# Patient Record
Sex: Male | Born: 1960
Health system: Southern US, Community
[De-identification: ages and names within clinical notes are randomized; demographics above are authoritative.]

## PROBLEM LIST (undated history)

## (undated) DIAGNOSIS — M1990 Unspecified osteoarthritis, unspecified site: Secondary | ICD-10-CM

## (undated) DIAGNOSIS — L723 Sebaceous cyst: Secondary | ICD-10-CM

## (undated) DIAGNOSIS — E785 Hyperlipidemia, unspecified: Secondary | ICD-10-CM

## (undated) DIAGNOSIS — R5383 Other fatigue: Secondary | ICD-10-CM

## (undated) DIAGNOSIS — M159 Polyosteoarthritis, unspecified: Secondary | ICD-10-CM

## (undated) DIAGNOSIS — K759 Inflammatory liver disease, unspecified: Secondary | ICD-10-CM

## (undated) DIAGNOSIS — F17200 Nicotine dependence, unspecified, uncomplicated: Secondary | ICD-10-CM

## (undated) DIAGNOSIS — I10 Essential (primary) hypertension: Secondary | ICD-10-CM

## (undated) DIAGNOSIS — R7303 Prediabetes: Secondary | ICD-10-CM

## (undated) DIAGNOSIS — Q790 Congenital diaphragmatic hernia: Secondary | ICD-10-CM

## (undated) DIAGNOSIS — L309 Dermatitis, unspecified: Secondary | ICD-10-CM

## (undated) HISTORY — DX: Congenital diaphragmatic hernia: Q79.0

## (undated) HISTORY — DX: Polyosteoarthritis, unspecified: M15.9

## (undated) HISTORY — DX: Nicotine dependence, unspecified, uncomplicated: F17.200

## (undated) HISTORY — DX: Sebaceous cyst: L72.3

## (undated) HISTORY — DX: Other fatigue: R53.83

## (undated) HISTORY — DX: Unspecified osteoarthritis, unspecified site: M19.90

## (undated) HISTORY — DX: Dermatitis, unspecified: L30.9

## (undated) HISTORY — PX: COLONOSCOPY: SHX174

## (undated) HISTORY — DX: Hyperlipidemia, unspecified: E78.5

## (undated) HISTORY — DX: Essential (primary) hypertension: I10

## (undated) HISTORY — PX: OTHER SURGICAL HISTORY: SHX169

---

## 2010-05-18 ENCOUNTER — Emergency Department (HOSPITAL_COMMUNITY)
Admission: EM | Admit: 2010-05-18 | Discharge: 2010-05-18 | Payer: Self-pay | Source: Home / Self Care | Admitting: Emergency Medicine

## 2010-08-06 LAB — RAPID URINE DRUG SCREEN, HOSP PERFORMED
Amphetamines: NOT DETECTED
Cocaine: POSITIVE — AB
Opiates: POSITIVE — AB

## 2010-08-06 LAB — COMPREHENSIVE METABOLIC PANEL
AST: 30 U/L (ref 0–37)
Albumin: 4.6 g/dL (ref 3.5–5.2)
Alkaline Phosphatase: 65 U/L (ref 39–117)
Chloride: 105 mEq/L (ref 96–112)
Creatinine, Ser: 1.37 mg/dL (ref 0.4–1.5)
Sodium: 137 mEq/L (ref 135–145)
Total Bilirubin: 0.6 mg/dL (ref 0.3–1.2)

## 2010-08-06 LAB — DIFFERENTIAL
Eosinophils Relative: 0 % (ref 0–5)
Lymphocytes Relative: 4 % — ABNORMAL LOW (ref 12–46)
Monocytes Absolute: 1.3 10*3/uL — ABNORMAL HIGH (ref 0.1–1.0)
Monocytes Relative: 6 % (ref 3–12)
Neutrophils Relative %: 90 % — ABNORMAL HIGH (ref 43–77)

## 2010-08-06 LAB — URINALYSIS, ROUTINE W REFLEX MICROSCOPIC
Bilirubin Urine: NEGATIVE
Glucose, UA: NEGATIVE mg/dL
Protein, ur: NEGATIVE mg/dL
Specific Gravity, Urine: 1.019 (ref 1.005–1.030)
Urobilinogen, UA: 0.2 mg/dL (ref 0.0–1.0)
pH: 5.5 (ref 5.0–8.0)

## 2010-08-06 LAB — CBC
HCT: 44.8 % (ref 39.0–52.0)
Hemoglobin: 15.9 g/dL (ref 13.0–17.0)
MCH: 31.6 pg (ref 26.0–34.0)
Platelets: 296 10*3/uL (ref 150–400)
RDW: 13.2 % (ref 11.5–15.5)

## 2011-11-21 ENCOUNTER — Emergency Department: Payer: Self-pay | Admitting: Emergency Medicine

## 2012-10-30 ENCOUNTER — Other Ambulatory Visit: Payer: Self-pay

## 2012-10-30 ENCOUNTER — Ambulatory Visit
Admission: RE | Admit: 2012-10-30 | Discharge: 2012-10-30 | Disposition: A | Discharge status: Home / Self Care | Payer: BC Managed Care – PPO | Source: Ambulatory Visit

## 2012-10-30 DIAGNOSIS — M25511 Pain in right shoulder: Secondary | ICD-10-CM

## 2013-01-18 ENCOUNTER — Other Ambulatory Visit: Payer: Self-pay | Admitting: Orthopedic Surgery

## 2013-01-19 ENCOUNTER — Encounter (HOSPITAL_COMMUNITY): Payer: Self-pay | Admitting: Pharmacy Technician

## 2013-01-22 ENCOUNTER — Other Ambulatory Visit: Payer: Self-pay | Admitting: Orthopedic Surgery

## 2013-01-22 NOTE — Patient Instructions (Signed)
TAOS TAPP  01/22/2013   Your procedure is scheduled on:  01/29/13              Surgery 100pm-230pm  Report to [facility] at    1030  AM.  Call this number if you have problems the morning of surgery: [phone]   Remember:   Do not eat food after midnite.  May have clear liquids until 0630am then npo.   Take these medicines the morning of surgery with A SIP OF WATER:    Do not wear jewelry,   Do not wear lotions, powders, or perfumes.  Men may shave face and neck.  Do not bring valuables to the hospital.  Contacts, dentures or bridgework may not be worn into surgery.  Leave suitcase in the car. After surgery it may be brought to your room.  For patients admitted to the hospital, checkout time is 11:00 AM the day of  discharge.     SEE CHG INSTRUCTION SHEET    Please read over the following fact sheets that you were given:  coughing and deep breathing exercises, leg exercises, Incentive Spirometry Fact Sheet                Failure to comply with these instructions may result in cancellation of your surgery.                Patient Signature ____________________________              Nurse Signature _____________________________

## 2013-01-22 NOTE — H&P (Signed)
Michael [name] is an 52 y.o. male.   Chief Complaint: right shoulder pain HPI: Right shoulder pain in RHD 52y/o male x 12 weeks s/p injury. Michael injury resulted from a direct blow (when [name] was tubbing Michael [name] flipped and struck a rock against his shoulder) that occurred during recreational activities. Michael onset of symptoms was sudden beginning immediately after Michael injury. Michael [name] reports symptoms which include shoulder pain, shoulder stiffness, decreased range of motion, night pain, inability to lay on that side and weakness, while reported symptoms do not include numbness, numbness and tingling in fingers or neck pain. Michael [name] reports symptoms that radiate to Michael right upper arm. Michael [name] describes these symptoms as moderate in severity and unchanged. Symptoms are exacerbated by motion at Michael shoulder and lifting. Symptoms are relieved by nonsteroidal anti-inflammatory drugs (slight relief with OTC NSAIDs), while symptoms are not relieved by restricted activity, non-opioid analgesics (ultram) or opioid analgesics. Current treatment includes nonsteroidal anti-inflammatory drugs. Michael [name] had his MRI which indicates a full thickness retracted tear of Michael supraspinatus and infraspinatus, complete tear of Michael biceps with retraction, mild AC arthrosis. Michael [name] reports no problems with his shoulder prior to this significant injury at Michael time. 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  No past medical history on file.  No past surgical history on file.  No family history on file. Social History:  has no tobacco, alcohol, and drug history on file.  Allergies: No Known Allergies   (Not in a hospital admission)  No results found for this or any previous visit (from Michael past 48 hour(s)). No results found.  Review of Systems  Constitutional: Negative.   HENT: Negative.   Eyes: Negative.   Respiratory: Negative.   Cardiovascular: Negative.   Gastrointestinal: Negative.   Genitourinary: Negative.   Musculoskeletal:  Positive for joint pain.  Skin: Negative.   Neurological: Negative.   Endo/Heme/Allergies: Negative.   Psychiatric/Behavioral: Negative.     There were no vitals taken for this visit. Physical Exam  Constitutional: Michael [name] is oriented to person, place, and time. Michael [name] appears well-developed and well-nourished.  HENT:  Head: Normocephalic and atraumatic.  Eyes: Conjunctivae and EOM are normal. Pupils are equal, round, and reactive to light.  Neck: Normal range of motion. Neck supple.  Cardiovascular: Normal rate and regular rhythm.   Respiratory: Effort normal and breath sounds normal.  GI: Soft. Bowel sounds are normal.  Musculoskeletal:  On exam, Michael [name] is in severe pain with attempted ROM of Michael shoulders. Abduction is to 80 degrees. Michael [name] is weak in external rotation and abduction. Michael [name] is tender in Michael anterior subacromial region, nontender over Michael AC. Biceps prominence is noted.  Neurological: Michael [name] is alert and oriented to person, place, and time. Michael [name] has normal reflexes.  Skin: Skin is warm and dry.  Psychiatric: Michael [name] has a normal mood and affect.    MRI demonstrates full thickness retracted tears to Michael level of Michael glenoid. There is no fatty atrophy. There is mild infraspinatus atrophy, though.  Assessment/Plan 1. Subacute retracted tear of Michael rotator cuff, minimal atrophy, no fat atrophy. 2. Long head of Michael biceps tendon with retraction and deformity of Michael right biceps.  I had an extensive, exhaustive discussion with Michael [name] concerning current pathology, relevant anatomy and treatment options. At his age of 51 and with his symptomatology, it would be wise to proceed with repair of Michael rotator cuff; however, we did discusss Michael possibility with Michael retracted tear being 6 weeks out, it may not be   reparable or only partially reparable. We discussed Michael sequelae associated with that, residual limitations and disability and Michael protracted recovery up to 5 months and how that  implicates him in his job. Michael [name] also needs to quit smoking indicating Michael side effects of infection, delayed healing, nonhealing, re-tear. Michael [name] will contemplate his options and will proceed accordingly. There is nothing to do, I discussed, with Michael biceps tendon at this point. I may debride Michael stump.  I had a long discussion with Michael [name] concerning Michael risks and benefits of a rotator cuff repair, including bleeding, infection, prolonged postoperative recovery, which may require 3 to 5 months until maximum medical improvement. Overight procedure with initiation of early passive range of motion within physical therapy. Avoid any active motion for Michael first six weeks. This is all in an effort to avoid recurrent tear of Michael rotator cuff and adhesive capsulitis. Return to work without use of Michael arm can be obtained following two weeks. However, driving will be a challenge. We also discussed Michael possibility of requiring implants including bone anchors,as well as an Allograft patch graft if a massive rotator cuff tear is encountered. Removal of any bones for spurs as well as bursitis will be performed during Michael procedure and also any associated anesthetic complications as well.  Plan right shoulder mini-open RCR, possible patch graft  Blakelyn Dinges M.  For Dr. Beane  01/22/2013, 8:48 AM    

## 2013-01-26 ENCOUNTER — Encounter (HOSPITAL_COMMUNITY)
Admission: RE | Admit: 2013-01-26 | Discharge: 2013-01-26 | Disposition: A | Discharge status: Home / Self Care | Payer: BC Managed Care – PPO | Source: Ambulatory Visit | Attending: Specialist | Admitting: Specialist

## 2013-01-26 ENCOUNTER — Ambulatory Visit (HOSPITAL_COMMUNITY)
Admission: RE | Admit: 2013-01-26 | Discharge: 2013-01-26 | Disposition: A | Discharge status: Home / Self Care | Payer: BC Managed Care – PPO | Source: Ambulatory Visit | Attending: Specialist | Admitting: Specialist

## 2013-01-26 ENCOUNTER — Encounter (HOSPITAL_COMMUNITY): Payer: Self-pay

## 2013-01-26 DIAGNOSIS — I10 Essential (primary) hypertension: Secondary | ICD-10-CM | POA: Insufficient documentation

## 2013-01-26 DIAGNOSIS — Z01812 Encounter for preprocedural laboratory examination: Secondary | ICD-10-CM | POA: Insufficient documentation

## 2013-01-26 DIAGNOSIS — Z01810 Encounter for preprocedural cardiovascular examination: Secondary | ICD-10-CM | POA: Insufficient documentation

## 2013-01-26 DIAGNOSIS — Z01818 Encounter for other preprocedural examination: Secondary | ICD-10-CM | POA: Insufficient documentation

## 2013-01-26 HISTORY — DX: Inflammatory liver disease, unspecified: K75.9

## 2013-01-26 HISTORY — DX: Essential (primary) hypertension: I10

## 2013-01-26 LAB — CBC
HCT: 42.8 % (ref 39.0–52.0)
MCH: 31.2 pg (ref 26.0–34.0)
MCHC: 34.6 g/dL (ref 30.0–36.0)
RDW: 12.9 % (ref 11.5–15.5)

## 2013-01-26 LAB — COMPREHENSIVE METABOLIC PANEL
Albumin: 4.2 g/dL (ref 3.5–5.2)
Alkaline Phosphatase: 82 U/L (ref 39–117)
BUN: 21 mg/dL (ref 6–23)
Calcium: 10.1 mg/dL (ref 8.4–10.5)
Potassium: 3.7 mEq/L (ref 3.5–5.1)
Total Protein: 7.7 g/dL (ref 6.0–8.3)

## 2013-01-26 NOTE — Progress Notes (Signed)
Initial blood pressure in right arm at time of preop appointment was 190/111.  Recheck of blood pressure( 20 minutes later)  was 185/110.  Blood pressure in left arm 166/110.  Recheck of blood pressure in right arm ( 40 minutes after initial blood pressure ) was 176/103.  Patient states his blood pressure has been elevated ( not under 95 diastolic per patient) and he has reported to PCP at [facility].  Patient states he has been in some pain with right arm.  Patient also reports working 3rd shift last nite and he has not been to sleep yet.  I informed patient that I would be informing his PCP of blood pressure readings.

## 2013-01-26 NOTE — Progress Notes (Signed)
Spoke with nurse at Dr Kateri Plummer office- Deboraha Sprang at [place] and informed her of blood pressure readings along with message left with receptionist and also faxed note in EPIC to office of Dr Kateri Plummer.

## 2013-01-26 NOTE — Progress Notes (Signed)
Initial blood pressure in right arm at time of preop appointment was 190/111.  Recheck of blood pressure( 20 minutes later)  was 185/110.  Blood pressure in left arm 166/110.  Recheck of blood pressure in right arm ( 40 minutes after initial blood pressure ) was 176/103.  Patient states his blood pressure has been elevated ( not under 95 diastolic per patient) and he has reported to PCP at [facility].  Patient states he has been in some pain with right arm.  Patient also reports working 3rd shift last nite and he has not been to sleep yet.  I informed patient that I would be informing his PCP of blood pressure readings.   

## 2013-01-27 NOTE — Progress Notes (Signed)
Nurse from office of Dr Cheri Guppy called and stated did not receive fax sent regarding blood pressure issue faxed on 01/26/13.  Resent fax and explained to nurse regarding elevated blood pressure on preop appointment on 01/26/13.

## 2013-01-29 ENCOUNTER — Encounter (HOSPITAL_COMMUNITY): Payer: Self-pay | Admitting: Anesthesiology

## 2013-01-29 ENCOUNTER — Encounter (HOSPITAL_COMMUNITY): Payer: Self-pay | Admitting: *Deleted

## 2013-01-29 ENCOUNTER — Encounter (HOSPITAL_COMMUNITY)
Admission: RE | Disposition: A | Discharge status: Home / Self Care | Payer: Self-pay | Source: Ambulatory Visit | Attending: Specialist

## 2013-01-29 ENCOUNTER — Ambulatory Visit (HOSPITAL_COMMUNITY)
Admission: RE | Admit: 2013-01-29 | Discharge: 2013-01-30 | Disposition: A | Discharge status: Home / Self Care | Payer: BC Managed Care – PPO | Source: Ambulatory Visit | Attending: Specialist | Admitting: Specialist

## 2013-01-29 ENCOUNTER — Ambulatory Visit (HOSPITAL_COMMUNITY): Payer: BC Managed Care – PPO | Admitting: Anesthesiology

## 2013-01-29 DIAGNOSIS — M75101 Unspecified rotator cuff tear or rupture of right shoulder, not specified as traumatic: Secondary | ICD-10-CM

## 2013-01-29 DIAGNOSIS — Y9316 Activity, rowing, canoeing, kayaking, rafting and tubing: Secondary | ICD-10-CM | POA: Insufficient documentation

## 2013-01-29 DIAGNOSIS — IMO0002 Reserved for concepts with insufficient information to code with codable children: Secondary | ICD-10-CM | POA: Insufficient documentation

## 2013-01-29 DIAGNOSIS — I10 Essential (primary) hypertension: Secondary | ICD-10-CM | POA: Insufficient documentation

## 2013-01-29 DIAGNOSIS — M19019 Primary osteoarthritis, unspecified shoulder: Secondary | ICD-10-CM | POA: Insufficient documentation

## 2013-01-29 DIAGNOSIS — F17200 Nicotine dependence, unspecified, uncomplicated: Secondary | ICD-10-CM | POA: Insufficient documentation

## 2013-01-29 DIAGNOSIS — E669 Obesity, unspecified: Secondary | ICD-10-CM | POA: Insufficient documentation

## 2013-01-29 DIAGNOSIS — S43429A Sprain of unspecified rotator cuff capsule, initial encounter: Secondary | ICD-10-CM | POA: Insufficient documentation

## 2013-01-29 HISTORY — PX: SHOULDER OPEN ROTATOR CUFF REPAIR: SHX2407

## 2013-01-29 LAB — CBC
MCH: 31.4 pg (ref 26.0–34.0)
MCV: 90.3 fL (ref 78.0–100.0)
Platelets: 242 10*3/uL (ref 150–400)
RBC: 4.23 MIL/uL (ref 4.22–5.81)
RDW: 12.8 % (ref 11.5–15.5)
WBC: 15.1 10*3/uL — ABNORMAL HIGH (ref 4.0–10.5)

## 2013-01-29 LAB — CREATININE, SERUM
Creatinine, Ser: 0.85 mg/dL (ref 0.50–1.35)
GFR calc Af Amer: >90 mL/min (ref >90)

## 2013-01-29 SURGERY — REPAIR, ROTATOR CUFF, OPEN
Anesthesia: General | Site: Shoulder | Laterality: Right | Wound class: Clean

## 2013-01-29 MED ORDER — HYDROCHLOROTHIAZIDE 25 MG PO TABS
25.0000 mg | ORAL_TABLET | Freq: Every day | ORAL | Status: DC
  Filled 2013-01-29: qty 1

## 2013-01-29 MED ORDER — CEFAZOLIN SODIUM-DEXTROSE 2-3 GM-% IV SOLR
2.0000 g | Freq: Four times a day (QID) | INTRAVENOUS | Status: AC
  Administered 2013-01-29 – 2013-01-30 (×3): 2 g via INTRAVENOUS
  Filled 2013-01-29 (×3): qty 50

## 2013-01-29 MED ORDER — MIDAZOLAM HCL 5 MG/5ML IJ SOLN
INTRAMUSCULAR | Status: DC | PRN
  Administered 2013-01-29: 2 mg via INTRAVENOUS

## 2013-01-29 MED ORDER — LISINOPRIL 20 MG PO TABS
20.0000 mg | ORAL_TABLET | Freq: Every day | ORAL | Status: DC
  Filled 2013-01-29: qty 1

## 2013-01-29 MED ORDER — NEOSTIGMINE METHYLSULFATE 1 MG/ML IJ SOLN
INTRAMUSCULAR | Status: DC | PRN
  Administered 2013-01-29: 4 mg via INTRAVENOUS

## 2013-01-29 MED ORDER — SUCCINYLCHOLINE CHLORIDE 20 MG/ML IJ SOLN
INTRAMUSCULAR | Status: DC | PRN
  Administered 2013-01-29: 100 mg via INTRAVENOUS

## 2013-01-29 MED ORDER — BUPIVACAINE-EPINEPHRINE 0.5% -1:200000 IJ SOLN
INTRAMUSCULAR | Status: DC | PRN
  Administered 2013-01-29: 20 mL

## 2013-01-29 MED ORDER — ONDANSETRON HCL 4 MG PO TABS: 4.0000 mg | ORAL_TABLET | Freq: Four times a day (QID) | ORAL | Status: DC | PRN

## 2013-01-29 MED ORDER — SODIUM CHLORIDE 0.9 % IR SOLN
Status: DC | PRN
  Administered 2013-01-29: 14:00:00

## 2013-01-29 MED ORDER — ENOXAPARIN SODIUM 40 MG/0.4ML ~~LOC~~ SOLN
40.0000 mg | SUBCUTANEOUS | Status: DC
  Administered 2013-01-30: 40 mg via SUBCUTANEOUS
  Filled 2013-01-29 (×2): qty 0.4

## 2013-01-29 MED ORDER — CEFAZOLIN SODIUM-DEXTROSE 2-3 GM-% IV SOLR
INTRAVENOUS | Status: AC
  Filled 2013-01-29: qty 50

## 2013-01-29 MED ORDER — CEFAZOLIN SODIUM-DEXTROSE 2-3 GM-% IV SOLR
2.0000 g | INTRAVENOUS | Status: AC
  Administered 2013-01-29: 2 g via INTRAVENOUS

## 2013-01-29 MED ORDER — BUPIVACAINE-EPINEPHRINE 0.5% -1:200000 IJ SOLN
INTRAMUSCULAR | Status: AC
  Filled 2013-01-29: qty 1

## 2013-01-29 MED ORDER — OXYCODONE HCL 5 MG PO TABS: ORAL_TABLET | ORAL | Status: DC

## 2013-01-29 MED ORDER — KETOROLAC TROMETHAMINE 30 MG/ML IJ SOLN
30.0000 mg | Freq: Four times a day (QID) | INTRAMUSCULAR | Status: DC
  Administered 2013-01-29 – 2013-01-30 (×3): 30 mg via INTRAVENOUS
  Filled 2013-01-29 (×6): qty 1

## 2013-01-29 MED ORDER — MENTHOL 3 MG MT LOZG: 1.0000 | LOZENGE | OROMUCOSAL | Status: DC | PRN

## 2013-01-29 MED ORDER — HYDROMORPHONE HCL PF 1 MG/ML IJ SOLN: 0.5000 mg | INTRAMUSCULAR | Status: DC | PRN

## 2013-01-29 MED ORDER — DOCUSATE SODIUM 100 MG PO CAPS
100.0000 mg | ORAL_CAPSULE | Freq: Two times a day (BID) | ORAL | Status: DC
  Administered 2013-01-29: 23:00:00 100 mg via ORAL

## 2013-01-29 MED ORDER — LISINOPRIL-HYDROCHLOROTHIAZIDE 20-25 MG PO TABS: 1.0000 | ORAL_TABLET | Freq: Every evening | ORAL | Status: DC

## 2013-01-29 MED ORDER — HYDROMORPHONE HCL PF 1 MG/ML IJ SOLN
0.2500 mg | INTRAMUSCULAR | Status: DC | PRN
  Administered 2013-01-29 (×4): 0.5 mg via INTRAVENOUS

## 2013-01-29 MED ORDER — OXYCODONE HCL 5 MG PO TABS
5.0000 mg | ORAL_TABLET | ORAL | Status: DC | PRN
  Administered 2013-01-29 – 2013-01-30 (×3): 10 mg via ORAL
  Filled 2013-01-29 (×3): qty 2

## 2013-01-29 MED ORDER — ONDANSETRON HCL 4 MG/2ML IJ SOLN: 4.0000 mg | Freq: Four times a day (QID) | INTRAMUSCULAR | Status: DC | PRN

## 2013-01-29 MED ORDER — LACTATED RINGERS IV SOLN: INTRAVENOUS | Status: DC

## 2013-01-29 MED ORDER — METHOCARBAMOL 500 MG PO TABS: 500.0000 mg | ORAL_TABLET | Freq: Three times a day (TID) | ORAL | Status: DC | PRN

## 2013-01-29 MED ORDER — LACTATED RINGERS IV SOLN
INTRAVENOUS | Status: DC
  Administered 2013-01-29 (×3): via INTRAVENOUS

## 2013-01-29 MED ORDER — KETOROLAC TROMETHAMINE 30 MG/ML IJ SOLN
INTRAMUSCULAR | Status: AC
  Filled 2013-01-29: qty 1

## 2013-01-29 MED ORDER — METHOCARBAMOL 100 MG/ML IJ SOLN
500.0000 mg | Freq: Four times a day (QID) | INTRAVENOUS | Status: DC | PRN
  Administered 2013-01-29: 500 mg via INTRAVENOUS
  Filled 2013-01-29: qty 5

## 2013-01-29 MED ORDER — GLYCOPYRROLATE 0.2 MG/ML IJ SOLN
INTRAMUSCULAR | Status: DC | PRN
  Administered 2013-01-29: 0.6 mg via INTRAVENOUS

## 2013-01-29 MED ORDER — SODIUM CHLORIDE 0.45 % IV SOLN
INTRAVENOUS | Status: DC
  Administered 2013-01-29: 19:00:00 50 mL/h via INTRAVENOUS

## 2013-01-29 MED ORDER — HYDROMORPHONE HCL PF 1 MG/ML IJ SOLN
1.0000 mg | INTRAMUSCULAR | Status: DC | PRN
  Administered 2013-01-29 – 2013-01-30 (×2): 1 mg via INTRAVENOUS
  Filled 2013-01-29 (×2): qty 1

## 2013-01-29 MED ORDER — PHENOL 1.4 % MT LIQD: 1.0000 | OROMUCOSAL | Status: DC | PRN

## 2013-01-29 MED ORDER — CHLORHEXIDINE GLUCONATE 4 % EX LIQD
60.0000 mL | Freq: Once | CUTANEOUS | Status: DC
  Filled 2013-01-29: qty 60

## 2013-01-29 MED ORDER — METHOCARBAMOL 500 MG PO TABS
500.0000 mg | ORAL_TABLET | Freq: Four times a day (QID) | ORAL | Status: DC | PRN
  Administered 2013-01-29: 500 mg via ORAL
  Filled 2013-01-29: qty 1

## 2013-01-29 MED ORDER — ACETAMINOPHEN 650 MG RE SUPP: 650.0000 mg | Freq: Four times a day (QID) | RECTAL | Status: DC | PRN

## 2013-01-29 MED ORDER — FENTANYL CITRATE 0.05 MG/ML IJ SOLN
INTRAMUSCULAR | Status: AC
  Filled 2013-01-29: qty 2

## 2013-01-29 MED ORDER — HYDROMORPHONE HCL PF 1 MG/ML IJ SOLN
INTRAMUSCULAR | Status: AC
  Filled 2013-01-29: qty 1

## 2013-01-29 MED ORDER — FENTANYL CITRATE 0.05 MG/ML IJ SOLN
25.0000 ug | INTRAMUSCULAR | Status: DC | PRN
  Administered 2013-01-29 (×2): 50 ug via INTRAVENOUS

## 2013-01-29 MED ORDER — PROPOFOL 10 MG/ML IV BOLUS
INTRAVENOUS | Status: DC | PRN
  Administered 2013-01-29: 200 mg via INTRAVENOUS

## 2013-01-29 MED ORDER — CISATRACURIUM BESYLATE (PF) 10 MG/5ML IV SOLN
INTRAVENOUS | Status: DC | PRN
  Administered 2013-01-29 (×3): 4 mg via INTRAVENOUS

## 2013-01-29 MED ORDER — ACETAMINOPHEN 325 MG PO TABS: 650.0000 mg | ORAL_TABLET | Freq: Four times a day (QID) | ORAL | Status: DC | PRN

## 2013-01-29 MED ORDER — METOCLOPRAMIDE HCL 5 MG/ML IJ SOLN: 5.0000 mg | Freq: Three times a day (TID) | INTRAMUSCULAR | Status: DC | PRN

## 2013-01-29 MED ORDER — ONDANSETRON HCL 4 MG/2ML IJ SOLN
INTRAMUSCULAR | Status: DC | PRN
  Administered 2013-01-29: 4 mg via INTRAVENOUS

## 2013-01-29 MED ORDER — METOCLOPRAMIDE HCL 10 MG PO TABS: 5.0000 mg | ORAL_TABLET | Freq: Three times a day (TID) | ORAL | Status: DC | PRN

## 2013-01-29 MED ORDER — PROMETHAZINE HCL 25 MG/ML IJ SOLN: 6.2500 mg | INTRAMUSCULAR | Status: DC | PRN

## 2013-01-29 MED ORDER — FENTANYL CITRATE 0.05 MG/ML IJ SOLN
INTRAMUSCULAR | Status: DC | PRN
  Administered 2013-01-29: 100 ug via INTRAVENOUS
  Administered 2013-01-29 (×3): 50 ug via INTRAVENOUS

## 2013-01-29 MED ORDER — HYDROMORPHONE HCL PF 1 MG/ML IJ SOLN
INTRAMUSCULAR | Status: AC
  Administered 2013-01-30: 1 mg via INTRAVENOUS
  Filled 2013-01-29: qty 1

## 2013-01-29 MED ORDER — AMLODIPINE BESYLATE 5 MG PO TABS
5.0000 mg | ORAL_TABLET | Freq: Every day | ORAL | Status: DC
  Filled 2013-01-29: qty 1

## 2013-01-29 SURGICAL SUPPLY — 53 items
ANCH SUT SWLK 19.1X4.75 VT (Anchor) ×4 IMPLANT
ANCHOR PEEK 4.75X19.1 SWLK C (Anchor) ×4 IMPLANT
APL SKNCLS STERI-STRIP NONHPOA (GAUZE/BANDAGES/DRESSINGS)
BAG SPEC THK2 15X12 ZIP CLS (MISCELLANEOUS)
BAG ZIPLOCK 12X15 (MISCELLANEOUS) ×1 IMPLANT
BENZOIN TINCTURE PRP APPL 2/3 (GAUZE/BANDAGES/DRESSINGS) ×1 IMPLANT
BLADE OSCILLATING/SAGITTAL (BLADE) ×2
BLADE SW THK.38XMED LNG THN (BLADE) ×1 IMPLANT
BUR OVAL CARBIDE 4.0 (BURR) ×2 IMPLANT
CHLORAPREP W/TINT 26ML (MISCELLANEOUS) IMPLANT
CLEANER TIP ELECTROSURG 2X2 (MISCELLANEOUS) ×2 IMPLANT
CLOTH 2% CHLOROHEXIDINE 3PK (PERSONAL CARE ITEMS) ×2 IMPLANT
CLOTH BEACON ORANGE TIMEOUT ST (SAFETY) ×2 IMPLANT
DECANTER SPIKE VIAL GLASS SM (MISCELLANEOUS) ×2 IMPLANT
DRAPE ORTHO SPLIT 77X108 STRL (DRAPES) ×2
DRAPE POUCH INSTRU U-SHP 10X18 (DRAPES) ×2 IMPLANT
DRAPE SURG ORHT 6 SPLT 77X108 (DRAPES) ×1 IMPLANT
DRSG AQUACEL AG ADV 3.5X 4 (GAUZE/BANDAGES/DRESSINGS) ×1 IMPLANT
DRSG AQUACEL AG ADV 3.5X 6 (GAUZE/BANDAGES/DRESSINGS) IMPLANT
DURAPREP 26ML APPLICATOR (WOUND CARE) ×2 IMPLANT
ELECT NDL TIP 2.8 STRL (NEEDLE) ×1 IMPLANT
ELECT NEEDLE TIP 2.8 STRL (NEEDLE) ×2 IMPLANT
ELECT REM PT RETURN 9FT ADLT (ELECTROSURGICAL) ×2
ELECTRODE REM PT RTRN 9FT ADLT (ELECTROSURGICAL) ×1 IMPLANT
GLOVE BIOGEL PI IND STRL 7.5 (GLOVE) ×1 IMPLANT
GLOVE BIOGEL PI IND STRL 8 (GLOVE) ×1 IMPLANT
GLOVE BIOGEL PI INDICATOR 7.5 (GLOVE) ×1
GLOVE BIOGEL PI INDICATOR 8 (GLOVE) ×1
GLOVE SURG SS PI 7.5 STRL IVOR (GLOVE) ×2 IMPLANT
GLOVE SURG SS PI 8.0 STRL IVOR (GLOVE) ×2 IMPLANT
GOWN STRL REIN XL XLG (GOWN DISPOSABLE) ×4 IMPLANT
KIT BASIN OR (CUSTOM PROCEDURE TRAY) ×2 IMPLANT
KIT POSITION SHOULDER SCHLEI (MISCELLANEOUS) ×1 IMPLANT
MANIFOLD NEPTUNE II (INSTRUMENTS) ×2 IMPLANT
NDL MA TROC 1/2 CIR (NEEDLE) IMPLANT
NDL SCORPION MULTI FIRE (NEEDLE) IMPLANT
NEEDLE MA TROC 1/2 CIR (NEEDLE) ×2 IMPLANT
NEEDLE SCORPION MULTI FIRE (NEEDLE) ×2 IMPLANT
PACK SHOULDER CUSTOM OPM052 (CUSTOM PROCEDURE TRAY) ×2 IMPLANT
SLING ARM IMMOBILIZER LRG (SOFTGOODS) ×2 IMPLANT
SLING ULTRA II S (ORTHOPEDIC SUPPLIES) ×1 IMPLANT
SPONGE LAP 4X18 X RAY DECT (DISPOSABLE) IMPLANT
STRIP CLOSURE SKIN 1/2X4 (GAUZE/BANDAGES/DRESSINGS) ×1 IMPLANT
SUT BONE WAX W31G (SUTURE) ×2 IMPLANT
SUT ETHIBOND 0 (SUTURE) IMPLANT
SUT ETHIBOND 2 OS 4 DA (SUTURE) ×1 IMPLANT
SUT PROLENE 3 0 PS 2 (SUTURE) ×1 IMPLANT
SUT TIGER TAPE 7 IN WHITE (SUTURE) ×1 IMPLANT
SUT VIC AB 1-0 CT2 27 (SUTURE) ×3 IMPLANT
SUT VIC AB 2-0 CT2 27 (SUTURE) ×2 IMPLANT
SUT VICRYL 0 UR6 27IN ABS (SUTURE) IMPLANT
SUT VICRYL 0-0 OS 2 NEEDLE (SUTURE) ×1 IMPLANT
TAPE FIBER 2MM 7IN #2 BLUE (SUTURE) ×1 IMPLANT

## 2013-01-29 NOTE — Interval H&P Note (Signed)
History and Physical Interval Note:  01/29/2013 1:43 PM  Michael [name]  has presented today for surgery, with the diagnosis of RIGHT SHOULDER ROTATOR CUFF TEAR  The various methods of treatment have been discussed with the patient and family. After consideration of risks, benefits and other options for treatment, the patient has consented to  Procedure(s): RIGHT SHOULDER MINI ROTATOR CUFF REPAIR (Right) as a surgical intervention .  The patient's history has been reviewed, patient examined, no change in status, stable for surgery.  I have reviewed the patient's chart and labs.  Questions were answered to the patient's satisfaction.     Tilman Mcclaren C

## 2013-01-29 NOTE — Preoperative (Signed)
Beta Blockers   Reason not to administer Beta Blockers:Not Applicable 

## 2013-01-29 NOTE — H&P (View-Only) (Signed)
Michael [name] is an 52 y.o. male.   Chief Complaint: right shoulder pain HPI: Right shoulder pain in RHD 52y/o male x 12 weeks s/p injury. The injury resulted from a direct blow (when patient was tubbing he flipped and struck a rock against his shoulder) that occurred during recreational activities. The onset of symptoms was sudden beginning immediately after the injury. The patient reports symptoms which include shoulder pain, shoulder stiffness, decreased range of motion, night pain, inability to lay on that side and weakness, while reported symptoms do not include numbness, numbness and tingling in fingers or neck pain. The patient reports symptoms that radiate to the right upper arm. The patient describes these symptoms as moderate in severity and unchanged. Symptoms are exacerbated by motion at the shoulder and lifting. Symptoms are relieved by nonsteroidal anti-inflammatory drugs (slight relief with OTC NSAIDs), while symptoms are not relieved by restricted activity, non-opioid analgesics (ultram) or opioid analgesics. Current treatment includes nonsteroidal anti-inflammatory drugs. He had his MRI which indicates a full thickness retracted tear of the supraspinatus and infraspinatus, complete tear of the biceps with retraction, mild AC arthrosis. He reports no problems with his shoulder prior to this significant injury at the time. 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  No past medical history on file.  No past surgical history on file.  No family history on file. Social History:  has no tobacco, alcohol, and drug history on file.  Allergies: No Known Allergies   (Not in a hospital admission)  No results found for this or any previous visit (from the past 48 hour(s)). No results found.  Review of Systems  Constitutional: Negative.   HENT: Negative.   Eyes: Negative.   Respiratory: Negative.   Cardiovascular: Negative.   Gastrointestinal: Negative.   Genitourinary: Negative.   Musculoskeletal:  Positive for joint pain.  Skin: Negative.   Neurological: Negative.   Endo/Heme/Allergies: Negative.   Psychiatric/Behavioral: Negative.     There were no vitals taken for this visit. Physical Exam  Constitutional: He is oriented to person, place, and time. He appears well-developed and well-nourished.  HENT:  Head: Normocephalic and atraumatic.  Eyes: Conjunctivae and EOM are normal. Pupils are equal, round, and reactive to light.  Neck: Normal range of motion. Neck supple.  Cardiovascular: Normal rate and regular rhythm.   Respiratory: Effort normal and breath sounds normal.  GI: Soft. Bowel sounds are normal.  Musculoskeletal:  On exam, he is in severe pain with attempted ROM of the shoulders. Abduction is to 80 degrees. He is weak in external rotation and abduction. He is tender in the anterior subacromial region, nontender over the [facility]. Biceps prominence is noted.  Neurological: He is alert and oriented to person, place, and time. He has normal reflexes.  Skin: Skin is warm and dry.  Psychiatric: He has a normal mood and affect.    MRI demonstrates full thickness retracted tears to the level of the glenoid. There is no fatty atrophy. There is mild infraspinatus atrophy, though.  Assessment/Plan 1. Subacute retracted tear of the rotator cuff, minimal atrophy, no fat atrophy. 2. Long head of the biceps tendon with retraction and deformity of the right biceps.  I had an extensive, exhaustive discussion with Michael [name] concerning current pathology, relevant anatomy and treatment options. At his age of 52 and with his symptomatology, it would be wise to proceed with repair of the rotator cuff; however, we did discusss the possibility with the retracted tear being 6 weeks out, it may not be  reparable or only partially reparable. We discussed the sequelae associated with that, residual limitations and disability and the protracted recovery up to 5 months and how that  implicates him in his job. He also needs to quit smoking indicating the side effects of infection, delayed healing, nonhealing, re-tear. He will contemplate his options and will proceed accordingly. There is nothing to do, I discussed, with the biceps tendon at this point. I may debride the stump.  I had a long discussion with the patient concerning the risks and benefits of a rotator cuff repair, including bleeding, infection, prolonged postoperative recovery, which may require 3 to 5 months until maximum medical improvement. Overight procedure with initiation of early passive range of motion within physical therapy. Avoid any active motion for the first six weeks. This is all in an effort to avoid recurrent tear of the rotator cuff and adhesive capsulitis. Return to work without use of the arm can be obtained following two weeks. However, driving will be a challenge. We also discussed the possibility of requiring implants including bone anchors,as well as an Allograft patch graft if a massive rotator cuff tear is encountered. Removal of any bones for spurs as well as bursitis will be performed during the procedure and also any associated anesthetic complications as well.  Plan right shoulder mini-open RCR, possible patch graft  Michael [name] M.  For Dr. Shelle Iron  01/22/2013, 8:48 AM

## 2013-01-29 NOTE — Anesthesia Preprocedure Evaluation (Addendum)
Anesthesia Evaluation  Patient identified by MRN, date of birth, ID band Patient awake    Reviewed: Allergy & Precautions, H&P , NPO status , Patient's Chart, lab work & pertinent test results  Airway Mallampati: II TM Distance: >3 FB Neck ROM: Full    Dental no notable dental hx.    Pulmonary Current Smoker,  breath sounds clear to auscultation  Pulmonary exam normal       Cardiovascular Exercise Tolerance: Good hypertension, Pt. on medications Rhythm:Regular Rate:Normal     Neuro/Psych negative neurological ROS  negative psych ROS   GI/Hepatic (+) Hepatitis -, BS/p interferon. No current liver problems. LFT normal.   Endo/Other  negative endocrine ROS  Renal/GU negative Renal ROS  negative genitourinary   Musculoskeletal negative musculoskeletal ROS (+)   Abdominal (+) + obese,   Peds negative pediatric ROS (+)  Hematology negative hematology ROS (+)   Anesthesia Other Findings   Reproductive/Obstetrics negative OB ROS                          Anesthesia Physical Anesthesia Plan  ASA: II  Anesthesia Plan: General   Post-op Pain Management:    Induction: Intravenous  Airway Management Planned: Oral ETT  Additional Equipment:   Intra-op Plan:   Post-operative Plan: Extubation in OR  Informed Consent: I have reviewed the patients History and Physical, chart, labs and discussed the procedure including the risks, benefits and alternatives for the proposed anesthesia with the patient or authorized representative who has indicated his/her understanding and acceptance.   Dental advisory given  Plan Discussed with: CRNA  Anesthesia Plan Comments:         Anesthesia Quick Evaluation

## 2013-01-29 NOTE — Anesthesia Postprocedure Evaluation (Signed)
  Anesthesia Post-op Note  Patient: Michael [name]  Procedure(s) Performed: Procedure(s) (LRB): RIGHT SHOULDER MINI ROTATOR CUFF REPAIR (Right)  Patient Location: PACU  Anesthesia Type: General  Level of Consciousness: awake and alert   Airway and Oxygen Therapy: Patient Spontanous Breathing  Post-op Pain: mild  Post-op Assessment: Post-op Vital signs reviewed, Patient's Cardiovascular Status Stable, Respiratory Function Stable, Patent Airway and No signs of Nausea or vomiting  Last Vitals:  Filed Vitals:   01/29/13 1700  BP: 130/97  Pulse: 67  Temp:   Resp: 11    Post-op Vital Signs: stable   Complications: No apparent anesthesia complications. Pain 5/10 after dilauded and fentanyl.

## 2013-01-29 NOTE — Plan of Care (Signed)
Problem: Diagnosis - Type of Surgery Goal: General Surgical Patient Education (See Patient Education module for education specifics) Right rotator cuff     

## 2013-01-29 NOTE — Transfer of Care (Signed)
Immediate Anesthesia Transfer of Care Note  Patient: Michael [name]  Procedure(s) Performed: Procedure(s): RIGHT SHOULDER MINI ROTATOR CUFF REPAIR (Right)  Patient Location: PACU  Anesthesia Type:General  Level of Consciousness: awake, alert  and oriented  Airway & Oxygen Therapy: Patient Spontanous Breathing and Patient connected to face mask oxygen  Post-op Assessment: Report given to PACU RN and Post -op Vital signs reviewed and stable  Post vital signs: Reviewed and stable  Complications: No apparent anesthesia complications

## 2013-01-29 NOTE — Brief Op Note (Signed)
01/29/2013  3:53 PM  PATIENT:  Michael [name]  52 y.o. male  PRE-OPERATIVE DIAGNOSIS:  RIGHT SHOULDER ROTATOR CUFF TEAR  POST-OPERATIVE DIAGNOSIS:  RIGHT SHOULDER ROTATOR CUFF TEAR  PROCEDURE:  Procedure(s): RIGHT SHOULDER MINI ROTATOR CUFF REPAIR (Right)  SURGEON:  Surgeon(s) and Role:    * Javier Docker, MD - Primary  PHYSICIAN ASSISTANT:   ASSISTANTS: Bissell   ANESTHESIA:   general  EBL:     BLOOD ADMINISTERED:none  DRAINS: none   LOCAL MEDICATIONS USED:  MARCAINE     SPECIMEN:  No Specimen  DISPOSITION OF SPECIMEN:  N/A  COUNTS:  YES  TOURNIQUET:  * No tourniquets in log *  DICTATION: .Other Dictation: Dictation Number [id number]  PLAN OF CARE: Admit for overnight observation  PATIENT DISPOSITION:  PACU - hemodynamically stable.   Delay start of Pharmacological VTE agent (>24hrs) due to surgical blood loss or risk of bleeding: no

## 2013-01-30 NOTE — Progress Notes (Signed)
   Subjective: 1 Day Post-Op Procedure(s) (LRB): RIGHT SHOULDER MINI ROTATOR CUFF REPAIR (Right)  Pt c/o moderate pain to right shoulder today but pain medication is effective Ready for d/c home Patient reports pain as moderate.  Objective:   VITALS:   Filed Vitals:   01/30/13 0641  BP: 167/85  Pulse: 74  Temp: 98.2 F (36.8 C)  Resp: 16    Right shoulder incision dressing in good position Sling in place Nv intact distally No rashes or edema  LABS  Recent Labs  01/29/13 1828  HGB 13.3  HCT 38.2*  WBC 15.1*  PLT 242     Recent Labs  01/29/13 1828  CREATININE 0.85     Assessment/Plan: 1 Day Post-Op Procedure(s) (LRB): RIGHT SHOULDER MINI ROTATOR CUFF REPAIR (Right)  D/c home today Pain control F/u in 2 weeks   Michael [name], MPAS, PA-C  01/30/2013, 7:34 AM

## 2013-01-30 NOTE — Discharge Summary (Signed)
Physician Discharge Summary   Patient ID: Michael [name] MRN: [id number] DOB/AGE: 1960/10/17 52 y.o.  Admit date: 01/29/2013 Discharge date: 01/30/2013  Admission Diagnoses:  Principal Problem:   Right rotator cuff tear   Discharge Diagnoses:  Same   Surgeries: Procedure(s): RIGHT SHOULDER MINI ROTATOR CUFF REPAIR on 01/29/2013   Consultants: none  Discharged Condition: Stable  Hospital Course: Michael [name] is an 53 y.o. male who was admitted 01/29/2013 with a chief complaint of No chief complaint on file. , and found to have a diagnosis of Right rotator cuff tear.  They were brought to the operating room on 01/29/2013 and underwent the above named procedures.    The patient had an uncomplicated hospital course and was stable for discharge.  Recent vital signs:  Filed Vitals:   01/30/13 0641  BP: 167/85  Pulse: 74  Temp: 98.2 F (36.8 C)  Resp: 16    Recent laboratory studies:  Results for orders placed during the hospital encounter of 01/29/13  CBC      Result Value Range   WBC 15.1 (*) 4.0 - 10.5 K/uL   RBC 4.23  4.22 - 5.81 MIL/uL   Hemoglobin 13.3  13.0 - 17.0 g/dL   HCT 40.9 (*) 81.1 - 91.4 %   MCV 90.3  78.0 - 100.0 fL   MCH 31.4  26.0 - 34.0 pg   MCHC 34.8  30.0 - 36.0 g/dL   RDW 78.2  95.6 - 21.3 %   Platelets 242  150 - 400 K/uL  CREATININE, SERUM      Result Value Range   Creatinine, Ser 0.85  0.50 - 1.35 mg/dL   GFR calc non Af Amer >90  >90 mL/min   GFR calc Af Amer >90  >90 mL/min    Discharge Medications:     Medication List         amLODipine 5 MG tablet  Commonly known as:  NORVASC  Take 5 mg by mouth daily.     ibuprofen 200 MG tablet  Commonly known as:  ADVIL,MOTRIN  Take 600 mg by mouth every 6 (six) hours as needed for pain.     lisinopril-hydrochlorothiazide 20-25 MG per tablet  Commonly known as:  PRINZIDE,ZESTORETIC  Take 1 tablet by mouth every evening.     methocarbamol 500 MG tablet  Commonly known as:  ROBAXIN    Take 1 tablet (500 mg total) by mouth 3 (three) times daily between meals as needed.     oxyCODONE 5 MG immediate release tablet  Commonly known as:  Oxy IR/ROXICODONE  Take 5 mg by mouth every 6 (six) hours as needed for pain.     oxyCODONE 5 MG immediate release tablet  Commonly known as:  Oxy IR/ROXICODONE  1-2 po q4-6 prn pain     pravastatin 20 MG tablet  Commonly known as:  PRAVACHOL  Take 20 mg by mouth every evening.        Diagnostic Studies: Dg Chest 2 View  01/26/2013   *RADIOLOGY REPORT*  Clinical Data: Hypertension; preoperative evaluation  CHEST - 2 VIEW  Comparison: May 18, 2010  Findings: There is no edema or consolidation.  The heart size and pulmonary vascularity are normal.  No adenopathy.  Slight anterior wedging of a lower thoracic vertebral body is stable.  IMPRESSION: No edema or consolidation.   Original Report Authenticated By: Bretta Bang, M.D.    Disposition: Final discharge disposition not confirmed      Discharge Orders  Future Orders Complete By Expires   Call MD / Call 911  As directed    Comments:     If you experience chest pain or shortness of breath, CALL 911 and be transported to the hospital emergency room.  If you develope a fever above 101 F, pus (white drainage) or increased drainage or redness at the wound, or calf pain, call your surgeon's office.   Constipation Prevention  As directed    Comments:     Drink plenty of fluids.  Prune juice may be helpful.  You may use a stool softener, such as Colace (over the counter) 100 mg twice a day.  Use MiraLax (over the counter) for constipation as needed.   Diet - low sodium heart healthy  As directed    Increase activity slowly as tolerated  As directed       Follow-up Information   Follow up with BEANE,JEFFREY C, MD In 2 weeks.   Specialty:  Orthopedic Surgery   Contact information:   [address] [place] [address] [phone]         Signed: Thea Gist 01/30/2013, 7:36 AM

## 2013-01-30 NOTE — Plan of Care (Signed)
Problem: Discharge Progression Outcomes Goal: Tubes and drains discontinued if indicated Outcome: Completed/Met Date Met:  01/30/13 iv

## 2013-01-30 NOTE — Progress Notes (Signed)
Discharged from floor via w/c, spouse with pt. No changes in assessment. Turhan Chill   

## 2013-01-30 NOTE — Evaluation (Signed)
Occupational Therapy Evaluation Patient Details Name: Michael [name] MRN: [id number] DOB: June 26, 1960 Today's Date: 01/30/2013 Time: 4098-1191 OT Time Calculation (min): 28 min  OT Assessment / Plan / Recommendation History of present illness pt was admitted for mini rotation cuff repair on R side.  subscapularis, supraspinatus, and infraspinatus were involved.  Anchors placed   Clinical Impression   Pt was admitted for the above procedure.  All education was completed.  Pt will call Dr Ermelinda Das office on Monday to see if he wants pendulums initiated at this time.  Educated on these and handout given but did not have pt start them until we can clarify.      OT Assessment  Progress rehab of shoulder as ordered by MD at follow-up appointment    Follow Up Recommendations   (pt will follow up with Dr Shelle Iron)    Barriers to Discharge      Equipment Recommendations  None recommended by OT    Recommendations for Other Services    Frequency       Precautions / Restrictions Precautions Precautions: Shoulder Type of Shoulder Precautions: sling at all times x bathing/dressing/exercise Precaution Booklet Issued: Yes (comment) Restrictions Weight Bearing Restrictions: Yes RUE Weight Bearing: Non weight bearing   Pertinent Vitals/Pain 5/10 beginning.  9/10 end of session.  Pt was premedicated.      ADL  Upper Body Bathing: Moderate assistance Where Assessed - Upper Body Bathing: Unsupported sitting Upper Body Dressing: Moderate assistance Where Assessed - Upper Body Dressing: Unsupported sitting Toilet Transfer: Modified independent Toilet Transfer Method: Sit to stand Toilet Transfer Equipment: Comfort height toilet Transfers/Ambulation Related to ADLs: pt ambulated with iv pole (attached) at mod I/I level ADL Comments: Reviewed shoulder protocol and pt/friend verbalize understanding of all.  See education section of chart.  Friend donned abduction sling with min cues.  She had same  surgery herself several years ago.  Talked to [place], [place].  Exercises were ordered for PT (and OT usually handles these.)  No specifics were given.  He recommended that I show pt pendulums but do not have him do these.  Did this and asked him to call Dr. Ermelinda Das office on Monday to see if Dr. Shelle Iron wants him to initiate these yet.  Educated on elbow to finger ROM.  Pt did not have a button down shirt.  Showed UB dressing using a gown.  Cut a paper shirt open to send him home in.      OT Diagnosis:    OT Problem List:   OT Treatment Interventions:     OT Goals(Current goals can be found in the care plan section)    Visit Information  Last OT Received On: 01/30/13 Assistance Needed: +1 History of Present Illness: pt was admitted for mini rotation cuff repair on R side.  subscapularis, supraspinatus, and infraspinatus were involved.  Anchors placed       Prior Functioning     Home Living Family/patient expects to be discharged to:: Private residence Living Arrangements: Alone Additional Comments: friend will assist over weekend and can have help at night Prior Function Level of Independence: Independent Communication Communication: No difficulties Dominant Hand: Right         Vision/Perception     Cognition  Cognition Arousal/Alertness: Awake/alert Behavior During Therapy: WFL for tasks assessed/performed Overall Cognitive Status: Within Functional Limits for tasks assessed    Extremity/Trunk Assessment Upper Extremity Assessment Upper Extremity Assessment: RUE deficits/detail RUE Deficits / Details: immobilized due to RCR  Mobility Transfers Transfers: Sit to Stand Sit to Stand: 7: Independent     Exercise     Balance     End of Session OT - End of Session Activity Tolerance: Patient limited by pain Patient left: in bed;with family/visitor present;with call bell/phone within reach (EOB)  GO Functional Assessment Tool Used: clinical  observation/judgment Functional Limitation: Self care Self Care Current Status (Z6109): At least 40 percent but less than 60 percent impaired, limited or restricted Self Care Goal Status (U0454): At least 40 percent but less than 60 percent impaired, limited or restricted Self Care Discharge Status [phone]): At least 40 percent but less than 60 percent impaired, limited or restricted   [facility] 01/30/2013, 9:30 AM Marica Otter, OTR/L [phone] 01/30/2013

## 2013-01-30 NOTE — Op Note (Signed)
Michael [name], Michael [name]             ACCOUNT NO.:  [id number]  MEDICAL RECORD NO.:  [id number]  LOCATION:  1612                         FACILITY:  [facility]  PHYSICIAN:  Michael [name], M.D.    DATE OF BIRTH:  Jul 26, 1960  DATE OF PROCEDURE:  01/29/2013 DATE OF DISCHARGE:                              OPERATIVE REPORT   PREOPERATIVE DIAGNOSIS:  Massive tear of the rotator cuff, retracting right.  POSTOP:  Massive tear of the rotator cuff, retracting right.  PROCEDURE PERFORMED:  Mini open rotator cuff repair of massive rotator cuff with repair of the subscap, supraspinatus, and infraspinatus utilizing the SwiveLock suture anchors.  ANESTHESIA:  General.  ASSISTANT:  Michael Poche, PA.  BRIEF HISTORY:  This is a 52 year old who fell on the shoulder, had an injury 3 or 4 months ago, presented with persistent shoulder pain and a massive retracted tear of the rotator cuff back to the glenoid. Initially, I discussed the possibility that may not be repairable; however, he had minimal atrophy and no fatty replacement of the musculature, therefore was not definitively an un-repairable tendon. 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  Risks and benefits were discussed in terms of need for hemiarthroplasty in the future, DVT, PE, anesthetic complications, etc.  TECHNIQUE:  With the patient in supine in beach-chair position, after induction of adequate general anesthesia, 2-g Kefzol, the right shoulder and upper extremity were prepped and draped in usual sterile fashion. Surgical marker was utilized to delineate the acromion, AC joint, and coracoid.  Prior to that, he had a good range of motion.  A 2.5-cm incision over the anterolateral aspect of the acromion was  made through the skin only.  Subcutaneous tissue was dissected.  Electrocautery was utilized to achieve the hemostasis.  The raphe between the anterolateral heads was identified, divided after infiltration with Marcaine. Subperiosteally, elevated the anterolateral and anteromedial aspect of the acromion, exposing the acromion, preserving the deltoid attachment. A 3-mm Kerrison was utilized to perform an acromioplasty, a small spur on the anterolateral aspect of the acromion.  Fully exposed humeral head was noted.  Copiously irrigated the wound.  We then had spent considerable time, mobilizing the tendons, first the supraspinatus.  The biceps tendon had been torn, so the bicipital groove.  There was tearing of the anterior to lateral aspect of the subscap with a full-thickness retracted tear of the supraspinatus which was retracted medially and anteriorly.  There was a portion of the infraspinatus that was retracted in backwards, meticulously mobilized both the articular and bursal surfaces of the infraspinatus, supraspinatus, and a portion of the subscap to the level of the glenoid.  Though, I felt it was clear that the most of the central and medial portion of the supraspinatus was not and would not advance to the greater tuberosity.  Therefore, I felt in a stepwise fashion repairing the portion of the subscap and the anterior half of the supraspinatus would at least give  him coverage anteriorly with the infraspinatus posteriorly.  Then, fashioned the trough and the greater tuberosity of the lateral articular surface, and after mobilization, placed a SwiveLock utilizing the awl and __________ with a Scorpion through the supraspinatus and advancing it laterally and posteriorly.  With the arm in a neutral position and slightly abducted, we then passed the fiber tape through this leaflet, crossed it, and brought it over the greater tuberosity and secured it to a SwiveLock after utilizing an  awl, inserting the SwiveLock and the appropriate tension and securing this.  This did advance that posteriorly and laterally to significant portion of the anterior aspect of the humeral head and partially of the central portion.  I then used a rescue stitch and threaded it through the torn portion of the subscap to the lateral aspect of the greater tuberosity and tied it with a good surgical knot that provided full coverage __________ biceps was absent.  After meticulous mobilization of the infraspinatus in a similar fashion, we placed a SwiveLock after an awl into the cancellous bed, threading it posteriorly through the infraspinatus, passing 2 fiber tapes, crossing them, and then bringing them anteriorly and laterally and securing over the greater tuberosity with another PushLock after utilizing an awl. Insertion of the leaflets into that PushLock after the appropriate tensioning.  With both of these, there then was a coverage of the posterior portion up to the center portion.  There, however, was a central portion of the rotator cuff complex that was not repairable or covered.  I could not advance then the remaining anterior and posterior portions to each other to a side-to-side stitch because of tension.  I felt this area was too great an area to be amenable __________ to the glenohumeral joint.  I felt we had maximized the available coverage and repair with what was available to [place], copiously irrigated that wound then.  Inspection again revealed a good coverage.  We had excised the bursa.  We repaired the raphe with 1 Vicryl interrupted figure-of-eight sutures over on top through the acromion, subcu with 2-0, skin with subcuticular Prolene.  Sterile dressing applied, placed on abduction pillow, extubated without difficulty, and transported to the recovery room in satisfactory condition.  The patient tolerated the procedure well.  There were no complications, no blood  loss.     Michael [name], M.D.     Michael [name]  D:  01/29/2013  T:  01/29/2013  Job:  [id number]

## 2013-02-01 ENCOUNTER — Encounter (HOSPITAL_COMMUNITY): Payer: Self-pay | Admitting: Specialist

## 2015-06-03 ENCOUNTER — Emergency Department (HOSPITAL_COMMUNITY): Payer: Self-pay

## 2015-06-03 ENCOUNTER — Emergency Department (HOSPITAL_COMMUNITY): Payer: Worker's Compensation

## 2015-06-03 ENCOUNTER — Emergency Department (HOSPITAL_COMMUNITY)
Admission: EM | Admit: 2015-06-03 | Discharge: 2015-06-03 | Disposition: A | Discharge status: Home / Self Care | Payer: Self-pay | Attending: Emergency Medicine | Admitting: Emergency Medicine

## 2015-06-03 ENCOUNTER — Encounter (HOSPITAL_COMMUNITY): Payer: Self-pay | Admitting: Emergency Medicine

## 2015-06-03 DIAGNOSIS — Z8619 Personal history of other infectious and parasitic diseases: Secondary | ICD-10-CM | POA: Insufficient documentation

## 2015-06-03 DIAGNOSIS — S161XXA Strain of muscle, fascia and tendon at neck level, initial encounter: Secondary | ICD-10-CM | POA: Insufficient documentation

## 2015-06-03 DIAGNOSIS — Z79899 Other long term (current) drug therapy: Secondary | ICD-10-CM | POA: Insufficient documentation

## 2015-06-03 DIAGNOSIS — Y9389 Activity, other specified: Secondary | ICD-10-CM | POA: Insufficient documentation

## 2015-06-03 DIAGNOSIS — S3991XA Unspecified injury of abdomen, initial encounter: Secondary | ICD-10-CM | POA: Insufficient documentation

## 2015-06-03 DIAGNOSIS — S40211A Abrasion of right shoulder, initial encounter: Secondary | ICD-10-CM | POA: Insufficient documentation

## 2015-06-03 DIAGNOSIS — Y998 Other external cause status: Secondary | ICD-10-CM | POA: Insufficient documentation

## 2015-06-03 DIAGNOSIS — I10 Essential (primary) hypertension: Secondary | ICD-10-CM | POA: Insufficient documentation

## 2015-06-03 DIAGNOSIS — S299XXA Unspecified injury of thorax, initial encounter: Secondary | ICD-10-CM | POA: Insufficient documentation

## 2015-06-03 DIAGNOSIS — Y92481 Parking lot as the place of occurrence of the external cause: Secondary | ICD-10-CM | POA: Insufficient documentation

## 2015-06-03 DIAGNOSIS — F17210 Nicotine dependence, cigarettes, uncomplicated: Secondary | ICD-10-CM | POA: Insufficient documentation

## 2015-06-03 DIAGNOSIS — S46001A Unspecified injury of muscle(s) and tendon(s) of the rotator cuff of right shoulder, initial encounter: Secondary | ICD-10-CM | POA: Insufficient documentation

## 2015-06-03 LAB — RAPID URINE DRUG SCREEN, HOSP PERFORMED
Amphetamines: NOT DETECTED
BARBITURATES: NOT DETECTED
Benzodiazepines: NOT DETECTED
COCAINE: NOT DETECTED
OPIATES: NOT DETECTED
Tetrahydrocannabinol: NOT DETECTED

## 2015-06-03 LAB — CBC WITH DIFFERENTIAL/PLATELET
BASOS ABS: 0.1 10*3/uL (ref 0.0–0.1)
Basophils Relative: 0 %
Eosinophils Absolute: 0.1 10*3/uL (ref 0.0–0.7)
Eosinophils Relative: 1 %
HEMATOCRIT: 42.7 % (ref 39.0–52.0)
Hemoglobin: 15 g/dL (ref 13.0–17.0)
LYMPHS ABS: 2.1 10*3/uL (ref 0.7–4.0)
LYMPHS PCT: 14 %
MCH: 31.5 pg (ref 26.0–34.0)
MCHC: 35.1 g/dL (ref 30.0–36.0)
MCV: 89.7 fL (ref 78.0–100.0)
Monocytes Absolute: 0.9 10*3/uL (ref 0.1–1.0)
Monocytes Relative: 6 %
NEUTROS ABS: 11.9 10*3/uL — AB (ref 1.7–7.7)
Neutrophils Relative %: 79 %
Platelets: 279 10*3/uL (ref 150–400)
RBC: 4.76 MIL/uL (ref 4.22–5.81)
RDW: 12.9 % (ref 11.5–15.5)
WBC: 15 10*3/uL — AB (ref 4.0–10.5)

## 2015-06-03 LAB — COMPREHENSIVE METABOLIC PANEL
ALBUMIN: 4.5 g/dL (ref 3.5–5.0)
ALT: 35 U/L (ref 17–63)
ANION GAP: 11 (ref 5–15)
AST: 27 U/L (ref 15–41)
Alkaline Phosphatase: 76 U/L (ref 38–126)
BUN: 18 mg/dL (ref 6–20)
CHLORIDE: 102 mmol/L (ref 101–111)
CO2: 22 mmol/L (ref 22–32)
CREATININE: 0.95 mg/dL (ref 0.61–1.24)
Calcium: 9.3 mg/dL (ref 8.9–10.3)
GFR calc non Af Amer: >60 mL/min (ref >60)
GLUCOSE: 93 mg/dL (ref 65–99)
Potassium: 4 mmol/L (ref 3.5–5.1)
SODIUM: 135 mmol/L (ref 135–145)
Total Bilirubin: 1.3 mg/dL — ABNORMAL HIGH (ref 0.3–1.2)
Total Protein: 7.7 g/dL (ref 6.5–8.1)

## 2015-06-03 LAB — URINALYSIS, ROUTINE W REFLEX MICROSCOPIC
BILIRUBIN URINE: NEGATIVE
Glucose, UA: NEGATIVE mg/dL
Hgb urine dipstick: NEGATIVE
KETONES UR: NEGATIVE mg/dL
Leukocytes, UA: NEGATIVE
NITRITE: NEGATIVE
PH: 5.5 (ref 5.0–8.0)
PROTEIN: NEGATIVE mg/dL
Specific Gravity, Urine: 1.018 (ref 1.005–1.030)

## 2015-06-03 LAB — ABO/RH: ABO/RH(D): B POS

## 2015-06-03 LAB — PROTIME-INR
INR: 1.02 (ref 0.00–1.49)
Prothrombin Time: 13.6 seconds (ref 11.6–15.2)

## 2015-06-03 LAB — ETHANOL: Alcohol, Ethyl (B): <5 mg/dL (ref <5)

## 2015-06-03 LAB — TROPONIN I

## 2015-06-03 LAB — TYPE AND SCREEN
ABO/RH(D): B POS
ANTIBODY SCREEN: NEGATIVE

## 2015-06-03 MED ORDER — IBUPROFEN 800 MG PO TABS: 800.0000 mg | ORAL_TABLET | Freq: Three times a day (TID) | ORAL | Status: DC

## 2015-06-03 MED ORDER — KETOROLAC TROMETHAMINE 30 MG/ML IJ SOLN
30.0000 mg | Freq: Once | INTRAMUSCULAR | Status: AC
  Administered 2015-06-03: 30 mg via INTRAVENOUS
  Filled 2015-06-03: qty 1

## 2015-06-03 MED ORDER — IOHEXOL 300 MG/ML  SOLN
100.0000 mL | Freq: Once | INTRAMUSCULAR | Status: AC | PRN
  Administered 2015-06-03: 100 mL via INTRAVENOUS

## 2015-06-03 MED ORDER — CYCLOBENZAPRINE HCL 10 MG PO TABS: 10.0000 mg | ORAL_TABLET | Freq: Two times a day (BID) | ORAL | Status: DC | PRN

## 2015-06-03 MED ORDER — CYCLOBENZAPRINE HCL 10 MG PO TABS
10.0000 mg | ORAL_TABLET | Freq: Once | ORAL | Status: AC
  Administered 2015-06-03: 10 mg via ORAL
  Filled 2015-06-03: qty 1

## 2015-06-03 MED ORDER — MORPHINE SULFATE (PF) 4 MG/ML IV SOLN
4.0000 mg | Freq: Once | INTRAVENOUS | Status: AC
  Administered 2015-06-03: 4 mg via INTRAVENOUS
  Filled 2015-06-03: qty 1

## 2015-06-03 NOTE — ED Notes (Signed)
Patient transported to CT 
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
## 2015-06-03 NOTE — ED Notes (Signed)
MD at bedside. 

## 2015-06-03 NOTE — Discharge Instructions (Signed)
Rotator Cuff Injury Rotator cuff injury is any type of injury to the set of muscles and tendons that make up the stabilizing unit of your shoulder. This unit holds the ball of your upper arm bone (humerus) in the socket of your shoulder blade (scapula).  CAUSES Injuries to your rotator cuff most commonly come from sports or activities that cause your arm to be moved repeatedly over your head. Examples of this include throwing, weight lifting, swimming, or racquet sports. Long lasting (chronic) irritation of your rotator cuff can cause soreness and swelling (inflammation), bursitis, and eventual damage to your tendons, such as a tear (rupture). SIGNS AND SYMPTOMS Acute rotator cuff tear:  Sudden tearing sensation followed by severe pain shooting from your upper shoulder down your arm toward your elbow.  Decreased range of motion of your shoulder because of pain and muscle spasm.  Severe pain.  Inability to raise your arm out to the side because of pain and loss of muscle power (large tears). Chronic rotator cuff tear:  Pain that usually is worse at night and may interfere with sleep.  Gradual weakness and decreased shoulder motion as the pain worsens.  Decreased range of motion. Rotator cuff tendinitis:  Deep ache in your shoulder and the outside upper arm over your shoulder.  Pain that comes on gradually and becomes worse when lifting your arm to the side or turning it inward. DIAGNOSIS Rotator cuff injury is diagnosed through a medical history, physical exam, and imaging exam. The medical history helps determine the type of rotator cuff injury. Your health care provider will look at your injured shoulder, feel the injured area, and ask you to move your shoulder in different positions. X-ray exams typically are done to rule out other causes of shoulder pain, such as fractures. MRI is the exam of choice for the most severe shoulder injuries because the images show muscles and tendons.    TREATMENT  Chronic tear:  Medicine for pain, such as acetaminophen or ibuprofen.  Physical therapy and range-of-motion exercises may be helpful in maintaining shoulder function and strength.  Steroid injections into your shoulder joint.  Surgical repair of the rotator cuff if the injury does not heal with noninvasive treatment. Acute tear:  Anti-inflammatory medicines such as ibuprofen and naproxen to help reduce pain and swelling.  A sling to help support your arm and rest your rotator cuff muscles. Long-term use of a sling is not advised. It may cause significant stiffening of the shoulder joint.  Surgery may be considered within a few weeks, especially in younger, active people, to return the shoulder to full function.  Indications for surgical treatment include the following:  Age younger than 60 years.  Rotator cuff tears that are complete.  Physical therapy, rest, and anti-inflammatory medicines have been used for 6-8 weeks, with no improvement.  Employment or sporting activity that requires constant shoulder use. Tendinitis:  Anti-inflammatory medicines such as ibuprofen and naproxen to help reduce pain and swelling.  A sling to help support your arm and rest your rotator cuff muscles. Long-term use of a sling is not advised. It may cause significant stiffening of the shoulder joint.  Severe tendinitis may require:  Steroid injections into your shoulder joint.  Physical therapy.  Surgery. HOME CARE INSTRUCTIONS   Apply ice to your injury:  Put ice in a plastic bag.  Place a towel between your skin and the bag.  Leave the ice on for 20 minutes, 2-3 times a day.  If you  have a shoulder immobilizer (sling and straps), wear it until told otherwise by your health care provider.  You may want to sleep on several pillows or in a recliner at night to lessen swelling and pain.  Only take over-the-counter or prescription medicines for pain, discomfort, or fever as  directed by your health care provider.  Do simple hand squeezing exercises with a soft rubber ball to decrease hand swelling. SEEK MEDICAL CARE IF:   Your shoulder pain increases, or new pain or numbness develops in your arm, hand, or fingers.  Your hand or fingers are colder than your other hand. SEEK IMMEDIATE MEDICAL CARE IF:   Your arm, hand, or fingers are numb or tingling.  Your arm, hand, or fingers are increasingly swollen and painful, or they turn white or blue. MAKE SURE YOU:  Understand these instructions.  Will watch your condition.  Will get help right away if you are not doing well or get worse.   This information is not intended to replace advice given to you by your health care provider. Make sure you discuss any questions you have with your health care provider.   Document Released: 05/10/2000 Document Revised: 05/18/2013 Document Reviewed: 12/23/2012 Elsevier Interactive Patient Education 2016 [employer] Chest Wall Pain Chest wall pain is pain in or around the bones and muscles of your chest. Sometimes, an injury causes this pain. Sometimes, the cause may not be known. This pain may take several weeks or longer to get better. HOME CARE INSTRUCTIONS  Pay attention to any changes in your symptoms. Take these actions to help with your pain:   Rest as told by your health care provider.   Avoid activities that cause pain. These include any activities that use your chest muscles or your abdominal and side muscles to lift heavy items.   If directed, apply ice to the painful area:  Put ice in a plastic bag.  Place a towel between your skin and the bag.  Leave the ice on for 20 minutes, 2-3 times per day.  Take over-the-counter and prescription medicines only as told by your health care provider.  Do not use tobacco products, including cigarettes, chewing tobacco, and e-cigarettes. If you need help quitting, ask your health care provider.  Keep all  follow-up visits as told by your health care provider. This is important. SEEK MEDICAL CARE IF:  You have a fever.  Your chest pain becomes worse.  You have new symptoms. SEEK IMMEDIATE MEDICAL CARE IF:  You have nausea or vomiting.  You feel sweaty or light-headed.  You have a cough with phlegm (sputum) or you cough up blood.  You develop shortness of breath.   This information is not intended to replace advice given to you by your health care provider. Make sure you discuss any questions you have with your health care provider.   Document Released: 05/13/2005 Document Revised: 02/01/2015 Document Reviewed: 08/08/2014 Elsevier Interactive Patient Education 2016 [employer] Blunt Abdominal Trauma Blunt abdominal trauma is a type of injury that involves damage to the abdominal wall or to abdominal organs, such as the liver or spleen. The damage can involve bruising, tearing, or a rupture. This type of injury does not involve a puncture of the skin. Blunt abdominal trauma can range from mild to severe. In some cases it can lead to a severe abdominal inflammation (peritonitis), severe bleeding, and a dangerous drop in blood pressure. CAUSES This injury is caused by a hard, direct hit to the abdomen.  It can happen after:  A motor vehicle accident.  Being kicked or punched in the abdomen.  Falling from a significant height. RISK FACTORS This injury is more likely to happen in people who:  Play contact sports.  Work in a job in which falls or injuries are more likely, such as in Holiday representative. SYMPTOMS The main symptom of this condition is pain in the abdomen. Other symptoms depend on the type and location of the injury. They can include:  Abdominal pain that spreads to the the back or shoulder.  Bruising.  Swelling.  Pain when pressing on the abdomen.  Blood in the urine.  Weakness.  Confusion.  Loss of consciousness.  Pale, dusky, cool, or sweaty  skin.  Vomiting blood.  Bloody stool or bleeding from the rectum.  Trouble breathing. Symptoms of this injury can develop suddenly or slowly.  DIAGNOSIS This injury is diagnosed based on your symptoms and a physical exam. You may also have tests, including:  Blood tests.  Urine tests.  Imaging tests, such as:  A CT scan and ultrasound of your abdomen.  X-rays of your chest and abdomen.  A test in which a tube is used to flush your abdomen with fluid and check for blood (diagnostic peritoneal lavage). TREATMENT Treatment for this injury depends on its type and severity. Treatment options include:  Observation. If the injury is mild, this may be the only treatment needed.  Support of your blood pressure and breathing.  Getting blood, fluids, or medicine through an IV tube.  Antibiotic medicine.  Insertion of tubes into the stomach or bladder.  A blood transfusion.  A procedure to stop bleeding. This involves putting a long, thin tube (catheter) into one of your blood vessels (angiographic embolization).  Surgery to open up your abdomen and control bleeding or repair damage (laparotomy). This may be done if tests suggest that you have peritonitis or bleeding that cannot be controlled with angiographic embolization. HOME CARE INSTRUCTIONS  Take medicines only as directed by your health care provider.  If you were prescribed an antibiotic medicine, finish all of it even if you start to feel better.  Follow your health care provider's instructions about diet and activity restrictions.  Keep all follow-up visits as directed by your health care provider. This is important. SEEK MEDICAL CARE IF:  You continue to have abdominal pain.  Your symptoms return.  You develop new symptoms.  You have blood in your urine or your bowel movements. SEEK IMMEDIATE MEDICAL CARE IF:  You vomit blood.  You have heavy bleeding from your rectum.  You have very bad abdominal  pain.  You have trouble breathing.  You have chest pain.  You have a fever.  You have dizziness.  You pass out.   This information is not intended to replace advice given to you by your health care provider. Make sure you discuss any questions you have with your health care provider.   Document Released: 06/20/2004 Document Revised: 09/27/2014 Document Reviewed: 05/04/2014 Else[employer] Interactive Patient Education [employer]

## 2015-06-03 NOTE — ED Notes (Signed)
Per pt, states he was spreading salt at work and a truck hit him, slamming him to there ground-having right shoulder pain-and right side pain

## 2015-06-03 NOTE — ED Notes (Signed)
Patient transported to X-ray 

## 2015-06-03 NOTE — ED Notes (Signed)
Family at bedside. 

## 2016-10-18 ENCOUNTER — Ambulatory Visit: Payer: Self-pay | Attending: Internal Medicine | Admitting: Internal Medicine

## 2016-10-18 ENCOUNTER — Encounter: Payer: Self-pay | Admitting: Internal Medicine

## 2016-10-18 VITALS — BP 181/97 | HR 97 | Temp 97.6°F | Resp 20 | Ht 71.0 in | Wt 236.8 lb

## 2016-10-18 DIAGNOSIS — G8929 Other chronic pain: Secondary | ICD-10-CM | POA: Insufficient documentation

## 2016-10-18 DIAGNOSIS — F17210 Nicotine dependence, cigarettes, uncomplicated: Secondary | ICD-10-CM | POA: Insufficient documentation

## 2016-10-18 DIAGNOSIS — F17200 Nicotine dependence, unspecified, uncomplicated: Secondary | ICD-10-CM | POA: Insufficient documentation

## 2016-10-18 DIAGNOSIS — F1911 Other psychoactive substance abuse, in remission: Secondary | ICD-10-CM | POA: Insufficient documentation

## 2016-10-18 DIAGNOSIS — M25562 Pain in left knee: Secondary | ICD-10-CM | POA: Insufficient documentation

## 2016-10-18 DIAGNOSIS — I10 Essential (primary) hypertension: Secondary | ICD-10-CM | POA: Insufficient documentation

## 2016-10-18 DIAGNOSIS — R600 Localized edema: Secondary | ICD-10-CM

## 2016-10-18 DIAGNOSIS — Z23 Encounter for immunization: Secondary | ICD-10-CM

## 2016-10-18 DIAGNOSIS — Z8619 Personal history of other infectious and parasitic diseases: Secondary | ICD-10-CM | POA: Insufficient documentation

## 2016-10-18 MED ORDER — NICOTINE 21 MG/24HR TD PT24: 21.0000 mg | MEDICATED_PATCH | Freq: Every day | TRANSDERMAL | 0 refills | Status: DC

## 2016-10-18 MED ORDER — AMLODIPINE BESYLATE 5 MG PO TABS: 5.0000 mg | ORAL_TABLET | Freq: Every day | ORAL | 99 refills | Status: DC

## 2016-10-18 MED ORDER — NICOTINE 7 MG/24HR TD PT24: 7.0000 mg | MEDICATED_PATCH | Freq: Every day | TRANSDERMAL | 0 refills | Status: DC

## 2016-10-18 MED ORDER — NICOTINE 14 MG/24HR TD PT24: 14.0000 mg | MEDICATED_PATCH | Freq: Every day | TRANSDERMAL | 0 refills | Status: DC

## 2016-10-18 MED ORDER — DICLOFENAC SODIUM 1 % TD GEL: 2.0000 g | Freq: Four times a day (QID) | TRANSDERMAL | 2 refills | Status: DC

## 2016-10-18 MED ORDER — LISINOPRIL-HYDROCHLOROTHIAZIDE 20-25 MG PO TABS: 1.0000 | ORAL_TABLET | Freq: Every evening | ORAL | 99 refills | Status: DC

## 2016-10-18 MED FILL — VOLTAREN 1% GEL: 1 | 12 days supply | Qty: 100 | Fill #0

## 2016-10-18 MED FILL — ?AMLODIPINE BESYLATE 5 MG T: 5 | 30 days supply | Qty: 30 | Fill #0

## 2016-10-18 MED FILL — LISINOPRIL-HCTZ 20-25 MG TA: 20-25 | 30 days supply | Qty: 30 | Fill #0

## 2016-10-18 NOTE — Patient Instructions (Addendum)
Have patient sign a release for [place] to get his records from [facility]. Give appointment with Ms. Leavy Cella.     Use the nicotine patches starting with the 21 mg patch as discussed today. Use the diclofenac gel on your left knee for the pain. Try to limit salt in the foods as discussed today.   DASH Eating Plan DASH stands for "Dietary Approaches to Stop Hypertension." The DASH eating plan is a healthy eating plan that has been shown to reduce high blood pressure (hypertension). It may also reduce your risk for type 2 diabetes, heart disease, and stroke. The DASH eating plan may also help with weight loss. What are tips for following this plan? General guidelines   Avoid eating more than 2,300 mg (milligrams) of salt (sodium) a day. If you have hypertension, you may need to reduce your sodium intake to 1,500 mg a day.  Limit alcohol intake to no more than 1 drink a day for nonpregnant women and 2 drinks a day for men. One drink equals 12 oz of beer, 5 oz of wine, or 1 oz of hard liquor.  Work with your health care provider to maintain a healthy body weight or to lose weight. Ask what an ideal weight is for you.  Get at least 30 minutes of exercise that causes your heart to beat faster (aerobic exercise) most days of the week. Activities may include walking, swimming, or biking.  Work with your health care provider or diet and nutrition specialist (dietitian) to adjust your eating plan to your individual calorie needs. Reading food labels   Check food labels for the amount of sodium per serving. Choose foods with less than 5 percent of the Daily Value of sodium. Generally, foods with less than 300 mg of sodium per serving fit into this eating plan.  To find whole grains, look for the word "whole" as the first word in the ingredient list. Shopping   Buy products labeled as "low-sodium" or "no salt added."  Buy fresh foods. Avoid canned foods and premade or frozen  meals. Cooking   Avoid adding salt when cooking. Use salt-free seasonings or herbs instead of table salt or sea salt. Check with your health care provider or pharmacist before using salt substitutes.  Do not fry foods. Cook foods using healthy methods such as baking, boiling, grilling, and broiling instead.  Cook with heart-healthy oils, such as olive, canola, soybean, or sunflower oil. Meal planning    Eat a balanced diet that includes:  5 or more servings of fruits and vegetables each day. At each meal, try to fill half of your plate with fruits and vegetables.  Up to 6-8 servings of whole grains each day.  Less than 6 oz of lean meat, poultry, or fish each day. A 3-oz serving of meat is about the same size as a deck of cards. One egg equals 1 oz.  2 servings of low-fat dairy each day.  A serving of nuts, seeds, or beans 5 times each week.  Heart-healthy fats. Healthy fats called Omega-3 fatty acids are found in foods such as flaxseeds and coldwater fish, like sardines, salmon, and mackerel.  Limit how much you eat of the following:  Canned or prepackaged foods.  Food that is high in trans fat, such as fried foods.  Food that is high in saturated fat, such as fatty meat.  Sweets, desserts, sugary drinks, and other foods with added sugar.  Full-fat dairy products.  Do not  salt foods before eating.  Try to eat at least 2 vegetarian meals each week.  Eat more home-cooked food and less restaurant, buffet, and fast food.  When eating at a restaurant, ask that your food be prepared with less salt or no salt, if possible. What foods are recommended? The items listed may not be a complete list. Talk with your dietitian about what dietary choices are best for you. Grains  Whole-grain or whole-wheat bread. Whole-grain or whole-wheat pasta. Brown rice. Orpah Cobb. Bulgur. Whole-grain and low-sodium cereals. Pita bread. Low-fat, low-sodium crackers. Whole-wheat flour  tortillas. Vegetables  Fresh or frozen vegetables (raw, steamed, roasted, or grilled). Low-sodium or reduced-sodium tomato and vegetable juice. Low-sodium or reduced-sodium tomato sauce and tomato paste. Low-sodium or reduced-sodium canned vegetables. Fruits  All fresh, dried, or frozen fruit. Canned fruit in natural juice (without added sugar). Meat and other protein foods  Skinless chicken or [place]. Ground chicken or [place]. Pork with fat trimmed off. Fish and seafood. Egg whites. Dried beans, peas, or lentils. Unsalted nuts, nut butters, and seeds. Unsalted canned beans. Lean cuts of beef with fat trimmed off. Low-sodium, lean deli meat. Dairy  Low-fat (1%) or fat-free (skim) milk. Fat-free, low-fat, or reduced-fat cheeses. Nonfat, low-sodium ricotta or cottage cheese. Low-fat or nonfat yogurt. Low-fat, low-sodium cheese. Fats and oils  Soft margarine without trans fats. Vegetable oil. Low-fat, reduced-fat, or light mayonnaise and salad dressings (reduced-sodium). Canola, safflower, olive, soybean, and sunflower oils. Avocado. Seasoning and other foods  Herbs. Spices. Seasoning mixes without salt. Unsalted popcorn and pretzels. Fat-free sweets. What foods are not recommended? The items listed may not be a complete list. Talk with your dietitian about what dietary choices are best for you. Grains  Baked goods made with fat, such as croissants, muffins, or some breads. Dry pasta or rice meal packs. Vegetables  Creamed or fried vegetables. Vegetables in a cheese sauce. Regular canned vegetables (not low-sodium or reduced-sodium). Regular canned tomato sauce and paste (not low-sodium or reduced-sodium). Regular tomato and vegetable juice (not low-sodium or reduced-sodium). Rosita Fire. Olives. Fruits  Canned fruit in a light or heavy syrup. Fried fruit. Fruit in cream or butter sauce. Meat and other protein foods  Fatty cuts of meat. Ribs. Fried meat. Tomasa Blase. Sausage. Bologna and other processed  lunch meats. Salami. Fatback. Hotdogs. Bratwurst. Salted nuts and seeds. Canned beans with added salt. Canned or smoked fish. Whole eggs or egg yolks. Chicken or [place] with skin. Dairy  Whole or 2% milk, cream, and half-and-half. Whole or full-fat cream cheese. Whole-fat or sweetened yogurt. Full-fat cheese. Nondairy creamers. Whipped toppings. Processed cheese and cheese spreads. Fats and oils  Butter. Stick margarine. Lard. Shortening. Ghee. Bacon fat. Tropical oils, such as coconut, palm kernel, or palm oil. Seasoning and other foods  Salted popcorn and pretzels. Onion salt, garlic salt, seasoned salt, table salt, and sea salt. Worcestershire sauce. Tartar sauce. Barbecue sauce. Teriyaki sauce. Soy sauce, including reduced-sodium. Steak sauce. Canned and packaged gravies. Fish sauce. Oyster sauce. Cocktail sauce. Horseradish that you find on the shelf. Ketchup. Mustard. Meat flavorings and tenderizers. Bouillon cubes. Hot sauce and Tabasco sauce. Premade or packaged marinades. Premade or packaged taco seasonings. Relishes. Regular salad dressings. Where to find more information:  National Heart, Lung, and Blood Institute: PopSteam.is  American Heart Association: www.heart.org Summary  The DASH eating plan is a healthy eating plan that has been shown to reduce high blood pressure (hypertension). It may also reduce your risk for type 2 diabetes, heart disease, and  stroke.  With the DASH eating plan, you should limit salt (sodium) intake to 2,300 mg a day. If you have hypertension, you may need to reduce your sodium intake to 1,500 mg a day.  When on the DASH eating plan, aim to eat more fresh fruits and vegetables, whole grains, lean proteins, low-fat dairy, and heart-healthy fats.  Work with your health care provider or diet and nutrition specialist (dietitian) to adjust your eating plan to your individual calorie needs. This information is not intended to replace advice given to you by  your health care provider. Make sure you discuss any questions you have with your health care provider. Document Released: 05/02/2011 Document Revised: 05/06/2016 Document Reviewed: 05/06/2016 Elsevier Interactive Patient Education  2017 [employer]  Tdap Vaccine (Tetanus, Diphtheria and Pertussis): What You Need to Know  1. Why get vaccinated? Tetanus, diphtheria and pertussis are very serious diseases. Tdap vaccine can protect us from these diseases. And, Tdap vaccine given to pregnant women can protect newborn babies against pertussis. TETANUS (Lockjaw) is rare in the [place] today. It causes painful muscle tightening and stiffness, usually all over the body.  It can lead to tightening of muscles in the head and neck so you can't open your mouth, swallow, or sometimes even breathe. Tetanus kills about 1 out of 10 people who are infected even after receiving the best medical care. DIPHTHERIA is also rare in the [place] today. It can cause a thick coating to form in the back of the throat.  It can lead to breathing problems, heart failure, paralysis, and death. PERTUSSIS (Whooping Cough) causes severe coughing spells, which can cause difficulty breathing, vomiting and disturbed sleep.  It can also lead to weight loss, incontinence, and rib fractures. Up to 2 in 100 adolescents and 5 in 100 adults with pertussis are hospitalized or have complications, which could include pneumonia or death. These diseases are caused by bacteria. Diphtheria and pertussis are spread from person to person through secretions from coughing or sneezing. Tetanus enters the body through cuts, scratches, or wounds. Before vaccines, as many as 200,000 cases of diphtheria, 200,000 cases of pertussis, and hundreds of cases of tetanus, were reported in the [place] each year. Since vaccination began, reports of cases for tetanus and diphtheria have dropped by about 99% and for pertussis by about 80%. 2.  Tdap vaccine Tdap vaccine can protect adolescents and adults from tetanus, diphtheria, and pertussis. One dose of Tdap is routinely given at age 55 or 4512. People who did not get Tdap at that age should get it as soon as possible. Tdap is especially important for healthcare professionals and anyone having close contact with a baby younger than 12 months. Pregnant women should get a dose of Tdap during every pregnancy, to protect the newborn from pertussis. Infants are most at risk for severe, life-threatening complications from pertussis. Another vaccine, called Td, protects against tetanus and diphtheria, but not pertussis. A Td booster should be given every 10 years. Tdap may be given as one of these boosters if you have never gotten Tdap before. Tdap may also be given after a severe cut or burn to prevent tetanus infection. Your doctor or the person giving you the vaccine can give you more information. Tdap may safely be given at the same time as other vaccines. 3. Some people should not get this vaccine  A person who has ever had a life-threatening allergic reaction after a previous dose of any diphtheria, tetanus or  pertussis containing vaccine, OR has a severe allergy to any part of this vaccine, should not get Tdap vaccine. Tell the person giving the vaccine about any severe allergies.  Anyone who had coma or long repeated seizures within 7 days after a childhood dose of DTP or DTaP, or a previous dose of Tdap, should not get Tdap, unless a cause other than the vaccine was found. They can still get Td.  Talk to your doctor if you:  have seizures or another nervous system problem,  had severe pain or swelling after any vaccine containing diphtheria, tetanus or pertussis,  ever had a condition called Guillain-Barr Syndrome (GBS),  aren't feeling well on the day the shot is scheduled. 4. Risks With any medicine, including vaccines, there is a chance of side effects. These are usually mild  and go away on their own. Serious reactions are also possible but are rare. Most people who get Tdap vaccine do not have any problems with it. Mild problems following Tdap:  (Did not interfere with activities)  Pain where the shot was given (about 3 in 4 adolescents or 2 in 3 adults)  Redness or swelling where the shot was given (about 1 person in 5)  Mild fever of at least 100.61F (up to about 1 in 25 adolescents or 1 in 100 adults)  Headache (about 3 or 4 people in 10)  Tiredness (about 1 person in 3 or 4)  Nausea, vomiting, diarrhea, stomach ache (up to 1 in 4 adolescents or 1 in 10 adults)  Chills, sore joints (about 1 person in 10)  Body aches (about 1 person in 3 or 4)  Rash, swollen glands (uncommon) Moderate problems following Tdap:  (Interfered with activities, but did not require medical attention)  Pain where the shot was given (up to 1 in 5 or 6)  Redness or swelling where the shot was given (up to about 1 in 16 adolescents or 1 in 12 adults)  Fever over 102F (about 1 in 100 adolescents or 1 in 250 adults)  Headache (about 1 in 7 adolescents or 1 in 10 adults)  Nausea, vomiting, diarrhea, stomach ache (up to 1 or 3 people in 100)  Swelling of the entire arm where the shot was given (up to about 1 in 500). Severe problems following Tdap:  (Unable to perform usual activities; required medical attention)  Swelling, severe pain, bleeding and redness in the arm where the shot was given (rare). Problems that could happen after any vaccine:   People sometimes faint after a medical procedure, including vaccination. Sitting or lying down for about 15 minutes can help prevent fainting, and injuries caused by a fall. Tell your doctor if you feel dizzy, or have vision changes or ringing in the ears.  Some people get severe pain in the shoulder and have difficulty moving the arm where a shot was given. This happens very rarely.  Any medication can cause a severe allergic  reaction. Such reactions from a vaccine are very rare, estimated at fewer than 1 in a million doses, and would happen within a few minutes to a few hours after the vaccination. As with any medicine, there is a very remote chance of a vaccine causing a serious injury or death. The safety of vaccines is always being monitored. For more information, visit: http://floyd.org/ 5. What if there is a serious problem? What should I look for?  Look for anything that concerns you, such as signs of a severe allergic reaction, very  high fever, or unusual behavior. Signs of a severe allergic reaction can include hives, swelling of the face and throat, difficulty breathing, a fast heartbeat, dizziness, and weakness. These would usually start a few minutes to a few hours after the vaccination. What should I do?   If you think it is a severe allergic reaction or other emergency that can't wait, call 9-1-1 or get the person to the nearest hospital. Otherwise, call your doctor.  Afterward, the reaction should be reported to the Vaccine Adverse Event Reporting System (VAERS). Your doctor might file this report, or you can do it yourself through the VAERS web site at www.vaers.LAgents.no, or by calling 1-4707882918.  VAERS does not give medical advice. 6. The National Vaccine Injury Compensation Program The [employer] Vaccine Injury Compensation Program (VICP) is a federal program that was created to compensate people who may have been injured by certain vaccines. Persons who believe they may have been injured by a vaccine can learn about the program and about filing a claim by calling 1-4028693734 or visiting the VICP website at SpiritualWord.at. There is a time limit to file a claim for compensation. 7. How can I learn more?  Ask your doctor. He or she can give you the vaccine package insert or suggest other sources of information.  Call your local or state health department.  Contact the  Centers for Disease Control and Prevention (CDC):  Call (757)418-5910 (1-800-CDC-INFO) or  Visit CDC's website at PicCapture.uy CDC Tdap Vaccine VIS (07/20/13) This information is not intended to replace advice given to you by your health care provider. Make sure you discuss any questions you have with your health care provider. Document Released: 11/12/2011 Document Revised: 02/01/2016 Document Reviewed: 02/01/2016 Elsevier Interactive Patient Education  2017 [employer].

## 2016-10-18 NOTE — Progress Notes (Signed)
Patient ID: Michael [name], male    DOB: 1960/07/03  MRN: [id number]  CC: Establish Care and Hypertension   Subjective: Michael [name] is a 56 y.o. male who presents for new pt visit. 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   1.  HTN - out of BP meds x 3 - Norvasc and Lisinopril/HCTZ -does not limit salt as much as he should -no CP/SOB + LE edema RT>LT since being out of meds.  RT lower leg chronically larger than LT  2. Tobacco: 1 pk/day x 40.  Quit 1 time for 1 yr.  Restarted due to stress. Chantix used before, caused rash but did help. -would like to quit   3. RT knee swelling: arthroscopy in early 2000s due to torn meniscus.  -swelling for past few yrs "but not this bad." -endorses pain - throbbing and constant. Worse with walking.  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  He ices knee after work -"can not run like I use to.  My quality of life is bad."  4.  Past Hx Hep C - cured in 2005.  -past hx of ETOH abuse.  Quit many yrs ago -on Methadone 55 mg 4 x a wk.  Can not afford to go every day to Methadone Clinic.  Sometimes, "I have a friend who helps me out." -reports using Roxicodone and heroin a few mths ago so that he can get into Methadone clinic.  Felt he needed the Methadone mainly for pain.   5. HM: had colonoscopy 2-3 yrs ago through [facility].  Does not recall findings.  Told needs f/u in 5 yrs   Patient Active Problem List   Diagnosis Date Noted  . Right rotator cuff tear 01/29/2013     Current Outpatient Prescriptions on File Prior to Visit  Medication Sig Dispense Refill  . amLODipine (NORVASC) 5 MG tablet Take 5 mg by mouth  daily.    [name] ibuprofen (ADVIL,MOTRIN) 200 MG tablet Take 600 mg by mouth every 6 (six) hours as needed for pain.    [name] lisinopril-hydrochlorothiazide (PRINZIDE,ZESTORETIC) 20-25 MG per tablet Take 1 tablet by mouth every evening.    . cyclobenzaprine (FLEXERIL) 10 MG tablet Take 1 tablet (10 mg total) by mouth 2 (two) times daily as needed for muscle spasms. (Patient not taking: Reported on 10/18/2016) 20 tablet 0  . ibuprofen (ADVIL,MOTRIN) 800 MG tablet Take 1 tablet (800 mg total) by mouth 3 (three) times daily. (Patient not taking: Reported on 10/18/2016) 21 tablet 0  . methocarbamol (ROBAXIN) 500 MG tablet Take 1 tablet (500 mg total) by mouth 3 (three) times daily between meals as needed. (Patient not taking: Reported on 06/03/2015) 40 tablet 1  . oxyCODONE (OXY IR/ROXICODONE) 5 MG immediate release tablet 1-2 po q4-6 prn pain (Patient not taking: Reported on 06/03/2015) 60 tablet 0   No current facility-administered medications on file prior to visit.     No Known Allergies  Social History   Social History  . Marital status: Single    Spouse name: N/A  . Number of children: N/A  . Years of education: N/A  Occupational History  . Not on file.   Social History Main Topics  . Smoking status: Current Every Day Smoker    Packs/day: 1.00    Years: 37.00    Types: Cigarettes  . Smokeless tobacco: Current User  . Alcohol use 4.8 oz/week    8 Shots of liquor per week     Comment: occ  . Drug use: No     Comment: hx of marijuana and cocaine, LSD  . Sexual activity: Yes    Partners: Female    Birth control/ protection: None   Other Topics Concern  . Not on file   Social History Narrative  . No narrative on file    No family history on file.  Past Surgical History:  Procedure Laterality Date  . left knee arthroscopy     . left rotator cuff surgery     . SHOULDER OPEN ROTATOR CUFF REPAIR Right 01/29/2013   Procedure: RIGHT SHOULDER MINI ROTATOR CUFF REPAIR;  Surgeon: Javier Docker, MD;  Location: [facility];  Service: Orthopedics;  Laterality: Right;  With ANCHORS    ROS: Review of Systems  Constitutional: Negative for appetite change and fatigue.  Respiratory: Negative for shortness of breath.   Cardiovascular: Positive for leg swelling. Negative for chest pain and palpitations.  Gastrointestinal: Negative for abdominal pain.  Genitourinary: Negative for difficulty urinating.  Neurological: Negative for dizziness.  Psychiatric/Behavioral: Negative for dysphoric mood. The patient is not nervous/anxious.     PHYSICAL EXAM: BP (!) 181/97 (BP Location: Right Arm, Patient Position: Sitting, Cuff Size: Large)   Pulse 97   Temp 97.6 F (36.4 C) (Oral)   Resp 20   Ht 5\' 11"  (1.803 m)   Wt 236 lb 12.8 oz (107.4 kg)   SpO2 96%   BMI 33.03 kg/m   Physical Exam  General appearance - alert, well appearing, middle age caucasian male and in no distress Mental status - alert, oriented to person, place, and time, normal mood, behavior, speech, dress, motor activity, and thought processes Eyes - pupils equal and reactive, extraocular eye movements intact Nose - normal and patent, no erythema, discharge or polyps Mouth - mucous membranes moist, pharynx normal without lesions Neck - supple, no significant adenopathy Lymphatics - no palpable lymphadenopathy, no hepatosplenomegaly Chest - clear to auscultation, no wheezes, rales or rhonchi, symmetric air entry Heart - normal rate, regular rhythm, normal S1, S2, no murmurs, rubs, clicks or gallops Abdomen - soft, nontender, nondistended, no masses or organomegaly Musculoskeletal - LT knee: + jt enlargement, + fluid, mild discomfort on passive ROM Extremities - 1+ BL LE and ankle edema Skin - no needle marks noted on arms  Depression screen [facility] 2/9 10/18/2016  Decreased Interest 0  Down, Depressed, Hopeless 0  PHQ - 2 Score 0  Altered sleeping 0  Tired, decreased energy 0  Change in appetite 0  Feeling bad or failure  about yourself  0  Trouble concentrating 0  Moving slowly or fidgety/restless 0  Suicidal thoughts 0  PHQ-9 Score 0   GAD 7 : Generalized Anxiety Score 10/18/2016  Nervous, Anxious, on Edge 0  Control/stop worrying 1  Worry too much - different things 0  Trouble relaxing 0  Restless 0  Easily annoyed or irritable 0  Afraid - awful might happen 0  Total GAD 7 Score 1  Anxiety Difficulty Somewhat difficult     ASSESSMENT AND PLAN: 1. Essential hypertension -not at goal due to being out of meds.  RF given DASH discussed and encouraged - CBC - Comprehensive metabolic panel - Lipid panel - amLODipine (NORVASC) 5 MG tablet; Take 1 tablet (5 mg total) by mouth daily.  Dispense: 90 tablet; Refill: PRN - lisinopril-hydrochlorothiazide (PRINZIDE,ZESTORETIC) 20-25 MG tablet; Take 1 tablet by mouth every evening.  Dispense: 90 tablet; Refill: PRN  2. Tobacco dependence Patient advised to quit smoking. Discussed health risks associated with smoking including lung and other types of cancers, chronic lung diseases and CV risks.. Pt ready to give trail of quitting.  Discussed methods to help quit including quitting cold [place], use of NRT, Chantix and Bupropion.  Will try NRT step down approach.  % mins spent counseling  - nicotine (NICODERM CQ - DOSED IN MG/24 HOURS) 21 mg/24hr patch; Place 1 patch (21 mg total) onto the skin daily.  Dispense: 28 patch; Refill: 0 - nicotine (NICODERM CQ - DOSED IN MG/24 HOURS) 14 mg/24hr patch; Place 1 patch (14 mg total) onto the skin daily.  Dispense: 28 patch; Refill: 0 - nicotine (NICODERM CQ - DOSED IN MG/24 HR) 7 mg/24hr patch; Place 1 patch (7 mg total) onto the skin daily.  Dispense: 28 patch; Refill: 0  3. Hx of hepatitis C -check to see if still in sustain remission - HIV antibody - Hepatitis C RNA quantitative  4. Chronic pain of left knee - diclofenac sodium (VOLTAREN) 1 % GEL; Apply 2 g topically 4 (four) times daily.  Dispense: 100 g;  Refill: 2 - Ambulatory referral to Orthopedic Surgery  5. Bilateral leg edema -likely due to being out of the HCTZ.  No overt symptoms/signs of heart failure  6. Substance abuse in remission -pt decline seeing CLSW today -encouraged to join support groups like NA to help maintain remission  7. Need for Tdap vaccination - Tdap vaccine greater than or equal to 7yo IM   Patient was given the opportunity to ask questions.  Patient verbalized understanding of the plan and was able to repeat key elements of the plan.   No orders of the defined types were placed in this encounter.    Requested Prescriptions    No prescriptions requested or ordered in this encounter    No Follow-up on file.  Jonah Blueeborah Johnson, MD, FACP

## 2016-10-22 ENCOUNTER — Ambulatory Visit: Payer: Self-pay | Attending: Internal Medicine

## 2016-10-22 DIAGNOSIS — Z8619 Personal history of other infectious and parasitic diseases: Secondary | ICD-10-CM | POA: Insufficient documentation

## 2016-10-22 NOTE — Progress Notes (Signed)
Patient here for lab visit only 

## 2016-10-23 ENCOUNTER — Other Ambulatory Visit: Payer: Self-pay | Admitting: Internal Medicine

## 2016-10-23 ENCOUNTER — Encounter: Payer: Self-pay | Admitting: Internal Medicine

## 2016-10-23 DIAGNOSIS — E785 Hyperlipidemia, unspecified: Secondary | ICD-10-CM

## 2016-10-23 LAB — CBC
HEMATOCRIT: 43.5 % (ref 37.5–51.0)
HEMOGLOBIN: 14.1 g/dL (ref 13.0–17.7)
MCH: 30.3 pg (ref 26.6–33.0)
MCHC: 32.4 g/dL (ref 31.5–35.7)
MCV: 94 fL (ref 79–97)
Platelets: 286 10*3/uL (ref 150–379)
RBC: 4.65 x10E6/uL (ref 4.14–5.80)
RDW: 13.8 % (ref 12.3–15.4)
WBC: 5.8 10*3/uL (ref 3.4–10.8)

## 2016-10-23 LAB — COMPREHENSIVE METABOLIC PANEL
A/G RATIO: 1.7 (ref 1.2–2.2)
ALBUMIN: 4.2 g/dL (ref 3.5–5.5)
ALT: 17 IU/L (ref 0–44)
AST: 18 IU/L (ref 0–40)
Alkaline Phosphatase: 80 IU/L (ref 39–117)
BUN/Creatinine Ratio: 21 — ABNORMAL HIGH (ref 9–20)
BUN: 17 mg/dL (ref 6–24)
Bilirubin Total: 0.2 mg/dL (ref 0.0–1.2)
CALCIUM: 9.6 mg/dL (ref 8.7–10.2)
CO2: 25 mmol/L (ref 18–29)
CREATININE: 0.82 mg/dL (ref 0.76–1.27)
Chloride: 99 mmol/L (ref 96–106)
GFR, EST AFRICAN AMERICAN: 116 mL/min/{1.73_m2} (ref >59)
GFR, EST NON AFRICAN AMERICAN: 100 mL/min/{1.73_m2} (ref >59)
Globulin, Total: 2.5 g/dL (ref 1.5–4.5)
Glucose: 87 mg/dL (ref 65–99)
POTASSIUM: 4.8 mmol/L (ref 3.5–5.2)
SODIUM: 138 mmol/L (ref 134–144)
TOTAL PROTEIN: 6.7 g/dL (ref 6.0–8.5)

## 2016-10-23 LAB — HIV ANTIBODY (ROUTINE TESTING W REFLEX): HIV SCREEN 4TH GENERATION: NONREACTIVE

## 2016-10-23 LAB — LIPID PANEL
CHOL/HDL RATIO: 3.8 ratio (ref 0.0–5.0)
Cholesterol, Total: 219 mg/dL — ABNORMAL HIGH (ref 100–199)
HDL: 57 mg/dL (ref >39)
LDL CALC: 136 mg/dL — AB (ref 0–99)
TRIGLYCERIDES: 129 mg/dL (ref 0–149)
VLDL Cholesterol Cal: 26 mg/dL (ref 5–40)

## 2016-10-23 MED ORDER — ATORVASTATIN CALCIUM 10 MG PO TABS: 10.0000 mg | ORAL_TABLET | Freq: Every day | ORAL | 99 refills | Status: DC

## 2016-10-23 MED FILL — ATORVASTATIN 10 MG TABLET: 10 | 30 days supply | Qty: 30 | Fill #0

## 2016-10-23 NOTE — Progress Notes (Signed)
I received pt's old med records [facility], Dr. Farris HasAaron Morrow.  Pt had Pneumovax 05/12/2013. Hx of hyperlipidemia. LT rotator cuff surgery 2006 and LT knee arthroscopy ACL tear 2004.  Hx of incarceration in jail [place] for 8 yrs.

## 2016-10-24 LAB — HCV RNA QUANT: Hepatitis C Quantitation: NOT DETECTED IU/mL

## 2016-10-28 ENCOUNTER — Telehealth: Payer: Self-pay

## 2016-10-28 NOTE — Telephone Encounter (Signed)
Contacted pt to go over lab results pt is aware of results and doesn't have any questions or concerns 

## 2016-11-06 ENCOUNTER — Ambulatory Visit: Payer: Self-pay | Attending: Internal Medicine

## 2016-11-12 MED FILL — AMLODIPINE BESYLATE 5 MG TA: 5 | 30 days supply | Qty: 30 | Fill #1

## 2016-11-12 MED FILL — LISINOPRIL-HCTZ 20-25 MG TA: 20-25 | 30 days supply | Qty: 30 | Fill #1

## 2016-11-22 ENCOUNTER — Encounter (INDEPENDENT_AMBULATORY_CARE_PROVIDER_SITE_OTHER): Payer: Self-pay | Admitting: Orthopaedic Surgery

## 2016-11-22 ENCOUNTER — Ambulatory Visit (INDEPENDENT_AMBULATORY_CARE_PROVIDER_SITE_OTHER): Payer: Self-pay | Admitting: Orthopaedic Surgery

## 2016-11-22 ENCOUNTER — Ambulatory Visit (INDEPENDENT_AMBULATORY_CARE_PROVIDER_SITE_OTHER): Payer: Self-pay

## 2016-11-22 DIAGNOSIS — G8929 Other chronic pain: Secondary | ICD-10-CM

## 2016-11-22 DIAGNOSIS — M25562 Pain in left knee: Secondary | ICD-10-CM

## 2016-11-22 MED ORDER — BUPIVACAINE HCL 0.5 % IJ SOLN
2.0000 mL | INTRAMUSCULAR | Status: AC | PRN
  Administered 2016-11-22: 2 mL via INTRA_ARTICULAR

## 2016-11-22 MED ORDER — METHYLPREDNISOLONE ACETATE 40 MG/ML IJ SUSP
40.0000 mg | INTRAMUSCULAR | Status: AC | PRN
  Administered 2016-11-22: 40 mg via INTRA_ARTICULAR

## 2016-11-22 MED ORDER — LIDOCAINE HCL 1 % IJ SOLN
2.0000 mL | INTRAMUSCULAR | Status: AC | PRN
  Administered 2016-11-22: 2 mL

## 2016-11-22 NOTE — Progress Notes (Addendum)
Office Visit Note   Patient: Michael [name]           Date of Birth: [birth year]           MRN: [id number] Visit Date: 11/22/2016              Requested by: Marcine Matar, MD [address] [place], [place] [address] PCP: Marcine Matar, MD   Assessment & Plan: Visit Diagnoses:  1. Chronic pain of left knee     Plan: Patient has end-stage left knee degenerative joint disease. He is leaning towards having a knee replacement. He was requesting an injection today. I gave him my card and information to read about knee replacement. We talked about the expected postoperative outcomes and recovery. He also is aware of the risks benefits alternatives to surgery he understands. He'll be in touch with [place].  Follow-Up Instructions: Return if symptoms worsen or fail to improve.   Orders:  Orders Placed This Encounter  Procedures  . XR KNEE 3 VIEW LEFT   No orders of the defined types were placed in this encounter.     Procedures: Large Joint Inj Date/Time: 11/22/2016 9:46 AM Performed by: Tarry Kos Authorized by: Tarry Kos   Consent Given by:  Patient Timeout: prior to procedure the correct patient, procedure, and site was verified   Indications:  Pain Location:  Knee Site:  L knee Prep: patient was prepped and draped in usual sterile fashion   Needle Size:  22 G Ultrasound Guidance: No   Fluoroscopic Guidance: No   Arthrogram: No   Medications:  2 mL lidocaine 1 %; 2 mL bupivacaine 0.5 %; 40 mg methylPREDNISolone acetate 40 MG/ML Patient tolerance:  Patient tolerated the procedure well with no immediate complications     Clinical Data: No additional findings.   Subjective: Chief Complaint  Patient presents with  . Left Knee - Pain, Edema    Michael [name] is a 56 year old gentleman comes in constant severe left knee pain that is significantly affecting his ADLs for many years. He is status post left knee scope about 20 years ago. He says that the knee  swells periodically and he's had cortisone injections in the past with good relief. He is leaning towards knee replacement but needs some time to think about it. The pain does not radiate.    Review of Systems  Constitutional: Negative.   All other systems reviewed and are negative.    Objective: Vital Signs: There were no vitals taken for this visit.  Physical Exam  Constitutional: He is oriented to person, place, and time. He appears well-developed and well-nourished.  HENT:  Head: Normocephalic and atraumatic.  Eyes: Pupils are equal, round, and reactive to light.  Neck: Neck supple.  Pulmonary/Chest: Effort normal.  Abdominal: Soft.  Musculoskeletal: Normal range of motion.  Neurological: He is alert and oriented to person, place, and time.  Skin: Skin is warm.  Psychiatric: He has a normal mood and affect. His behavior is normal. Judgment and thought content normal.  Nursing note and vitals reviewed.   Ortho Exam Left knee exam shows good range of motion without joint effusion. Collaterals and cruciate's are stable. Specialty Comments:  No specialty comments available.  Imaging: Xr Knee 3 View Left  Result Date: 11/22/2016 Advanced degenerative joint disease left knee    PMFS History: Patient Active Problem List   Diagnosis Date Noted  . Hyperlipidemia 10/23/2016  . Essential hypertension 10/18/2016  . Tobacco dependence  10/18/2016  . Hx of hepatitis C 10/18/2016  . Chronic pain of left knee 10/18/2016  . Substance abuse in remission 10/18/2016  . Right rotator cuff tear 01/29/2013   Past Medical History:  Diagnosis Date  . Hepatitis    Hep C   . Hypertension     Family History  Problem Relation Age of Onset  . CAD Mother   . Deep vein thrombosis Brother     Past Surgical History:  Procedure Laterality Date  . left knee arthroscopy     . left rotator cuff surgery     . SHOULDER OPEN ROTATOR CUFF REPAIR Right 01/29/2013   Procedure: RIGHT SHOULDER  MINI ROTATOR CUFF REPAIR;  Surgeon: Javier DockerJeffrey C Beane, MD;  Location: [facility];  Service: Orthopedics;  Laterality: Right;  With ANCHORS   Social History   Occupational History  . machinist    Social History Main Topics  . Smoking status: Current Every Day Smoker    Packs/day: 1.00    Years: 37.00    Types: Cigarettes  . Smokeless tobacco: Current User  . Alcohol use 4.8 oz/week    8 Shots of liquor per week     Comment: occ  . Drug use: No     Comment: hx of marijuana and cocaine, LSD  . Sexual activity: Yes    Partners: Female    Birth control/ protection: None

## 2016-12-03 MED FILL — ATORVASTATIN 10 MG TABLET: 10 | 30 days supply | Qty: 30 | Fill #1

## 2016-12-13 MED FILL — AMLODIPINE BESYLATE 5 MG TA: 5 | 30 days supply | Qty: 30 | Fill #2

## 2016-12-13 MED FILL — LISINOPRIL-HCTZ 20-25 MG TA: 20-25 | 30 days supply | Qty: 30 | Fill #2

## 2017-01-02 ENCOUNTER — Ambulatory Visit (INDEPENDENT_AMBULATORY_CARE_PROVIDER_SITE_OTHER): Payer: Self-pay | Admitting: Orthopaedic Surgery

## 2017-01-02 ENCOUNTER — Encounter (INDEPENDENT_AMBULATORY_CARE_PROVIDER_SITE_OTHER): Payer: Self-pay | Admitting: Orthopaedic Surgery

## 2017-01-02 DIAGNOSIS — M1712 Unilateral primary osteoarthritis, left knee: Secondary | ICD-10-CM

## 2017-01-02 MED ORDER — TRAMADOL HCL 50 MG PO TABS: 50.0000 mg | ORAL_TABLET | Freq: Four times a day (QID) | ORAL | 0 refills | Status: DC | PRN

## 2017-01-02 NOTE — Progress Notes (Signed)
Office Visit Note   Patient: Michael MinerScott D [name]           Date of Birth: [birth year]           MRN: [id number] Visit Date: 01/02/2017              Requested by: Marcine MatarJohnson, Deborah B, MD [address] , [place] [id number] PCP: Marcine MatarJohnson, Deborah B, MD   Assessment & Plan: Visit Diagnoses:  1. Unilateral primary osteoarthritis, left knee     Plan: Patient has end-stage degenerative joint disease of his left knee. He like to pursue total knee replacement. We discussed the risks benefits alternatives to surgery and he wishes to proceed. He denies any history of DVT or neck or allergy. He is currently smoking a cigarettes a day. He does have a history of opioid addiction from his rotator cuff surgery. We will try to manage his pain through nonnarcotic means. He states that toward are really helped. Tramadol was prescribed today.  Follow-Up Instructions: Return for 2 week postop visit.   Orders:  No orders of the defined types were placed in this encounter.  Meds ordered this encounter  Medications  . traMADol (ULTRAM) 50 MG tablet    Sig: Take 1 tablet (50 mg total) by mouth every 6 (six) hours as needed.    Dispense:  60 tablet    Refill:  0      Procedures: No procedures performed   Clinical Data: No additional findings.   Subjective: Chief Complaint  Patient presents with  . Left Knee - Pain, Follow-up    Michael PicketScott comes in today for follow-up of his left knee degenerative joint disease. Injection gave him partial relief. He is interested in having a total knee replacement at this point. He is having lots of difficulty with his work and with ADLs. Denies any numbness and tingling.    Review of Systems  Constitutional: Negative.   All other systems reviewed and are negative.    Objective: Vital Signs: There were no vitals taken for this visit.  Physical Exam  Constitutional: He is oriented to person, place, and time. He appears well-developed and  well-nourished.  Pulmonary/Chest: Effort normal.  Abdominal: Soft.  Neurological: He is alert and oriented to person, place, and time.  Skin: Skin is warm.  Psychiatric: He has a normal mood and affect. His behavior is normal. Judgment and thought content normal.  Nursing note and vitals reviewed.   Ortho Exam Left knee exam shows no joint effusion. Range of motion is 5 to 100. Collaterals and cruciates are stable. Specialty Comments:  No specialty comments available.  Imaging: No results found.   PMFS History: Patient Active Problem List   Diagnosis Date Noted  . Unilateral primary osteoarthritis, left knee 01/02/2017  . Hyperlipidemia 10/23/2016  . Essential hypertension 10/18/2016  . Tobacco dependence 10/18/2016  . Hx of hepatitis C 10/18/2016  . Chronic pain of left knee 10/18/2016  . Substance abuse in remission 10/18/2016  . Right rotator cuff tear 01/29/2013   Past Medical History:  Diagnosis Date  . Hepatitis    Hep C   . Hypertension     Family History  Problem Relation Age of Onset  . CAD Mother   . Deep vein thrombosis Brother     Past Surgical History:  Procedure Laterality Date  . left knee arthroscopy     . left rotator cuff surgery     . SHOULDER OPEN ROTATOR CUFF REPAIR Right 01/29/2013  Procedure: RIGHT SHOULDER MINI ROTATOR CUFF REPAIR;  Surgeon: Javier Docker, MD;  Location: [facility];  Service: Orthopedics;  Laterality: Right;  With ANCHORS   Social History   Occupational History  . machinist    Social History Main Topics  . Smoking status: Current Every Day Smoker    Packs/day: 1.00    Years: 37.00    Types: Cigarettes  . Smokeless tobacco: Current User  . Alcohol use 4.8 oz/week    8 Shots of liquor per week     Comment: occ  . Drug use: No     Comment: hx of marijuana and cocaine, LSD  . Sexual activity: Yes    Partners: Female    Birth control/ protection: None

## 2017-01-07 MED FILL — ?ATORVASTATIN 10 MG TABLET: 10 | 30 days supply | Qty: 30 | Fill #2

## 2017-01-07 MED FILL — traMADol HCL 50 MG TABS: 50 | 15 days supply | Qty: 60 | Fill #0

## 2017-01-10 MED FILL — LISINOPRIL-HCTZ 20-25 MG TA: 20-25 | 30 days supply | Qty: 30 | Fill #3

## 2017-01-10 MED FILL — AMLODIPINE BESYLATE 5 MG TA: 5 | 30 days supply | Qty: 30 | Fill #3

## 2017-01-19 ENCOUNTER — Encounter: Payer: Self-pay | Admitting: Internal Medicine

## 2017-01-19 DIAGNOSIS — Z8619 Personal history of other infectious and parasitic diseases: Secondary | ICD-10-CM | POA: Insufficient documentation

## 2017-01-20 ENCOUNTER — Ambulatory Visit: Payer: Self-pay | Attending: Internal Medicine | Admitting: Internal Medicine

## 2017-01-20 ENCOUNTER — Encounter: Payer: Self-pay | Admitting: Internal Medicine

## 2017-01-20 VITALS — BP 129/79 | HR 86 | Temp 98.4°F | Resp 18 | Ht 71.0 in | Wt 236.0 lb

## 2017-01-20 DIAGNOSIS — Z8619 Personal history of other infectious and parasitic diseases: Secondary | ICD-10-CM | POA: Insufficient documentation

## 2017-01-20 DIAGNOSIS — Z888 Allergy status to other drugs, medicaments and biological substances status: Secondary | ICD-10-CM | POA: Insufficient documentation

## 2017-01-20 DIAGNOSIS — Z23 Encounter for immunization: Secondary | ICD-10-CM

## 2017-01-20 DIAGNOSIS — K029 Dental caries, unspecified: Secondary | ICD-10-CM | POA: Insufficient documentation

## 2017-01-20 DIAGNOSIS — M1712 Unilateral primary osteoarthritis, left knee: Secondary | ICD-10-CM | POA: Insufficient documentation

## 2017-01-20 DIAGNOSIS — E785 Hyperlipidemia, unspecified: Secondary | ICD-10-CM | POA: Insufficient documentation

## 2017-01-20 DIAGNOSIS — E669 Obesity, unspecified: Secondary | ICD-10-CM | POA: Insufficient documentation

## 2017-01-20 DIAGNOSIS — F1911 Other psychoactive substance abuse, in remission: Secondary | ICD-10-CM | POA: Insufficient documentation

## 2017-01-20 DIAGNOSIS — K219 Gastro-esophageal reflux disease without esophagitis: Secondary | ICD-10-CM | POA: Insufficient documentation

## 2017-01-20 DIAGNOSIS — Z79899 Other long term (current) drug therapy: Secondary | ICD-10-CM | POA: Insufficient documentation

## 2017-01-20 DIAGNOSIS — Z8249 Family history of ischemic heart disease and other diseases of the circulatory system: Secondary | ICD-10-CM | POA: Insufficient documentation

## 2017-01-20 DIAGNOSIS — F17200 Nicotine dependence, unspecified, uncomplicated: Secondary | ICD-10-CM

## 2017-01-20 DIAGNOSIS — Z79891 Long term (current) use of opiate analgesic: Secondary | ICD-10-CM | POA: Insufficient documentation

## 2017-01-20 DIAGNOSIS — F17210 Nicotine dependence, cigarettes, uncomplicated: Secondary | ICD-10-CM | POA: Insufficient documentation

## 2017-01-20 DIAGNOSIS — Z6832 Body mass index (BMI) 32.0-32.9, adult: Secondary | ICD-10-CM | POA: Insufficient documentation

## 2017-01-20 DIAGNOSIS — I10 Essential (primary) hypertension: Secondary | ICD-10-CM | POA: Insufficient documentation

## 2017-01-20 NOTE — Progress Notes (Signed)
Patient ID: MAK SNEERINGER, male    DOB: 12/06/1960  MRN: [id number]  CC: Hypertension   Subjective: Michael [name] is a 56 y.o. male who presents for chronic ds management.  Last seen 09/2016. His concerns today include:  Patient is a 56 year old Caucasian male with history of hypertension, Hep C treated with interferon/rib combo in [place] 2005, tobacco abuse, substance use disorder (including cocaine) on Methadone for past 2 mths (states he is on it more so for pain in shoulder and knee), HL (from 1.  OA LT knee -saw Dr. Roda Shutters of [facility] ortho -given steroid inj with very little response. Plan for TKR 02/12/2017. -wears brace intermittently -out of work a few days last wk after he accidentally twisted leg coming down off a block. Knee was swollen and painful. Iced it.  Went back to work this wkend. He is a Chartered certified accountant. Needs note allowing him to go back as of the 8/25.  2. HTN -compliant with meds and trying to limit salt.  "I've cut down a lot on it." -no SOB. CP only with indigestion. Goes away with burping -Very active on his job. No chest pains with activity and with lifting -mother died of MI at age 63.   3. Obesity -not able to exercise as much as he should due to knee  4. Tob dep: got patches. Wears them intermittently. Down to 8-10 cigarettes.  5. HL: did get Lipitor.  Tolreating Okay. Patient Active Problem List   Diagnosis Date Noted  . Hepatitis C virus infection cured after antiviral drug therapy 01/19/2017  . Unilateral primary osteoarthritis, left knee 01/02/2017  . Hyperlipidemia 10/23/2016  . Essential hypertension 10/18/2016  . Tobacco dependence 10/18/2016  . Substance abuse in remission 10/18/2016  . Right rotator cuff tear 01/29/2013     Current Outpatient Prescriptions on File Prior to Visit  Medication Sig Dispense Refill  . amLODipine (NORVASC) 5 MG tablet Take 1 tablet (5 mg total) by mouth daily. 90 tablet PRN  . atorvastatin (LIPITOR) 10 MG tablet  Take 1 tablet (10 mg total) by mouth daily. 90 tablet PRN  . diclofenac sodium (VOLTAREN) 1 % GEL Apply 2 g topically 4 (four) times daily. 100 g 2  . lisinopril-hydrochlorothiazide (PRINZIDE,ZESTORETIC) 20-25 MG tablet Take 1 tablet by mouth every evening. 90 tablet PRN  . methadone (DOLOPHINE) 10 MG tablet Take 55 mg by mouth daily.    . nicotine (NICODERM CQ - DOSED IN MG/24 HOURS) 14 mg/24hr patch Place 1 patch (14 mg total) onto the skin daily. 28 patch 0  . nicotine (NICODERM CQ - DOSED IN MG/24 HOURS) 21 mg/24hr patch Place 1 patch (21 mg total) onto the skin daily. 28 patch 0  . nicotine (NICODERM CQ - DOSED IN MG/24 HR) 7 mg/24hr patch Place 1 patch (7 mg total) onto the skin daily. 28 patch 0  . traMADol (ULTRAM) 50 MG tablet Take 1 tablet (50 mg total) by mouth every 6 (six) hours as needed. 60 tablet 0   No current facility-administered medications on file prior to visit.     Allergies  Allergen Reactions  . Chantix [Varenicline] Itching    Social History   Social History  . Marital status: Single    Spouse name: N/A  . Number of children: 0  . Years of education: 12   Occupational History  . machinist    Social History Main Topics  . Smoking status: Current Every Day Smoker    Packs/day: 0.50  Years: 37.00    Types: Cigarettes  . Smokeless tobacco: Current User  . Alcohol use 4.8 oz/week    8 Shots of liquor per week     Comment: occ  . Drug use: No     Comment: hx of marijuana and cocaine, LSD  . Sexual activity: Yes    Partners: Female    Birth control/ protection: None   Other Topics Concern  . Not on file   Social History Narrative  . No narrative on file    Family History  Problem Relation Age of Onset  . CAD Mother   . Deep vein thrombosis Brother     Past Surgical History:  Procedure Laterality Date  . left knee arthroscopy     . left rotator cuff surgery     . SHOULDER OPEN ROTATOR CUFF REPAIR Right 01/29/2013   Procedure: RIGHT  SHOULDER MINI ROTATOR CUFF REPAIR;  Surgeon: Javier Docker, MD;  Location: [facility];  Service: Orthopedics;  Laterality: Right;  With ANCHORS    ROS: Review of Systems Neg except as above  PHYSICAL EXAM: BP 129/79 (BP Location: Left Arm, Patient Position: Sitting, Cuff Size: Large)   Pulse 86   Temp 98.4 F (36.9 C) (Oral)   Resp 18   Ht 5\' 11"  (1.803 m)   Wt 236 lb (107 kg)   SpO2 98%   BMI 32.92 kg/m   Wt Readings from Last 3 Encounters:  01/20/17 236 lb (107 kg)  10/18/16 236 lb 12.8 oz (107.4 kg)  01/29/13 227 lb (103 kg)    Physical Exam General appearance - alert, well appearing, middle age caucasian male and in no distress Mental status - alert, oriented to person, place, and time, normal mood, behavior, speech, dress, motor activity, and thought processes Eyes - pupils equal and reactive, extraocular eye movements intact Nose - normal and patent, no erythema, discharge or polyps Mouth - mucous membranes moist, pharynx normal without lesions. 2 LT upper molar broken off in gum Neck - supple, no significant adenopathy. No carotids bruits Lymphatics - no cervical or axillary LN Chest - clear to auscultation, no wheezes, rales or rhonchi, symmetric air entry Heart - normal rate, regular rhythm, normal S1, S2, no murmurs, rubs, clicks or gallops Musculoskeletal - LT knee: + jt enlargement, + fluid, mild discomfort on passive ROM Extremities - trace BL LE edema   Results for orders placed or performed in visit on 10/22/16  HCV RNA quant  Result Value Ref Range   Hepatitis C Quantitation HCV Not Detected IU/mL   Test Information Comment    Lab Results  Component Value Date   CHOL 219 (H) 10/22/2016   HDL 57 10/22/2016   LDLCALC 136 (H) 10/22/2016   TRIG 129 10/22/2016   CHOLHDL 3.8 10/22/2016     Chemistry      Component Value Date/Time   NA 138 10/22/2016 0908   K 4.8 10/22/2016 0908   CL 99 10/22/2016 0908   CO2 25 10/22/2016 0908   BUN 17 10/22/2016 0908    CREATININE 0.82 10/22/2016 0908      Component Value Date/Time   CALCIUM 9.6 10/22/2016 0908   ALKPHOS 80 10/22/2016 0908   AST 18 10/22/2016 0908   ALT 17 10/22/2016 0908   BILITOT 0.2 10/22/2016 0908     Lab Results  Component Value Date   WBC 5.8 10/22/2016   HGB 14.1 10/22/2016   HCT 43.5 10/22/2016   MCV 94 10/22/2016  PLT 286 10/22/2016   Depression screen [facility] 2/9 01/20/2017 10/18/2016  Decreased Interest 0 0  Down, Depressed, Hopeless 0 0  PHQ - 2 Score 0 0  Altered sleeping 1 0  Tired, decreased energy 1 0  Change in appetite 1 0  Feeling bad or failure about yourself  0 0  Trouble concentrating 0 0  Moving slowly or fidgety/restless 0 0  Suicidal thoughts 0 0  PHQ-9 Score 3 0     ASSESSMENT AND PLAN: 1. Essential hypertension At goal. Continue Norvasc and lisinopril/HCTZ Stressed the importance of DASH diet  2. Tobacco dependence -Commended him on his efforts to cut back. Encourage him to use the nicotine patches more  3. Primary osteoarthritis of left knee -Followed by awful. Plan for total knee replacement next month  4. Tooth decay Ambulatory referral to Dentistry  5. GERD without esophagitis -GERD precautions discussed. Can use Zantac over-the-counter when necessary  6. Need for influenza vaccination - Flu Vaccine QUAD 6+ mos PF IM (Fluarix Quad PF)  7. Obesity (BMI 30.0-34.9) -Counseling given on healthy eating and regular exercise as tolerated  8. Hyperlipidemia, unspecified hyperlipidemia type - Hepatic function panel  Patient was given the opportunity to ask questions.  Patient verbalized understanding of the plan and was able to repeat key elements of the plan.   Orders Placed This Encounter  Procedures  . Flu Vaccine QUAD 6+ mos PF IM (Fluarix Quad PF)  . Hepatic function panel  . Ambulatory referral to Dentistry     Requested Prescriptions    No prescriptions requested or ordered in this encounter    Return in about 4  months (around 05/22/2017).  Jonah Blue, MD, FACP

## 2017-01-20 NOTE — Patient Instructions (Signed)
Follow a Healthy Eating Plan - You can do it! Limit sugary drinks.  Avoid sodas, sweet tea, sport or energy drinks, or fruit drinks.  Drink water, lo-fat milk, or diet drinks. Limit snack foods.   Cut back on candy, cake, cookies, chips, ice cream.  These are a special treat, only in small amounts. Eat plenty of vegetables.  Especially dark green, red, and orange vegetables. Aim for at least 3 servings a day. More is better! Include fruit in your daily diet.  Whole fruit is much healthier than fruit juice! Limit "white" bread, "white" pasta, "white" rice.   Choose "100% whole grain" products, brown or wild rice. Avoid fatty meats. Try "Meatless Monday" and choose eggs or beans one day a week.  When eating meat, choose lean meats like chicken, [place], and fish.  Grill, broil, or bake meats instead of frying, and eat poultry without the skin. Eat less salt.  Avoid frozen pizzas, frozen dinners and salty foods.  Use seasonings other than salt in cooking.  This can help blood pressure and keep you from swelling Beer, wine and liquor have calories.  If you can safely drink alcohol, limit to 1 drink per day for women, 2 drinks for men   Food Choices for Gastroesophageal Reflux Disease, Adult When you have gastroesophageal reflux disease (GERD), the foods you eat and your eating habits are very important. Choosing the right foods can help ease your discomfort. What guidelines do I need to follow?  Choose fruits, vegetables, whole grains, and low-fat dairy products.  Choose low-fat meat, fish, and poultry.  Limit fats such as oils, salad dressings, butter, nuts, and avocado.  Keep a food diary. This helps you identify foods that cause symptoms.  Avoid foods that cause symptoms. These may be different for everyone.  Eat small meals often instead of 3 large meals a day.  Eat your meals slowly, in a place where you are relaxed.  Limit fried foods.  Cook foods using methods other than  frying.  Avoid drinking alcohol.  Avoid drinking large amounts of liquids with your meals.  Avoid bending over or lying down until 2-3 hours after eating. What foods are not recommended? These are some foods and drinks that may make your symptoms worse: Vegetables Tomatoes. Tomato juice. Tomato and spaghetti sauce. Chili peppers. Onion and garlic. Horseradish. Fruits Oranges, grapefruit, and lemon (fruit and juice). Meats High-fat meats, fish, and poultry. This includes hot dogs, ribs, ham, sausage, salami, and bacon. Dairy Whole milk and chocolate milk. Sour cream. Cream. Butter. Ice cream. Cream cheese. Drinks Coffee and tea. Bubbly (carbonated) drinks or energy drinks. Condiments Hot sauce. Barbecue sauce. Sweets/Desserts Chocolate and cocoa. Donuts. Peppermint and spearmint. Fats and Oils High-fat foods. This includes [place] fries and potato chips. Other Vinegar. Strong spices. This includes black pepper, white pepper, red pepper, cayenne, curry powder, cloves, ginger, and chili powder. The items listed above may not be a complete list of foods and drinks to avoid. Contact your dietitian for more information. This information is not intended to replace advice given to you by your health care provider. Make sure you discuss any questions you have with your health care provider. Document Released: 11/12/2011 Document Revised: 10/19/2015 Document Reviewed: 03/17/2013 Elsevier Interactive Patient Education  2017 [employer].

## 2017-01-21 LAB — HEPATIC FUNCTION PANEL
ALT: 23 IU/L (ref 0–44)
AST: 27 IU/L (ref 0–40)
Albumin: 4.8 g/dL (ref 3.5–5.5)
Alkaline Phosphatase: 83 IU/L (ref 39–117)
Bilirubin Total: 0.4 mg/dL (ref 0.0–1.2)
Bilirubin, Direct: 0.13 mg/dL (ref 0.00–0.40)
Total Protein: 7.3 g/dL (ref 6.0–8.5)

## 2017-01-28 ENCOUNTER — Telehealth: Payer: Self-pay

## 2017-01-28 NOTE — Telephone Encounter (Signed)
Contacted pt to go over lab results pt is aware of results and doesn't have any questions or concerns 

## 2017-02-05 ENCOUNTER — Encounter (HOSPITAL_COMMUNITY)
Admission: RE | Admit: 2017-02-05 | Discharge: 2017-02-05 | Disposition: A | Discharge status: Home / Self Care | Payer: Self-pay | Source: Ambulatory Visit | Attending: Orthopaedic Surgery | Admitting: Orthopaedic Surgery

## 2017-02-05 ENCOUNTER — Encounter (HOSPITAL_COMMUNITY): Payer: Self-pay

## 2017-02-05 ENCOUNTER — Other Ambulatory Visit (INDEPENDENT_AMBULATORY_CARE_PROVIDER_SITE_OTHER): Payer: Self-pay | Admitting: Orthopaedic Surgery

## 2017-02-05 ENCOUNTER — Other Ambulatory Visit (HOSPITAL_COMMUNITY): Payer: Self-pay | Admitting: *Deleted

## 2017-02-05 DIAGNOSIS — Z01812 Encounter for preprocedural laboratory examination: Secondary | ICD-10-CM | POA: Insufficient documentation

## 2017-02-05 DIAGNOSIS — E785 Hyperlipidemia, unspecified: Secondary | ICD-10-CM | POA: Insufficient documentation

## 2017-02-05 DIAGNOSIS — E669 Obesity, unspecified: Secondary | ICD-10-CM | POA: Insufficient documentation

## 2017-02-05 DIAGNOSIS — Z79891 Long term (current) use of opiate analgesic: Secondary | ICD-10-CM | POA: Insufficient documentation

## 2017-02-05 DIAGNOSIS — I10 Essential (primary) hypertension: Secondary | ICD-10-CM | POA: Insufficient documentation

## 2017-02-05 DIAGNOSIS — Z79899 Other long term (current) drug therapy: Secondary | ICD-10-CM | POA: Insufficient documentation

## 2017-02-05 DIAGNOSIS — F17210 Nicotine dependence, cigarettes, uncomplicated: Secondary | ICD-10-CM | POA: Insufficient documentation

## 2017-02-05 DIAGNOSIS — Z6832 Body mass index (BMI) 32.0-32.9, adult: Secondary | ICD-10-CM | POA: Insufficient documentation

## 2017-02-05 DIAGNOSIS — Z01810 Encounter for preprocedural cardiovascular examination: Secondary | ICD-10-CM | POA: Insufficient documentation

## 2017-02-05 DIAGNOSIS — M1712 Unilateral primary osteoarthritis, left knee: Secondary | ICD-10-CM

## 2017-02-05 HISTORY — DX: Unspecified osteoarthritis, unspecified site: M19.90

## 2017-02-05 LAB — COMPREHENSIVE METABOLIC PANEL
ALK PHOS: 72 U/L (ref 38–126)
ALT: 21 U/L (ref 17–63)
AST: 24 U/L (ref 15–41)
Albumin: 3.8 g/dL (ref 3.5–5.0)
Anion gap: 7 (ref 5–15)
BUN: 18 mg/dL (ref 6–20)
CHLORIDE: 105 mmol/L (ref 101–111)
CO2: 23 mmol/L (ref 22–32)
CREATININE: 0.89 mg/dL (ref 0.61–1.24)
Calcium: 9.3 mg/dL (ref 8.9–10.3)
Glucose, Bld: 86 mg/dL (ref 65–99)
Potassium: 4.3 mmol/L (ref 3.5–5.1)
Sodium: 135 mmol/L (ref 135–145)
Total Bilirubin: 0.4 mg/dL (ref 0.3–1.2)
Total Protein: 6.8 g/dL (ref 6.5–8.1)

## 2017-02-05 LAB — CBC
HCT: 40.1 % (ref 39.0–52.0)
HEMOGLOBIN: 13.5 g/dL (ref 13.0–17.0)
MCH: 29.9 pg (ref 26.0–34.0)
MCHC: 33.7 g/dL (ref 30.0–36.0)
MCV: 88.9 fL (ref 78.0–100.0)
Platelets: 243 10*3/uL (ref 150–400)
RBC: 4.51 MIL/uL (ref 4.22–5.81)
RDW: 13.7 % (ref 11.5–15.5)
WBC: 6.3 10*3/uL (ref 4.0–10.5)

## 2017-02-05 LAB — SURGICAL PCR SCREEN
MRSA, PCR: NEGATIVE
STAPHYLOCOCCUS AUREUS: NEGATIVE

## 2017-02-05 NOTE — Pre-Procedure Instructions (Addendum)
[name] D Berendt  02/05/2017    Your procedure is scheduled on Wednesday, February 12, 2017    [facility] Admitting Office at 10:45 AM.   Call this number if you have problems the morning of surgery: [phone]   Questions prior to day of surgery, please call [phone] between 8 & 4 PM.   Remember:  Do not eat food or drink liquids after midnight Tuesday, 02/11/17.  Take these medicines the morning of surgery with A SIP OF WATER: Amlodipine (Norvasc), Methadone (Dolophine), Tramadol - if needed, may use eye drops if needed  STOP all herbel meds, nsaids (aleve,naproxen,advil,ibuprofen)7 prior to surgery Today(02/05/17) including all vitamins/supplements,aspirin.BC,Goodys   Do not wear jewelry.  Do not wear lotions, powders, cologne or deodorant.  Men may shave face and neck.  Do not bring valuables to the hospital.  [facility] is not responsible for any belongings or valuables.  Contacts, dentures or bridgework may not be worn into surgery.  Leave your suitcase in the car.  After surgery it may be brought to your room.  For patients admitted to the hospital, discharge time will be determined by your treatment team.  Patients discharged the day of surgery will not be allowed to drive home.   [place] - Preparing for Surgery  Before surgery, you can play an important role.  Because skin is not sterile, your skin needs to be as free of germs as possible.  You can reduce the number of germs on you skin by washing with CHG (chlorahexidine gluconate) soap before surgery.  CHG is an antiseptic cleaner which kills germs and bonds with the skin to continue killing germs even after washing.  Please DO NOT use if you have an allergy to CHG or antibacterial soaps.  If your skin becomes reddened/irritated stop using the CHG and inform your nurse when you arrive at Short Stay.  Do not shave (including legs and underarms) for at least 48 hours prior to  the first CHG shower.  You may shave your face.  Please follow these instructions carefully:   1.  Shower with CHG Soap the night before surgery and the                    morning of Surgery.  2.  If you choose to wash your hair, wash your hair first as usual with your       normal shampoo.  3.  After you shampoo, rinse your hair and body thoroughly to remove the shampoo.  4.  Use CHG as you would any other liquid soap.  You can apply chg directly       to the skin and wash gently with scrungie or a clean washcloth.  5.  Apply the CHG Soap to your body ONLY FROM THE NECK DOWN.        Do not use on open wounds or open sores.  Avoid contact with your eyes, ears, mouth and genitals (private parts).  Wash genitals (private parts) with your normal soap.  6.  Wash thoroughly, paying special attention to the area where your surgery        will be performed.  7.  Thoroughly rinse your body with warm water from the neck down.  8.  DO NOT shower/wash with your normal soap after using and rinsing off       the CHG Soap.  9.  Pat yourself dry with a clean towel.  10.  Wear clean pajamas.            11.  Place clean sheets on your bed the night of your first shower and do not        sleep with pets.  Day of Surgery  Do not apply any lotions/deodorants the morning of surgery.  Please wear clean clothes to the hospital.   Please read over the fact sheets that you were given.

## 2017-02-05 NOTE — Pre-Procedure Instructions (Signed)
Michael MinerScott D Cuello  02/05/2017    Your procedure is scheduled on Wednesday, February 12, 2017 at 12:45 PM.   [facility] En[facility] "A" Admitting Office at 10:45 AM.   Call this number if you have problems the morning of surgery: [phone]   Questions prior to day of surgery, please call [phone] between 8 & 4 PM.   Remember:  Do not eat food or drink liquids after midnight Tuesday, 02/11/17.  Take these medicines the morning of surgery with A SIP OF WATER: Amlodipine (Norvasc), Methadone (Dolophine), Tramadol - if needed, may use eye drops if needed   Do not wear jewelry.  Do not wear lotions, powders, cologne or deodorant.  Men may shave face and neck.  Do not bring valuables to the hospital.  [facility] is not responsible for any belongings or valuables.  Contacts, dentures or bridgework may not be worn into surgery.  Leave your suitcase in the car.  After surgery it may be brought to your room.  For patients admitted to the hospital, discharge time will be determined by your treatment team.  Patients discharged the day of surgery will not be allowed to drive home.   [place] - Preparing for Surgery  Before surgery, you can play an important role.  Because skin is not sterile, your skin needs to be as free of germs as possible.  You can reduce the number of germs on you skin by washing with CHG (chlorahexidine gluconate) soap before surgery.  CHG is an antiseptic cleaner which kills germs and bonds with the skin to continue killing germs even after washing.  Please DO NOT use if you have an allergy to CHG or antibacterial soaps.  If your skin becomes reddened/irritated stop using the CHG and inform your nurse when you arrive at Short Stay.  Do not shave (including legs and underarms) for at least 48 hours prior to the first CHG shower.  You may shave your face.  Please follow these instructions carefully:   1.  Shower with CHG Soap the night  before surgery and the                    morning of Surgery.  2.  If you choose to wash your hair, wash your hair first as usual with your       normal shampoo.  3.  After you shampoo, rinse your hair and body thoroughly to remove the shampoo.  4.  Use CHG as you would any other liquid soap.  You can apply chg directly       to the skin and wash gently with scrungie or a clean washcloth.  5.  Apply the CHG Soap to your body ONLY FROM THE NECK DOWN.        Do not use on open wounds or open sores.  Avoid contact with your eyes, ears, mouth and genitals (private parts).  Wash genitals (private parts) with your normal soap.  6.  Wash thoroughly, paying special attention to the area where your surgery        will be performed.  7.  Thoroughly rinse your body with warm water from the neck down.  8.  DO NOT shower/wash with your normal soap after using and rinsing off       the CHG Soap.  9.  Pat yourself dry with a clean towel.            10.  Wear clean pajamas.            11.  Place clean sheets on your bed the night of your first shower and do not        sleep with pets.  Day of Surgery  Do not apply any lotions/deodorants the morning of surgery.  Please wear clean clothes to the hospital.   Please read over the fact sheets that you were given.

## 2017-02-07 MED FILL — AMLODIPINE BESYLATE 5 MG TA: 5 | 30 days supply | Qty: 30 | Fill #4

## 2017-02-07 MED FILL — LISINOPRIL-HCTZ 20-25 MG TA: 20-25 | 30 days supply | Qty: 30 | Fill #4

## 2017-02-07 MED FILL — ?ATORVASTATIN 10 MG TABLET: 10 | 30 days supply | Qty: 30 | Fill #3

## 2017-02-10 ENCOUNTER — Other Ambulatory Visit (INDEPENDENT_AMBULATORY_CARE_PROVIDER_SITE_OTHER): Payer: Self-pay | Admitting: Orthopaedic Surgery

## 2017-02-11 MED ORDER — TRANEXAMIC ACID 1000 MG/10ML IV SOLN
1000.0000 mg | INTRAVENOUS | Status: AC
  Administered 2017-02-12: 1000 mg via INTRAVENOUS
  Filled 2017-02-11: qty 10

## 2017-02-12 ENCOUNTER — Inpatient Hospital Stay (HOSPITAL_COMMUNITY)
Admission: RE | Admit: 2017-02-12 | Discharge: 2017-02-14 | DRG: 470 | Disposition: A | Discharge status: Home Health | Payer: Self-pay | Source: Ambulatory Visit | Attending: Orthopaedic Surgery | Admitting: Orthopaedic Surgery

## 2017-02-12 ENCOUNTER — Encounter (HOSPITAL_COMMUNITY): Payer: Self-pay | Admitting: *Deleted

## 2017-02-12 ENCOUNTER — Encounter (HOSPITAL_COMMUNITY)
Admission: RE | Disposition: A | Discharge status: Home Health | Payer: Self-pay | Source: Ambulatory Visit | Attending: Orthopaedic Surgery

## 2017-02-12 ENCOUNTER — Inpatient Hospital Stay (HOSPITAL_COMMUNITY): Payer: Self-pay | Admitting: Anesthesiology

## 2017-02-12 ENCOUNTER — Inpatient Hospital Stay (HOSPITAL_COMMUNITY): Payer: Self-pay

## 2017-02-12 DIAGNOSIS — Z888 Allergy status to other drugs, medicaments and biological substances status: Secondary | ICD-10-CM

## 2017-02-12 DIAGNOSIS — M1712 Unilateral primary osteoarthritis, left knee: Secondary | ICD-10-CM

## 2017-02-12 DIAGNOSIS — I10 Essential (primary) hypertension: Secondary | ICD-10-CM | POA: Diagnosis present

## 2017-02-12 DIAGNOSIS — Z79891 Long term (current) use of opiate analgesic: Secondary | ICD-10-CM

## 2017-02-12 DIAGNOSIS — F17210 Nicotine dependence, cigarettes, uncomplicated: Secondary | ICD-10-CM | POA: Diagnosis present

## 2017-02-12 DIAGNOSIS — D62 Acute posthemorrhagic anemia: Secondary | ICD-10-CM | POA: Diagnosis not present

## 2017-02-12 DIAGNOSIS — Z96659 Presence of unspecified artificial knee joint: Secondary | ICD-10-CM

## 2017-02-12 DIAGNOSIS — Z8249 Family history of ischemic heart disease and other diseases of the circulatory system: Secondary | ICD-10-CM

## 2017-02-12 DIAGNOSIS — Z96652 Presence of left artificial knee joint: Secondary | ICD-10-CM

## 2017-02-12 HISTORY — PX: TOTAL KNEE ARTHROPLASTY: SHX125

## 2017-02-12 LAB — CBC WITH DIFFERENTIAL/PLATELET
Basophils Absolute: 0 10*3/uL (ref 0.0–0.1)
Basophils Relative: 1 %
EOS ABS: 0.2 10*3/uL (ref 0.0–0.7)
EOS PCT: 3 %
HCT: 41.5 % (ref 39.0–52.0)
Hemoglobin: 13.7 g/dL (ref 13.0–17.0)
LYMPHS ABS: 1.7 10*3/uL (ref 0.7–4.0)
LYMPHS PCT: 26 %
MCH: 29.8 pg (ref 26.0–34.0)
MCHC: 33 g/dL (ref 30.0–36.0)
MCV: 90.2 fL (ref 78.0–100.0)
MONOS PCT: 6 %
Monocytes Absolute: 0.4 10*3/uL (ref 0.1–1.0)
Neutro Abs: 4.4 10*3/uL (ref 1.7–7.7)
Neutrophils Relative %: 64 %
Platelets: 265 10*3/uL (ref 150–400)
RBC: 4.6 MIL/uL (ref 4.22–5.81)
RDW: 14 % (ref 11.5–15.5)
WBC: 6.8 10*3/uL (ref 4.0–10.5)

## 2017-02-12 LAB — TYPE AND SCREEN
ABO/RH(D): B POS
Antibody Screen: NEGATIVE

## 2017-02-12 LAB — PROTIME-INR
INR: 0.86
PROTHROMBIN TIME: 11.7 s (ref 11.4–15.2)

## 2017-02-12 LAB — APTT: aPTT: 30 seconds (ref 24–36)

## 2017-02-12 LAB — C-REACTIVE PROTEIN

## 2017-02-12 LAB — SEDIMENTATION RATE: Sed Rate: 18 mm/hr — ABNORMAL HIGH (ref 0–16)

## 2017-02-12 LAB — ABO/RH: ABO/RH(D): B POS

## 2017-02-12 SURGERY — ARTHROPLASTY, KNEE, TOTAL
Anesthesia: Spinal | Site: Knee | Laterality: Left

## 2017-02-12 MED ORDER — PROPOFOL 500 MG/50ML IV EMUL
INTRAVENOUS | Status: DC | PRN
  Administered 2017-02-12: 50 ug/kg/min via INTRAVENOUS

## 2017-02-12 MED ORDER — ONDANSETRON HCL 4 MG/2ML IJ SOLN: 4.0000 mg | Freq: Four times a day (QID) | INTRAMUSCULAR | Status: DC | PRN

## 2017-02-12 MED ORDER — ATORVASTATIN CALCIUM 10 MG PO TABS
10.0000 mg | ORAL_TABLET | Freq: Every day | ORAL | Status: DC
Start: 2017-02-13 — End: 2017-02-14
  Administered 2017-02-13: 10 mg via ORAL
  Filled 2017-02-12: qty 1

## 2017-02-12 MED ORDER — LISINOPRIL 20 MG PO TABS
20.0000 mg | ORAL_TABLET | Freq: Every day | ORAL | Status: DC
  Administered 2017-02-13 – 2017-02-14 (×2): 20 mg via ORAL
  Filled 2017-02-12 (×2): qty 1

## 2017-02-12 MED ORDER — MENTHOL 3 MG MT LOZG
1.0000 | LOZENGE | OROMUCOSAL | Status: DC | PRN
Start: 2017-02-12 — End: 2017-02-14

## 2017-02-12 MED ORDER — KETOROLAC TROMETHAMINE 15 MG/ML IJ SOLN
30.0000 mg | Freq: Four times a day (QID) | INTRAMUSCULAR | Status: AC
  Administered 2017-02-12 – 2017-02-14 (×8): 30 mg via INTRAVENOUS
  Filled 2017-02-12 (×9): qty 2

## 2017-02-12 MED ORDER — ACETAMINOPHEN 500 MG PO TABS
1000.0000 mg | ORAL_TABLET | Freq: Four times a day (QID) | ORAL | Status: AC
  Administered 2017-02-12 – 2017-02-13 (×4): 1000 mg via ORAL
  Filled 2017-02-12 (×4): qty 2

## 2017-02-12 MED ORDER — EPHEDRINE SULFATE-NACL 50-0.9 MG/10ML-% IV SOSY
PREFILLED_SYRINGE | INTRAVENOUS | Status: DC | PRN
Start: 2017-02-12 — End: 2017-02-12
  Administered 2017-02-12 (×2): 10 mg via INTRAVENOUS

## 2017-02-12 MED ORDER — MIDAZOLAM HCL 2 MG/2ML IJ SOLN
INTRAMUSCULAR | Status: AC
  Administered 2017-02-12: 2 mg
  Filled 2017-02-12: qty 2

## 2017-02-12 MED ORDER — OXYCODONE HCL 5 MG PO TABS: 5.0000 mg | ORAL_TABLET | Freq: Three times a day (TID) | ORAL | 0 refills | Status: DC | PRN

## 2017-02-12 MED ORDER — BUPIVACAINE LIPOSOME 1.3 % IJ SUSP
INTRAMUSCULAR | Status: DC | PRN
  Administered 2017-02-12: 20 mL

## 2017-02-12 MED ORDER — HYDROMORPHONE HCL 1 MG/ML IJ SOLN
0.2500 mg | INTRAMUSCULAR | Status: DC | PRN
  Administered 2017-02-12 (×4): 0.5 mg via INTRAVENOUS

## 2017-02-12 MED ORDER — SODIUM CHLORIDE 0.9 % IR SOLN
Status: DC | PRN
Start: 2017-02-12 — End: 2017-02-12
  Administered 2017-02-12: 3000 mL

## 2017-02-12 MED ORDER — PROMETHAZINE HCL 25 MG/ML IJ SOLN: 6.2500 mg | INTRAMUSCULAR | Status: DC | PRN

## 2017-02-12 MED ORDER — TRAMADOL HCL 50 MG PO TABS: 50.0000 mg | ORAL_TABLET | Freq: Three times a day (TID) | ORAL | 3 refills | Status: DC | PRN

## 2017-02-12 MED ORDER — ONDANSETRON HCL 4 MG/2ML IJ SOLN
INTRAMUSCULAR | Status: DC | PRN
  Administered 2017-02-12: 4 mg via INTRAVENOUS

## 2017-02-12 MED ORDER — ASPIRIN EC 325 MG PO TBEC: 325.0000 mg | DELAYED_RELEASE_TABLET | Freq: Two times a day (BID) | ORAL | 0 refills | Status: DC

## 2017-02-12 MED ORDER — HYDROCHLOROTHIAZIDE 25 MG PO TABS
25.0000 mg | ORAL_TABLET | Freq: Every day | ORAL | Status: DC
  Administered 2017-02-13 – 2017-02-14 (×2): 25 mg via ORAL
  Filled 2017-02-12 (×2): qty 1

## 2017-02-12 MED ORDER — CEFAZOLIN SODIUM-DEXTROSE 2-4 GM/100ML-% IV SOLN
INTRAVENOUS | Status: AC
Start: 2017-02-12 — End: 2017-02-12
  Filled 2017-02-12: qty 100

## 2017-02-12 MED ORDER — BUPIVACAINE LIPOSOME 1.3 % IJ SUSP
20.0000 mL | INTRAMUSCULAR | Status: DC
  Filled 2017-02-12: qty 20

## 2017-02-12 MED ORDER — BUPIVACAINE IN DEXTROSE 0.75-8.25 % IT SOLN
INTRATHECAL | Status: DC | PRN
  Administered 2017-02-12: 2 mL via INTRATHECAL

## 2017-02-12 MED ORDER — OXYCODONE HCL 5 MG PO TABS: 5.0000 mg | ORAL_TABLET | Freq: Once | ORAL | Status: DC | PRN

## 2017-02-12 MED ORDER — HYDROMORPHONE HCL 1 MG/ML IJ SOLN
INTRAMUSCULAR | Status: AC
  Administered 2017-02-12: 0.5 mg via INTRAVENOUS
  Filled 2017-02-12: qty 1

## 2017-02-12 MED ORDER — LACTATED RINGERS IV SOLN
INTRAVENOUS | Status: DC
  Administered 2017-02-12 (×3): via INTRAVENOUS

## 2017-02-12 MED ORDER — PROMETHAZINE HCL 25 MG PO TABS: 25.0000 mg | ORAL_TABLET | Freq: Four times a day (QID) | ORAL | 1 refills | Status: DC | PRN

## 2017-02-12 MED ORDER — CEFAZOLIN SODIUM-DEXTROSE 2-4 GM/100ML-% IV SOLN
2.0000 g | INTRAVENOUS | Status: AC
  Administered 2017-02-12: 2 g via INTRAVENOUS

## 2017-02-12 MED ORDER — KETOROLAC TROMETHAMINE 30 MG/ML IJ SOLN
INTRAMUSCULAR | Status: AC
  Filled 2017-02-12: qty 1

## 2017-02-12 MED ORDER — MORPHINE SULFATE (PF) 4 MG/ML IV SOLN
1.0000 mg | INTRAVENOUS | Status: DC | PRN
  Administered 2017-02-12 – 2017-02-14 (×9): 1 mg via INTRAVENOUS
  Filled 2017-02-12 (×10): qty 1

## 2017-02-12 MED ORDER — TRANEXAMIC ACID 1000 MG/10ML IV SOLN
1000.0000 mg | Freq: Once | INTRAVENOUS | Status: AC
  Administered 2017-02-12: 1000 mg via INTRAVENOUS
  Filled 2017-02-12: qty 10

## 2017-02-12 MED ORDER — ONDANSETRON HCL 4 MG PO TABS: 4.0000 mg | ORAL_TABLET | Freq: Four times a day (QID) | ORAL | Status: DC | PRN

## 2017-02-12 MED ORDER — OXYCODONE HCL ER 15 MG PO T12A
15.0000 mg | EXTENDED_RELEASE_TABLET | Freq: Two times a day (BID) | ORAL | Status: DC
  Administered 2017-02-12 – 2017-02-14 (×4): 15 mg via ORAL
  Filled 2017-02-12 (×4): qty 1

## 2017-02-12 MED ORDER — TRANEXAMIC ACID 1000 MG/10ML IV SOLN
2000.0000 mg | INTRAVENOUS | Status: DC
  Filled 2017-02-12: qty 20

## 2017-02-12 MED ORDER — METOCLOPRAMIDE HCL 5 MG PO TABS: 5.0000 mg | ORAL_TABLET | Freq: Three times a day (TID) | ORAL | Status: DC | PRN

## 2017-02-12 MED ORDER — MIDAZOLAM HCL 2 MG/2ML IJ SOLN
INTRAMUSCULAR | Status: AC
  Filled 2017-02-12: qty 2

## 2017-02-12 MED ORDER — PHENOL 1.4 % MT LIQD: 1.0000 | OROMUCOSAL | Status: DC | PRN

## 2017-02-12 MED ORDER — METOCLOPRAMIDE HCL 5 MG/ML IJ SOLN: 5.0000 mg | Freq: Three times a day (TID) | INTRAMUSCULAR | Status: DC | PRN

## 2017-02-12 MED ORDER — MIDAZOLAM HCL 2 MG/2ML IJ SOLN
INTRAMUSCULAR | Status: DC | PRN
  Administered 2017-02-12: 2 mg via INTRAVENOUS

## 2017-02-12 MED ORDER — ROPIVACAINE HCL 5 MG/ML IJ SOLN
INTRAMUSCULAR | Status: DC | PRN
  Administered 2017-02-12: 20 mL via PERINEURAL

## 2017-02-12 MED ORDER — ACETAMINOPHEN 650 MG RE SUPP: 650.0000 mg | Freq: Four times a day (QID) | RECTAL | Status: DC | PRN

## 2017-02-12 MED ORDER — LISINOPRIL-HYDROCHLOROTHIAZIDE 20-25 MG PO TABS: 1.0000 | ORAL_TABLET | Freq: Every evening | ORAL | Status: DC

## 2017-02-12 MED ORDER — OXYCODONE HCL 5 MG/5ML PO SOLN: 5.0000 mg | Freq: Once | ORAL | Status: DC | PRN

## 2017-02-12 MED ORDER — DIPHENHYDRAMINE HCL 12.5 MG/5ML PO ELIX: 25.0000 mg | ORAL_SOLUTION | ORAL | Status: DC | PRN

## 2017-02-12 MED ORDER — CEFAZOLIN SODIUM-DEXTROSE 2-4 GM/100ML-% IV SOLN
2.0000 g | Freq: Three times a day (TID) | INTRAVENOUS | Status: AC
  Administered 2017-02-12 – 2017-02-13 (×3): 2 g via INTRAVENOUS
  Filled 2017-02-12 (×3): qty 100

## 2017-02-12 MED ORDER — METHOCARBAMOL 500 MG PO TABS
500.0000 mg | ORAL_TABLET | Freq: Four times a day (QID) | ORAL | Status: DC | PRN
  Administered 2017-02-12 – 2017-02-14 (×7): 500 mg via ORAL
  Filled 2017-02-12 (×7): qty 1

## 2017-02-12 MED ORDER — AMLODIPINE BESYLATE 5 MG PO TABS
5.0000 mg | ORAL_TABLET | Freq: Every day | ORAL | Status: DC
Start: 2017-02-13 — End: 2017-02-14
  Administered 2017-02-13 – 2017-02-14 (×2): 5 mg via ORAL
  Filled 2017-02-12 (×2): qty 1

## 2017-02-12 MED ORDER — ONDANSETRON HCL 4 MG PO TABS: 4.0000 mg | ORAL_TABLET | Freq: Three times a day (TID) | ORAL | 0 refills | Status: DC | PRN

## 2017-02-12 MED ORDER — TRANEXAMIC ACID 1000 MG/10ML IV SOLN
2000.0000 mg | INTRAVENOUS | Status: AC
  Administered 2017-02-12: 2000 mg via TOPICAL
  Filled 2017-02-12: qty 20

## 2017-02-12 MED ORDER — CHLORHEXIDINE GLUCONATE 4 % EX LIQD: 60.0000 mL | Freq: Once | CUTANEOUS | Status: DC

## 2017-02-12 MED ORDER — SORBITOL 70 % SOLN: 30.0000 mL | Freq: Every day | Status: DC | PRN

## 2017-02-12 MED ORDER — ACETAMINOPHEN 325 MG PO TABS: 650.0000 mg | ORAL_TABLET | Freq: Four times a day (QID) | ORAL | Status: DC | PRN

## 2017-02-12 MED ORDER — METHADONE HCL 10 MG PO TABS
70.0000 mg | ORAL_TABLET | Freq: Every day | ORAL | Status: DC
  Administered 2017-02-13 – 2017-02-14 (×2): 70 mg via ORAL
  Filled 2017-02-12 (×2): qty 7

## 2017-02-12 MED ORDER — ASPIRIN EC 325 MG PO TBEC
325.0000 mg | DELAYED_RELEASE_TABLET | Freq: Two times a day (BID) | ORAL | Status: DC
  Administered 2017-02-12 – 2017-02-14 (×4): 325 mg via ORAL
  Filled 2017-02-12 (×4): qty 1

## 2017-02-12 MED ORDER — PHENYLEPHRINE 40 MCG/ML (10ML) SYRINGE FOR IV PUSH (FOR BLOOD PRESSURE SUPPORT)
PREFILLED_SYRINGE | INTRAVENOUS | Status: DC | PRN
  Administered 2017-02-12: 40 ug via INTRAVENOUS
  Administered 2017-02-12: 80 ug via INTRAVENOUS

## 2017-02-12 MED ORDER — 0.9 % SODIUM CHLORIDE (POUR BTL) OPTIME
TOPICAL | Status: DC | PRN
  Administered 2017-02-12: 1000 mL

## 2017-02-12 MED ORDER — SODIUM CHLORIDE 0.9 % IV SOLN
INTRAVENOUS | Status: DC
  Administered 2017-02-12: 17:00:00 via INTRAVENOUS

## 2017-02-12 MED ORDER — SENNOSIDES-DOCUSATE SODIUM 8.6-50 MG PO TABS: 1.0000 | ORAL_TABLET | Freq: Every evening | ORAL | 1 refills | Status: DC | PRN

## 2017-02-12 MED ORDER — OXYCODONE HCL 5 MG PO TABS
5.0000 mg | ORAL_TABLET | ORAL | Status: DC | PRN
  Administered 2017-02-12: 10 mg via ORAL
  Administered 2017-02-13 – 2017-02-14 (×9): 15 mg via ORAL
  Filled 2017-02-12 (×9): qty 3
  Filled 2017-02-12: qty 2

## 2017-02-12 MED ORDER — LIDOCAINE HCL (CARDIAC) 20 MG/ML IV SOLN
INTRAVENOUS | Status: DC | PRN
  Administered 2017-02-12: 60 mg via INTRAVENOUS

## 2017-02-12 MED ORDER — METHOCARBAMOL 750 MG PO TABS: 750.0000 mg | ORAL_TABLET | Freq: Two times a day (BID) | ORAL | 0 refills | Status: DC | PRN

## 2017-02-12 MED ORDER — NICOTINE 21 MG/24HR TD PT24
21.0000 mg | MEDICATED_PATCH | Freq: Every day | TRANSDERMAL | Status: DC
  Administered 2017-02-13 – 2017-02-14 (×2): 21 mg via TRANSDERMAL
  Filled 2017-02-12 (×2): qty 1

## 2017-02-12 MED ORDER — ALUM & MAG HYDROXIDE-SIMETH 200-200-20 MG/5ML PO SUSP: 30.0000 mL | ORAL | Status: DC | PRN

## 2017-02-12 MED ORDER — MAGNESIUM CITRATE PO SOLN: 1.0000 | Freq: Once | ORAL | Status: DC | PRN

## 2017-02-12 MED ORDER — METHOCARBAMOL 1000 MG/10ML IJ SOLN
500.0000 mg | Freq: Four times a day (QID) | INTRAVENOUS | Status: DC | PRN
  Filled 2017-02-12: qty 5

## 2017-02-12 MED ORDER — FENTANYL CITRATE (PF) 100 MCG/2ML IJ SOLN
INTRAMUSCULAR | Status: AC
  Administered 2017-02-12: 100 ug
  Filled 2017-02-12: qty 2

## 2017-02-12 MED ORDER — DEXAMETHASONE SODIUM PHOSPHATE 10 MG/ML IJ SOLN
INTRAMUSCULAR | Status: DC | PRN
  Administered 2017-02-12: 5 mg via INTRAVENOUS

## 2017-02-12 MED ORDER — POLYETHYLENE GLYCOL 3350 17 G PO PACK
17.0000 g | PACK | Freq: Every day | ORAL | Status: DC | PRN
Start: 2017-02-12 — End: 2017-02-14

## 2017-02-12 MED ORDER — FENTANYL CITRATE (PF) 250 MCG/5ML IJ SOLN
INTRAMUSCULAR | Status: DC | PRN
  Administered 2017-02-12: 50 ug via INTRAVENOUS

## 2017-02-12 MED ORDER — DEXAMETHASONE SODIUM PHOSPHATE 10 MG/ML IJ SOLN
10.0000 mg | Freq: Once | INTRAMUSCULAR | Status: AC
  Administered 2017-02-13: 10 mg via INTRAVENOUS
  Filled 2017-02-12: qty 1

## 2017-02-12 MED ORDER — FENTANYL CITRATE (PF) 250 MCG/5ML IJ SOLN
INTRAMUSCULAR | Status: AC
  Filled 2017-02-12: qty 5

## 2017-02-12 SURGICAL SUPPLY — 67 items
ALCOHOL ISOPROPYL (RUBBING) (MISCELLANEOUS) ×3 IMPLANT
APL SKNCLS STERI-STRIP NONHPOA (GAUZE/BANDAGES/DRESSINGS)
BAG DECANTER FOR FLEXI CONT (MISCELLANEOUS) ×3 IMPLANT
BANDAGE ACE 6X5 VEL STRL LF (GAUZE/BANDAGES/DRESSINGS) ×6 IMPLANT
BANDAGE ESMARK 6X9 LF (GAUZE/BANDAGES/DRESSINGS) ×1 IMPLANT
BENZOIN TINCTURE PRP APPL 2/3 (GAUZE/BANDAGES/DRESSINGS) ×1 IMPLANT
BLADE SAW SGTL 13.0X1.19X90.0M (BLADE) ×3 IMPLANT
BNDG CMPR 9X6 STRL LF SNTH (GAUZE/BANDAGES/DRESSINGS) ×1
BNDG ESMARK 6X9 LF (GAUZE/BANDAGES/DRESSINGS) ×3
BONE CEMENT PALACOSE (Cement) ×3 IMPLANT
BOWL SMART MIX CTS (DISPOSABLE) ×3 IMPLANT
CAPT KNEE TOTAL 4 ×3 IMPLANT
CEMENT BONE PALACOSE (Cement) IMPLANT
CLOSURE STERI-STRIP 1/2X4 (GAUZE/BANDAGES/DRESSINGS) ×2
CLSR STERI-STRIP ANTIMIC 1/2X4 (GAUZE/BANDAGES/DRESSINGS) ×4 IMPLANT
COVER SURGICAL LIGHT HANDLE (MISCELLANEOUS) ×3 IMPLANT
CUFF TOURNIQUET SINGLE 34IN LL (TOURNIQUET CUFF) ×3 IMPLANT
CUFF TOURNIQUET SINGLE 44IN (TOURNIQUET CUFF) IMPLANT
DRAPE EXTREMITY T 121X128X90 (DRAPE) ×3 IMPLANT
DRAPE HALF SHEET 40X57 (DRAPES) ×3 IMPLANT
DRAPE INCISE IOBAN 66X45 STRL (DRAPES) IMPLANT
DRAPE ORTHO SPLIT 77X108 STRL (DRAPES) ×6
DRAPE SURG 17X11 SM STRL (DRAPES) ×6 IMPLANT
DRAPE SURG ORHT 6 SPLT 77X108 (DRAPES) ×2 IMPLANT
DRSG AQUACEL AG ADV 3.5X14 (GAUZE/BANDAGES/DRESSINGS) ×3 IMPLANT
DURAPREP 26ML APPLICATOR (WOUND CARE) ×8 IMPLANT
ELECT CAUTERY BLADE 6.4 (BLADE) ×3 IMPLANT
ELECT REM PT RETURN 9FT ADLT (ELECTROSURGICAL) ×3
ELECTRODE REM PT RTRN 9FT ADLT (ELECTROSURGICAL) ×1 IMPLANT
GLOVE SKINSENSE NS SZ7.5 (GLOVE) ×2
GLOVE SKINSENSE STRL SZ7.5 (GLOVE) ×1 IMPLANT
GLOVE SURG SYN 7.5  E (GLOVE) ×10
GLOVE SURG SYN 7.5 E (GLOVE) ×5 IMPLANT
GLOVE SURG SYN 7.5 PF PI (GLOVE) ×4 IMPLANT
GOWN STRL REIN XL XLG (GOWN DISPOSABLE) ×3 IMPLANT
GOWN STRL REUS W/ TWL LRG LVL3 (GOWN DISPOSABLE) ×1 IMPLANT
GOWN STRL REUS W/TWL LRG LVL3 (GOWN DISPOSABLE) ×6
HANDPIECE INTERPULSE COAX TIP (DISPOSABLE) ×3
HOOD PEEL AWAY FLYTE STAYCOOL (MISCELLANEOUS) ×6 IMPLANT
KIT BASIN OR (CUSTOM PROCEDURE TRAY) ×3 IMPLANT
KIT ROOM TURNOVER OR (KITS) ×3 IMPLANT
KNEE CAPITATED TOTAL 4 IMPLANT
MANIFOLD NEPTUNE II (INSTRUMENTS) ×3 IMPLANT
MARKER SKIN DUAL TIP RULER LAB (MISCELLANEOUS) ×3 IMPLANT
NDL SPNL 18GX3.5 QUINCKE PK (NEEDLE) ×1 IMPLANT
NEEDLE SPNL 18GX3.5 QUINCKE PK (NEEDLE) ×3 IMPLANT
NS IRRIG 1000ML POUR BTL (IV SOLUTION) ×3 IMPLANT
PACK TOTAL JOINT (CUSTOM PROCEDURE TRAY) ×3 IMPLANT
PAD ARMBOARD 7.5X6 YLW CONV (MISCELLANEOUS) ×8 IMPLANT
SAW OSC TIP CART 19.5X105X1.3 (SAW) ×3 IMPLANT
SET HNDPC FAN SPRY TIP SCT (DISPOSABLE) ×1 IMPLANT
STAPLER VISISTAT 35W (STAPLE) IMPLANT
SUCTION FRAZIER HANDLE 10FR (MISCELLANEOUS) ×2
SUCTION TUBE FRAZIER 10FR DISP (MISCELLANEOUS) ×1 IMPLANT
SUT ETHILON 2 0 FS 18 (SUTURE) IMPLANT
SUT MNCRL AB 4-0 PS2 18 (SUTURE) ×2 IMPLANT
SUT VIC AB 0 CT1 27 (SUTURE) ×6
SUT VIC AB 0 CT1 27XBRD ANBCTR (SUTURE) ×2 IMPLANT
SUT VIC AB 1 CTX 27 (SUTURE) ×9 IMPLANT
SUT VIC AB 2-0 CT1 27 (SUTURE) ×9
SUT VIC AB 2-0 CT1 TAPERPNT 27 (SUTURE) ×3 IMPLANT
SYR 50ML LL SCALE MARK (SYRINGE) ×3 IMPLANT
TOWEL OR 17X24 6PK STRL BLUE (TOWEL DISPOSABLE) ×3 IMPLANT
TOWEL OR 17X26 10 PK STRL BLUE (TOWEL DISPOSABLE) ×3 IMPLANT
TRAY CATH 16FR W/PLASTIC CATH (SET/KITS/TRAYS/PACK) IMPLANT
UNDERPAD 30X30 (UNDERPADS AND DIAPERS) ×3 IMPLANT
WRAP KNEE MAXI GEL POST OP (GAUZE/BANDAGES/DRESSINGS) ×3 IMPLANT

## 2017-02-12 NOTE — H&P (Signed)
PREOPERATIVE H&P  Chief Complaint: left knee degenerative joint disease  HPI: Michael [name] is a 56 y.o. male who presents for surgical treatment of left knee degenerative joint disease.  He denies any changes in medical history.  Past Medical History:  Diagnosis Date  . Arthritis   . Hepatitis    Hep C rx  . Hypertension    Past Surgical History:  Procedure Laterality Date  . left knee arthroscopy     . left rotator cuff surgery     . SHOULDER OPEN ROTATOR CUFF REPAIR Right 01/29/2013   Procedure: RIGHT SHOULDER MINI ROTATOR CUFF REPAIR;  Surgeon: Javier Docker, MD;  Location: [facility];  Service: Orthopedics;  Laterality: Right;  With ANCHORS   Social History   Social History  . Marital status: Single    Spouse name: N/A  . Number of children: 0  . Years of education: 12   Occupational History  . machinist    Social History Main Topics  . Smoking status: Current Every Day Smoker    Packs/day: 0.50    Years: 37.00    Types: Cigarettes  . Smokeless tobacco: Never Used  . Alcohol use Yes     Comment: occ  . Drug use: No     Comment: hx of marijuana and cocaine, LSD  . Sexual activity: Yes    Partners: Female    Birth control/ protection: None   Other Topics Concern  . Not on file   Social History Narrative  . No narrative on file   Family History  Problem Relation Age of Onset  . CAD Mother   . Deep vein thrombosis Brother    Allergies  Allergen Reactions  . Chantix [Varenicline] Itching and Rash   Prior to Admission medications   Medication Sig Start Date End Date Taking? Authorizing Provider  amLODipine (NORVASC) 5 MG tablet Take 1 tablet (5 mg total) by mouth daily. 10/18/16  Yes Marcine Matar, MD  atorvastatin (LIPITOR) 10 MG tablet Take 1 tablet (10 mg total) by mouth daily. 10/23/16  Yes Marcine Matar, MD  lisinopril-hydrochlorothiazide (PRINZIDE,ZESTORETIC) 20-25 MG tablet Take 1 tablet by mouth every evening. Patient taking  differently: Take 1 tablet by mouth daily.  10/18/16  Yes Marcine Matar, MD  methadone (DOLOPHINE) 10 MG tablet Take 70 mg by mouth daily.    Yes [provider]  Naphazoline HCl (CLEAR EYES OP) Place 1 drop into both eyes every 4 (four) hours as needed (dry eyes).   Yes [provider]  nicotine (NICODERM CQ - DOSED IN MG/24 HOURS) 21 mg/24hr patch Place 1 patch (21 mg total) onto the skin daily. 10/18/16  Yes Marcine Matar, MD  traMADol (ULTRAM) 50 MG tablet Take 1 tablet (50 mg total) by mouth every 6 (six) hours as needed. Patient taking differently: Take 50 mg by mouth every 6 (six) hours as needed for moderate pain.  01/02/17  Yes Tarry Kos, MD  diclofenac sodium (VOLTAREN) 1 % GEL Apply 2 g topically 4 (four) times daily. Patient not taking: Reported on 02/03/2017 10/18/16   Marcine Matar, MD  nicotine (NICODERM CQ - DOSED IN MG/24 HOURS) 14 mg/24hr patch Place 1 patch (14 mg total) onto the skin daily. Patient not taking: Reported on 02/03/2017 10/18/16   Marcine Matar, MD  nicotine (NICODERM CQ - DOSED IN MG/24 HR) 7 mg/24hr patch Place 1 patch (7 mg total) onto the skin daily. Patient not  taking: Reported on 02/03/2017 10/18/16   Marcine Matar, MD     Positive ROS: All other systems have been reviewed and were otherwise negative with the exception of those mentioned in the HPI and as above.  Physical Exam: General: Alert, no acute distress Cardiovascular: No pedal edema Respiratory: No cyanosis, no use of accessory musculature GI: abdomen soft Skin: No lesions in the area of chief complaint Neurologic: Sensation intact distally Psychiatric: Patient is competent for consent with normal mood and affect Lymphatic: no lymphedema  MUSCULOSKELETAL: exam stable  Assessment: left knee degenerative joint disease  Plan: Plan for Procedure(s): LEFT TOTAL KNEE ARTHROPLASTY  The risks benefits and alternatives were discussed with the patient  including but not limited to the risks of nonoperative treatment, versus surgical intervention including infection, bleeding, nerve injury,  blood clots, cardiopulmonary complications, morbidity, mortality, among others, and they were willing to proceed.   Glee Arvin, MD   02/12/2017 9:31 AM

## 2017-02-12 NOTE — Anesthesia Preprocedure Evaluation (Signed)
Anesthesia Evaluation  Patient identified by MRN, date of birth, ID band Patient awake    Reviewed: Allergy & Precautions, H&P , NPO status , Patient's Chart, lab work & pertinent test results  Airway Mallampati: II  TM Distance: >3 FB Neck ROM: Full    Dental no notable dental hx.    Pulmonary Current Smoker,    Pulmonary exam normal breath sounds clear to auscultation       Cardiovascular Exercise Tolerance: Good hypertension, Pt. on medications Normal cardiovascular exam Rhythm:Regular Rate:Normal     Neuro/Psych negative neurological ROS  negative psych ROS   GI/Hepatic (+) Hepatitis -, BS/p interferon. No current liver problems. LFT normal.   Endo/Other  negative endocrine ROS  Renal/GU negative Renal ROS  negative genitourinary   Musculoskeletal negative musculoskeletal ROS (+) Arthritis , Osteoarthritis,    Abdominal (+) + obese,   Peds negative pediatric ROS (+)  Hematology negative hematology ROS (+)   Anesthesia Other Findings   Reproductive/Obstetrics negative OB ROS                             Anesthesia Physical  Anesthesia Plan  ASA: II  Anesthesia Plan: Spinal   Post-op Pain Management: GA combined w/ Regional for post-op pain   Induction: Intravenous  PONV Risk Score and Plan: 0 and Ondansetron  Airway Management Planned: Simple Face Mask  Additional Equipment:   Intra-op Plan:   Post-operative Plan:   Informed Consent: I have reviewed the patients History and Physical, chart, labs and discussed the procedure including the risks, benefits and alternatives for the proposed anesthesia with the patient or authorized representative who has indicated his/her understanding and acceptance.   Dental advisory given  Plan Discussed with: CRNA  Anesthesia Plan Comments:         Anesthesia Quick Evaluation

## 2017-02-12 NOTE — Transfer of Care (Signed)
Immediate Anesthesia Transfer of Care Note  Patient: Michael [name]  Procedure(s) Performed: Procedure(s): LEFT TOTAL KNEE ARTHROPLASTY (Left)  Patient Location: PACU  Anesthesia Type:Spinal  Level of Consciousness: awake, alert  and oriented  Airway & Oxygen Therapy: Patient Spontanous Breathing  Post-op Assessment: Report given to RN and Post -op Vital signs reviewed and stable  Post vital signs: Reviewed and stable  Last Vitals:  Vitals:   02/12/17 0948  BP: 130/85  Pulse: 75  Resp: 18  Temp: 36.7 C  SpO2: 95%    Last Pain:  Vitals:   02/12/17 0948  TempSrc: Oral         Complications: No apparent anesthesia complications

## 2017-02-12 NOTE — Anesthesia Procedure Notes (Signed)
Spinal  Patient location during procedure: OR Start time: 02/12/2017 12:08 PM End time: 02/12/2017 12:13 PM Staffing Anesthesiologist: Anitra Lauth RAY Performed: anesthesiologist  Preanesthetic Checklist Completed: patient identified, site marked, surgical consent, pre-op evaluation, timeout performed, IV checked, risks and benefits discussed and monitors and equipment checked Spinal Block Patient position: sitting Prep: ChloraPrep Patient monitoring: heart rate, cardiac monitor, continuous pulse ox and blood pressure Approach: midline Location: L3-4 Injection technique: single-shot Needle Needle type: Quincke  Needle gauge: 22 G Needle length: 9 cm

## 2017-02-12 NOTE — Op Note (Signed)
Total Knee Arthroplasty Procedure Note  Preoperative diagnosis: Left knee osteoarthritis  Postoperative diagnosis:same  Operative procedure: Left total knee arthroplasty. CPT [phone]  Surgeon: N. Glee Arvin, MD  Assistants: April Green, RNFA  Anesthesia: Spinal, regional  Tourniquet time: less than 90 mins  Implants used: Zimmer Persona Uncemented Femur: 9 Tibia: F Patella: 35 mm, 9 thick, cemented Polyethylene: 10 mm  Indication: Michael [name] is a 55 y.o. year old male with a history of knee pain. Having failed conservative management, the patient elected to proceed with a total knee arthroplasty.  We have reviewed the risk and benefits of the surgery and they elected to proceed after voicing understanding.  Procedure:  After informed consent was obtained and understanding of the risk were voiced including but not limited to bleeding, infection, damage to surrounding structures including nerves and vessels, blood clots, leg length inequality and the failure to achieve desired results, the operative extremity was marked with verbal confirmation of the patient in the holding area.   The patient was then brought to the operating room and transported to the operating room table in the supine position.  A tourniquet was applied to the operative extremity around the upper thigh. The operative limb was then prepped and draped in the usual sterile fashion and preoperative antibiotics were administered.  A time out was performed prior to the start of surgery confirming the correct extremity, preoperative antibiotic administration, as well as team members, implants and instruments available for the case. Correct surgical site was also confirmed with preoperative radiographs. The limb was then elevated for exsanguination and the tourniquet was inflated. A midline incision was made and a standard medial parapatellar approach was performed.  The patella was prepared and sized to a 35 mm.  A  cover was placed on the patella for protection from retractors.  We then turned our attention to the femur. Posterior cruciate ligament was sacrificed. Start site was drilled in the femur and the intramedullary distal femoral cutting guide was placed, set at 5 degrees valgus, taking 11 mm of distal resection. The distal cut was made. Osteophytes were then removed. Next, the proximal tibial cutting guide was placed with appropriate slope, varus/valgus alignment and depth of resection. The proximal tibial cut was made. Gap blocks were then used to assess the extension gap and alignment, and appropriate soft tissue releases were performed. Attention was turned back to the femur, which was sized using the sizing guide to a size 9. Appropriate rotation of the femoral component was determined using epicondylar axis, Whiteside's line, and assessing the flexion gap under ligament tension. The appropriate size 4-in-1 cutting block was placed and cuts were made. Posterior femoral osteophytes and uncapped bone were then removed with the curved osteotome. The tibia was sized for a size F component. The femoral box-cutting guide was placed and prepared for a PS femoral component. Trial components were placed, and stability was checked in full extension, mid-flexion, and deep flexion. Proper tibial rotation was determined and marked.  The patella tracked well without a lateral release. Trial components were then removed and tibial preparation performed. A posterior capsular injection comprising of 20 cc of 1.3% exparel and 40 cc of normal saline was performed for postoperative pain control. The bony surfaces were irrigated with a pulse lavage and then dried. Bone cement was vacuum mixed on the back table, and the patella component sized above was cemented into place. After cement had finished curing, excess cement was removed. The femoral and tibial  components were malleted into place.  The stability of the construct was  re-evaluated throughout a range of motion and found to be acceptable. The trial liner was removed, the knee was copiously irrigated, and the knee was re-evaluated for any excess bone debris. The real polyethylene liner, 10 mm thick, was inserted and checked to ensure the locking mechanism had engaged appropriately. The tourniquet was deflated and hemostasis was achieved. The wound was irrigated with normal saline. A drain was not placed. Capsular closure was performed with a #1 vicryl, subcutaneous fat closed with a 2.0 vicryl suture, then subcutaneous tissue closed with interrupted 2.0 vicryl suture. The skin was then closed with a 3.0 monocryl. A sterile dressing was applied.   The patient was awakened in the operating room and taken to recovery in stable condition. All sponge, needle, and instrument counts were correct at the end of the case.  Position: supine  Complications: none.  Time Out: performed   Drains/Packing: none  Estimated blood loss: none  Returned to Recovery Room: in good condition.   Antibiotics: yes   Mechanical VTE (DVT) Prophylaxis: sequential compression devices, TED thigh-high  Chemical VTE (DVT) Prophylaxis: aspirin  Fluid Replacement  Crystalloid: see anesthesia record Blood: none  FFP: none   Specimens Removed: 1 to pathology   Sponge and Instrument Count Correct? yes   PACU: portable radiograph - knee AP and Lateral   Admission: inpatient status  Plan/RTC: Return in 2 weeks for wound check.   Weight Bearing/Load Lower Extremity: full   N. Glee Arvin, MD [facility] [phone] 2:17 PM

## 2017-02-12 NOTE — Discharge Instructions (Signed)

## 2017-02-12 NOTE — Anesthesia Procedure Notes (Signed)
Anesthesia Regional Block: Adductor canal block   Pre-Anesthetic Checklist: ,, timeout performed, Correct Patient, Correct Site, Correct Laterality, Correct Procedure, Correct Position, site marked, Risks and benefits discussed,  Surgical consent,  Pre-op evaluation,  At surgeon's request and post-op pain management  Laterality: Left  Prep: chloraprep       Needles:  Injection technique: Single-shot  Needle Type: Stimiplex     Needle Length: 9cm  Needle Gauge: 21     Additional Needles:   Procedures:,,,, ultrasound used (permanent image in chart),,,,  Narrative:  Start time: 02/12/2017 10:52 AM End time: 02/12/2017 10:57 AM Injection made incrementally with aspirations every 5 mL.  Performed by: Personally  Anesthesiologist: Anitra Lauth RAY

## 2017-02-12 NOTE — Progress Notes (Signed)
Orthopedic Tech Progress Note Patient Details:  Michael [name] May 17, 1961 [id number]  Patient ID: [name], male   DOB: [birth year], 56 y.o.   MRN: [id number]   [name] 02/12/2017, 3:00 PM ohf not applied because pt's weight exceeds durability of frame; RN notified

## 2017-02-12 NOTE — Anesthesia Procedure Notes (Signed)
Procedure Name: MAC Date/Time: 02/12/2017 12:57 PM Performed by: Teressa Lower Pre-anesthesia Checklist: Patient identified, Emergency Drugs available, Suction available, Patient being monitored and Timeout performed Patient Re-evaluated:Patient Re-evaluated prior to induction Oxygen Delivery Method: Simple face mask

## 2017-02-13 ENCOUNTER — Encounter (HOSPITAL_COMMUNITY): Payer: Self-pay | Admitting: General Practice

## 2017-02-13 LAB — CBC
HCT: 39 % (ref 39.0–52.0)
HEMOGLOBIN: 13 g/dL (ref 13.0–17.0)
MCH: 30 pg (ref 26.0–34.0)
MCHC: 33.3 g/dL (ref 30.0–36.0)
MCV: 90.1 fL (ref 78.0–100.0)
Platelets: 278 10*3/uL (ref 150–400)
RBC: 4.33 MIL/uL (ref 4.22–5.81)
RDW: 13.9 % (ref 11.5–15.5)
WBC: 12.1 10*3/uL — ABNORMAL HIGH (ref 4.0–10.5)

## 2017-02-13 LAB — BASIC METABOLIC PANEL
Anion gap: 8 (ref 5–15)
BUN: 16 mg/dL (ref 6–20)
CALCIUM: 8.9 mg/dL (ref 8.9–10.3)
CHLORIDE: 100 mmol/L — AB (ref 101–111)
CO2: 27 mmol/L (ref 22–32)
CREATININE: 0.79 mg/dL (ref 0.61–1.24)
GFR calc non Af Amer: >60 mL/min (ref >60)
Glucose, Bld: 118 mg/dL — ABNORMAL HIGH (ref 65–99)
Potassium: 4.3 mmol/L (ref 3.5–5.1)
SODIUM: 135 mmol/L (ref 135–145)

## 2017-02-13 NOTE — Progress Notes (Signed)
Physical Therapy Treatment Patient Details Name: Michael [name] MRN: [id number] DOB: 08-Sep-1960 Today's Date: 02/13/2017    History of Present Illness 56 yo admitted for Left TKA. PMHx: arthritis, hep C, HTN    PT Comments    Pt progressing with HEP and gait although continues to struggle with pain but very willing to participate fully. Pt premedicated and receiving drugs end of session. Encouraged ambulation with nursing and continued HEP. Will follow.     Follow Up Recommendations  Home health PT     Equipment Recommendations  Rolling walker with 5" wheels;3in1 (PT)    Recommendations for Other Services OT consult     Precautions / Restrictions Precautions Precautions: Knee Restrictions LLE Weight Bearing: Weight bearing as tolerated    Mobility  Bed Mobility Overal bed mobility: Modified Independent                Transfers Overall transfer level: Modified independent   Transfers: Sit to/from Stand Sit to Stand: Min guard         General transfer comment: cues for hand placement and safety  Ambulation/Gait Ambulation/Gait assistance: Min guard Ambulation Distance (Feet): 170 Feet Assistive device: Rolling walker (2 wheeled) Gait Pattern/deviations: Step-to pattern   Gait velocity interpretation: Below normal speed for age/gender General Gait Details: cues for position in RW, sequence, and heel strike. pt unable to complete step through   Stairs Stairs: Yes   Stair Management: Step to pattern;Backwards;With walker Number of Stairs: 1 General stair comments: cues for sequence with pt able to demonstrate  Wheelchair Mobility    Modified Rankin (Stroke Patients Only)       Balance Overall balance assessment: No apparent balance deficits (not formally assessed)                                          Cognition Arousal/Alertness: Awake/alert Behavior During Therapy: WFL for tasks assessed/performed Overall Cognitive  Status: Within Functional Limits for tasks assessed                                        Exercises Total Joint Exercises  Heel Slides: AAROM;Left;10 reps;Seated Hip ABduction/ADduction: AAROM;Left;Supine;10 reps Straight Leg Raises: AROM;Left;Supine;10 reps Long Arc Quad: AROM;Left;Seated;10 reps Goniometric ROM: 15-80    General Comments        Pertinent Vitals/Pain Pain Assessment: 0-10 Pain Score: 7  Pain Location: left knee Pain Descriptors / Indicators: Aching;Sore Pain Intervention(s): Limited activity within patient's tolerance;Repositioned;Monitored during session;Patient requesting pain meds-RN notified;Premedicated before session    Home Living Family/patient expects to be discharged to:: Private residence Living Arrangements: Non-relatives/Friends Available Help at Discharge: Friend(s);Available PRN/intermittently Type of Home: House Home Access: Stairs to enter   Home Layout: One level Home Equipment: None      Prior Function Level of Independence: Independent      Comments: pt works as a Clinical cytogeneticist (current goals can now be found in the care plan section) Acute Rehab PT Goals Patient Stated Goal: return to work and sports PT Goal Formulation: With patient Time For Goal Achievement: 02/20/17 Potential to Achieve Goals: Good Progress towards PT goals: Progressing toward goals    Frequency    7X/week      PT Plan Current plan remains appropriate    Co-evaluation  AM-PAC PT "6 Clicks" Daily Activity  Outcome Measure  Difficulty turning over in bed (including adjusting bedclothes, sheets and blankets)?: A Little Difficulty moving from lying on back to sitting on the side of the bed? : A Little Difficulty sitting down on and standing up from a chair with arms (e.g., wheelchair, bedside commode, etc,.)?: None Help needed moving to and from a bed to chair (including a wheelchair)?: None Help needed  walking in hospital room?: A Little Help needed climbing 3-5 steps with a railing? : A Little 6 Click Score: 20    End of Session Equipment Utilized During Treatment: Gait belt Activity Tolerance: Patient tolerated treatment well Patient left: in bed;in CPM;with call bell/phone within reach Nurse Communication: Mobility status;Precautions PT Visit Diagnosis: Difficulty in walking, not elsewhere classified (R26.2);Muscle weakness (generalized) (M62.81);Pain Pain - Right/Left: Left Pain - part of body: Knee     Time: 1610-9604 PT Time Calculation (min) (ACUTE ONLY): 24 min  Charges:  $Gait Training: 8-22 mins $Therapeutic Exercise: 8-22 mins                    G Codes:       Delaney Meigs, PT [phone]    Jandy Brackens B Emile Ringgenberg 02/13/2017, 10:57 AM

## 2017-02-13 NOTE — Care Management Note (Signed)
Case Management Note  Patient Details  Name: Michael [name] MRN: [id number] Date of Birth: Oct 22, 1960  Subjective/Objective:   56 yr old gentleman s/p left total knee arthroplasty.                  Action/Plan: Case manager spoke with patient concerning discharge plan and DME. Patient is uninsured, has been setup with [facility], referral was called to Shaune Leeks, [facility] Liaison.     Expected Discharge Date:    02/14/17              Expected Discharge Plan:  Home w Home Health Services  In-House Referral:  NA  Discharge planning Services  CM Consult  Post Acute Care Choice:  Durable Medical Equipment, Home Health Choice offered to:  Patient  DME Arranged:  3-N-1, Walker rolling DME Agency:  [facility]  HH Arranged:  PT [facility] Agency:  [facility]  Status of Service:  Completed, signed off  If discussed at Long Length of Stay Meetings, dates discussed:    Additional Comments:  Durenda Guthrie, RN 02/13/2017, 12:27 PM

## 2017-02-13 NOTE — Therapy (Signed)
Occupational Therapy Evaluation Patient Details Name: Michael [name] MRN: [id number] DOB: 02-10-1961 Today's Date: 02/13/2017    History of Present Illness 56 yo admitted for Left TKA. PMHx: arthritis, hep C, HTN   Clinical Impression   Pt reports working full time and being independent in all ADLs and IADLs PTA. Currently pt requires min guard for functional mobility/tranfers at RW level, supervision/set up for all other ADLs. Pt reports friends are able to assist intermittently as needed up d/c home. OT will follow acutely to address established goals.     Follow Up Recommendations  No OT follow up;Supervision - Intermittent    Equipment Recommendations  3 in 1 bedside commode    Recommendations for Other Services       Precautions / Restrictions Precautions Precautions: Knee Precaution Comments: Reviewed weight bearing status Restrictions Weight Bearing Restrictions: Yes LLE Weight Bearing: Weight bearing as tolerated      Mobility Bed Mobility Overal bed mobility: Modified Independent             General bed mobility comments: Pt able to use RLE to help get his LLE in/out of bed  Transfers Overall transfer level: Needs assistance Equipment used: Rolling walker (2 wheeled) Transfers: Sit to/from Stand Sit to Stand: Min guard         General transfer comment: cues for hand placement and safety    Balance Overall balance assessment: No apparent balance deficits (not formally assessed)                                         ADL either performed or assessed with clinical judgement   ADL Overall ADL's : Needs assistance/impaired     Grooming: Supervision/safety;Set up;Standing;Wash/dry hands   Upper Body Bathing: Supervision/ safety;Set up;Sitting   Lower Body Bathing: Supervison/ safety;Set up;Sit to/from stand   Upper Body Dressing : Supervision/safety;Set up;Sitting   Lower Body Dressing: Supervision/safety;Set up;Sit to/from  stand   Toilet Transfer: Min guard;Ambulation;RW;Grab bars (3 in 1)   Toileting- Clothing Manipulation and Hygiene: Supervision/safety;Sit to/from stand   Tub/ Shower Transfer: Min guard;Ambulation;Shower seat;3 in 1;Rolling walker (3 in 1 or shower seat? )   Functional mobility during ADLs: Min guard;Rolling walker General ADL Comments: Pt educated on compensatory techniques to complete ADLs.      Vision         Perception     Praxis      Pertinent Vitals/Pain Pain Assessment: 0-10 Pain Score: 7  Pain Location: left knee Pain Descriptors / Indicators: Aching;Constant;Sore Pain Intervention(s): Premedicated before session;Limited activity within patient's tolerance;Monitored during session     Hand Dominance     Extremity/Trunk Assessment Upper Extremity Assessment Upper Extremity Assessment: Overall WFL for tasks assessed   Lower Extremity Assessment Lower Extremity Assessment: Defer to PT evaluation LLE Deficits / Details: decreased ROM and strength   Cervical / Trunk Assessment Cervical / Trunk Assessment: Normal   Communication Communication Communication: No difficulties   Cognition Arousal/Alertness: Awake/alert Behavior During Therapy: WFL for tasks assessed/performed Overall Cognitive Status: Within Functional Limits for tasks assessed                                     General Comments  Pt limited by pain during eval but agreeable to treatment.     Exercises  Shoulder Instructions      Home Living Family/patient expects to be discharged to:: Private residence Living Arrangements: Non-relatives/Friends Available Help at Discharge: Friend(s);Available PRN/intermittently Type of Home: House Home Access: Stairs to enter Entrance Stairs-Number of Steps: 1   Home Layout: One level     Bathroom Shower/Tub: Tub/shower unit;Door   [employer] Toilet: Standard Bathroom Accessibility: Yes How Accessible: Accessible via walker Home  Equipment: None          Prior Functioning/Environment Level of Independence: Independent        Comments: Pt works full time Designer, television/film set at [employer], quantity Problem List: Decreased range of motion;Decreased activity tolerance;Decreased knowledge of use of DME or AE;Pain      OT Treatment/Interventions: Self-care/ADL training;Therapeutic activities;Cognitive remediation/compensation    OT Goals(Current goals can be found in the care plan section) Acute Rehab OT Goals Patient Stated Goal: To decrease pain OT Goal Formulation: With patient Time For Goal Achievement: 02/27/17 Potential to Achieve Goals: Good ADL Goals Pt Will Perform Lower Body Bathing: with modified independence;sit to/from stand Pt Will Perform Lower Body Dressing: with modified independence;sit to/from stand Pt Will Perform Tub/Shower Transfer: with modified independence;ambulating;Tub transfer;3 in 1 (tub shower with sliding door)  OT Frequency: Min 2X/week   Barriers to D/C:            Co-evaluation              AM-PAC PT "6 Clicks" Daily Activity     Outcome Measure Help from another person eating meals?: None Help from another person taking care of personal grooming?: None Help from another person toileting, which includes using toliet, bedpan, or urinal?: None Help from another person bathing (including washing, rinsing, drying)?: A Little Help from another person to put on and taking off regular upper body clothing?: None Help from another person to put on and taking off regular lower body clothing?: None 6 Click Score: 23   End of Session Equipment Utilized During Treatment: Gait belt;Rolling walker CPM Left Knee CPM Left Knee: On Left Knee Flexion (Degrees): 80 Left Knee Extension (Degrees): 0 Additional Comments: pt with heel elevated in chair Nurse Communication: Mobility status  Activity Tolerance: Patient tolerated treatment well Patient left: in  bed;with call bell/phone within reach;in CPM  OT Visit Diagnosis: Unsteadiness on feet (R26.81);Pain Pain - Right/Left: Left Pain - part of body: Knee                Time: 1130-1151 OT Time Calculation (min): 21 min Charges:  OT General Charges $OT Visit: 1 Visit OT Evaluation $OT Eval Moderate Complexity: 1 Mod G-Codes:     Cammy Copa, OTS [phone]   Cammy Copa 02/13/2017, 12:15 PM

## 2017-02-13 NOTE — Anesthesia Postprocedure Evaluation (Signed)
Anesthesia Post Note  Patient: Michael [name]  Procedure(s) Performed: Procedure(s) (LRB): LEFT TOTAL KNEE ARTHROPLASTY (Left)     Patient location during evaluation: PACU Anesthesia Type: Spinal Level of consciousness: oriented and awake and alert Pain management: pain level controlled Vital Signs Assessment: post-procedure vital signs reviewed and stable Respiratory status: spontaneous breathing and respiratory function stable Cardiovascular status: blood pressure returned to baseline and stable Postop Assessment: no headache, no backache and no apparent nausea or vomiting Anesthetic complications: no    Last Vitals:  Vitals:   02/13/17 0648 02/13/17 0922  BP: 124/65 136/74  Pulse: (!) 58 65  Resp:  16  Temp: 36.6 C 36.6 C  SpO2: 96% 96%    Last Pain:  Vitals:   02/13/17 0922  TempSrc: Oral  PainSc:                  Lowella Curb

## 2017-02-13 NOTE — Evaluation (Signed)
Physical Therapy Evaluation Patient Details Name: Michael [name] MRN: [id number] DOB: Jun 17, 1960 Today's Date: 02/13/2017   History of Present Illness  56 yo admitted for Left TKA. PMHx: arthritis, hep C, HTN  Clinical Impression  Pt pleasant in CPM on arrival and eager to mobilize. Pt educated for transfers, gait, function, HEP, and progression. Pt with decreased strength, ROM, transfers and gait who will benefit from acute therapy to maximize mobility, function, independence and gait to decrease burden of care and return pt to PLOF.    Follow Up Recommendations Home health PT    Equipment Recommendations  Rolling walker with 5" wheels;3in1 (PT)    Recommendations for Other Services OT consult     Precautions / Restrictions Precautions Precautions: Knee Restrictions LLE Weight Bearing: Weight bearing as tolerated      Mobility  Bed Mobility Overal bed mobility: Modified Independent                Transfers Overall transfer level: Needs assistance   Transfers: Sit to/from Stand Sit to Stand: Min guard         General transfer comment: cues for hand placement and safety  Ambulation/Gait Ambulation/Gait assistance: Min guard Ambulation Distance (Feet): 150 Feet Assistive device: Rolling walker (2 wheeled) Gait Pattern/deviations: Step-to pattern;Trunk flexed   Gait velocity interpretation: Below normal speed for age/gender General Gait Details: cues for posture, position in RW and sequence. Pt unable to advance to step through pattern due to pain  Stairs            Wheelchair Mobility    Modified Rankin (Stroke Patients Only)       Balance Overall balance assessment: No apparent balance deficits (not formally assessed)                                           Pertinent Vitals/Pain Pain Assessment: 0-10 Pain Score: 7  Pain Location: left knee Pain Descriptors / Indicators: Aching;Sore Pain Intervention(s): Limited  activity within patient's tolerance;Repositioned;Monitored during session;Premedicated before session;Ice applied    Home Living Family/patient expects to be discharged to:: Private residence Living Arrangements: Non-relatives/Friends Available Help at Discharge: Friend(s);Available PRN/intermittently Type of Home: House Home Access: Stairs to enter   [employer] of Steps: 1 Home Layout: One level Home Equipment: None      Prior Function Level of Independence: Independent         Comments: pt works as a Actor Extremity Assessment Upper Extremity Assessment: Overall WFL for tasks assessed    Lower Extremity Assessment Lower Extremity Assessment: LLE deficits/detail LLE Deficits / Details: decreased ROM and strength    Cervical / Trunk Assessment Cervical / Trunk Assessment: Normal  Communication   Communication: No difficulties  Cognition Arousal/Alertness: Awake/alert Behavior During Therapy: WFL for tasks assessed/performed Overall Cognitive Status: Within Functional Limits for tasks assessed                                        General Comments      Exercises Total Joint Exercises Ankle Circles/Pumps: AROM;Left;Supine;5 reps Heel Slides: AAROM;Left;Supine;10 reps Hip ABduction/ADduction: AAROM;Left;Supine;10 reps Straight Leg Raises: AROM;Left;Supine;5 reps   Assessment/Plan    PT Assessment Patient needs continued  PT services  PT Problem List Decreased strength;Decreased mobility;Decreased safety awareness;Decreased range of motion;Decreased activity tolerance;Decreased knowledge of use of DME;Pain       PT Treatment Interventions DME instruction;Therapeutic activities;Gait training;Therapeutic exercise;Patient/family education;Functional mobility training    PT Goals (Current goals can be found in the Care Plan section)  Acute Rehab PT Goals Patient  Stated Goal: return to work and sports PT Goal Formulation: With patient Time For Goal Achievement: 02/20/17 Potential to Achieve Goals: Good    Frequency 7X/week   Barriers to discharge Decreased caregiver support      Co-evaluation               AM-PAC PT "6 Clicks" Daily Activity  Outcome Measure Difficulty turning over in bed (including adjusting bedclothes, sheets and blankets)?: A Little Difficulty moving from lying on back to sitting on the side of the bed? : A Little Difficulty sitting down on and standing up from a chair with arms (e.g., wheelchair, bedside commode, etc,.)?: A Little Help needed moving to and from a bed to chair (including a wheelchair)?: A Little Help needed walking in hospital room?: A Little Help needed climbing 3-5 steps with a railing? : A Little 6 Click Score: 18    End of Session Equipment Utilized During Treatment: Gait belt Activity Tolerance: Patient tolerated treatment well Patient left: in chair;with call bell/phone within reach Nurse Communication: Mobility status PT Visit Diagnosis: Difficulty in walking, not elsewhere classified (R26.2);Muscle weakness (generalized) (M62.81);Pain Pain - Right/Left: Left Pain - part of body: Knee    Time: 1610-9604 PT Time Calculation (min) (ACUTE ONLY): 25 min   Charges:   PT Evaluation $PT Eval Moderate Complexity: 1 Mod PT Treatments $Gait Training: 8-22 mins   PT G Codes:        Delaney Meigs, PT [phone]   Levie Owensby B Sareen Randon 02/13/2017, 8:39 AM

## 2017-02-14 MED FILL — METHOCARBAMOL 750 MG TABLET: 750 | 30 days supply | Qty: 60 | Fill #0

## 2017-02-14 NOTE — Progress Notes (Signed)
Occupational Therapy Treatment Patient Details Name: Michael [name] MRN: [id number] DOB: [birth year] Today's Date: 02/14/2017    History of present illness 56 yo admitted for Left TKA. PMHx: arthritis, hep C, HTN   OT comments  Pt progressing well towards goals. Completed functional mobility and tub transfer to 3:1 with MinGuard-supervision this session using RW. Reviewed compensatory techniques for safely completing ADLs after return home. Education provided and questions answered throughout. Feel Pt will safely progress home with intermittent assist PRN. Will continue to follow acutely.    Follow Up Recommendations  No OT follow up;Supervision - Intermittent    Equipment Recommendations  3 in 1 bedside commode          Precautions / Restrictions Precautions Precautions: Knee Precaution Comments: Reviewed weight bearing status Restrictions Weight Bearing Restrictions: Yes LLE Weight Bearing: Weight bearing as tolerated       Mobility Bed Mobility               General bed mobility comments: Pt in recliner on arrival.    Transfers Overall transfer level: Needs assistance Equipment used: Rolling walker (2 wheeled) Transfers: Sit to/from Stand Sit to Stand: Min guard         General transfer comment: close guard for safety; demonstrates good hand placement     Balance Overall balance assessment: No apparent balance deficits (not formally assessed)                                         ADL either performed or assessed with clinical judgement   ADL Overall ADL's : Needs assistance/impaired                       Lower Body Dressing Details (indicate cue type and reason): reviewed compensatory techniques for task completion          Tub/ Shower Transfer: Min guard;Ambulation;3 in 1;Rolling walker;Cueing for sequencing Tub/Shower Transfer Details (indicate cue type and reason): educated on proper sequencing for transfer to 3:1  with Pt return demonstrating with good understanding Functional mobility during ADLs: Rolling walker;Supervision/safety                         Cognition Arousal/Alertness: Awake/alert Behavior During Therapy: WFL for tasks assessed/performed Overall Cognitive Status: Within Functional Limits for tasks assessed                                                           Pertinent Vitals/ Pain       Pain Assessment: Faces Pain Score: 8  Faces Pain Scale: Hurts even more Pain Location: left knee Pain Descriptors / Indicators: Aching;Constant;Sore Pain Intervention(s): Monitored during session;Repositioned;Ice applied                                                          Frequency  Min 2X/week        Progress Toward Goals  OT Goals(current goals can now be found in the care plan section)  Progress towards OT goals: Progressing toward goals  Acute Rehab OT Goals Patient Stated Goal: To decrease pain OT Goal Formulation: With patient Time For Goal Achievement: 02/27/17 Potential to Achieve Goals: Good  Plan Discharge plan remains appropriate                     AM-PAC PT "6 Clicks" Daily Activity     Outcome Measure   Help from another person eating meals?: None Help from another person taking care of personal grooming?: None Help from another person toileting, which includes using toliet, bedpan, or urinal?: None Help from another person bathing (including washing, rinsing, drying)?: A Little Help from another person to put on and taking off regular upper body clothing?: None Help from another person to put on and taking off regular lower body clothing?: None 6 Click Score: 23    End of Session Equipment Utilized During Treatment: Gait belt;Rolling walker  OT Visit Diagnosis: Unsteadiness on feet (R26.81);Pain Pain - Right/Left: Left Pain - part of body: Knee   Activity Tolerance Patient  tolerated treatment well   Patient Left in chair;with call bell/phone within reach   Nurse Communication Mobility status        Time: 1030-1046 OT Time Calculation (min): 16 min  Charges: OT General Charges $OT Visit: 1 Visit OT Treatments $Self Care/Home Management : 8-22 mins  Marcy Siren, OT Pager [phone] 02/14/2017    [name] 02/14/2017, 12:49 PM

## 2017-02-14 NOTE — Progress Notes (Signed)
Physical Therapy Treatment Patient Details Name: Michael [name] MRN: [id number] DOB: 1961-04-17 Today's Date: 02/14/2017    History of Present Illness 56 yo admitted for Left TKA. PMHx: arthritis, hep C, HTN    PT Comments    Pt performed increased gait and reviewed supine exercises from HEP.  Pt in increased pain but motivated to do his best during session.  Plan for d/c home this am as patient is ready from a mobility standpoint informed RN of patient's request for pain medicine and d/c paperwork.     Follow Up Recommendations  Home health PT     Equipment Recommendations  Rolling walker with 5" wheels;3in1 (PT)    Recommendations for Other Services       Precautions / Restrictions Precautions Precautions: Knee Precaution Comments: Reviewed weight bearing status Restrictions Weight Bearing Restrictions: Yes LLE Weight Bearing: Weight bearing as tolerated    Mobility  Bed Mobility               General bed mobility comments: Pt in recliner on arrival.    Transfers Overall transfer level: Needs assistance Equipment used: Rolling walker (2 wheeled) Transfers: Sit to/from Stand Sit to Stand: Min guard         General transfer comment: Cues for hand placement to and from seated surface.  Cues to keep RW close when backing to seated surface to return to seated position.  Pt observed moving RW to the side and stepping to chair without AD.  Pt edcuated on safety.    Ambulation/Gait Ambulation/Gait assistance: Min guard Ambulation Distance (Feet): 250 Feet Assistive device: Rolling walker (2 wheeled) Gait Pattern/deviations: Step-to pattern;Step-through pattern;Shuffle;Trunk flexed;Decreased stride length;Decreased stance time - left;Decreased step length - right   Gait velocity interpretation: Below normal speed for age/gender General Gait Details: Pt able to advance to step through pattern during session.  Pt required cues for increasing R stride length and  shifting weight to L foot.  Pt required cues throughout for gait symmetry with 30% compliance.  Pattern remains uneven but patient is consistently stepping through.     Stairs Stairs:  (Pt reports he feels confident is stair training from previous session.  )          Wheelchair Mobility    Modified Rankin (Stroke Patients Only)       Balance Overall balance assessment: No apparent balance deficits (not formally assessed)                                          Cognition Arousal/Alertness: Awake/alert Behavior During Therapy: WFL for tasks assessed/performed Overall Cognitive Status: Within Functional Limits for tasks assessed                                        Exercises Total Joint Exercises Ankle Circles/Pumps: AROM;Left;Supine;5 reps Quad Sets: AROM;Left;10 reps;Supine Towel Squeeze: AROM;Both;10 reps;Supine Short Arc Quad: AROM;Left;10 reps;Supine Heel Slides: AROM;Left;10 reps;Supine Hip ABduction/ADduction: AROM;Left;10 reps;Supine Straight Leg Raises: AROM;Left;10 reps;Supine Goniometric ROM: 91 degrees flexion in L knee.      General Comments        Pertinent Vitals/Pain Pain Assessment: 0-10 Pain Score: 8  Pain Location: left knee Pain Descriptors / Indicators: Aching;Constant;Sore Pain Intervention(s): Monitored during session;Repositioned;Ice applied    Home Living  Prior Function            PT Goals (current goals can now be found in the care plan section) Acute Rehab PT Goals Patient Stated Goal: To decrease pain Potential to Achieve Goals: Good Progress towards PT goals: Progressing toward goals    Frequency           PT Plan Current plan remains appropriate    Co-evaluation              AM-PAC PT "6 Clicks" Daily Activity  Outcome Measure  Difficulty turning over in bed (including adjusting bedclothes, sheets and blankets)?: A Little Difficulty moving  from lying on back to sitting on the side of the bed? : A Little Difficulty sitting down on and standing up from a chair with arms (e.g., wheelchair, bedside commode, etc,.)?: A Little Help needed moving to and from a bed to chair (including a wheelchair)?: A Little Help needed walking in hospital room?: A Little Help needed climbing 3-5 steps with a railing? : A Little 6 Click Score: 18    End of Session Equipment Utilized During Treatment: Gait belt Activity Tolerance: Patient tolerated treatment well Patient left: in chair;with call bell/phone within reach Nurse Communication: Mobility status;Precautions (informed RN patient wanting pain medicine.  ) PT Visit Diagnosis: Difficulty in walking, not elsewhere classified (R26.2);Muscle weakness (generalized) (M62.81);Pain Pain - Right/Left: Left Pain - part of body: Knee     Time: 6962-9528 PT Time Calculation (min) (ACUTE ONLY): 15 min  Charges:  $Gait Training: 8-22 mins                    G Codes:       Joycelyn Rua, PTA pager [phone]    Florestine Avers 02/14/2017, 10:33 AM

## 2017-02-14 NOTE — Progress Notes (Signed)
Pt ready for discharge. Education/instructions reviewed with pt, and all questions/concerns addressed. IV removed, prescriptions given, and belongings gathered. Pt will be transported out via wheelchair to friend's vehicle. Will continue to monitor 

## 2017-02-14 NOTE — Progress Notes (Signed)
   Subjective:  Patient reports pain as moderate.  Better than yesterday.  Objective:   VITALS:   Vitals:   02/13/17 0922 02/13/17 1051 02/13/17 1345 02/13/17 1933  BP: 136/74 136/74 (!) 164/89 134/66  Pulse: 65  90 76  Resp: Temp: 97.9 F (36.6 C)  97.9 F (36.6 C) 98.4 F (36.9 C)  TempSrc: Oral  Oral Oral  SpO2: 96%  94% 96%  Weight:      Height:        Neurologically intact Neurovascular intact Sensation intact distally Intact pulses distally Dorsiflexion/Plantar flexion intact Incision: dressing C/D/I and no drainage No cellulitis present Compartment soft   Lab Results  Component Value Date   WBC 12.1 (H) 02/13/2017   HGB 13.0 02/13/2017   HCT 39.0 02/13/2017   MCV 90.1 02/13/2017   PLT 278 02/13/2017     Assessment/Plan:  2 Days Post-Op   - Expected postop acute blood loss anemia - will monitor for symptoms - Up with PT/OT - DVT ppx - SCDs, ambulation, aspirin - WBAT operative extremity - Pain control - Discharge planning - home today after PT session this morning  Michael [name] 02/14/2017, 7:16 AM [phone]

## 2017-02-14 NOTE — Discharge Summary (Signed)
Physician Discharge Summary      Patient ID: Michael [name] MRN: [id number] DOB/AGE: 55-27-1962 55 y.o.  Admit date: 02/12/2017 Discharge date: 02/14/2017  Admission Diagnoses:  <principal problem not specified>  Discharge Diagnoses:  Active Problems:   Total knee replacement status   Past Medical History:  Diagnosis Date  . Arthritis   . Hepatitis    Hep C rx  . Hypertension     Surgeries: Procedure(s): LEFT TOTAL KNEE ARTHROPLASTY on 02/12/2017   Consultants (if any):   Discharged Condition: Improved  Hospital Course: Michael [name] is an 56 y.o. male who was admitted 02/12/2017 with a diagnosis of <principal problem not specified> and went to the operating room on 02/12/2017 and underwent the above named procedures.    He was given perioperative antibiotics:  Anti-infectives    Start     Dose/Rate Route Frequency Ordered Stop   02/12/17 2000  ceFAZolin (ANCEF) IVPB 2g/100 mL premix     2 g 200 mL/hr over 30 Minutes Intravenous Every 8 hours 02/12/17 1716 02/13/17 1352   02/12/17 0945  ceFAZolin (ANCEF) 2-4 GM/100ML-% IVPB    Comments:  Ray Church   : cabinet override      02/12/17 0945 02/12/17 1217   02/12/17 0944  ceFAZolin (ANCEF) IVPB 2g/100 mL premix     2 g 200 mL/hr over 30 Minutes Intravenous On call to O.R. 02/12/17 0865 02/12/17 1217    .  He was given sequential compression devices, early ambulation, and aspirin for DVT prophylaxis.  He benefited maximally from the hospital stay and there were no complications.    Recent vital signs:  Vitals:   02/13/17 1345 02/13/17 1933  BP: (!) 164/89 134/66  Pulse: 90 76  Resp: 18 19  Temp: 97.9 F (36.6 C) 98.4 F (36.9 C)  SpO2: 94% 96%    Recent laboratory studies:  Lab Results  Component Value Date   HGB 13.0 02/13/2017   HGB 13.7 02/12/2017   HGB 13.5 02/05/2017   Lab Results  Component Value Date   WBC 12.1 (H) 02/13/2017   PLT 278 02/13/2017   Lab Results  Component Value  Date   INR 0.86 02/12/2017   Lab Results  Component Value Date   NA 135 02/13/2017   K 4.3 02/13/2017   CL 100 (L) 02/13/2017   CO2 27 02/13/2017   BUN 16 02/13/2017   CREATININE 0.79 02/13/2017   GLUCOSE 118 (H) 02/13/2017    Discharge Medications:   Allergies as of 02/14/2017      Reactions   Chantix [varenicline] Itching, Rash      Medication List    TAKE these medications   amLODipine 5 MG tablet Commonly known as:  NORVASC Take 1 tablet (5 mg total) by mouth daily.   aspirin EC 325 MG tablet Take 1 tablet (325 mg total) by mouth 2 (two) times daily.   atorvastatin 10 MG tablet Commonly known as:  LIPITOR Take 1 tablet (10 mg total) by mouth daily.   CLEAR EYES OP Place 1 drop into both eyes every 4 (four) hours as needed (dry eyes).   diclofenac sodium 1 % Gel Commonly known as:  VOLTAREN Apply 2 g topically 4 (four) times daily.   lisinopril-hydrochlorothiazide 20-25 MG tablet Commonly known as:  PRINZIDE,ZESTORETIC Take 1 tablet by mouth every evening. What changed:  when to take this   methadone 10 MG tablet Commonly known as:  DOLOPHINE Take 70 mg by mouth daily.  methocarbamol 750 MG tablet Commonly known as:  ROBAXIN Take 1 tablet (750 mg total) by mouth 2 (two) times daily as needed for muscle spasms.   nicotine 21 mg/24hr patch Commonly known as:  NICODERM CQ - dosed in mg/24 hours Place 1 patch (21 mg total) onto the skin daily.   nicotine 14 mg/24hr patch Commonly known as:  NICODERM CQ - dosed in mg/24 hours Place 1 patch (14 mg total) onto the skin daily.   nicotine 7 mg/24hr patch Commonly known as:  NICODERM CQ - dosed in mg/24 hr Place 1 patch (7 mg total) onto the skin daily.   ondansetron 4 MG tablet Commonly known as:  ZOFRAN Take 1-2 tablets (4-8 mg total) by mouth every 8 (eight) hours as needed for nausea or vomiting.   oxyCODONE 5 MG immediate release tablet Commonly known as:  Oxy IR/ROXICODONE Take 1-3 tablets (5-15  mg total) by mouth 3 (three) times daily as needed.   promethazine 25 MG tablet Commonly known as:  PHENERGAN Take 1 tablet (25 mg total) by mouth every 6 (six) hours as needed for nausea.   senna-docusate 8.6-50 MG tablet Commonly known as:  SENOKOT S Take 1 tablet by mouth at bedtime as needed.   traMADol 50 MG tablet Commonly known as:  ULTRAM Take 1-2 tablets (50-100 mg total) by mouth 3 (three) times daily as needed. What changed:  how much to take  when to take this            Durable Medical Equipment        Start     Ordered   02/12/17 1717  DME Walker rolling  Once    Question:  Patient needs a walker to treat with the following condition  Answer:  Total knee replacement status   02/12/17 1716   02/12/17 1717  DME 3 n 1  Once     02/12/17 1716   02/12/17 1717  DME Bedside commode  Once    Question:  Patient needs a bedside commode to treat with the following condition  Answer:  Total knee replacement status   02/12/17 1716       Discharge Care Instructions        Start     Ordered   02/14/17 0000  Call MD / Call 911    Comments:  If you experience chest pain or shortness of breath, CALL 911 and be transported to the hospital emergency room.  If you develope a fever above 101.5 F, pus (white drainage) or increased drainage or redness at the wound, or calf pain, call your surgeon's office.   02/14/17 0717   02/14/17 0000  Constipation Prevention    Comments:  Drink plenty of fluids.  Prune juice may be helpful.  You may use a stool softener, such as Colace (over the counter) 100 mg twice a day.  Use MiraLax (over the counter) for constipation as needed.   02/14/17 0717   02/14/17 0000  Increase activity slowly as tolerated     02/14/17 0717   02/14/17 0000  Driving restrictions    Comments:  No driving while taking narcotic pain meds.   02/14/17 0717   02/12/17 0000  aspirin EC 325 MG tablet  2 times daily     02/12/17 1426   02/12/17 0000   methocarbamol (ROBAXIN) 750 MG tablet  2 times daily PRN     02/12/17 1426   02/12/17 0000  ondansetron (ZOFRAN) 4 MG tablet  Every 8 hours PRN     02/12/17 1426   02/12/17 0000  oxyCODONE (OXY IR/ROXICODONE) 5 MG immediate release tablet  3 times daily PRN     02/12/17 1426   02/12/17 0000  promethazine (PHENERGAN) 25 MG tablet  Every 6 hours PRN     02/12/17 1426   02/12/17 0000  senna-docusate (SENOKOT S) 8.6-50 MG tablet  At bedtime PRN     02/12/17 1426   02/12/17 0000  traMADol (ULTRAM) 50 MG tablet  3 times daily PRN     02/12/17 1426      Diagnostic Studies: Dg Knee Left Port  Result Date: 02/12/2017 CLINICAL DATA:  Postop day 0 left total knee arthroplasty. EXAM: PORTABLE LEFT KNEE - 1-2 VIEW COMPARISON:  11/22/2016. FINDINGS: Left total knee arthroplasty with anatomic alignment. No acute complicating features. Expected gas within the joint space. IMPRESSION: Left total knee arthroplasty with anatomic alignment and no acute complicating features. Electronically Signed   By: Hulan Saas M.D.   On: 02/12/2017 15:08    Disposition: 01-Home or Self Care  Discharge Instructions    Call MD / Call 911    Complete by:  As directed    If you experience chest pain or shortness of breath, CALL 911 and be transported to the hospital emergency room.  If you develope a fever above 101.5 F, pus (white drainage) or increased drainage or redness at the wound, or calf pain, call your surgeon's office.   Constipation Prevention    Complete by:  As directed    Drink plenty of fluids.  Prune juice may be helpful.  You may use a stool softener, such as Colace (over the counter) 100 mg twice a day.  Use MiraLax (over the counter) for constipation as needed.   Driving restrictions    Complete by:  As directed    No driving while taking narcotic pain meds.   Increase activity slowly as tolerated    Complete by:  As directed       Follow-up Information    Tarry Kos, MD In 2 weeks.     Specialty:  Orthopedic Surgery Why:  For suture removal, For wound re-check Contact information: [address] [place] [address] [phone]            Signed: Glee Arvin 02/14/2017, 7:17 AM

## 2017-02-17 ENCOUNTER — Encounter (HOSPITAL_COMMUNITY): Payer: Self-pay | Admitting: Orthopaedic Surgery

## 2017-02-27 ENCOUNTER — Ambulatory Visit (INDEPENDENT_AMBULATORY_CARE_PROVIDER_SITE_OTHER): Payer: Self-pay | Admitting: Orthopaedic Surgery

## 2017-02-27 DIAGNOSIS — M1712 Unilateral primary osteoarthritis, left knee: Secondary | ICD-10-CM

## 2017-02-27 MED ORDER — OXYCODONE HCL 5 MG PO TABS: 5.0000 mg | ORAL_TABLET | Freq: Every day | ORAL | 0 refills | Status: DC | PRN

## 2017-02-27 NOTE — Progress Notes (Signed)
[name] is 2 weeks status post left total knee replacement. He is doing well overall. He is taking 1 oxycodone a day as needed with physical therapy. He has some pain at times overall he is very happy. His range of motion is excellent and progressing well. His incision is healed without any signs of action. I think that he is doing very well from my standpoint. Continue aspirin for DVT prophylaxis. Continue with outpatient physical therapy at this point. Prescription for oxycodone. Follow-up in 4 weeks with 3 view x-rays of the left knee.

## 2017-03-10 ENCOUNTER — Ambulatory Visit: Payer: Self-pay | Attending: Orthopaedic Surgery

## 2017-03-10 DIAGNOSIS — M25662 Stiffness of left knee, not elsewhere classified: Secondary | ICD-10-CM | POA: Insufficient documentation

## 2017-03-10 DIAGNOSIS — R262 Difficulty in walking, not elsewhere classified: Secondary | ICD-10-CM | POA: Insufficient documentation

## 2017-03-10 DIAGNOSIS — R600 Localized edema: Secondary | ICD-10-CM | POA: Insufficient documentation

## 2017-03-10 DIAGNOSIS — M6281 Muscle weakness (generalized): Secondary | ICD-10-CM | POA: Insufficient documentation

## 2017-03-10 DIAGNOSIS — M25562 Pain in left knee: Secondary | ICD-10-CM | POA: Insufficient documentation

## 2017-03-10 NOTE — Patient Instructions (Signed)
Instructed to remove tape if skin irritated and keep on next 3 days if no problems an d can get tape wet

## 2017-03-10 NOTE — Therapy (Signed)
[facility] Outpatient Rehabilitation [facility] [address] [place], [place], [address] Phone: [phone]   Fax:  [phone]  Physical Therapy Evaluation  Patient Details  Name: Michael [name] MRN: [id number] Date of Birth: 24-Apr-1961 Referring Provider: Gershon Mussel ,MD  Encounter Date: 03/10/2017      PT End of Session - 03/10/17 1014    Visit Number 1   Number of Visits 24   Date for PT Re-Evaluation 05/30/17   Authorization Type CAFA   PT Start Time 1015   PT Stop Time 1110   PT Time Calculation (min) 55 min   Activity Tolerance Patient tolerated treatment well;Patient limited by pain   Behavior During Therapy [facility] for tasks assessed/performed      Past Medical History:  Diagnosis Date  . Arthritis   . Hepatitis    Hep C rx  . Hypertension     Past Surgical History:  Procedure Laterality Date  . left knee arthroscopy     . left rotator cuff surgery     . SHOULDER OPEN ROTATOR CUFF REPAIR Right 01/29/2013   Procedure: RIGHT SHOULDER MINI ROTATOR CUFF REPAIR;  Surgeon: Javier Docker, MD;  Location: [facility];  Service: Orthopedics;  Laterality: Right;  With ANCHORS  . TOTAL KNEE ARTHROPLASTY Left 02/12/2017  . TOTAL KNEE ARTHROPLASTY Left 02/12/2017   Procedure: LEFT TOTAL KNEE ARTHROPLASTY;  Surgeon: Tarry Kos, MD;  Location: [facility] OR;  Service: Orthopedics;  Laterality: Left;    There were no vitals filed for this visit.       Subjective Assessment - 03/10/17 1024    Subjective Pt reports chronic swelling due to ligament damage and joint bone on bone. He reports football injury in past. Was running for fitness a few years ago. reports stiffness.     Pertinent History  Doing HEP, Doing yard work   Limitations Walking  Not working.      Currently in Pain? Yes            [facility] PT Assessment - 03/10/17 0001      Assessment   Medical Diagnosis LT TKA    Referring Provider Gershon Mussel ,MD   Onset Date/Surgical Date 02/12/17   Prior  Therapy HHPt 6 visits     Precautions   Precautions None     Restrictions   Weight Bearing Restrictions No     Balance Screen   Has the patient fallen in the past 6 months No   Has the patient had a decrease in activity level because of a fear of falling?  No   Is the patient reluctant to leave their home because of a fear of falling?  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     Cognition   Overall Cognitive Status Within Functional Limits for tasks assessed     Observation/Other Assessments   Focus on Therapeutic Outcomes (FOTO)  42% limited     Observation/Other Assessments-Edema    Edema Circumferential     Circumferential Edema   Circumferential - Right 42 cm   Circumferential - Left  47 cm     ROM / Strength  AROM / PROM / Strength AROM;PROM;Strength     AROM   AROM Assessment Site Knee   Right/Left Knee Right;Left   Right Knee Extension 0   Right Knee Flexion 123   Left Knee Extension -20   Left Knee Flexion 105     PROM   PROM Assessment Site Knee   Right/Left Knee Left   Left Knee Extension -15   Left Knee Flexion 108     Strength   Overall Strength Comments Lt hip abduction 4/5  , extension 4+/5   Strength Assessment Site Knee   Right/Left Knee Right;Left   Right Knee Flexion 5/5   Right Knee Extension 5/5   Left Knee Flexion 5/5   Left Knee Extension 4/5     Flexibility   Soft Tissue Assessment /Muscle Length yes   Hamstrings WNL   ITB tight and pain LT      Palpation   Patella mobility some tightness with patella mobility     Ambulation/Gait   Assistive device Straight cane   Gait Pattern Step-through pattern;Within  Functional Limits   Ambulation Surface Level;Indoor            Objective measurements completed on examination: See above findings.          [facility] Adult PT Treatment/Exercise - 03/10/17 0001      Knee/Hip Exercises: Aerobic   Nustep L4 LE only 6 min     Manual Therapy   Manual Therapy Taping   Kinesiotex Edema     Kinesiotix   Edema 2 fans to Lt quad                PT Education - 03/10/17 1051    Education Details POC , tape management   Person(s) Educated Patient   Methods Explanation   Comprehension Verbalized understanding          PT Short Term Goals - 03/10/17 1058      PT SHORT TERM GOAL #1   Title He will be independent wint inital HEP   Time 4   Period Weeks   Status New     PT SHORT TERM GOAL #2   Title He will improve active flexion to 125 degrees    Time 4   Period Weeks   Status New     PT SHORT TERM GOAL #3   Title He will improve active LT knee extension to -10 degrees    Time 4   Period Weeks   Status New     PT SHORT TERM GOAL #4   Title He will walk full time without cane   Time 4   Period Weeks   Status New     PT SHORT TERM GOAL #5   Title Swelling decr LT knee to 44 cm comp to RT   Time 4   Period Weeks   Status New           PT Long Term Goals - 03/10/17 1059      PT LONG TERM GOAL #1   Title He will be independent with all hEP issued   Time 12   Period Weeks   Status New     PT LONG TERM GOAL #2   Title He will be able to walk up and doiwn stairs with one rail step ove step   Time 12   Period Weeks   Status New     PT LONG TERM GOAL #3   Title He will report being  ready to return to work   Time 12   Period Weeks   Status New     PT LONG TERM GOAL #4   Title He will be able to get on and off floor independently   Time 12   Period Weeks   Status New     PT LONG TERM GOAL #5   Title He will have 5/5 quad strenght to facilitate independence with wlaking /stairs and standing. tolrance for  workl   Time 12   Period Weeks   Status New                Plan - 03/10/17 1052    Clinical Impression Statement Mr Reamy report chronic knee issues needing TKA on LT . He reports good functional progress but with continued swellng pain and stiffness.  He also has some quad weakness due to pain and hip weakness    Clinical Presentation Stable   Clinical Decision Making Low   Rehab Potential Good   PT Frequency 2x / week   PT Duration 12 weeks   PT Treatment/Interventions Electrical Stimulation;Vasopneumatic Device;Patient/family education;Passive range of motion;Balance training;Therapeutic exercise;Taping;Therapeutic activities;Gait training;Stair training;Functional mobility training   PT Next Visit Plan Review HEP and manual /exercise for selling and strength.  Vaso   Consulted and Agree with Plan of Care Patient      Patient will benefit from skilled therapeutic intervention in order to improve the following deficits and impairments:  Pain, Difficulty walking, Decreased range of motion, Increased edema, Decreased strength, Decreased activity tolerance  Visit Diagnosis: Acute pain of left knee - Plan: PT plan of care cert/re-cert  Stiffness of left knee, not elsewhere classified - Plan: PT plan of care cert/re-cert  Localized edema - Plan: PT plan of care cert/re-cert  Muscle weakness (generalized) - Plan: PT plan of care cert/re-cert  Difficulty in walking, not elsewhere classified - Plan: PT plan of care cert/re-cert     Problem List Patient Active Problem List   Diagnosis Date Noted  . Total knee replacement status 02/12/2017  . Hepatitis C virus infection cured after antiviral drug therapy 01/19/2017  . Unilateral primary osteoarthritis, left knee 01/02/2017  . Hyperlipidemia 10/23/2016  . Essential hypertension 10/18/2016  . Tobacco dependence 10/18/2016  . 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 Right rotator cuff tear 01/29/2013     Caprice Red  PT 03/10/2017, 11:07 AM  [facility] [address] [place], [place], [address] Phone: [phone]   Fax:  [phone]  Name: Michael [name] MRN: [id number] Date of Birth: [birth year]

## 2017-03-14 MED FILL — AMLODIPINE BESYLATE 5 MG TA: 5 | 30 days supply | Qty: 30 | Fill #5

## 2017-03-17 ENCOUNTER — Ambulatory Visit: Payer: Self-pay

## 2017-03-18 MED FILL — ?ATORVASTATIN 10 MG TABLET: 10 | 30 days supply | Qty: 30 | Fill #4

## 2017-03-18 MED FILL — LISINOPRIL-HCTZ 20-25 MG TA: 20-25 | 30 days supply | Qty: 30 | Fill #5

## 2017-03-21 ENCOUNTER — Ambulatory Visit: Payer: Self-pay | Admitting: Physical Therapy

## 2017-03-24 ENCOUNTER — Ambulatory Visit: Payer: Self-pay

## 2017-03-24 DIAGNOSIS — M25662 Stiffness of left knee, not elsewhere classified: Secondary | ICD-10-CM

## 2017-03-24 DIAGNOSIS — M25562 Pain in left knee: Secondary | ICD-10-CM

## 2017-03-24 DIAGNOSIS — R262 Difficulty in walking, not elsewhere classified: Secondary | ICD-10-CM

## 2017-03-24 DIAGNOSIS — M6281 Muscle weakness (generalized): Secondary | ICD-10-CM

## 2017-03-24 DIAGNOSIS — R600 Localized edema: Secondary | ICD-10-CM

## 2017-03-24 NOTE — Therapy (Signed)
[facility] Outpatient Rehabilitation [facility] [address] [place], [place], [address] Phone: [phone]   Fax:  [phone]  Physical Therapy Treatment  Patient Details  Name: Michael [name] MRN: [id number] Date of Birth: November 10, 1960 Referring Provider: Gershon Mussel ,MD  Encounter Date: 03/24/2017      PT End of Session - 03/24/17 0948    Visit Number 2   Number of Visits 24   Date for PT Re-Evaluation 05/30/17   Authorization Type CAFA   PT Start Time 0945  15 min late   PT Stop Time 1035   PT Time Calculation (min) 50 min   Activity Tolerance Patient tolerated treatment well;Patient limited by pain   Behavior During Therapy [facility] for tasks assessed/performed      Past Medical History:  Diagnosis Date  . Arthritis   . Hepatitis    Hep C rx  . Hypertension     Past Surgical History:  Procedure Laterality Date  . left knee arthroscopy     . left rotator cuff surgery     . SHOULDER OPEN ROTATOR CUFF REPAIR Right 01/29/2013   Procedure: RIGHT SHOULDER MINI ROTATOR CUFF REPAIR;  Surgeon: Javier Docker, MD;  Location: [facility];  Service: Orthopedics;  Laterality: Right;  With ANCHORS  . TOTAL KNEE ARTHROPLASTY Left 02/12/2017  . TOTAL KNEE ARTHROPLASTY Left 02/12/2017   Procedure: LEFT TOTAL KNEE ARTHROPLASTY;  Surgeon: Tarry Kos, MD;  Location: [facility];  Service: Orthopedics;  Laterality: Left;    There were no vitals filed for this visit.      Subjective Assessment - 03/24/17 1015    Currently in Pain? Yes   Pain Score 6    Pain Location Knee   Pain Orientation Left   Pain Descriptors / Indicators Aching   Pain Type Surgical pain   Pain Onset More than a month ago   Pain Frequency Constant   Aggravating Factors  Bending , weight bearing.      Pain Relieving Factors elevation ,  meds    Multiple Pain Sites No            [facility] PT Assessment - 03/24/17 0001      Circumferential Edema   Circumferential - Left  48.5 cm     AROM   Left Knee Flexion 97  103 post stretching                     [facility] Adult PT Treatment/Exercise - 03/24/17 0001      Ambulation/Gait   Assistive device Straight cane     Knee/Hip Exercises: Standing   Heel Raises Both;15 reps   Forward Step Up Left;15 reps;Hand Hold: 2;Step Height: 8"     Knee/Hip Exercises: Seated   Long Arc Quad Left   Long Arc Quad Weight 5 lbs.   Long [employer] Limitations 25 reps 5 sec hold     Knee/Hip Exercises: Supine   Quad Sets Left;20 reps   Quad Sets Limitations 5 sec cues for maintaining contraction     Modalities   Modalities Vasopneumatic     Vasopneumatic   Number Minutes Vasopneumatic  15 minutes   Vasopnuematic Location  Knee   Vasopneumatic Pressure Medium   Vasopneumatic Temperature  35                  PT Short Term Goals - 03/10/17 1058      PT SHORT TERM GOAL #1   Title He will be  independent wint inital HEP   Time 4   Period Weeks   Status New     PT SHORT TERM GOAL #2   Title He will improve active flexion to 125 degrees    Time 4   Period Weeks   Status New     PT SHORT TERM GOAL #3   Title He will improve active LT knee extension to -10 degrees    Time 4   Period Weeks   Status New     PT SHORT TERM GOAL #4   Title He will walk full time without cane   Time 4   Period Weeks   Status New     PT SHORT TERM GOAL #5   Title Swelling decr LT knee to 44 cm comp to RT   Time 4   Period Weeks   Status New           PT Long Term Goals - 03/10/17 1059      PT LONG TERM GOAL #1   Title He will be independent with all hEP issued   Time 12   Period Weeks   Status New     PT LONG TERM GOAL #2   Title He will be able to walk up and doiwn stairs with one rail step ove step   Time 12   Period Weeks   Status New     PT LONG TERM GOAL #3   Title He will report being ready to return to work   Time 12   Period Weeks   Status New     PT LONG TERM GOAL #4   Title He will be able to  get on and off floor independently   Time 12   Period Weeks   Status New     PT LONG TERM GOAL #5   Title He will have 5/5 quad strenght to facilitate independence with wlaking /stairs and standing. tolrance for workl   Time 12   Period Weeks   Status New               Plan - 03/24/17 0949    Clinical Impression Statement Late today so limited session. Continue next session with ROM and strength. Appears increased activity incr edema and pain with iincreased stiffness,    PT Treatment/Interventions Electrical Stimulation;Vasopneumatic Device;Patient/family education;Passive range of motion;Balance training;Therapeutic exercise;Taping;Therapeutic activities;Gait training;Stair training;Functional mobility training   PT Next Visit Plan Review HEP and manual /exercise for swelling and strength.  Vaso   Consulted and Agree with Plan of Care Patient      Patient will benefit from skilled therapeutic intervention in order to improve the following deficits and impairments:  Pain, Difficulty walking, Decreased range of motion, Increased edema, Decreased strength, Decreased activity tolerance  Visit Diagnosis: Acute pain of left knee  Stiffness of left knee, not elsewhere classified  Localized edema  Muscle weakness (generalized)  Difficulty in walking, not elsewhere classified     Problem List Patient Active Problem List   Diagnosis Date Noted  . Total knee replacement status 02/12/2017  . Hepatitis C virus infection cured after antiviral drug therapy 01/19/2017  . Unilateral primary osteoarthritis, left knee 01/02/2017  . Hyperlipidemia 10/23/2016  . Essential hypertension 10/18/2016  . Tobacco dependence 10/18/2016  . 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 Right rotator cuff tear 01/29/2013    Caprice RedChasse, Bernabe Dorce M  PT 03/24/2017, 10:23 AM  [facility] [address]  [place], [place], [address] Phone:  [phone]   Fax:  [phone]  Name: Michael [name] MRN: [id number] Date of Birth: 21-Oct-1960

## 2017-03-28 ENCOUNTER — Ambulatory Visit: Payer: Self-pay | Attending: Orthopaedic Surgery | Admitting: Physical Therapy

## 2017-03-28 DIAGNOSIS — M25562 Pain in left knee: Secondary | ICD-10-CM | POA: Insufficient documentation

## 2017-03-28 DIAGNOSIS — M6281 Muscle weakness (generalized): Secondary | ICD-10-CM | POA: Insufficient documentation

## 2017-03-28 DIAGNOSIS — R262 Difficulty in walking, not elsewhere classified: Secondary | ICD-10-CM | POA: Insufficient documentation

## 2017-03-28 DIAGNOSIS — R600 Localized edema: Secondary | ICD-10-CM | POA: Insufficient documentation

## 2017-03-28 DIAGNOSIS — M25662 Stiffness of left knee, not elsewhere classified: Secondary | ICD-10-CM | POA: Insufficient documentation

## 2017-03-28 NOTE — Therapy (Signed)
[facility] Outpatient Rehabilitation [facility] [address] [place], [place], [address] Phone: [phone]   Fax:  [phone]  Physical Therapy Treatment  Patient Details  Name: Michael [name] MRN: [id number] Date of Birth: [birth year] Referring Provider: Gershon Mussel ,MD  Encounter Date: 03/28/2017      PT End of Session - 03/28/17 0933    Visit Number 3   Number of Visits 24   Date for PT Re-Evaluation 05/30/17   Authorization Type CAFA   PT Start Time 0931   PT Stop Time 1030   PT Time Calculation (min) 59 min      Past Medical History:  Diagnosis Date  . Arthritis   . Hepatitis    Hep C rx  . Hypertension     Past Surgical History:  Procedure Laterality Date  . left knee arthroscopy     . left rotator cuff surgery     . SHOULDER OPEN ROTATOR CUFF REPAIR Right 01/29/2013   Procedure: RIGHT SHOULDER MINI ROTATOR CUFF REPAIR;  Surgeon: Javier Docker, MD;  Location: [facility];  Service: Orthopedics;  Laterality: Right;  With ANCHORS  . TOTAL KNEE ARTHROPLASTY Left 02/12/2017  . TOTAL KNEE ARTHROPLASTY Left 02/12/2017   Procedure: LEFT TOTAL KNEE ARTHROPLASTY;  Surgeon: Tarry Kos, MD;  Location: [facility];  Service: Orthopedics;  Laterality: Left;    There were no vitals filed for this visit.      Subjective Assessment - 03/28/17 0933    Subjective Achey and throbbing today. Trying to elevate it more. I over did it last week.    Currently in Pain? Yes   Pain Score 3    Pain Location Knee   Pain Orientation Left   Pain Descriptors / Indicators Aching;Throbbing                         [facility] Adult PT Treatment/Exercise - 03/28/17 0001      Knee/Hip Exercises: Stretches   Active Hamstring Stretch 60 seconds   Active Hamstring Stretch Limitations foot in chair    Other Knee/Hip Stretches slant board, step, runners variations      Knee/Hip Exercises: Aerobic   Nustep L5 LE only 6 min     Knee/Hip Exercises: Standing   Knee  Flexion 15 reps   Knee Flexion Limitations painful    Lateral Step Up 15 reps;Hand Hold: 1;Step Height: 6"   Forward Step Up Left;15 reps;Hand Hold: 2;Step Height: 8"   Functional Squat 10 reps   Wall Squat 20 reps     Knee/Hip Exercises: Supine   Quad Sets Left;20 reps   Heel Slides 10 reps   Heel Slides Limitations increase HEP    Other Supine Knee/Hip Exercises hamstring curls red band painful      Vasopneumatic   Number Minutes Vasopneumatic  15 minutes   Vasopnuematic Location  Knee   Vasopneumatic Pressure Medium   Vasopneumatic Temperature  35     Kinesiotix   Edema 2 fans to Lt quad                  PT Short Term Goals - 03/10/17 1058      PT SHORT TERM GOAL #1   Title He will be independent wint inital HEP   Time 4   Period Weeks   Status New     PT SHORT TERM GOAL #2   Title He will improve active flexion to 125 degrees  Time 4   Period Weeks   Status New     PT SHORT TERM GOAL #3   Title He will improve active LT knee extension to -10 degrees    Time 4   Period Weeks   Status New     PT SHORT TERM GOAL #4   Title He will walk full time without cane   Time 4   Period Weeks   Status New     PT SHORT TERM GOAL #5   Title Swelling decr LT knee to 44 cm comp to RT   Time 4   Period Weeks   Status New           PT Long Term Goals - 03/10/17 1059      PT LONG TERM GOAL #1   Title He will be independent with all hEP issued   Time 12   Period Weeks   Status New     PT LONG TERM GOAL #2   Title He will be able to walk up and doiwn stairs with one rail step ove step   Time 12   Period Weeks   Status New     PT LONG TERM GOAL #3   Title He will report being ready to return to work   Time 12   Period Weeks   Status New     PT LONG TERM GOAL #4   Title He will be able to get on and off floor independently   Time 12   Period Weeks   Status New     PT LONG TERM GOAL #5   Title He will have 5/5 quad strenght to facilitate  independence with wlaking /stairs and standing. tolrance for workl   Time 12   Period Weeks   Status New               Plan - 03/28/17 1012    Clinical Impression Statement Pain in lateral hamstrings limiting flexion tolerance. Heel slides most comfortable. Repeated edema tape and vaso for edema management.    PT Next Visit Plan Review HEP and manual /exercise for swelling and strength.  Vaso, manual to hamstring    Consulted and Agree with Plan of Care Patient      Patient will benefit from skilled therapeutic intervention in order to improve the following deficits and impairments:  Pain, Difficulty walking, Decreased range of motion, Increased edema, Decreased strength, Decreased activity tolerance  Visit Diagnosis: Acute pain of left knee  Stiffness of left knee, not elsewhere classified  Localized edema  Difficulty in walking, not elsewhere classified     Problem List Patient Active Problem List   Diagnosis Date Noted  . Total knee replacement status 02/12/2017  . Hepatitis C virus infection cured after antiviral drug therapy 01/19/2017  . Unilateral primary osteoarthritis, left knee 01/02/2017  . Hyperlipidemia 10/23/2016  . Essential hypertension 10/18/2016  . Tobacco dependence 10/18/2016  . 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 Right rotator cuff tear 01/29/2013    Sherrie Mustacheonoho, Asiya Cutbirth McGee, PTA 03/28/2017, 10:14 AM  [facility] [address] [place], [place], [address] Phone: [phone]   Fax:  [phone]  Name: Michael [name] MRN: [id number] Date of Birth: [birth year]

## 2017-03-31 ENCOUNTER — Ambulatory Visit (INDEPENDENT_AMBULATORY_CARE_PROVIDER_SITE_OTHER): Payer: Self-pay | Admitting: Orthopaedic Surgery

## 2017-03-31 ENCOUNTER — Ambulatory Visit: Payer: Self-pay | Attending: Orthopaedic Surgery

## 2017-03-31 ENCOUNTER — Encounter (INDEPENDENT_AMBULATORY_CARE_PROVIDER_SITE_OTHER): Payer: Self-pay | Admitting: Orthopaedic Surgery

## 2017-03-31 ENCOUNTER — Ambulatory Visit (INDEPENDENT_AMBULATORY_CARE_PROVIDER_SITE_OTHER): Payer: Self-pay

## 2017-03-31 DIAGNOSIS — R600 Localized edema: Secondary | ICD-10-CM | POA: Insufficient documentation

## 2017-03-31 DIAGNOSIS — R262 Difficulty in walking, not elsewhere classified: Secondary | ICD-10-CM | POA: Insufficient documentation

## 2017-03-31 DIAGNOSIS — M1712 Unilateral primary osteoarthritis, left knee: Secondary | ICD-10-CM

## 2017-03-31 DIAGNOSIS — M6281 Muscle weakness (generalized): Secondary | ICD-10-CM | POA: Insufficient documentation

## 2017-03-31 DIAGNOSIS — M25562 Pain in left knee: Secondary | ICD-10-CM | POA: Insufficient documentation

## 2017-03-31 DIAGNOSIS — M25662 Stiffness of left knee, not elsewhere classified: Secondary | ICD-10-CM | POA: Insufficient documentation

## 2017-03-31 NOTE — Patient Instructions (Signed)
Prone leg hang off bed 5 min 2x/day

## 2017-03-31 NOTE — Progress Notes (Signed)
Patient is 7 weeks status post left uncemented total knee replacement.  He is doing well overall and doing physical therapy.  He has been increasing his activity and he gets some resultant swelling from this overall he is happy with his progress and improvement.  His surgical scar is fully healed.  Range of motion is 5 degrees to 110 degrees.  He does have some mild swelling today.  His x-rays show stable alignment of the total knee replacement.  From my standpoint he is doing well and improving appropriately.  Continue with physical therapy for range of motion and strengthening.  Follow-up in 2 months for recheck.  Needs 2 view x-rays of the left knee on return.

## 2017-03-31 NOTE — Therapy (Signed)
[facility] [address] [place], [place], [address] Phone: [phone]   Fax:  [phone]  Physical Therapy Treatment  Patient Details  Name: Michael [name] MRN: [id number] Date of Birth: 1960/07/24 Referring Provider: Gershon Mussel ,MD   Encounter Date: 03/31/2017  PT End of Session - 03/31/17 1036    Visit Number  4    Number of Visits  24    Date for PT Re-Evaluation  05/30/17    Authorization Type  CAFA    PT Start Time  0950    PT Stop Time  1045    PT Time Calculation (min)  55 min    Activity Tolerance  Patient tolerated treatment well;Patient limited by pain    Behavior During Therapy  [facility] for tasks assessed/performed       Past Medical History:  Diagnosis Date  . Arthritis   . Hepatitis    Hep C rx  . Hypertension     Past Surgical History:  Procedure Laterality Date  . left knee arthroscopy     . left rotator cuff surgery     . TOTAL KNEE ARTHROPLASTY Left 02/12/2017    There were no vitals filed for this visit.  Subjective Assessment - 03/31/17 0956    Subjective  Surgeon reports everything looks good.    Popping earlier with some swelling post.   MD said its OK    Currently in Pain?  No/denies                      [facility] Adult PT Treatment/Exercise - 03/31/17 0001      Knee/Hip Exercises: Aerobic   Recumbent Bike  Li 7 min ROM eased after 2 min      Knee/Hip Exercises: Standing   Forward Step Up  Left;20 reps;Hand Hold: 1;Step Height: 8"    Wall Squat  20 reps;10 seconds      Knee/Hip Exercises: Seated   Long Arc Quad  Left    Long Arc Quad Weight  7 lbs.    Long [employer] Limitations  25 reps 5 sec hold      Knee/Hip Exercises: Prone   Hamstring Curl  20 reps 5 pounds   5 pounds   Prone Knee Hang  4 minutes      Vasopneumatic   Number Minutes Vasopneumatic   15 minutes    Vasopnuematic Location   Knee    Vasopneumatic Pressure  Medium    Vasopneumatic Temperature   35       Manual Therapy   Manual Therapy  Passive ROM;Joint mobilization;Soft tissue mobilization    Joint Mobilization  AP gr4 glides     Passive ROM  flexion stretching x 6 60 sec each             PT Education - 03/31/17 1017    Education provided  Yes    Education Details  prone hange for extension    Person(s) Educated  Patient    Methods  Explanation;Tactile cues;Verbal cues;Handout    Comprehension  Returned demonstration;Verbalized understanding       PT Short Term Goals - 03/10/17 1058      PT SHORT TERM GOAL #1   Title  He will be independent wint inital HEP    Time  4    Period  Weeks    Status  New      PT SHORT TERM GOAL #2   Title  He will improve  active flexion to 125 degrees     Time  4    Period  Weeks    Status  New      PT SHORT TERM GOAL #3   Title  He will improve active LT knee extension to -10 degrees     Time  4    Period  Weeks    Status  New      PT SHORT TERM GOAL #4   Title  He will walk full time without cane    Time  4    Period  Weeks    Status  New      PT SHORT TERM GOAL #5   Title  Swelling decr LT knee to 44 cm comp to RT    Time  4    Period  Weeks    Status  New        PT Long Term Goals - 03/10/17 1059      PT LONG TERM GOAL #1   Title  He will be independent with all hEP issued    Time  12    Period  Weeks    Status  New      PT LONG TERM GOAL #2   Title  He will be able to walk up and doiwn stairs with one rail step ove step    Time  12    Period  Weeks    Status  New      PT LONG TERM GOAL #3   Title  He will report being ready to return to work    Time  12    Period  Weeks    Status  New      PT LONG TERM GOAL #4   Title  He will be able to get on and off floor independently    Time  12    Period  Weeks    Status  New      PT LONG TERM GOAL #5   Title  He will have 5/5 quad strenght to facilitate independence with wlaking /stairs and standing. tolrance for workl    Time  12    Period  Weeks     Status  New            Plan - 03/31/17 1037    Clinical Impression Statement  Doing well but with continueds pain with ROM . Walking without device. Pain less.   Progressing WNL .      PT Treatment/Interventions  Civil engineer, contractinglectrical Stimulation;Vasopneumatic Device;Patient/family education;Passive range of motion;Balance training;Therapeutic exercise;Taping;Therapeutic activities;Gait training;Stair training;Functional mobility training    PT Next Visit Plan  manual /exercise for swelling/ROM and strength.  Vaso, manual to hamstring    GOAL assessment    PT Home Exercise Plan  prone knee hang    Consulted and Agree with Plan of Care  Patient       Patient will benefit from skilled therapeutic intervention in order to improve the following deficits and impairments:  Pain, Difficulty walking, Decreased range of motion, Increased edema, Decreased strength, Decreased activity tolerance  Visit Diagnosis: Acute pain of left knee  Stiffness of left knee, not elsewhere classified  Localized edema  Difficulty in walking, not elsewhere classified  Muscle weakness (generalized)     Problem List Patient Active Problem List   Diagnosis Date Noted  . Total knee replacement status 02/12/2017  . Hepatitis C virus infection cured after antiviral drug therapy 01/19/2017  . Unilateral primary osteoarthritis,  left knee 01/02/2017  . Hyperlipidemia 10/23/2016  . Essential hypertension 10/18/2016  . Tobacco dependence 10/18/2016  . 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 Right rotator cuff tear 01/29/2013    Caprice Red  PT 03/31/2017, 10:40 AM  [facility] [address] [place], [place], [address] Phone: [phone]   Fax:  [phone]  Name: Michael [name] MRN: [id number] Date of Birth: [birth year]

## 2017-04-04 ENCOUNTER — Ambulatory Visit: Payer: Self-pay | Admitting: Physical Therapy

## 2017-04-04 ENCOUNTER — Encounter: Payer: Self-pay | Admitting: Physical Therapy

## 2017-04-04 DIAGNOSIS — R600 Localized edema: Secondary | ICD-10-CM

## 2017-04-04 DIAGNOSIS — M25662 Stiffness of left knee, not elsewhere classified: Secondary | ICD-10-CM

## 2017-04-04 DIAGNOSIS — M25562 Pain in left knee: Secondary | ICD-10-CM

## 2017-04-04 DIAGNOSIS — M6281 Muscle weakness (generalized): Secondary | ICD-10-CM

## 2017-04-04 DIAGNOSIS — R262 Difficulty in walking, not elsewhere classified: Secondary | ICD-10-CM

## 2017-04-04 NOTE — Therapy (Signed)
[facility] [address] [place], [place], [address] Phone: [phone]   Fax:  [phone]  Physical Therapy Treatment  Patient Details  Name: Michael [name] MRN: [id number] Date of Birth: 02/24/1961 Referring Provider: Gershon Mussel ,MD   Encounter Date: 04/04/2017  PT End of Session - 04/04/17 0936    Visit Number  5    Number of Visits  24    Date for PT Re-Evaluation  05/30/17    Authorization Type  CAFA    PT Start Time  0930    PT Stop Time  1012    PT Time Calculation (min)  42 min       Past Medical History:  Diagnosis Date  . Arthritis   . Hepatitis    Hep C rx  . Hypertension     Past Surgical History:  Procedure Laterality Date  . left knee arthroscopy     . left rotator cuff surgery     . TOTAL KNEE ARTHROPLASTY Left 02/12/2017    There were no vitals filed for this visit.  Subjective Assessment - 04/04/17 0935    Subjective  Swelling is  still there, still gets stiff.     Currently in Pain?  Yes    Pain Score  2     Pain Location  Knee    Pain Orientation  Left    Pain Descriptors / Indicators  -- stiffness    Aggravating Factors   sleeping    Pain Relieving Factors  elevation, meds          [facility] PT Assessment - 04/04/17 0001      AROM   Left Knee Extension  -- -10 after session    Left Knee Flexion  108                  [facility] Adult PT Treatment/Exercise - 04/04/17 0001      Knee/Hip Exercises: Stretches   Other Knee/Hip Stretches  slant board       Knee/Hip Exercises: Aerobic   Recumbent Bike  L2 z 7 minutes       Knee/Hip Exercises: Standing   Forward Step Up Limitations  15 step ups on BOSU    Other Standing Knee Exercises  Terminal knee extension into ball on wall      Knee/Hip Exercises: Supine   Quad Sets  Left;20 reps    Straight Leg Raises  15 reps initial quad set     Patellar Mobs  all planes     Other Supine Knee/Hip Exercises  terminal knee extension  with heel prop       Knee/Hip Exercises: Prone   Prone Knee Hang  4 minutes      Manual Therapy   Manual therapy comments  [place] to lateral  distal hamstring                PT Short Term Goals - 03/10/17 1058      PT SHORT TERM GOAL #1   Title  He will be independent wint inital HEP    Time  4    Period  Weeks    Status  New      PT SHORT TERM GOAL #2   Title  He will improve active flexion to 125 degrees     Time  4    Period  Weeks    Status  New      PT SHORT TERM GOAL #3  Title  He will improve active LT knee extension to -10 degrees     Time  4    Period  Weeks    Status  New      PT SHORT TERM GOAL #4   Title  He will walk full time without cane    Time  4    Period  Weeks    Status  New      PT SHORT TERM GOAL #5   Title  Swelling decr LT knee to 44 cm comp to RT    Time  4    Period  Weeks    Status  New        PT Long Term Goals - 03/10/17 1059      PT LONG TERM GOAL #1   Title  He will be independent with all hEP issued    Time  12    Period  Weeks    Status  New      PT LONG TERM GOAL #2   Title  He will be able to walk up and doiwn stairs with one rail step ove step    Time  12    Period  Weeks    Status  New      PT LONG TERM GOAL #3   Title  He will report being ready to return to work    Time  12    Period  Weeks    Status  New      PT LONG TERM GOAL #4   Title  He will be able to get on and off floor independently    Time  12    Period  Weeks    Status  New      PT LONG TERM GOAL #5   Title  He will have 5/5 quad strenght to facilitate independence with wlaking /stairs and standing. tolrance for workl    Time  12    Period  Weeks    Status  New            Plan - 04/04/17 1018    Clinical Impression Statement  Pt arrives with flexion ROM at 108 however extension lacking around 20 degrees. By the end of session knee extension imporoved to -10. Patella mobs, kne extension exercises and stretching followed  by IASTM to hamsting were helpful.     PT Next Visit Plan  manual /exercise for swelling/ROM and strength.  Vaso, manual to hamstring    GOAL assessment    PT Home Exercise Plan  prone knee hang    Consulted and Agree with Plan of Care  Patient       Patient will benefit from skilled therapeutic intervention in order to improve the following deficits and impairments:  Pain, Difficulty walking, Decreased range of motion, Increased edema, Decreased strength, Decreased activity tolerance  Visit Diagnosis: Acute pain of left knee  Stiffness of left knee, not elsewhere classified  Localized edema  Difficulty in walking, not elsewhere classified  Muscle weakness (generalized)     Problem List Patient Active Problem List   Diagnosis Date Noted  . Total knee replacement status 02/12/2017  . Hepatitis C virus infection cured after antiviral drug therapy 01/19/2017  . Unilateral primary osteoarthritis, left knee 01/02/2017  . Hyperlipidemia 10/23/2016  . Essential hypertension 10/18/2016  . Tobacco dependence 10/18/2016  . 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 Right rotator cuff tear 01/29/2013    Talton Delpriore, Orpah CobbJessica McGee ,  PTA 04/04/2017, 10:31 AM  [facility] [address] [place], [place], [address] Phone: [phone]   Fax:  [phone]  Name: [name] D Kerper MRN: [id number] Date of Birth: August 01, 1960

## 2017-04-07 ENCOUNTER — Ambulatory Visit: Payer: Self-pay

## 2017-04-07 DIAGNOSIS — R600 Localized edema: Secondary | ICD-10-CM

## 2017-04-07 DIAGNOSIS — M25662 Stiffness of left knee, not elsewhere classified: Secondary | ICD-10-CM

## 2017-04-07 DIAGNOSIS — M25562 Pain in left knee: Secondary | ICD-10-CM

## 2017-04-07 DIAGNOSIS — M6281 Muscle weakness (generalized): Secondary | ICD-10-CM

## 2017-04-07 DIAGNOSIS — R262 Difficulty in walking, not elsewhere classified: Secondary | ICD-10-CM

## 2017-04-07 NOTE — Therapy (Addendum)
[place], [place], [address] Phone: [phone]   Fax:  [phone]  Physical Therapy Treatment/Discharge  Patient Details  Name: Michael [name] MRN: [id number] Date of Birth: March 17, 1961 Referring Provider: Frankey [name] ,MD   Encounter Date: 04/07/2017  PT End of Session - 04/07/17 0940    Visit Number  6    Number of Visits  24    Date for PT Re-Evaluation  05/30/17    Authorization Type  CAFA    PT Start Time  0930    PT Stop Time  1013    PT Time Calculation (min)  43 min    Activity Tolerance  Patient tolerated treatment well;Patient limited by pain    Behavior During Therapy  [facility] for tasks assessed/performed       Past Medical History:  Diagnosis Date  . Arthritis   . Hepatitis    Hep C rx  . Hypertension     Past Surgical History:  Procedure Laterality Date  . left knee arthroscopy     . left rotator cuff surgery     . TOTAL KNEE ARTHROPLASTY Left 02/12/2017    There were no vitals filed for this visit.  Subjective Assessment - 04/07/17 0940    Subjective  stiff but no pain , feels prety good    Currently in Pain?  No/denies         [facility] PT Assessment - 04/07/17 0001      AROM   Left Knee Extension  -- post knee 3/4 inch from table    Left Knee Flexion  113                  [facility] Adult PT Treatment/Exercise - 04/07/17 0001      Knee/Hip Exercises: Aerobic   Recumbent Bike  L2 z 7 minutes       Knee/Hip Exercises: Standing   Knee Flexion  15 reps;Left    Hip Abduction  Left;20 reps    Abduction Limitations  cue for quad set    Forward Step Up Limitations  20 step ups on BOSU, hand hold 1    Other Standing Knee Exercises  Terminal knee extension into ball on wall x25 reps 5 sec hold  and x 20 reps 5 sec stretching       Manual Therapy   Joint Mobilization  patella mobs gr4 all directions    Passive ROM  extension with gentle overpressure  able to et post  knee within  3/4 inch from mat.  then flexion stretching leg off table                PT Short Term Goals - 04/07/17 1025      PT SHORT TERM GOAL #1   Title  He will be independent wint inital HEP    Baseline  He is doing stretching on a regular basis and can show how he does this at home    Status  Achieved      PT SHORT TERM GOAL #2   Title  He will improve active flexion to 125 degrees     Baseline  113 improved    Status  On-going      PT SHORT TERM GOAL #3   Title  He will improve active LT knee extension to -10 degrees     Baseline  -20    Status  On-going      PT SHORT TERM GOAL #4  Title  He will walk full time without cane    Status  Achieved      PT SHORT TERM GOAL #5   Title  Swelling decr LT knee to 44 cm comp to RT    Status  Unable to assess        PT Long Term Goals - 03/10/17 1059      PT LONG TERM GOAL #1   Title  He will be independent with all hEP issued    Time  12    Period  Weeks    Status  New      PT LONG TERM GOAL #2   Title  He will be able to walk up and doiwn [name] with one rail step ove step    Time  12    Period  Weeks    Status  New      PT LONG TERM GOAL #3   Title  He will report being ready to return to work    Time  12    Period  Weeks    Status  New      PT LONG TERM GOAL #4   Title  He will be able to get on and off floor independently    Time  12    Period  Weeks    Status  New      PT LONG TERM GOAL #5   Title  He will have 5/5 quad strenght to facilitate independence with wlaking /[name] and standing. tolrance for workl    Time  12    Period  Weeks    Status  New            Plan - 04/07/17 0940    Clinical Impression Statement  Declined modalities. He is frustrated wiuth progress but admits he is better.  He is progressing with incr ROM  less pain.     PT Treatment/Interventions  Health visitor;Patient/family education;Passive range of motion;Balance  training;Therapeutic exercise;Taping;Therapeutic activities;Gait training;Stair training;Functional mobility training    PT Next Visit Plan  manual /exercise for swelling/ROM and strength.  Vaso, manual to hamstring    measure swelling    PT Home Exercise Plan  prone knee hang    Consulted and Agree with Plan of Care  Patient       Patient will benefit from skilled therapeutic intervention in order to improve the following deficits and impairments:  Pain, Difficulty walking, Decreased range of motion, Increased edema, Decreased strength, Decreased activity tolerance  Visit Diagnosis: Acute pain of left knee  Stiffness of left knee, not elsewhere classified  Localized edema  Difficulty in walking, not elsewhere classified  Muscle weakness (generalized)     Problem List Patient Active Problem List   Diagnosis Date Noted  . Total knee replacement status 02/12/2017  . Hepatitis C virus infection cured after antiviral drug therapy 01/19/2017  . Unilateral primary osteoarthritis, left knee 01/02/2017  . Hyperlipidemia 10/23/2016  . Essential hypertension 10/18/2016  . Tobacco dependence 10/18/2016  . Substance abuse in remission ([place]) 10/18/2016  . Right rotator cuff tear 01/29/2013    Michael [name]  PT 04/07/2017, 10:29 AM  [facility] [address] [place], [place], [address] Phone: [phone]   Fax:  [phone]  Name: Michael [name] MRN: [id number] Date of Birth: 1961-01-29 PHYSICAL THERAPY DISCHARGE SUMMARY  Visits from Start of Care: 6  Current functional level related to goals / functional outcomes: Unknown as he  did not return after this visit. He was called and message left for him to call if he wanted to continue and he did not.   Remaining deficits: Unknown .   Education / Equipment: HEP Plan: Patient agrees to discharge.  Patient goals were not met. Patient is being discharged due to not  returning since the last visit.  ?????    Michael [name] PT 05/15/17

## 2017-04-11 ENCOUNTER — Encounter: Payer: Self-pay | Admitting: Physical Therapy

## 2017-04-14 MED FILL — LISINOPRIL-HCTZ 20-25 MG TA: 20-25 | 30 days supply | Qty: 30 | Fill #6

## 2017-04-14 MED FILL — AMLODIPINE BESYLATE 5 MG TA: 5 | 30 days supply | Qty: 30 | Fill #6

## 2017-04-14 MED FILL — ?ATORVASTATIN 10 MG TABLET: 10 | 30 days supply | Qty: 30 | Fill #5

## 2017-04-25 ENCOUNTER — Telehealth: Payer: Self-pay | Admitting: Physical Therapy

## 2017-04-25 ENCOUNTER — Ambulatory Visit: Payer: Self-pay | Admitting: Physical Therapy

## 2017-04-25 NOTE — Telephone Encounter (Signed)
Left message regarding no show to appointment today. He has no future appointments scheduled so asked him to call if he wants more appointments.

## 2017-05-14 MED FILL — AMLODIPINE BESYLATE 5 MG TA: 5 | 30 days supply | Qty: 30 | Fill #7

## 2017-05-14 MED FILL — ?ATORVASTATIN 10 MG TABLET: 10 | 30 days supply | Qty: 30 | Fill #6

## 2017-05-14 MED FILL — LISINOPRIL-HCTZ 20-25 MG TA: 20-25 | 30 days supply | Qty: 30 | Fill #7

## 2017-05-16 ENCOUNTER — Telehealth (INDEPENDENT_AMBULATORY_CARE_PROVIDER_SITE_OTHER): Payer: Self-pay | Admitting: Orthopaedic Surgery

## 2017-05-16 NOTE — Telephone Encounter (Signed)
Patient needs a release note to go back to work. He would like it faxed if possible.  Fax # [phone] Attn: Rosalita [place]an[place] PAI

## 2017-05-16 NOTE — Telephone Encounter (Signed)
Note generated and faxed. Patient advised done.

## 2017-05-16 NOTE — Telephone Encounter (Signed)
error 

## 2017-05-16 NOTE — Telephone Encounter (Signed)
yes

## 2017-05-16 NOTE — Telephone Encounter (Signed)
Is he okay to go back to work?

## 2017-05-22 ENCOUNTER — Encounter: Payer: Self-pay | Admitting: Internal Medicine

## 2017-05-22 ENCOUNTER — Ambulatory Visit: Payer: Self-pay | Attending: Internal Medicine | Admitting: Licensed Clinical Social Worker

## 2017-05-22 ENCOUNTER — Ambulatory Visit: Payer: Self-pay | Attending: Internal Medicine | Admitting: Internal Medicine

## 2017-05-22 VITALS — BP 146/91 | HR 97 | Temp 98.0°F | Resp 16 | Wt 256.6 lb

## 2017-05-22 DIAGNOSIS — Z79899 Other long term (current) drug therapy: Secondary | ICD-10-CM | POA: Insufficient documentation

## 2017-05-22 DIAGNOSIS — F17210 Nicotine dependence, cigarettes, uncomplicated: Secondary | ICD-10-CM | POA: Insufficient documentation

## 2017-05-22 DIAGNOSIS — F329 Major depressive disorder, single episode, unspecified: Secondary | ICD-10-CM

## 2017-05-22 DIAGNOSIS — F1193 Opioid use, unspecified with withdrawal: Secondary | ICD-10-CM

## 2017-05-22 DIAGNOSIS — Z1331 Encounter for screening for depression: Secondary | ICD-10-CM

## 2017-05-22 DIAGNOSIS — F32A Depression, unspecified: Secondary | ICD-10-CM

## 2017-05-22 DIAGNOSIS — E785 Hyperlipidemia, unspecified: Secondary | ICD-10-CM

## 2017-05-22 DIAGNOSIS — I10 Essential (primary) hypertension: Secondary | ICD-10-CM

## 2017-05-22 DIAGNOSIS — F17200 Nicotine dependence, unspecified, uncomplicated: Secondary | ICD-10-CM

## 2017-05-22 DIAGNOSIS — F1123 Opioid dependence with withdrawal: Secondary | ICD-10-CM

## 2017-05-22 DIAGNOSIS — Z96652 Presence of left artificial knee joint: Secondary | ICD-10-CM | POA: Insufficient documentation

## 2017-05-22 DIAGNOSIS — Z8619 Personal history of other infectious and parasitic diseases: Secondary | ICD-10-CM | POA: Insufficient documentation

## 2017-05-22 DIAGNOSIS — Z8249 Family history of ischemic heart disease and other diseases of the circulatory system: Secondary | ICD-10-CM | POA: Insufficient documentation

## 2017-05-22 DIAGNOSIS — Z888 Allergy status to other drugs, medicaments and biological substances status: Secondary | ICD-10-CM | POA: Insufficient documentation

## 2017-05-22 DIAGNOSIS — F419 Anxiety disorder, unspecified: Secondary | ICD-10-CM

## 2017-05-22 DIAGNOSIS — Z7982 Long term (current) use of aspirin: Secondary | ICD-10-CM | POA: Insufficient documentation

## 2017-05-22 MED ORDER — AMLODIPINE BESYLATE 10 MG PO TABS: 10.0000 mg | ORAL_TABLET | Freq: Every day | ORAL | 3 refills | Status: DC

## 2017-05-22 MED ORDER — LISINOPRIL-HYDROCHLOROTHIAZIDE 20-25 MG PO TABS: 1.0000 | ORAL_TABLET | Freq: Every day | ORAL | 3 refills | Status: DC

## 2017-05-22 NOTE — Progress Notes (Signed)
Patient ID: Michael [name], male    DOB: [birth year]  MRN: [id number]  CC: Hypertension   Subjective: Michael [name] is a 56 y.o. male who presents for chronic ds. His concerns today include:  Patient is a 56 year old Caucasian male with history of hypertension, Hep C treated with interferon/rib combo in [place] 2005, tobacco abuse, substance use disorder (including cocaine) on Methadone for past 6 mths (states he is on it more so for pain in shoulder and knee), HL  1.  Substance Abuse: c/o having bad withdrawals from [facility] -Was on Methadone for about 6 mhts until recently.  had a friend who was purchasing it for him 3 times a wk while he was out of work. Friend died. He has been out of the med x 2 wks. He was on 80 mg 3 x a wk.  -having problems sleeping, hot and cold flashes, diarrhea, decrease energy. Was able to get some 10 mg tabs from a friend 4 days ago.  Wanting to know if there is anything I can do or recommend to help him get through this withdrawal -called the [facility] but he states they would not help unless he has money to pay for the medication.  He wants to get off of the medicine completely.  2. HTN: -compliant with Norvasc and lisinopril/HCTZ. Compliant with salt restriction. No chest pains or shortness of breath.  3. Tob abuse: He has cut back significantly.  Trying to quit.  4.  Since last visit he has had left TKR and is doing well.  Plans to see Dr. Roda Shutters to be release to work starting next wk  Patient Active Problem List   Diagnosis Date Noted  . Total knee replacement status 02/12/2017  . Hepatitis C virus infection cured after antiviral drug therapy 01/19/2017  . Unilateral primary osteoarthritis, left knee 01/02/2017  . Hyperlipidemia 10/23/2016  . Essential hypertension 10/18/2016  . Tobacco dependence 10/18/2016  . 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 Right rotator cuff tear 01/29/2013     Current Outpatient  Medications on File Prior to Visit  Medication Sig Dispense Refill  . aspirin EC 325 MG tablet Take 1 tablet (325 mg total) by mouth 2 (two) times daily. 84 tablet 0  . atorvastatin (LIPITOR) 10 MG tablet Take 1 tablet (10 mg total) by mouth daily. 90 tablet PRN  . diclofenac sodium (VOLTAREN) 1 % GEL Apply 2 g topically 4 (four) times daily. (Patient not taking: Reported on 03/31/2017) 100 g 2  . Naphazoline HCl (CLEAR EYES OP) Place 1 drop into both eyes every 4 (four) hours as needed (dry eyes).    . nicotine (NICODERM CQ - DOSED IN MG/24 HOURS) 14 mg/24hr patch Place 1 patch (14 mg total) onto the skin daily. 28 patch 0  . nicotine (NICODERM CQ - DOSED IN MG/24 HOURS) 21 mg/24hr patch Place 1 patch (21 mg total) onto the skin daily. 28 patch 0  . nicotine (NICODERM CQ - DOSED IN MG/24 HR) 7 mg/24hr patch Place 1 patch (7 mg total) onto the skin daily. 28 patch 0  . ondansetron (ZOFRAN) 4 MG tablet Take 1-2 tablets (4-8 mg total) by mouth every 8 (eight) hours as needed for nausea or vomiting. (Patient not taking: Reported on 03/31/2017) 40 tablet 0  . traMADol (ULTRAM) 50 MG tablet Take 1-2 tablets (50-100 mg total) by mouth 3 (three) times daily as needed. (Patient not taking: Reported on 03/31/2017) 60 tablet 3  No current facility-administered medications on file prior to visit.     Allergies  Allergen Reactions  . Chantix [Varenicline] Itching and Rash    Social History   Socioeconomic History  . Marital status: Single    Spouse name: Not on file  . Number of children: 0  . Years of education: 212  . Highest education level: Not on file  Social Needs  . Financial resource strain: Not on file  . Food insecurity - worry: Not on file  . Food insecurity - inability: Not on file  . Transportation needs - medical: Not on file  . Transportation needs - non-medical: Not on file  Occupational History  . Occupation: Chartered certified accountantmachinist  Tobacco Use  . Smoking status: Current Every Day Smoker     Packs/day: 0.50    Years: 37.00    Pack years: 18.50    Types: Cigarettes  . Smokeless tobacco: Never Used  Substance and Sexual Activity  . Alcohol use: Yes    Comment: occ  . Drug use: No    Comment: hx of marijuana and cocaine, LSD  . Sexual activity: Yes    Partners: Female    Birth control/protection: None  Other Topics Concern  . Not on file  Social History Narrative  . Not on file    Family History  Problem Relation Age of Onset  . CAD Mother   . Deep vein thrombosis Brother     Past Surgical History:  Procedure Laterality Date  . left knee arthroscopy     . left rotator cuff surgery     . SHOULDER OPEN ROTATOR CUFF REPAIR Right 01/29/2013   Procedure: RIGHT SHOULDER MINI ROTATOR CUFF REPAIR;  Surgeon: Javier DockerJeffrey C Beane, MD;  Location: [facility];  Service: Orthopedics;  Laterality: Right;  With ANCHORS  . TOTAL KNEE ARTHROPLASTY Left 02/12/2017  . TOTAL KNEE ARTHROPLASTY Left 02/12/2017   Procedure: LEFT TOTAL KNEE ARTHROPLASTY;  Surgeon: Tarry KosXu, Naiping M, MD;  Location: [facility];  Service: Orthopedics;  Laterality: Left;    ROS: Review of Systems Negative except as stated above PHYSICAL EXAM: BP (!) 146/91   Pulse 97   Temp 98 F (36.7 C) (Oral)   Resp 16   Wt 256 lb 9.6 oz (116.4 kg)   SpO2 95%   BMI 35.79 kg/m   Physical Exam General appearance - alert, well appearing, and in no distress Mental status -seems a bit hyper alert.  Does have some rhinorrhea Chest - clear to auscultation, no wheezes, rales or rhonchi, symmetric air entry Heart - normal rate, regular rhythm, normal S1, S2, no murmurs, rubs, clicks or gallops Extremities - peripheral pulses normal, no pedal edema, no clubbing or cyanosis  [facility] 05/22/2017 01/20/2017 10/18/2016  Decreased Interest 1 0 0  Down, Depressed, Hopeless 1 0 0  PHQ - 2 Score 2 0 0  Altered sleeping 2 1 0  Tired, decreased energy 2 1 0  Change in appetite 1 1 0  Feeling bad or failure about yourself  1 0 0   Trouble concentrating 1 0 0  Moving slowly or fidgety/restless 1 0 0  Suicidal thoughts 0 0 0  PHQ-9 Score 10 3 0     ASSESSMENT AND PLAN: 1. Methadone withdrawal (HCC) -We will have our LCSW meet with him today to discuss options.  If all fails I recommend that he be seen in the emergency room to request to be admitted for detox.  2. Essential hypertension Not  at goal. Increase Norvasc to 10 mg daily. - lisinopril-hydrochlorothiazide (PRINZIDE,ZESTORETIC) 20-25 MG tablet; Take 1 tablet by mouth daily.  Dispense: 90 tablet; Refill: 3 - amLODipine (NORVASC) 10 MG tablet; Take 1 tablet (10 mg total) by mouth daily.  Dispense: 90 tablet; Refill: 3  3. Tobacco dependence Commended him on his efforts to try to quit smoking.  Encouraged him to continue to work on cutting back.  4. Hyperlipidemia, unspecified hyperlipidemia type Continue atorvastatin.  5. Positive depression screening -seen by LCSW today. Declines med for depression  Patient was given the opportunity to ask questions.  Patient verbalized understanding of the plan and was able to repeat key elements of the plan.   No orders of the defined types were placed in this encounter.    Requested Prescriptions   Signed Prescriptions Disp Refills  . lisinopril-hydrochlorothiazide (PRINZIDE,ZESTORETIC) 20-25 MG tablet 90 tablet 3    Sig: Take 1 tablet by mouth daily.  [name]. amLODipine (NORVASC) 10 MG tablet 90 tablet 3    Sig: Take 1 tablet (10 mg total) by mouth daily.    Return in about 3 months (around 08/20/2017).  Jonah Blueeborah Maridee Slape, MD, FACP

## 2017-05-22 NOTE — Progress Notes (Signed)
Pt states he is trying to come off methadone and it is not going good Pt states he can't sleep and he is having bad withdrawals from it Pt states he is wanting to discuss other resources there is to try

## 2017-05-22 NOTE — BH Specialist Note (Signed)
Integrated Behavioral Health Initial Visit  MRN: [id number] Name: [name] D Ow  Number of Integrated Behavioral Health Clinician visits:: 1/6 Session Start time: 9:30 AM  Session End time: 10:00 AM Total time: 30 minutes  Type of Service: Integrated Behavioral Health- Individual/Family Interpretor:No. Interpretor Name and Language: N/A   Warm Hand Off Completed.       SUBJECTIVE: [name] D Kensinger is a 56 y.o. male accompanied by self Patient was referred by Dr. Laural BenesJohnson for depression and substance use. Patient reports the following symptoms/concerns: feelings of sadness and worry, decreased sleep, low energy, and current withdrawals from methadone Duration of problem: 2 weeks; Severity of problem: moderate  OBJECTIVE: Mood: Anxious and Affect: Appropriate Risk of harm to self or others: No plan to harm self or others  LIFE CONTEXT: Family and Social: Pt receives support from friends to assist with shelter and finances School/Work: Pt is expected to return to his FT job next week. He receives food stamps [phone]($[phone]) Self-Care: Pt has hx of substance use (cocaine) He was on methadone for approx eight months to assist with chronic pain; however, has been unable to afford medication since being on medical leave.  Life Changes: Pt had surgery on his knee in 01/2017 resulting in him being placed on medical leave. Pt is currently having methadone withdrawals due to inability to afford medication.  GOALS ADDRESSED: Patient will: 1. Reduce symptoms of: anxiety and depression 2. Increase knowledge and/or ability of: coping skills  3. Demonstrate ability to: Increase adequate support systems for patient/family  INTERVENTIONS: Interventions utilized: Solution-Focused Strategies, Supportive Counseling, Psychoeducation and/or Health Education and Link to [facility]  Standardized Assessments completed: GAD-7 and PHQ 2&9  ASSESSMENT: Patient currently experiencing depression and  anxiety. Pt had surgery on his knee in 01/2017 resulting in him being placed on medical leave. Pt is currently having methadone withdrawals due to inability to afford medication. He reports feelings of sadness and worry, decreased sleep, and low energy. He receives emotional and financial support from friends.   Patient may benefit from psychoeducation, psychotherapy, and medication management. LCSWA educated pt on correlation between one's physical and mental health. Pt had good insight on how substance use can negatively impact his health and was successful in identifying healthy strategies to cope with stressors. He plans to contact [facility] at [facility] to inquire about financial assistance. LCSWA provided additional community resources regarding methadone treatment and crisis intervention to assist with detox. Pt is not interested in medication management.   PLAN: 1. Follow up with behavioral health clinician on : Pt was encouraged to contact LCSWA if symptoms worsen or fail to improve to [facility]. 2. Behavioral recommendations: LCSWA recommends that pt apply healthy coping skills discussed and utilize provided resources. Pt is encouraged to schedule follow up appointment with LCSWA 3. Referral(s): Substance Abuse Program 4. "From scale of 1-10, how likely are you to follow plan?": 10/10  Bridgett LarssonJasmine D Lewis, LCSW 05/23/17 9:25 AM

## 2017-05-22 NOTE — Patient Instructions (Signed)
Your blood pressure is not well controlled.  Increase Amlodipine to 10 mg daily.   If you are unable to work out a payment plan with the Methadone clinic, you may have to present to the emergency room and request to be admitted to a detox program.

## 2017-06-02 ENCOUNTER — Ambulatory Visit (INDEPENDENT_AMBULATORY_CARE_PROVIDER_SITE_OTHER): Payer: Self-pay | Admitting: Orthopaedic Surgery

## 2017-06-09 MED FILL — AMLODIPINE BESYLATE 5 MG TA: 5 | 30 days supply | Qty: 30 | Fill #8

## 2017-06-09 MED FILL — ?ATORVASTATIN 10 MG TABLET: 10 | 30 days supply | Qty: 30 | Fill #7

## 2017-06-09 MED FILL — LISINOPRIL-HCTZ 20-25 MG TA: 20-25 | 30 days supply | Qty: 30 | Fill #8

## 2017-07-02 ENCOUNTER — Encounter (HOSPITAL_COMMUNITY): Payer: Self-pay

## 2017-07-02 ENCOUNTER — Emergency Department (HOSPITAL_COMMUNITY)
Admission: EM | Admit: 2017-07-02 | Discharge: 2017-07-02 | Disposition: A | Discharge status: Home / Self Care | Payer: Self-pay | Attending: Emergency Medicine | Admitting: Emergency Medicine

## 2017-07-02 DIAGNOSIS — F17210 Nicotine dependence, cigarettes, uncomplicated: Secondary | ICD-10-CM | POA: Insufficient documentation

## 2017-07-02 DIAGNOSIS — Z7982 Long term (current) use of aspirin: Secondary | ICD-10-CM | POA: Insufficient documentation

## 2017-07-02 DIAGNOSIS — Z79899 Other long term (current) drug therapy: Secondary | ICD-10-CM | POA: Insufficient documentation

## 2017-07-02 DIAGNOSIS — M545 Low back pain: Secondary | ICD-10-CM | POA: Insufficient documentation

## 2017-07-02 DIAGNOSIS — M7918 Myalgia, other site: Secondary | ICD-10-CM

## 2017-07-02 DIAGNOSIS — I10 Essential (primary) hypertension: Secondary | ICD-10-CM | POA: Insufficient documentation

## 2017-07-02 DIAGNOSIS — Z96652 Presence of left artificial knee joint: Secondary | ICD-10-CM | POA: Insufficient documentation

## 2017-07-02 DIAGNOSIS — M79605 Pain in left leg: Secondary | ICD-10-CM | POA: Insufficient documentation

## 2017-07-02 MED ORDER — KETOROLAC TROMETHAMINE 30 MG/ML IJ SOLN
30.0000 mg | Freq: Once | INTRAMUSCULAR | Status: AC
  Administered 2017-07-02: 30 mg via INTRAMUSCULAR
  Filled 2017-07-02: qty 1

## 2017-07-02 MED ORDER — MELOXICAM 7.5 MG PO TABS: 7.5000 mg | ORAL_TABLET | Freq: Every day | ORAL | 0 refills | Status: DC

## 2017-07-02 MED ORDER — PREDNISONE 10 MG (21) PO TBPK: ORAL_TABLET | Freq: Every day | ORAL | 0 refills | Status: DC

## 2017-07-02 NOTE — ED Provider Notes (Signed)
[facility] Note   CSN: [id number] Arrival date & time: 07/02/17  [id number]     History   Chief Complaint No chief complaint on file.   HPI [name] D Bartus is a 57 y.o. male, status post left knee replacement, hypertension, who presents today for evaluation of pain in his left lateral leg to his knee.  He reports that his pain starts in his left buttocks and radiates down the outside of his leg to his knee.  He reports that this has been present since he returned to work after a left-sided knee replacement a few weeks ago and has been gradually worsening.  He has taken ibuprofen with minimal relief.  He reports that he has taken meloxicam in the past and that has provided moderate relief of his symptoms.  He denies any recent trauma.  No midline lower back pain.  He denies any numbness or tingling on his upper inner thighs or genital area.  No recent fevers or chills.  Denies personal history of IV drug use or cancer.  He denies any changes to bowel or bladder function, no urinary or fecal incontinence.  He has not been back to Dr. Roda ShuttersXu who did his knee replacement since this pain started.    HPI  Past Medical History:  Diagnosis Date  . Arthritis   . Hepatitis    Hep C rx  . Hypertension     Patient Active Problem List   Diagnosis Date Noted  . Positive depression screening 05/22/2017  . Total knee replacement status 02/12/2017  . Hepatitis C virus infection cured after antiviral drug therapy 01/19/2017  . Unilateral primary osteoarthritis, left knee 01/02/2017  . Hyperlipidemia 10/23/2016  . Hypertension 10/18/2016  . Tobacco dependence 10/18/2016  . 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 Right rotator cuff tear 01/29/2013    Past Surgical History:  Procedure Laterality Date  . left knee arthroscopy     . left rotator cuff surgery     . SHOULDER OPEN ROTATOR CUFF REPAIR Right 01/29/2013   Procedure: RIGHT SHOULDER MINI  ROTATOR CUFF REPAIR;  Surgeon: Javier DockerJeffrey C Beane, MD;  Location: [facility];  Service: Orthopedics;  Laterality: Right;  With ANCHORS  . TOTAL KNEE ARTHROPLASTY Left 02/12/2017  . TOTAL KNEE ARTHROPLASTY Left 02/12/2017   Procedure: LEFT TOTAL KNEE ARTHROPLASTY;  Surgeon: Tarry KosXu, Naiping M, MD;  Location: [facility] OR;  Service: Orthopedics;  Laterality: Left;       Home Medications    Prior to Admission medications   Medication Sig Start Date End Date Taking? Authorizing Provider  amLODipine (NORVASC) 10 MG tablet Take 1 tablet (10 mg total) by mouth daily. 05/22/17   Marcine MatarJohnson, Deborah B, MD  aspirin EC 325 MG tablet Take 1 tablet (325 mg total) by mouth 2 (two) times daily. 02/12/17   Tarry KosXu, Naiping M, MD  atorvastatin (LIPITOR) 10 MG tablet Take 1 tablet (10 mg total) by mouth daily. 10/23/16   Marcine MatarJohnson, Deborah B, MD  diclofenac sodium (VOLTAREN) 1 % GEL Apply 2 g topically 4 (four) times daily. Patient not taking: Reported on 03/31/2017 10/18/16   Marcine MatarJohnson, Deborah B, MD  lisinopril-hydrochlorothiazide (PRINZIDE,ZESTORETIC) 20-25 MG tablet Take 1 tablet by mouth daily. 05/22/17   Marcine MatarJohnson, Deborah B, MD  meloxicam (MOBIC) 7.5 MG tablet Take 1-2 tablets (7.5-15 mg total) by mouth daily. 07/02/17   Cristina GongHammond, Kimberley Dastrup W, PA-C  Naphazoline HCl (CLEAR EYES OP) Place 1 drop into both eyes every 4 (four)  hours as needed (dry eyes).    [provider]  nicotine (NICODERM CQ - DOSED IN MG/24 HOURS) 14 mg/24hr patch Place 1 patch (14 mg total) onto the skin daily. 10/18/16   Marcine Matar, MD  nicotine (NICODERM CQ - DOSED IN MG/24 HOURS) 21 mg/24hr patch Place 1 patch (21 mg total) onto the skin daily. 10/18/16   Marcine Matar, MD  nicotine (NICODERM CQ - DOSED IN MG/24 HR) 7 mg/24hr patch Place 1 patch (7 mg total) onto the skin daily. 10/18/16   Marcine Matar, MD  ondansetron (ZOFRAN) 4 MG tablet Take 1-2 tablets (4-8 mg total) by mouth every 8 (eight) hours as needed for nausea or vomiting. Patient not  taking: Reported on 03/31/2017 02/12/17   Tarry Kos, MD  predniSONE (STERAPRED UNI-PAK 21 TAB) 10 MG (21) TBPK tablet Take by mouth daily. Take 6 tabs by mouth daily  for 1 day, then 5 tabs for 1 day, then 4 tabs for 1 day, then 3 tabs for 1 day, 2 tabs for 1 day, then 1 tab by mouth daily for 1 day 07/02/17   Cristina Gong, PA-C  traMADol (ULTRAM) 50 MG tablet Take 1-2 tablets (50-100 mg total) by mouth 3 (three) times daily as needed. Patient not taking: Reported on 03/31/2017 02/12/17   Tarry Kos, MD    Family History Family History  Problem Relation Age of Onset  . CAD Mother   . Deep vein thrombosis Brother     Social History Social History   Tobacco Use  . Smoking status: Current Every Day Smoker    Packs/day: 0.50    Years: 37.00    Pack years: 18.50    Types: Cigarettes  . Smokeless tobacco: Never Used  Substance Use Topics  . Alcohol use: Yes    Comment: occ  . Drug use: No    Comment: hx of marijuana and cocaine, LSD     Allergies   Chantix [varenicline]   Review of Systems Review of Systems  Constitutional: Negative for chills and fever.  Genitourinary: Negative for discharge, genital sores, penile pain, scrotal swelling and testicular pain.  Musculoskeletal: Positive for arthralgias and joint swelling (Not abnormal for him, in left knee). Negative for back pain.  Neurological: Negative for weakness and headaches.     Physical Exam Updated Vital Signs BP (!) 154/110   Pulse 83   Temp 98.1 F (36.7 C) (Oral)   Resp 18   SpO2 99%   Physical Exam  Constitutional: He appears well-developed and well-nourished.  HENT:  Head: Normocephalic and atraumatic.  Cardiovascular: Intact distal pulses.  2+ DP/PT pulses bilaterally.  Pulmonary/Chest: No respiratory distress.  Musculoskeletal:  There is tender to palpation throughout left buttocks, worse over the sciatic notch.  Palpation here both re-creates and exacerbates his pain, she is pain down to  his left knee.  He does not have any midline lower back tenderness to palpation, crepitus, or deformities.  Left lateral lumbar paraspinal muscles are tight, minimally tender.   Left hip has good active range of motion with minimal pain.  Left knee is minimally swollen when compared to the right, there is no abnormal erythema, warmth, or drainage from the left knee.  He has good active range of motion without obvious crepitus.    5/5 strength bilateral lower extremities.  Skin: He is not diaphoretic.  Nursing note and vitals reviewed.    ED Treatments / Results  Labs (all labs ordered  are listed, but only abnormal results are displayed) Labs Reviewed - No data to display  EKG  EKG Interpretation None       Radiology No results found.  Procedures Procedures (including critical care time)  Medications Ordered in ED Medications  ketorolac (TORADOL) 30 MG/ML injection 30 mg (not administered)     Initial Impression / Assessment and Plan / ED Course  I have reviewed the triage vital signs and the nursing notes.  Pertinent labs & imaging results that were available during my care of the patient were reviewed by me and considered in my medical decision making (see chart for details).    Normal neurological exam, no evidence of urinary incontinence or retention, pain is consistently reproducible. There is no evidence of AAA or concern for dissection at this time, CT abdomen pelvis from 2017 reviewed with normal sized aorta.   Patient can walk but states is painful.  No loss of bowel or bladder control.  No concern for cauda equina.  No fever, night sweats, weight loss, h/o cancer, IVDU.  Pain treated here in the department with toradol IM. RICE protocol and pain medicine indicated and discussed with patient.  Narcotics are not indicated for this pain.  He will be given prescriptions for meloxicam, along with prednisone taper to help with pain and inflammation.  I suspect that his  pain is exacerbated secondary to change in biomechanics after knee replacement, and recently returned to work.  I have also discussed reasons to return immediately to the ER.  Patient expresses understanding and agrees with plan.   Final Clinical Impressions(s) / ED Diagnoses   Final diagnoses:  Hypertension, unspecified type  Left buttock pain  Left leg pain    ED Discharge Orders        Ordered    meloxicam (MOBIC) 7.5 MG tablet  Daily     07/02/17 0956    predniSONE (STERAPRED UNI-PAK 21 TAB) 10 MG (21) TBPK tablet  Daily     07/02/17 0956       Cristina Gong, PA-C 07/02/17 1005    Gwyneth Sprout, MD 07/02/17 1559

## 2017-07-02 NOTE — ED Notes (Signed)
Unable to sign e-signature page-tablet error.  Pt verbalized understanding of dc and f/u instructions.  There are no further needs

## 2017-07-02 NOTE — ED Triage Notes (Signed)
Patient here with left leg/back pain radiating to buttocks since recently going back to work following knee replacement 4 months ago.

## 2017-07-02 NOTE — Discharge Instructions (Signed)
While in the ED your blood pressure was high.  Please follow up with your primary care doctor or the wellness clinic for repeat evaluation as you may need medication.  High blood pressure can cause long term, potentially serious, damage if left untreated.   Please take Tylenol (acetaminophen) to relieve your pain.  You may take tylenol, up to 1,000 mg (two extra strength pills).  Do not take more than 3,000 mg tylenol in a 24 hour period.  Please check all medication labels as many medications such as pain and cold medications may contain tylenol. Please do not drink alcohol while taking this medication.   I have given you a prescription for Mobic (meloxicam) today.  Mobic is a NSAID medication and you should not take it with other NSAIDs.  Examples of other NSAIDS include motrin, ibuprofen, aleve, naproxen, and Voltaren.  Please monitor your bowel movements for dark, tarry, sticky stools. If you have any bowel movements like this you need to stop taking mobic and call your doctor as this may represent a stomach ulcer from taking NSAIDS.   I have given you a prescription for steroids today.  Some common side effects include feelings of extra energy, feeling warm, increased appetite, and stomach upset.  If you are diabetic your sugars may run higher than usual.    Please read all medication information that you get with your prescriptions to learn about the risks and benefits of your prescriptions, and possible side effects.

## 2017-07-14 MED FILL — ?AMLODIPINE BESYLATE 5 MG T: 5 MG | 30 days supply | Qty: 30 | Fill #9

## 2017-07-14 MED FILL — LISINOPRIL-HCTZ 20-25 MG TA: 20-25 | 30 days supply | Qty: 30 | Fill #9

## 2017-08-12 MED FILL — LISINOPRIL-HCTZ 20-25 MG TA: 20-25 | 30 days supply | Qty: 30 | Fill #10

## 2017-08-12 MED FILL — AMLODIPINE BESYLATE 5 MG TA: 5 | 30 days supply | Qty: 30 | Fill #10

## 2017-08-12 MED FILL — ATORVASTATIN 10 MG TABLET: 10 | 30 days supply | Qty: 30 | Fill #8

## 2017-08-14 ENCOUNTER — Ambulatory Visit (INDEPENDENT_AMBULATORY_CARE_PROVIDER_SITE_OTHER): Payer: Self-pay

## 2017-08-14 ENCOUNTER — Ambulatory Visit (INDEPENDENT_AMBULATORY_CARE_PROVIDER_SITE_OTHER): Payer: Self-pay | Admitting: Orthopaedic Surgery

## 2017-08-14 ENCOUNTER — Other Ambulatory Visit (INDEPENDENT_AMBULATORY_CARE_PROVIDER_SITE_OTHER): Payer: Self-pay

## 2017-08-14 ENCOUNTER — Encounter (INDEPENDENT_AMBULATORY_CARE_PROVIDER_SITE_OTHER): Payer: Self-pay | Admitting: Orthopaedic Surgery

## 2017-08-14 DIAGNOSIS — M25552 Pain in left hip: Secondary | ICD-10-CM

## 2017-08-14 DIAGNOSIS — G8929 Other chronic pain: Secondary | ICD-10-CM

## 2017-08-14 DIAGNOSIS — M25562 Pain in left knee: Secondary | ICD-10-CM

## 2017-08-14 MED ORDER — METHYLPREDNISOLONE 4 MG PO TBPK: ORAL_TABLET | ORAL | 0 refills | Status: DC

## 2017-08-14 NOTE — Progress Notes (Signed)
Office Visit Note   Patient: Michael [name]           Date of Birth: [birth year]           MRN: [id number] Visit Date: 08/14/2017              RequGriselda Minerested [name]: Michael [name], Michael B, MD [address] [place], [place] [address] PCP: Michael [name], Michael B, MD   Assessment & Plan: Visit Diagnoses:  1. Chronic pain of left knee   2. Pain in left hip     Plan: Impression is left hip osteoarthritis with questionable component of lumbar radiculopathy.  At this point, we will refer Michael [name] to Dr. Alvester [name] for left hip intra-articular cortisone injection.  In the meantime, we will give him a prescription for methylprednisolone Dosepak.  I have told him the swelling in his left knee is likely from his increased activity from work.  He will call with concerns or questions in the meantime.  Follow-up with Michael [name] in 6 months time for repeat evaluation of the left knee if not sooner for his left hip.  Follow-Up Instructions: Return in about 6 months (around 02/14/2018).   Orders:  Orders Placed This Encounter  Procedures  . XR KNEE 3 VIEW LEFT  . XR Pelvis 1-2 Views  . XR Lumbar Spine 2-3 Views   Meds ordered this encounter  Medications  . methylPREDNISolone (MEDROL DOSEPAK) 4 MG TBPK tablet    Sig: Take as directed    Dispense:  21 tablet    Refill:  0      Procedures: No procedures performed   Clinical Data: No additional findings.   Subjective: Chief Complaint  Patient presents with  . Left Knee - Pain    HPI Michael [name] comes in with left lower extremity pain.  He is status post left total knee replacement 02/12/2017.  He is doing excellent until he returned to work at the end of January.  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  The pain he has is in his groin as well as left buttocks.  He describes this as a sharp shooting pain aggravated with increased activity especially at the end of the day.  He was seen in the emergency department where he was given a steroid  Dosepak.  This significantly helped, but his pain returned once he finished the course of treatment.  He does note tingling and burning to both feet.  No previous hip or back pathology.  Review of Systems as detailed in HPI.  All others reviewed and are negative.   Objective: Vital Signs: There were no vitals taken for this visit.  Physical Exam well-developed well-nourished gentleman in no acute distress.  Alert and oriented x3.  Ortho Exam examination of his left lower extremity reveals markedly positive of logroll with minimal internal rotation.  Positive straight leg raise.  Left knee has range of motion from 0-100 degrees.  This is without pain.  He is stable valgus varus stress.  Specialty Comments:  No specialty comments available.  Imaging: Xr Knee 3 View Left  Result Date: 08/14/2017 X-rays of the left knee reveal a well-seated prosthesis without evidence of subsidence or osteolysis  Xr Lumbar Spine 2-3 Views  Result Date: 08/14/2017 X-rays of the lumbar spine reveal some anterior spurring throughout the lower thoracic and upper lumbar spine.  Xr Pelvis 1-2 Views  Result Date: 08/14/2017 X-rays of the left hip reveal marked degenerative changes  PMFS History: Patient Active Problem List   Diagnosis Date Noted  . Pain in left hip 08/14/2017  . Positive depression screening 05/22/2017  . Total knee replacement status 02/12/2017  . Hepatitis C virus infection cured after antiviral drug therapy 01/19/2017  . Unilateral primary osteoarthritis, left knee 01/02/2017  . Hyperlipidemia 10/23/2016  . Hypertension 10/18/2016  . Tobacco dependence 10/18/2016  . Chronic pain of left knee 10/18/2016  . 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 Right rotator cuff tear 01/29/2013   Past Medical History:  Diagnosis Date  . Arthritis   . Hepatitis    Hep C rx  . Hypertension     Family History  Problem Relation Age of Onset  . CAD Mother   . Deep vein thrombosis  Brother     Past Surgical History:  Procedure Laterality Date  . left knee arthroscopy     . left rotator cuff surgery     . SHOULDER OPEN ROTATOR CUFF REPAIR Right 01/29/2013   Procedure: RIGHT SHOULDER MINI ROTATOR CUFF REPAIR;  Surgeon: Javier Docker, MD;  Location: [facility];  Service: Orthopedics;  Laterality: Right;  With ANCHORS  . TOTAL KNEE ARTHROPLASTY Left 02/12/2017  . TOTAL KNEE ARTHROPLASTY Left 02/12/2017   Procedure: LEFT TOTAL KNEE ARTHROPLASTY;  Surgeon: Tarry Kos, MD;  Location: [facility];  Service: Orthopedics;  Laterality: Left;   Social History   Occupational History  . Occupation: Chartered certified accountant  Tobacco Use  . Smoking status: Current Every Day Smoker    Packs/day: 0.50    Years: 37.00    Pack years: 18.50    Types: Cigarettes  . Smokeless tobacco: Never Used  Substance and Sexual Activity  . Alcohol use: Yes    Comment: occ  . Drug use: No    Comment: hx of marijuana and cocaine, LSD  . Sexual activity: Yes    Partners: Female    Birth control/protection: None

## 2017-08-26 ENCOUNTER — Other Ambulatory Visit: Payer: Self-pay

## 2017-08-26 ENCOUNTER — Ambulatory Visit: Payer: Self-pay | Attending: Internal Medicine | Admitting: Internal Medicine

## 2017-08-26 ENCOUNTER — Encounter: Payer: Self-pay | Admitting: Internal Medicine

## 2017-08-26 VITALS — BP 149/94 | HR 78 | Temp 97.7°F | Resp 16 | Wt 249.0 lb

## 2017-08-26 DIAGNOSIS — E669 Obesity, unspecified: Secondary | ICD-10-CM | POA: Insufficient documentation

## 2017-08-26 DIAGNOSIS — I10 Essential (primary) hypertension: Secondary | ICD-10-CM | POA: Insufficient documentation

## 2017-08-26 DIAGNOSIS — E785 Hyperlipidemia, unspecified: Secondary | ICD-10-CM | POA: Insufficient documentation

## 2017-08-26 DIAGNOSIS — Z6834 Body mass index (BMI) 34.0-34.9, adult: Secondary | ICD-10-CM | POA: Insufficient documentation

## 2017-08-26 DIAGNOSIS — F1911 Other psychoactive substance abuse, in remission: Secondary | ICD-10-CM | POA: Insufficient documentation

## 2017-08-26 DIAGNOSIS — M47816 Spondylosis without myelopathy or radiculopathy, lumbar region: Secondary | ICD-10-CM | POA: Insufficient documentation

## 2017-08-26 DIAGNOSIS — F17210 Nicotine dependence, cigarettes, uncomplicated: Secondary | ICD-10-CM | POA: Insufficient documentation

## 2017-08-26 DIAGNOSIS — B1920 Unspecified viral hepatitis C without hepatic coma: Secondary | ICD-10-CM | POA: Insufficient documentation

## 2017-08-26 DIAGNOSIS — Z7982 Long term (current) use of aspirin: Secondary | ICD-10-CM | POA: Insufficient documentation

## 2017-08-26 DIAGNOSIS — M1612 Unilateral primary osteoarthritis, left hip: Secondary | ICD-10-CM | POA: Insufficient documentation

## 2017-08-26 MED ORDER — MELOXICAM 15 MG PO TABS: 15.0000 mg | ORAL_TABLET | Freq: Every day | ORAL | 3 refills | Status: DC

## 2017-08-26 MED ORDER — AMLODIPINE BESYLATE 10 MG PO TABS: 10.0000 mg | ORAL_TABLET | Freq: Every day | ORAL | 3 refills | Status: DC

## 2017-08-26 MED FILL — MELOXICAM 15 MG TABLET: 15 | 30 days supply | Qty: 30 | Fill #0

## 2017-08-26 NOTE — Patient Instructions (Addendum)
Your blood pressure is not controlled.  Increase amlodipine to 10 mg daily.  Continue to limit salt in the foods.  Osteoarthritis of left hip: I have refilled prescription for meloxicam with a dose of 15 mg daily.  Follow a Healthy Eating Plan - You can do it.  Change to wheat bread.   Limit sugary drinks.  Avoid sodas, sweet tea, sport or energy drinks, or fruit drinks.  Drink water, lo-fat milk, or diet drinks. Limit snack foods.   Cut back on candy, cake, cookies, chips, ice cream.  These are a special treat, only in small amounts. Eat plenty of vegetables.  Especially dark green, red, and orange vegetables. Aim for at least 3 servings a day. More is better! Include fruit in your daily diet.  Whole fruit is much healthier than fruit juice! Limit "white" bread, "white" pasta, "white" rice.   Choose "100% whole grain" products, brown or wild rice. Avoid fatty meats. Try "Meatless Monday" and choose eggs or beans one day a week.  When eating meat, choose lean meats like chicken, Malaw[place]urkey, and fish.  Grill, broil, or bake meats instead of frying, and eat poultry without the skin. Eat less salt.  Avoid frozen pizzas, frozen dinners and salty foods.  Use seasonings other than salt in cooking.  This can help blood pressure and keep you from swelling Beer, wine and liquor have calories.  If you can safely drink alcohol, limit to 1 drink per day for women, 2 drinks for men

## 2017-08-26 NOTE — Progress Notes (Signed)
C/o left knee pain and inflammation  Dx'd with sciatica in ED. Took steroid and pain medication for, but flaring up since working.

## 2017-08-26 NOTE — Progress Notes (Signed)
Patient ID: DAMYAN CORNE, male    DOB: 06-12-1960  MRN: [id number]  CC: Knee Pain and Groin Pain   Subjective: Michael [name] is a 57 y.o. male who presents for chronic ds management. His concerns today include:  Patient is a 57 year old Caucasian male with history of hypertension, Hep C treated with interferon/rib combo in [place] 2005, tobacco abuse, substance use disorder(including cocaine)on Methadone for past 6 mths (states he is on it more so for pain in shoulder and knee), HL  Patient seen in the emergency room 06/2017 for pain in lateral left leg.  Pain also in buttocks and groin.  Pain started after he returned to work after a left TKR.  He was taking ibuprofen without relief.  Patient treated with meloxicam and prednisone taper.  He reports that he did well with Prednisone but returned when med was done.  He subsequently saw orthopedic PA on 3/21.  X-rays revealed OA of the left hip and lumbar spine.  He will see Dr. Alvester Morin next  for injection.  -after he has been on his feet for a while, LT knee hurts.  Wears knee sleeve at work  He did get back to [facility] and worked out a Higher education careers adviser.  Goes 3 days a wk.  Was on 80 mg, now on 60 mg.  He is working to try to wean himself off by dec by 10 mg a mth until he is off  HTN: Norvasc inc to 10 mg on last visit and rxn dent to [facility] pharmacy.  However, pt states he never got rxn so he continued with the 5 mg tab.  Compliant with lisinopril HCTZ.  He tries to limit salt in the foods.  No chest pains or shortness of breath. -He has lost 7 pounds since last visit.  Tries to snack on fruits during work shift.  He feels his weakness is bread and Coca[employer]Cola  Patient Active Problem List   Diagnosis Date Noted  . Pain in left hip 08/14/2017  . Positive depression screening 05/22/2017  . Total knee replacement status 02/12/2017  . Hepatitis C virus infection cured after antiviral drug therapy 01/19/2017  . Unilateral primary osteoarthritis,  left knee 01/02/2017  . Hyperlipidemia 10/23/2016  . Hypertension 10/18/2016  . Tobacco dependence 10/18/2016  . Chronic pain of left knee 10/18/2016  . 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 Right rotator cuff tear 01/29/2013     Current Outpatient Medications on File Prior to Visit  Medication Sig Dispense Refill  . aspirin EC 325 MG tablet Take 1 tablet (325 mg total) by mouth 2 (two) times daily. 84 tablet 0  . atorvastatin (LIPITOR) 10 MG tablet Take 1 tablet (10 mg total) by mouth daily. 90 tablet PRN  . lisinopril-hydrochlorothiazide (PRINZIDE,ZESTORETIC) 20-25 MG tablet Take 1 tablet by mouth daily. 90 tablet 3  . Naphazoline HCl (CLEAR EYES OP) Place 1 drop into both eyes every 4 (four) hours as needed (dry eyes).    [name] diclofenac sodium (VOLTAREN) 1 % GEL Apply 2 g topically 4 (four) times daily. (Patient not taking: Reported on 03/31/2017) 100 g 2   No current facility-administered medications on file prior to visit.     Allergies  Allergen Reactions  . Chantix [Varenicline] Itching and Rash    Social History   Socioeconomic History  . Marital status: Single    Spouse name: Not on file  . Number of children: 0  . Years of education: 74  .  Highest education level: Not on file  Occupational History  . Occupation: Chartered certified accountant  Social Needs  . Financial resource strain: Not on file  . Food insecurity:    Worry: Not on file    Inability: Not on file  . Transportation needs:    Medical: Not on file    Non-medical: Not on file  Tobacco Use  . Smoking status: Current Every Day Smoker    Packs/day: 0.50    Years: 37.00    Pack years: 18.50    Types: Cigarettes  . Smokeless tobacco: Never Used  Substance and Sexual Activity  . Alcohol use: Yes    Comment: occ  . Drug use: No    Comment: hx of marijuana and cocaine, LSD  . Sexual activity: Yes    Partners: Female    Birth control/protection: None  Lifestyle  . Physical activity:    Days per week: Not  on file    Minutes per session: Not on file  . Stress: Not on file  Relationships  . Social connections:    Talks on phone: Not on file    Gets together: Not on file    Attends religious service: Not on file    Active member of club or organization: Not on file    Attends meetings of clubs or organizations: Not on file    Relationship status: Not on file  . Intimate partner violence:    Fear of current or ex partner: Not on file    Emotionally abused: Not on file    Physically abused: Not on file    Forced sexual activity: Not on file  Other Topics Concern  . Not on file  Social History Narrative  . Not on file    Family History  Problem Relation Age of Onset  . CAD Mother   . Deep vein thrombosis Brother     Past Surgical History:  Procedure Laterality Date  . left knee arthroscopy     . left rotator cuff surgery     . SHOULDER OPEN ROTATOR CUFF REPAIR Right 01/29/2013   Procedure: RIGHT SHOULDER MINI ROTATOR CUFF REPAIR;  Surgeon: Javier Docker, MD;  Location: [facility];  Service: Orthopedics;  Laterality: Right;  With ANCHORS  . TOTAL KNEE ARTHROPLASTY Left 02/12/2017  . TOTAL KNEE ARTHROPLASTY Left 02/12/2017   Procedure: LEFT TOTAL KNEE ARTHROPLASTY;  Surgeon: Tarry Kos, MD;  Location: [facility];  Service: Orthopedics;  Laterality: Left;    ROS: Review of Systems Negative except as above  PHYSICAL EXAM: BP (!) 149/94 (BP Location: Left Arm, Patient Position: Sitting, Cuff Size: Large)   Pulse 78   Temp 97.7 F (36.5 C) (Oral)   Resp 16   Wt 249 lb (112.9 kg)   SpO2 97%   BMI 34.73 kg/m   Wt Readings from Last 3 Encounters:  08/26/17 249 lb (112.9 kg)  05/22/17 256 lb 9.6 oz (116.4 kg)  02/12/17 239 lb (108.4 kg)    Physical Exam  General appearance - alert, well appearing, and in no distress Mental status - alert, oriented to person, place, and time Neck - supple, no significant adenopathy Chest - clear to auscultation, no wheezes, rales or rhonchi,  symmetric air entry Heart - normal rate, regular rhythm, normal S1, S2, no murmurs, rubs, clicks or gallops Extremities - peripheral pulses normal, no pedal edema, no clubbing or cyanosis   ASSESSMENT AND PLAN: 1. Essential hypertension Not at goal.  Increase Norvasc to 10 mg  as intended.  Continue DASH diet - amLODipine (NORVASC) 10 MG tablet; Take 1 tablet (10 mg total) by mouth daily.  Dispense: 90 tablet; Refill: 3  2. Obesity (BMI 30.0-34.9) Commended him on weight loss so far. Discussed healthy eating habits.  Recommend that he change to wheat bread.  Cut back on sodas  3. Primary osteoarthritis of left hip 4. Osteoarthritis of lumbar spine, unspecified spinal osteoarthritis complication status - meloxicam (MOBIC) 15 MG tablet; Take 1 tablet (15 mg total) by mouth daily.  Dispense: 30 tablet; Refill: 3    Patient was given the opportunity to ask questions.  Patient verbalized understanding of the plan and was able to repeat key elements of the plan.   No orders of the defined types were placed in this encounter.    Requested Prescriptions   Signed Prescriptions Disp Refills  . meloxicam (MOBIC) 15 MG tablet 30 tablet 3    Sig: Take 1 tablet (15 mg total) by mouth daily.  [name]. amLODipine (NORVASC) 10 MG tablet 90 tablet 3    Sig: Take 1 tablet (10 mg total) by mouth daily.    Return in about 3 months (around 11/25/2017).  Jonah Blueeborah Samar Dass, MD, FACP

## 2017-09-01 ENCOUNTER — Ambulatory Visit (INDEPENDENT_AMBULATORY_CARE_PROVIDER_SITE_OTHER): Payer: Self-pay | Admitting: Physical Medicine and Rehabilitation

## 2017-09-01 ENCOUNTER — Telehealth (INDEPENDENT_AMBULATORY_CARE_PROVIDER_SITE_OTHER): Payer: Self-pay | Admitting: Physical Medicine and Rehabilitation

## 2017-09-01 NOTE — Telephone Encounter (Signed)
Patient came into the clinic for an appointment, he thought or misunderstood the time of his appointment.  Please reschedule the appointment.  CB#[phone].  Thank you.

## 2017-09-02 ENCOUNTER — Encounter (INDEPENDENT_AMBULATORY_CARE_PROVIDER_SITE_OTHER): Payer: Self-pay | Admitting: Physical Medicine and Rehabilitation

## 2017-09-02 ENCOUNTER — Ambulatory Visit (INDEPENDENT_AMBULATORY_CARE_PROVIDER_SITE_OTHER): Payer: Self-pay

## 2017-09-02 ENCOUNTER — Ambulatory Visit (INDEPENDENT_AMBULATORY_CARE_PROVIDER_SITE_OTHER): Payer: BLUE CROSS/BLUE SHIELD | Admitting: Physical Medicine and Rehabilitation

## 2017-09-02 DIAGNOSIS — M25552 Pain in left hip: Secondary | ICD-10-CM

## 2017-09-02 MED ORDER — BUPIVACAINE HCL 0.5 % IJ SOLN
3.0000 mL | INTRAMUSCULAR | Status: AC | PRN
  Administered 2017-09-02: 3 mL via INTRA_ARTICULAR

## 2017-09-02 MED ORDER — TRIAMCINOLONE ACETONIDE 40 MG/ML IJ SUSP
80.0000 mg | INTRAMUSCULAR | Status: AC | PRN
  Administered 2017-09-02: 80 mg via INTRA_ARTICULAR

## 2017-09-02 NOTE — Patient Instructions (Signed)

## 2017-09-02 NOTE — Telephone Encounter (Signed)
Called patient and rescheduled.

## 2017-09-02 NOTE — Progress Notes (Signed)
Michael [name] - 57 y.o. male MRN [id number]  Date of birth: [birth year]  Office Visit Note: Visit Date: 09/02/2017 PCP: Marcine MatarJohnson, Deborah B, MD Referred by: Marcine MatarJohnson, Deborah B, MD  Subjective: Chief Complaint  Patient presents with  . Left Hip - Pain   HPI: Michael [name] is a 57 year old gentleman with chronic worsening severe left hip and groin pain that goes into the buttock region.  Michael [name] reports this started around January 2019.  Michael [name] reports worsening with standing and movement.  Michael [name] really has not found anything to make the pain better.  Michael [name] is followed by Dr. Roda ShuttersXu who mainly was seeing him for his knee but now is having worsening hip problems.  X-ray imaging of the pelvis and hip show fairly significant degenerative change.   ROS Otherwise per HPI.  Assessment & Plan: Visit Diagnoses:  1. Pain in left hip     Plan: No additional findings.   Meds & Orders: No orders of the defined types were placed in this encounter.   Orders Placed This Encounter  Procedures  . Large Joint Inj: L hip joint  . XR C-ARM NO REPORT    Follow-up: Return if symptoms worsen or fail to improve, for Dr. Roda ShuttersXu.   Procedures: Large Joint Inj: L hip joint on 09/02/2017 3:38 PM Indications: pain and diagnostic evaluation Details: 22 G needle, anterior approach  Arthrogram: Yes  Medications: 3 mL bupivacaine 0.5 %; 80 mg triamcinolone acetonide 40 MG/ML Outcome: tolerated well, no immediate complications  Arthrogram demonstrated excellent flow of contrast throughout the joint surface without extravasation or obvious defect.  The patient had relief of symptoms during the anesthetic phase of the injection.  Procedure, treatment alternatives, risks and benefits explained, specific risks discussed. Consent was given by the patient. Immediately prior to procedure a time out was called to verify the correct patient, procedure, equipment, support staff and site/side marked as required. Patient was prepped and  draped in the usual sterile fashion.      No notes on file   Clinical History: No specialty comments available.   Michael [name] reports that Michael [name] has been smoking cigarettes.  Michael [name] has a 18.50 pack-year smoking history. Michael [name] has never used smokeless tobacco. No results for input(s): HGBA1C, LABURIC in the last 8760 hours.  Objective:  VS:  HT:    WT:   BMI:     BP:   HR: bpm  TEMP: ( )  RESP:  Physical Exam  Musculoskeletal:  Patient ambulates with an antalgic gait to the left and has concordant hip pain with rotation internally.    Ortho Exam Imaging: Xr C-arm No Report  Result Date: 09/02/2017 Please see Notes or Procedures tab for imaging impression.   Past Medical/Family/Surgical/Social History: Medications & Allergies reviewed per EMR, new medications updated. Patient Active Problem List   Diagnosis Date Noted  . Pain in left hip 08/14/2017  . Positive depression screening 05/22/2017  . Total knee replacement status 02/12/2017  . Hepatitis C virus infection cured after antiviral drug therapy 01/19/2017  . Unilateral primary osteoarthritis, left knee 01/02/2017  . Hyperlipidemia 10/23/2016  . Hypertension 10/18/2016  . Tobacco dependence 10/18/2016  . Chronic pain of left knee 10/18/2016  . 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 Right rotator cuff tear 01/29/2013   Past Medical History:  Diagnosis Date  . Arthritis   . Hepatitis    Hep C rx  . Hypertension    Family History  Problem Relation Age of Onset  .  CAD Mother   . Deep vein thrombosis Brother    Past Surgical History:  Procedure Laterality Date  . left knee arthroscopy     . left rotator cuff surgery     . SHOULDER OPEN ROTATOR CUFF REPAIR Right 01/29/2013   Procedure: RIGHT SHOULDER MINI ROTATOR CUFF REPAIR;  Surgeon: Javier Docker, MD;  Location: [facility];  Service: Orthopedics;  Laterality: Right;  With ANCHORS  . TOTAL KNEE ARTHROPLASTY Left 02/12/2017  . TOTAL KNEE ARTHROPLASTY Left 02/12/2017    Procedure: LEFT TOTAL KNEE ARTHROPLASTY;  Surgeon: Tarry Kos, MD;  Location: [facility];  Service: Orthopedics;  Laterality: Left;   Social History   Occupational History  . Occupation: Chartered certified accountant  Tobacco Use  . Smoking status: Current Every Day Smoker    Packs/day: 0.50    Years: 37.00    Pack years: 18.50    Types: Cigarettes  . Smokeless tobacco: Never Used  Substance and Sexual Activity  . Alcohol use: Yes    Comment: occ  . Drug use: No    Comment: hx of marijuana and cocaine, LSD  . Sexual activity: Yes    Partners: Female    Birth control/protection: None

## 2017-09-02 NOTE — Progress Notes (Signed)
 .  Numeric Pain Rating Scale and Functional Assessment Average Pain 8   In the last MONTH (on 0-10 scale) has pain interfered with the following?  1. General activity like being  able to carry out your everyday physical activities such as walking, climbing stairs, carrying groceries, or moving a chair?  Rating(7)   +Driver, -BT, -Dye Allergies.  

## 2017-09-10 MED FILL — LISINOPRIL-HCTZ 20-25 MG TA: 20-25 | 30 days supply | Qty: 30 | Fill #11

## 2017-09-10 MED FILL — AMLODIPINE BESYLATE 10 MG T: 10 | 30 days supply | Qty: 30 | Fill #0

## 2017-09-10 MED FILL — ATORVASTATIN 10 MG TABLET: 10 | 30 days supply | Qty: 30 | Fill #9

## 2017-09-19 MED FILL — MELOXICAM 15 MG TABLET: 15 | 30 days supply | Qty: 30 | Fill #1

## 2017-10-08 MED FILL — AMLODIPINE BESYLATE 10 MG T: 10 | 30 days supply | Qty: 30 | Fill #1

## 2017-10-08 MED FILL — LISINOPRIL-HCTZ 20-25 MG TA: 20-25 | 30 days supply | Qty: 30 | Fill #12

## 2017-10-08 MED FILL — ATORVASTATIN 10 MG TABLET: 10 | 30 days supply | Qty: 30 | Fill #10

## 2017-11-03 ENCOUNTER — Other Ambulatory Visit: Payer: Self-pay | Admitting: Internal Medicine

## 2017-11-03 MED FILL — AMLODIPINE BESYLATE 10 MG T: 10 | 30 days supply | Qty: 30 | Fill #2

## 2017-11-03 MED FILL — LISINOPRIL-HCTZ 20-25 MG TA: 20-25 | 30 days supply | Qty: 30 | Fill #0

## 2017-11-03 MED FILL — ATORVASTATIN 10 MG TABLET: 10 | 30 days supply | Qty: 30 | Fill #0

## 2017-11-11 MED FILL — MELOXICAM 15 MG TABLET: 15 | 30 days supply | Qty: 30 | Fill #2

## 2017-11-18 ENCOUNTER — Ambulatory Visit (INDEPENDENT_AMBULATORY_CARE_PROVIDER_SITE_OTHER): Payer: Self-pay

## 2017-11-18 ENCOUNTER — Encounter (INDEPENDENT_AMBULATORY_CARE_PROVIDER_SITE_OTHER): Payer: Self-pay | Admitting: Orthopaedic Surgery

## 2017-11-18 ENCOUNTER — Ambulatory Visit (INDEPENDENT_AMBULATORY_CARE_PROVIDER_SITE_OTHER): Payer: BLUE CROSS/BLUE SHIELD | Admitting: Orthopaedic Surgery

## 2017-11-18 DIAGNOSIS — Z96652 Presence of left artificial knee joint: Secondary | ICD-10-CM

## 2017-11-18 MED ORDER — PREDNISONE 10 MG (21) PO TBPK: ORAL_TABLET | ORAL | 0 refills | Status: DC

## 2017-11-18 NOTE — Progress Notes (Signed)
Office Visit Note   Patient: Michael [name]           Date of Birth: Oct 18, 1960           MRN: [id number] Visit Date: 11/18/2017              Requested by: Marcine Matar, MD [address] [place], [place] [address] PCP: Marcine Matar, MD   Assessment & Plan: Visit Diagnoses:  1. History of left knee replacement     Plan: Impression is questionable lucency to the tibial component of the prosthesis.  Because the patient is only 9 months postop, we will obtain a CT scan rather than a bone scan.  He will follow-up with [place] once that has been completed.  Call with concerns or questions in the meantime.  Follow-Up Instructions: Return in about 2 weeks (around 12/02/2017).   Orders:  Orders Placed This Encounter  Procedures  . XR KNEE 3 VIEW LEFT   Meds ordered this encounter  Medications  . predniSONE (STERAPRED UNI-PAK 21 TAB) 10 MG (21) TBPK tablet    Sig: TAKE AS DIRECTED    Dispense:  21 tablet    Refill:  0      Procedures: No procedures performed   Clinical Data: No additional findings.   Subjective: Chief Complaint  Patient presents with  . Left Knee - Pain  . Left Hip - Pain    HPI patient is a pleasant 57 year old gentleman who presents our clinic today 9 months status post left total knee replacement, 02/12/2017.  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  He began having pain to the groin and left buttocks as well increased swelling to the left lower extremity.  No calf pain.  No chest pain or shortness of breath.  He was sent to Dr. Alvester Morin for an intra-articular cortisone injection.  This significantly helped his groin/buttocks pain, but he still has the swelling to the left lower extremity.  No previous Doppler ultrasound.  Review of Systems as detailed in HPI.  All others reviewed and are negative.   Objective: Vital Signs: There were no vitals taken for  this visit.  Physical Exam well-developed and well-nourished gentleman no acute distress.  Alert and oriented x3.  Ortho Exam examination of his left lower extremity reveals moderate swelling throughout.  Calf is soft and nontender.  He has a well-healing surgical incision without evidence of infection or cellulitis.  He is stable to valgus and varus stress.  Specialty Comments:  No specialty comments available.  Imaging: Xr Knee 3 View Left  Result Date: 11/18/2017 Questionable lucency to the tibial component.    PMFS History: Patient Active Problem List   Diagnosis Date Noted  . Pain in left hip 08/14/2017  . Positive depression screening 05/22/2017  . History of left knee replacement 02/12/2017  . Hepatitis C virus infection cured after antiviral drug therapy 01/19/2017  . Unilateral primary osteoarthritis, left knee 01/02/2017  . Hyperlipidemia 10/23/2016  . Hypertension 10/18/2016  . Tobacco dependence 10/18/2016  . Chronic pain of left knee 10/18/2016  . 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 Right rotator cuff tear 01/29/2013   Past Medical History:  Diagnosis Date  . Arthritis   . Hepatitis    Hep C rx  . Hypertension     Family History  Problem Relation Age of Onset  .  CAD Mother   . Deep vein thrombosis Brother     Past Surgical History:  Procedure Laterality Date  . left knee arthroscopy     . left rotator cuff surgery     . SHOULDER OPEN ROTATOR CUFF REPAIR Right 01/29/2013   Procedure: RIGHT SHOULDER MINI ROTATOR CUFF REPAIR;  Surgeon: Javier DockerJeffrey C Beane, MD;  Location: [facility];  Service: Orthopedics;  Laterality: Right;  With ANCHORS  . TOTAL KNEE ARTHROPLASTY Left 02/12/2017  . TOTAL KNEE ARTHROPLASTY Left 02/12/2017   Procedure: LEFT TOTAL KNEE ARTHROPLASTY;  Surgeon: Tarry KosXu, Naiping M, MD;  Location: [facility];  Service: Orthopedics;  Laterality: Left;   Social History   Occupational History  . Occupation: Chartered certified accountantmachinist  Tobacco Use  . Smoking status:  Current Every Day Smoker    Packs/day: 0.50    Years: 37.00    Pack years: 18.50    Types: Cigarettes  . Smokeless tobacco: Never Used  Substance and Sexual Activity  . Alcohol use: Yes    Comment: occ  . Drug use: No    Comment: hx of marijuana and cocaine, LSD  . Sexual activity: Yes    Partners: Female    Birth control/protection: None

## 2017-11-28 ENCOUNTER — Ambulatory Visit: Payer: BLUE CROSS/BLUE SHIELD | Attending: Internal Medicine | Admitting: Internal Medicine

## 2017-11-28 ENCOUNTER — Encounter: Payer: Self-pay | Admitting: Internal Medicine

## 2017-11-28 ENCOUNTER — Ambulatory Visit (HOSPITAL_COMMUNITY)
Admission: RE | Admit: 2017-11-28 | Discharge: 2017-11-28 | Disposition: A | Discharge status: Home / Self Care | Payer: BLUE CROSS/BLUE SHIELD | Source: Ambulatory Visit | Attending: Internal Medicine | Admitting: Internal Medicine

## 2017-11-28 VITALS — BP 144/78 | HR 67 | Temp 97.6°F | Resp 18 | Ht 71.0 in | Wt 250.0 lb

## 2017-11-28 DIAGNOSIS — I10 Essential (primary) hypertension: Secondary | ICD-10-CM | POA: Diagnosis not present

## 2017-11-28 DIAGNOSIS — F17200 Nicotine dependence, unspecified, uncomplicated: Secondary | ICD-10-CM | POA: Diagnosis not present

## 2017-11-28 DIAGNOSIS — Z79899 Other long term (current) drug therapy: Secondary | ICD-10-CM | POA: Insufficient documentation

## 2017-11-28 DIAGNOSIS — E785 Hyperlipidemia, unspecified: Secondary | ICD-10-CM | POA: Insufficient documentation

## 2017-11-28 DIAGNOSIS — R600 Localized edema: Secondary | ICD-10-CM | POA: Diagnosis not present

## 2017-11-28 DIAGNOSIS — Z7982 Long term (current) use of aspirin: Secondary | ICD-10-CM | POA: Diagnosis not present

## 2017-11-28 DIAGNOSIS — Z96652 Presence of left artificial knee joint: Secondary | ICD-10-CM | POA: Insufficient documentation

## 2017-11-28 DIAGNOSIS — M25562 Pain in left knee: Secondary | ICD-10-CM | POA: Insufficient documentation

## 2017-11-28 DIAGNOSIS — F1911 Other psychoactive substance abuse, in remission: Secondary | ICD-10-CM | POA: Diagnosis not present

## 2017-11-28 DIAGNOSIS — F17210 Nicotine dependence, cigarettes, uncomplicated: Secondary | ICD-10-CM | POA: Diagnosis not present

## 2017-11-28 DIAGNOSIS — E669 Obesity, unspecified: Secondary | ICD-10-CM | POA: Insufficient documentation

## 2017-11-28 DIAGNOSIS — G8929 Other chronic pain: Secondary | ICD-10-CM

## 2017-11-28 DIAGNOSIS — Z6834 Body mass index (BMI) 34.0-34.9, adult: Secondary | ICD-10-CM | POA: Insufficient documentation

## 2017-11-28 NOTE — Patient Instructions (Addendum)
Please give appointment with clinical pharmacist in 2-3 weeks for repeat blood pressure check   Obesity, Adult Obesity is having too much body fat. If you have a BMI of 30 or more, you are obese. BMI is a number that explains how much body fat you have. Obesity is often caused by taking in (consuming) more calories than your body uses. Obesity can cause serious health problems. Changing your lifestyle can help to treat obesity. Follow these instructions at home: Eating and drinking   Follow advice from your doctor about what to eat and drink. Your doctor may tell you to: ? Cut down on (limit) fast foods, sweets, and processed snack foods. ? Choose low-fat options. For example, choose low-fat milk instead of whole milk. ? Eat 5 or more servings of fruits or vegetables every day. ? Eat at home more often. This gives you more control over what you eat. ? Choose healthy foods when you eat out. ? Learn what a healthy portion size is. A portion size is the amount of a certain food that is healthy for you to eat at one time. This is different for each person. ? Keep low-fat snacks available. ? Avoid sugary drinks. These include soda, fruit juice, iced tea that is sweetened with sugar, and flavored milk. ? Eat a healthy breakfast.  Drink enough water to keep your pee (urine) clear or pale yellow.  Do not go without eating for long periods of time (do not fast).  Do not go on popular or trendy diets (fad diets). Physical Activity  Exercise often, as told by your doctor. Ask your doctor: ? What types of exercise are safe for you. ? How often you should exercise.  Warm up and stretch before being active.  Do slow stretching after being active (cool down).  Rest between times of being active. Lifestyle  Limit how much time you spend in front of your TV, computer, or video game system (be less sedentary).  Find ways to reward yourself that do not involve food.  Limit alcohol intake to  no more than 1 drink a day for nonpregnant women and 2 drinks a day for men. One drink equals 12 oz of beer, 5 oz of wine, or 1 oz of hard liquor. General instructions  Keep a weight loss journal. This can help you keep track of: ? The food that you eat. ? The exercise that you do.  Take over-the-counter and prescription medicines only as told by your doctor.  Take vitamins and supplements only as told by your doctor.  Think about joining a support group. Your doctor may be able to help with this.  Keep all follow-up visits as told by your doctor. This is important. Contact a doctor if:  You cannot meet your weight loss goal after you have changed your diet and lifestyle for 6 weeks. This information is not intended to replace advice given to you by your health care provider. Make sure you discuss any questions you have with your health care provider. Document Released: 08/05/2011 Document Revised: 10/19/2015 Document Reviewed: 03/01/2015 Elsevier Interactive Patient Education  2018 [employer]

## 2017-11-28 NOTE — Progress Notes (Signed)
Preliminary results by tech - Left Lower Ext. Venous Duplex Completed. Negative for deep vein thrombosis. Naquisha Whitehair, BS, RDMS, RVT  

## 2017-11-28 NOTE — Progress Notes (Signed)
Patient ID: Michael [name], male    DOB: Jun 14, 1960  MRN: [id number]  CC: Follow-up (knee pain)   Subjective: Michael [name] is a 57 y.o. male who presents for chronic ds management. His concerns today include:  Patient is a 57 year old Caucasian male with history of HTN, OA LT hip and lumbar spine Hep C treated with interferon/rib combo in [place] 2005, tobacco abuse, substance use disorder(including cocaine)on Methadone (states he is on it more so for pain in shoulder and knee), HL  OA hip/lumbar:  Saw Dr. Alvester Morin and had inj to LT hip which helped -LT knee continues to swell especially when he is on it a lot at work.  Now on light duty for the past 1 wk where he is not standing constantly.   -x-rays revealed lucency around tibia.  CT has been ordered to check for any loosening of hardware in the knee.Michael [name]   HTN:  Compliant with meds.  Checks BP occasional at [facility].  Limits salt No chest pains or shortness of breath.  Tob dep:  At 1/2 to 2/3 pk a day.  Has the patches but not using consistently; smokes when he gets stress.  Situation with his knee has been stressing him out a lot.  Obesity:  Wanting to lose wgh.  He has cut back on the amount he eats.  Carries sandwich and fruits to snack on at work. -drinks water and Coca-Cola about 2 day.     Patient Active Problem List   Diagnosis Date Noted  . Pain in left hip 08/14/2017  . Positive depression screening 05/22/2017  . History of left knee replacement 02/12/2017  . Hepatitis C virus infection cured after antiviral drug therapy 01/19/2017  . Unilateral primary osteoarthritis, left knee 01/02/2017  . Hyperlipidemia 10/23/2016  . Hypertension 10/18/2016  . Tobacco dependence 10/18/2016  . Chronic pain of left knee 10/18/2016  . 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 Right rotator cuff tear 01/29/2013     Current Outpatient Medications on File Prior to Visit  Medication Sig Dispense Refill  . amLODipine (NORVASC) 10  MG tablet Take 1 tablet (10 mg total) by mouth daily. 90 tablet 3  . aspirin EC 325 MG tablet Take 1 tablet (325 mg total) by mouth 2 (two) times daily. 84 tablet 0  . atorvastatin (LIPITOR) 10 MG tablet TAKE 1 TABLET BY MOUTH DAILY. 90 tablet PRN  . lisinopril-hydrochlorothiazide (PRINZIDE,ZESTORETIC) 20-25 MG tablet Take 1 tablet by mouth daily. 90 tablet 3  . meloxicam (MOBIC) 15 MG tablet Take 1 tablet (15 mg total) by mouth daily. 30 tablet 3  . Naphazoline HCl (CLEAR EYES OP) Place 1 drop into both eyes every 4 (four) hours as needed (dry eyes).    Michael [name] diclofenac sodium (VOLTAREN) 1 % GEL Apply 2 g topically 4 (four) times daily. (Patient not taking: Reported on 11/28/2017) 100 g 2   No current facility-administered medications on file prior to visit.     Allergies  Allergen Reactions  . Chantix [Varenicline] Itching and Rash    Social History   Socioeconomic History  . Marital status: Single    Spouse name: Not on file  . Number of children: 0  . Years of education: 95  . Highest education level: Not on file  Occupational History  . Occupation: Chartered certified accountant  Social Needs  . Financial resource strain: Not on file  . Food insecurity:    Worry: Not on file    Inability: Not on  file  . Transportation needs:    Medical: Not on file    Non-medical: Not on file  Tobacco Use  . Smoking status: Current Every Day Smoker    Packs/day: 0.50    Years: 37.00    Pack years: 18.50    Types: Cigarettes  . Smokeless tobacco: Never Used  Substance and Sexual Activity  . Alcohol use: Yes    Comment: occ  . Drug use: No    Comment: hx of marijuana and cocaine, LSD  . Sexual activity: Yes    Partners: Female    Birth control/protection: None  Lifestyle  . Physical activity:    Days per week: Not on file    Minutes per session: Not on file  . Stress: Not on file  Relationships  . Social connections:    Talks on phone: Not on file    Gets together: Not on file    Attends religious  service: Not on file    Active member of club or organization: Not on file    Attends meetings of clubs or organizations: Not on file    Relationship status: Not on file  . Intimate partner violence:    Fear of current or ex partner: Not on file    Emotionally abused: Not on file    Physically abused: Not on file    Forced sexual activity: Not on file  Other Topics Concern  . Not on file  Social History Narrative  . Not on file    Family History  Problem Relation Age of Onset  . CAD Mother   . Deep vein thrombosis Brother     Past Surgical History:  Procedure Laterality Date  . left knee arthroscopy     . left rotator cuff surgery     . SHOULDER OPEN ROTATOR CUFF REPAIR Right 01/29/2013   Procedure: RIGHT SHOULDER MINI ROTATOR CUFF REPAIR;  Surgeon: Javier DockerJeffrey C Beane, MD;  Location: [facility];  Service: Orthopedics;  Laterality: Right;  With ANCHORS  . TOTAL KNEE ARTHROPLASTY Left 02/12/2017  . TOTAL KNEE ARTHROPLASTY Left 02/12/2017   Procedure: LEFT TOTAL KNEE ARTHROPLASTY;  Surgeon: Tarry KosXu, Naiping M, MD;  Location: [facility];  Service: Orthopedics;  Laterality: Left;    ROS: Review of Systems Negative except as stated above. PHYSICAL EXAM: BP (!) 144/78 (BP Location: Left Arm, Patient Position: Sitting, Cuff Size: Normal)   Pulse 67   Temp 97.6 F (36.4 C) (Oral)   Resp 18   Ht 5\' 11"  (1.803 m)   Wt 250 lb (113.4 kg)   SpO2 96%   BMI 34.87 kg/m   Repeat BP 152/70 Physical Exam General appearance - alert, well appearing, and in no distress Mental status - normal mood, behavior, speech, dress, motor activity, and thought processes Neck - supple, no significant adenopathy Chest - clear to auscultation, no wheezes, rales or rhonchi, symmetric air entry Heart - normal rate, regular rhythm, normal S1, S2, no murmurs, rubs, clicks or gallops Extremities - moderate edema of the left knee.  1+ edema of left lower extremity.  ASSESSMENT AND PLAN: 1. Essential hypertension Blood  pressure not at goal of 130/80 or lower We discussed starting another blood pressure medication but patient wants to hold off for now and come back for recheck.  We will set him up with our clinical pharmacist in about 3 to 4 weeks.  If still elevated, plan at that time would be to add a low-dose of lisinopril like 5 mg to  the current lisinopril/HCTZ  2. Obesity (BMI 30.0-34.9) Dietary counseling given.  Advised to cut out sugary drinks.  He is not able to do much in terms of physical activity due to his left knee issue.  3. Tobacco dependence Advised to quit.  Discussed other ways to deal with stress.  He is not ready to quit completely.  4. Chronic pain of left knee Status post total knee replacement.  Being followed by Ortho.  CT of the knee pending.  5. Leg edema, left - VAS [place] LOWER EXTREMITY VENOUS (DVT); Future  Patient was given the opportunity to ask questions.  Patient verbalized understanding of the plan and was able to repeat key elements of the plan.   No orders of the defined types were placed in this encounter.    Requested Prescriptions    No prescriptions requested or ordered in this encounter    No follow-ups on file.  Jonah Blue, MD, FACP

## 2017-12-01 ENCOUNTER — Encounter (INDEPENDENT_AMBULATORY_CARE_PROVIDER_SITE_OTHER): Payer: Self-pay

## 2017-12-01 ENCOUNTER — Telehealth (INDEPENDENT_AMBULATORY_CARE_PROVIDER_SITE_OTHER): Payer: Self-pay | Admitting: Orthopaedic Surgery

## 2017-12-01 NOTE — Telephone Encounter (Signed)
Extend it for another 6 weeks

## 2017-12-01 NOTE — Telephone Encounter (Signed)
Please advise 

## 2017-12-01 NOTE — Telephone Encounter (Signed)
Note made ready for pick up tomorrow AM

## 2017-12-01 NOTE — Telephone Encounter (Signed)
Patient called stating that his light duty note will expire tomorrow and needs a new note.  CB#[phone].   Thank you.

## 2017-12-02 ENCOUNTER — Ambulatory Visit: Payer: BLUE CROSS/BLUE SHIELD | Attending: Internal Medicine

## 2017-12-02 NOTE — Telephone Encounter (Signed)
Called patient to let him know note is ready for pick up

## 2017-12-10 MED FILL — AMLODIPINE BESYLATE 10 MG T: 10 | 30 days supply | Qty: 30 | Fill #3

## 2017-12-10 MED FILL — LISINOPRIL-HCTZ 20-25 MG TA: 20-25 | 30 days supply | Qty: 30 | Fill #1

## 2017-12-10 MED FILL — ATORVASTATIN 10 MG TABLET: 10 | 30 days supply | Qty: 30 | Fill #1

## 2017-12-11 ENCOUNTER — Ambulatory Visit
Admission: RE | Admit: 2017-12-11 | Discharge: 2017-12-11 | Disposition: A | Discharge status: Home / Self Care | Payer: BLUE CROSS/BLUE SHIELD | Source: Ambulatory Visit | Attending: Physician Assistant | Admitting: Physician Assistant

## 2017-12-11 DIAGNOSIS — M25462 Effusion, left knee: Secondary | ICD-10-CM | POA: Diagnosis not present

## 2017-12-11 DIAGNOSIS — Z96652 Presence of left artificial knee joint: Secondary | ICD-10-CM

## 2017-12-11 NOTE — Progress Notes (Signed)
Fyi.  Should we call or have him come in to discuss?

## 2017-12-12 NOTE — Progress Notes (Signed)
F/u appt ?

## 2017-12-16 ENCOUNTER — Ambulatory Visit (INDEPENDENT_AMBULATORY_CARE_PROVIDER_SITE_OTHER): Payer: BLUE CROSS/BLUE SHIELD | Admitting: Orthopaedic Surgery

## 2017-12-16 ENCOUNTER — Encounter (INDEPENDENT_AMBULATORY_CARE_PROVIDER_SITE_OTHER): Payer: Self-pay | Admitting: Orthopaedic Surgery

## 2017-12-16 DIAGNOSIS — Z96652 Presence of left artificial knee joint: Secondary | ICD-10-CM

## 2017-12-16 MED ORDER — MELOXICAM 7.5 MG PO TABS: 7.5000 mg | ORAL_TABLET | Freq: Every day | ORAL | 2 refills | Status: DC | PRN

## 2017-12-16 NOTE — Progress Notes (Signed)
Patient is a pleasant 57 year old gentleman who presents to our clinic today to discuss CT results of the left knee.  History of left total knee replacement, date of surgery 02/12/2017.  Doing well until he returned to work at the beginning of the year.  Since then, he has had significant pain and swelling.  He is on his feet all day working long hours.  CT of the left knee showed intact tibial and femoral components without evidence of loosening or fracture.  At this point, we believe the patient is having swelling from being on his feet at work on a daily basis.  They do not have an option for light duty or desk work.  He will continue to ice and elevate his knees at night.  I will also place him on a scheduled anti-inflammatory.  I have counseled him on the use of NSAIDs.  He will follow-up with us in 2 months when he is 1 year out from surgery.

## 2017-12-18 ENCOUNTER — Encounter: Payer: Self-pay | Admitting: Pharmacist

## 2017-12-18 NOTE — Progress Notes (Deleted)
    S:    PCP: Dr. Laural BenesJohnson  Patient arrives ***.    Presents to the clinic for hypertension evaluation, counseling, and management. Patient was referred by Dr. Laural BenesJohnson on 11/28/17.  Patient was last seen by Primary Care Provider on that day.  HPI:  Office visit 11/28/17 with PCP:  BP was 144/78. BP goal was discussed and patient decided to hold off on adding another medication to his regimen. Dr. Laural BenesJohnson recommends adding lisinopril low-dose to combination lisinopril/HCTZ that patient is currently taking.   Patient {Actions; denies-reports:120008} adherence with medications.  Current BP Medications include:  Amlodipine 10 mg daily, lisinopril-hctz 20-25 mg daily  Antihypertensives tried in the past include: ***  Dietary habits include: *** Exercise habits include:*** Family / Social history: ***  ASCVD risk factors include:***  Home BP readings: ***  O:  Last 3 Office BP readings: BP Readings from Last 3 Encounters:  11/28/17 (!) 144/78  08/26/17 (!) 149/94  07/02/17 (!) 141/85    BMET    Component Value Date/Time   NA 135 02/13/2017 0252   NA 138 10/22/2016 0908   K 4.3 02/13/2017 0252   CL 100 (L) 02/13/2017 0252   CO2 27 02/13/2017 0252   GLUCOSE 118 (H) 02/13/2017 0252   BUN 16 02/13/2017 0252   BUN 17 10/22/2016 0908   CREATININE 0.79 02/13/2017 0252   CALCIUM 8.9 02/13/2017 0252   GFRNONAA >60 02/13/2017 0252   GFRAA >60 02/13/2017 0252    Renal function: CrCl cannot be calculated (Patient's most recent lab result is older than the maximum 21 days allowed.).  A/P: Hypertension longstanding/newly diagnosed currently *** on current medications. BP Goal <130/80 mmHg. Patient {Is/is not:9024} adherent with current medications.  -{Meds adjust:18428} ***.  -F/u labs ordered - *** -Counseled on lifestyle modifications for blood pressure control including reduced dietary sodium, increased exercise, adequate sleep  Results reviewed and written information provided.    Total time in face-to-face counseling *** minutes.   F/U Clinic Visit in ***.  Patient seen with ***

## 2018-01-07 MED FILL — LISINOPRIL-HCTZ 20-25 MG TA: 20-25 | 30 days supply | Qty: 30 | Fill #2

## 2018-01-07 MED FILL — AMLODIPINE BESYLATE 10 MG T: 10 | 30 days supply | Qty: 30 | Fill #4

## 2018-01-07 MED FILL — ATORVASTATIN 10 MG TABLET: 10 | 30 days supply | Qty: 30 | Fill #2

## 2018-02-02 MED FILL — AMLODIPINE BESYLATE 10 MG T: 10 | 30 days supply | Qty: 30 | Fill #5

## 2018-02-02 MED FILL — ATORVASTATIN 10 MG TABLET: 10 | 30 days supply | Qty: 30 | Fill #3

## 2018-02-02 MED FILL — MELOXICAM 15 MG TABLET: 15 | 30 days supply | Qty: 30 | Fill #3

## 2018-02-02 MED FILL — LISINOPRIL-HCTZ 20-25 MG TA: 20-25 | 30 days supply | Qty: 30 | Fill #3

## 2018-02-17 ENCOUNTER — Encounter (INDEPENDENT_AMBULATORY_CARE_PROVIDER_SITE_OTHER): Payer: Self-pay | Admitting: Orthopaedic Surgery

## 2018-02-17 ENCOUNTER — Ambulatory Visit (INDEPENDENT_AMBULATORY_CARE_PROVIDER_SITE_OTHER): Payer: BLUE CROSS/BLUE SHIELD | Admitting: Orthopaedic Surgery

## 2018-02-17 DIAGNOSIS — Z96652 Presence of left artificial knee joint: Secondary | ICD-10-CM | POA: Diagnosis not present

## 2018-02-17 NOTE — Progress Notes (Signed)
Office Visit Note   Patient: Michael [name]           Date of Birth: 16-Jul-1960           MRN: [id number] Visit Date: 02/17/2018              Requested by: Marcine Matar, MD [address] [place], [place] [address] PCP: Marcine Matar, MD   Assessment & Plan: Visit Diagnoses:  1. History of left knee replacement     Plan: Overall I think is that he has excess fluid and edema throughout his body.  He may want to discuss with his primary care doctor to see if increasing his dose of his diuretic may help him.  From my standpoint his work-up has been negative for any issues with the knee replacement.  I would like to recheck him in a year with 2 view x-rays of the left knee.  He knows to return sooner if there is any issues.  Follow-Up Instructions: Return in about 1 year (around 02/18/2019).   Orders:  No orders of the defined types were placed in this encounter.  No orders of the defined types were placed in this encounter.     Procedures: No procedures performed   Clinical Data: No additional findings.   Subjective: Chief Complaint  Patient presents with  . Left Knee - Follow-up    Michael [name] is 1 year status post left total knee replacement.  He states that he is overall doing well and he complains of swelling.  But he also does have problems with edema.  Overall he states that the left knee replacement has helped him.   Review of Systems   Objective: Vital Signs: There were no vitals taken for this visit.  Physical Exam  Ortho Exam Left knee exam shows a fully healed surgical scar.  He is excellent range of motion.  He does have pitting edema bilaterally. Specialty Comments:  No specialty comments available.  Imaging: No results found.   PMFS History: Patient Active Problem List   Diagnosis Date Noted  . Pain in left hip 08/14/2017  . Positive depression screening 05/22/2017  . History of left knee replacement 02/12/2017  . Hepatitis C  virus infection cured after antiviral drug therapy 01/19/2017  . Unilateral primary osteoarthritis, left knee 01/02/2017  . Hyperlipidemia 10/23/2016  . Hypertension 10/18/2016  . Tobacco dependence 10/18/2016  . Chronic pain of left knee 10/18/2016  . 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 Right rotator cuff tear 01/29/2013   Past Medical History:  Diagnosis Date  . Arthritis   . Hepatitis    Hep C rx  . Hypertension     Family History  Problem Relation Age of Onset  . CAD Mother   . Deep vein thrombosis Brother     Past Surgical History:  Procedure Laterality Date  . left knee arthroscopy     . left rotator cuff surgery     . SHOULDER OPEN ROTATOR CUFF REPAIR Right 01/29/2013   Procedure: RIGHT SHOULDER MINI ROTATOR CUFF REPAIR;  Surgeon: Javier Docker, MD;  Location: [facility];  Service: Orthopedics;  Laterality: Right;  With ANCHORS  . TOTAL KNEE ARTHROPLASTY Left 02/12/2017  . TOTAL KNEE ARTHROPLASTY Left 02/12/2017   Procedure: LEFT TOTAL KNEE ARTHROPLASTY;  Surgeon: Tarry Kos, MD;  Location: [facility];  Service: Orthopedics;  Laterality: Left;   Social History   Occupational History  . Occupation: Chartered certified accountant  Tobacco Use  .  Smoking status: Current Every Day Smoker    Packs/day: 0.50    Years: 37.00    Pack years: 18.50    Types: Cigarettes  . Smokeless tobacco: Never Used  Substance and Sexual Activity  . Alcohol use: Yes    Comment: occ  . Drug use: No    Comment: hx of marijuana and cocaine, LSD  . Sexual activity: Yes    Partners: Female    Birth control/protection: None

## 2018-03-02 ENCOUNTER — Ambulatory Visit: Payer: BLUE CROSS/BLUE SHIELD | Attending: Internal Medicine | Admitting: Internal Medicine

## 2018-03-02 ENCOUNTER — Encounter: Payer: Self-pay | Admitting: Internal Medicine

## 2018-03-02 VITALS — BP 140/78 | HR 79 | Temp 98.0°F | Resp 16 | Ht 71.0 in | Wt 252.8 lb

## 2018-03-02 DIAGNOSIS — I10 Essential (primary) hypertension: Secondary | ICD-10-CM | POA: Diagnosis not present

## 2018-03-02 DIAGNOSIS — M1612 Unilateral primary osteoarthritis, left hip: Secondary | ICD-10-CM | POA: Diagnosis not present

## 2018-03-02 DIAGNOSIS — F1911 Other psychoactive substance abuse, in remission: Secondary | ICD-10-CM | POA: Insufficient documentation

## 2018-03-02 DIAGNOSIS — Z23 Encounter for immunization: Secondary | ICD-10-CM

## 2018-03-02 DIAGNOSIS — Z96652 Presence of left artificial knee joint: Secondary | ICD-10-CM | POA: Diagnosis not present

## 2018-03-02 DIAGNOSIS — E669 Obesity, unspecified: Secondary | ICD-10-CM | POA: Diagnosis not present

## 2018-03-02 DIAGNOSIS — I8393 Asymptomatic varicose veins of bilateral lower extremities: Secondary | ICD-10-CM | POA: Diagnosis not present

## 2018-03-02 DIAGNOSIS — G8929 Other chronic pain: Secondary | ICD-10-CM | POA: Diagnosis not present

## 2018-03-02 DIAGNOSIS — M7989 Other specified soft tissue disorders: Secondary | ICD-10-CM | POA: Insufficient documentation

## 2018-03-02 DIAGNOSIS — F17210 Nicotine dependence, cigarettes, uncomplicated: Secondary | ICD-10-CM | POA: Insufficient documentation

## 2018-03-02 DIAGNOSIS — Z79899 Other long term (current) drug therapy: Secondary | ICD-10-CM | POA: Diagnosis not present

## 2018-03-02 DIAGNOSIS — M25462 Effusion, left knee: Secondary | ICD-10-CM | POA: Diagnosis not present

## 2018-03-02 DIAGNOSIS — Z6839 Body mass index (BMI) 39.0-39.9, adult: Secondary | ICD-10-CM | POA: Insufficient documentation

## 2018-03-02 DIAGNOSIS — F17200 Nicotine dependence, unspecified, uncomplicated: Secondary | ICD-10-CM

## 2018-03-02 DIAGNOSIS — E785 Hyperlipidemia, unspecified: Secondary | ICD-10-CM | POA: Diagnosis not present

## 2018-03-02 DIAGNOSIS — Z7982 Long term (current) use of aspirin: Secondary | ICD-10-CM | POA: Diagnosis not present

## 2018-03-02 DIAGNOSIS — M25562 Pain in left knee: Secondary | ICD-10-CM | POA: Insufficient documentation

## 2018-03-02 DIAGNOSIS — Z8619 Personal history of other infectious and parasitic diseases: Secondary | ICD-10-CM | POA: Diagnosis not present

## 2018-03-02 MED ORDER — PREDNISONE 10 MG PO TABS: ORAL_TABLET | ORAL | 0 refills | Status: DC

## 2018-03-02 NOTE — Progress Notes (Signed)
Pt. Is here for 3 months follow-up.  Pt. Would like a second opinion for his left knee swelling.

## 2018-03-02 NOTE — Patient Instructions (Signed)
Please cut back on salt in the foods. I recommend wearing your compression stockings daily but you probably need to get a larger pair.  Please call and let me know the name of the orthopedic specialist that she would like to be referred to.

## 2018-03-02 NOTE — Progress Notes (Signed)
Patient ID: Michael [name], male    DOB: 17-Nov-1960  MRN: [id number]  CC: chronic ds management  Subjective: Michael [name] is a 57 y.o. male who presents for chronic disease management His concerns today include:   Pt with hx of HTN, OA LT hip and lumbar spine, Hep C treated with interferon/rib combo in [place] 2005, tobacco abuse, substance use disorderon Methadone (states he is on it more so for pain in shoulder and knee), HL  Pt frustrated about his LT knee:  Continues to have a lot of swelling in the knee after TKR 01/2017. Hears and feels a popping in the knee when going up stairs.  States the only thing that reduces the swelling is Prednisone.  Saw Dr. Roda Shutters a few times since last visit with me.  CT scan of the knee done 12/11/2017 revealed grossly intact prosthesis; moderate sized knee jt effusion.  Last saw Dr. Roda Shutters 02/17/2018.  His assessment was that he thinks the patient has excess fluid and edema throughout his body and told him to speak with me about increasing the dose of his diuretic.  He plans to recheck him in 1 year.  Patient reports that the swelling and sometimes pain has affected his quality of life.  Not doing as much standing at work as before.  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  He is frustrated that he is not able to do as much walking as he would like to to help get his weight down.  He is requesting a second opinion about his knee.  He was compression socks on his legs during the day.  But he has significant swelling in the left knee at the end of the day when he wears the compression socks on this leg.  HTN: checks BP 2 x a wk at [facility].  Did not bring log.  Gives range of 140/86.  Tries to limit salt in foods and avoids process foods.  Notice swelling in legs when he eats too much salty foods.  -no CP/SOB. -took Lis/HCTZ and Amlodipine already for today.    HL:  Compliant and tolerating Lipitor.  Substance Abuse:  On Methadone 50  mg 3 x a wk trying to wean off of it.  Denies any street drug use.  He drinks alcohol occasionally.  Last drank about 3 weeks ago when he was at the beach.  Down to 1/2 pack of cigarettes a day.  Using some nicotine alternative product to wean himself down further.  He is not sure what it is called and not sure if it is vaping.  Patient Active Problem List   Diagnosis Date Noted  . Pain in left hip 08/14/2017  . Positive depression screening 05/22/2017  . History of left knee replacement 02/12/2017  . Hepatitis C virus infection cured after antiviral drug therapy 01/19/2017  . Unilateral primary osteoarthritis, left knee 01/02/2017  . Hyperlipidemia 10/23/2016  . Hypertension 10/18/2016  . Tobacco dependence 10/18/2016  . Chronic pain of left knee 10/18/2016  . 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 Right rotator cuff tear 01/29/2013     Current Outpatient Medications on File Prior to Visit  Medication Sig Dispense Refill  . amLODipine (NORVASC) 10 MG tablet Take 1 tablet (10 mg total) by mouth daily. 90 tablet 3  . aspirin EC 325 MG tablet Take 1 tablet (325 mg total) by mouth 2 (two) times daily. 84 tablet  0  . atorvastatin (LIPITOR) 10 MG tablet TAKE 1 TABLET BY MOUTH DAILY. 90 tablet PRN  . diclofenac sodium (VOLTAREN) 1 % GEL Apply 2 g topically 4 (four) times daily. 100 g 2  . lisinopril-hydrochlorothiazide (PRINZIDE,ZESTORETIC) 20-25 MG tablet Take 1 tablet by mouth daily. 90 tablet 3  . meloxicam (MOBIC) 7.5 MG tablet Take 1 tablet (7.5 mg total) by mouth daily as needed for pain. 30 tablet 2  . methadone (DOLOPHINE) 10 MG tablet Take 60 mg by mouth daily.    . Naphazoline HCl (CLEAR EYES OP) Place 1 drop into both eyes every 4 (four) hours as needed (dry eyes).     No current facility-administered medications on file prior to visit.     Allergies  Allergen Reactions  . Chantix [Varenicline] Itching and Rash    Social History   Socioeconomic History  . Marital  status: Single    Spouse name: Not on file  . Number of children: 0  . Years of education: 1  . Highest education level: Not on file  Occupational History  . Occupation: Chartered certified accountant  Social Needs  . Financial resource strain: Not on file  . Food insecurity:    Worry: Not on file    Inability: Not on file  . Transportation needs:    Medical: Not on file    Non-medical: Not on file  Tobacco Use  . Smoking status: Current Every Day Smoker    Packs/day: 0.50    Years: 37.00    Pack years: 18.50    Types: Cigarettes  . Smokeless tobacco: Never Used  Substance and Sexual Activity  . Alcohol use: Yes    Comment: occ  . Drug use: No    Comment: hx of marijuana and cocaine, LSD  . Sexual activity: Yes    Partners: Female    Birth control/protection: None  Lifestyle  . Physical activity:    Days per week: Not on file    Minutes per session: Not on file  . Stress: Not on file  Relationships  . Social connections:    Talks on phone: Not on file    Gets together: Not on file    Attends religious service: Not on file    Active member of club or organization: Not on file    Attends meetings of clubs or organizations: Not on file    Relationship status: Not on file  . Intimate partner violence:    Fear of current or ex partner: Not on file    Emotionally abused: Not on file    Physically abused: Not on file    Forced sexual activity: Not on file  Other Topics Concern  . Not on file  Social History Narrative  . Not on file    Family History  Problem Relation Age of Onset  . CAD Mother   . Deep vein thrombosis Brother     Past Surgical History:  Procedure Laterality Date  . left knee arthroscopy     . left rotator cuff surgery     . SHOULDER OPEN ROTATOR CUFF REPAIR Right 01/29/2013   Procedure: RIGHT SHOULDER MINI ROTATOR CUFF REPAIR;  Surgeon: Javier Docker, MD;  Location: [facility];  Service: Orthopedics;  Laterality: Right;  With ANCHORS  . TOTAL KNEE ARTHROPLASTY Left  02/12/2017  . TOTAL KNEE ARTHROPLASTY Left 02/12/2017   Procedure: LEFT TOTAL KNEE ARTHROPLASTY;  Surgeon: Tarry Kos, MD;  Location: [facility];  Service: Orthopedics;  Laterality: Left;  ROS: Review of Systems Negative except as above.  PHYSICAL EXAM: BP 140/78   Pulse 79   Temp 98 F (36.7 C) (Oral)   Resp 16   Ht 5\' 11"  (1.803 m)   Wt 252 lb 12.8 oz (114.7 kg)   SpO2 96%   BMI 35.26 kg/m   Wt Readings from Last 3 Encounters:  03/02/18 252 lb 12.8 oz (114.7 kg)  11/28/17 250 lb (113.4 kg)  08/26/17 249 lb (112.9 kg)    Physical Exam General appearance - alert, well appearing, and in no distress Mental status - normal mood, behavior, speech, dress, motor activity, and thought processes Neck - supple, no significant adenopathy Chest - clear to auscultation, no wheezes, rales or rhonchi, symmetric air entry Heart - normal rate, regular rhythm, normal S1, S2, no murmurs, rubs, clicks or gallops Musculoskeletal -left knee: Moderate edema.  Increased warmth.  No erythema.  Adequate range of motion.   Extremities -mild varicose veins of both calf.  Trace lower extremity edema.  ASSESSMENT AND PLAN: 1. Essential hypertension Not at goal but repeat blood pressure was acceptable.  Continue current dose of amlodipine and losartan/HCTZ.  Encouraged DASH diet. - CBC - Comprehensive metabolic panel  2. Swelling of joint, knee, left Patient informed that using prednisone to control the swelling in his knee is not a long-term option.  We will give limited dose of prednisone today.  He check with his insurance to see which orthopedic specialist is in his network and then will call back to let me know where to submit the referral for the second opinion. - predniSONE (DELTASONE) 10 MG tablet; 3 tabs PO x 1 day then 2 tabs PO x 2 days then 1 tab PO x 3 days  Dispense: 10 tablet; Refill: 0  3. Varicose veins of both lower extremities, unspecified whether complicated Encouraged to use the  compression socks especially when at work.  Advised that he get a larger pair  4. Hyperlipidemia, unspecified hyperlipidemia type - Lipid panel  5. Tobacco dependence Patient advised to quit smoking. Discussed health risks associated with smoking including lung and other types of cancers, chronic lung diseases and CV risks..  6. Substance abuse in remission [name]) Currently being weaned off methadone.  7. Obesity (BMI 35.0-39.9 without comorbidity) - Hemoglobin A1c Discussed importance of healthy eating habits and regular exercise as tolerated given his knee issue.  8. Need for immunization against influenza - Flu Vaccine QUAD 36+ mos IM  Patient was given the opportunity to ask questions.  Patient verbalized understanding of the plan and was able to repeat key elements of the plan.   Orders Placed This Encounter  Procedures  . Flu Vaccine QUAD 36+ mos IM  . CBC  . Comprehensive metabolic panel  . Lipid panel  . Hemoglobin A1c     Requested Prescriptions   Signed Prescriptions Disp Refills  . predniSONE (DELTASONE) 10 MG tablet 10 tablet 0    Sig: 3 tabs PO x 1 day then 2 tabs PO x 2 days then 1 tab PO x 3 days    Return in about 3 months (around 06/02/2018).  Jonah Blue, MD, FACP

## 2018-03-03 LAB — COMPREHENSIVE METABOLIC PANEL
ALBUMIN: 4.6 g/dL (ref 3.5–5.5)
ALK PHOS: 94 IU/L (ref 39–117)
ALT: 20 IU/L (ref 0–44)
AST: 17 IU/L (ref 0–40)
Albumin/Globulin Ratio: 2 (ref 1.2–2.2)
BUN / CREAT RATIO: 26 — AB (ref 9–20)
BUN: 16 mg/dL (ref 6–24)
Bilirubin Total: 0.3 mg/dL (ref 0.0–1.2)
CO2: 23 mmol/L (ref 20–29)
CREATININE: 0.61 mg/dL — AB (ref 0.76–1.27)
Calcium: 9.9 mg/dL (ref 8.7–10.2)
Chloride: 99 mmol/L (ref 96–106)
GFR calc non Af Amer: 112 mL/min/{1.73_m2} (ref >59)
GFR, EST AFRICAN AMERICAN: 129 mL/min/{1.73_m2} (ref >59)
Globulin, Total: 2.3 g/dL (ref 1.5–4.5)
Glucose: 91 mg/dL (ref 65–99)
Potassium: 4.6 mmol/L (ref 3.5–5.2)
Sodium: 137 mmol/L (ref 134–144)
Total Protein: 6.9 g/dL (ref 6.0–8.5)

## 2018-03-03 LAB — LIPID PANEL
CHOLESTEROL TOTAL: 175 mg/dL (ref 100–199)
Chol/HDL Ratio: 3.2 ratio (ref 0.0–5.0)
HDL: 54 mg/dL (ref >39)
LDL Calculated: 98 mg/dL (ref 0–99)
Triglycerides: 116 mg/dL (ref 0–149)
VLDL Cholesterol Cal: 23 mg/dL (ref 5–40)

## 2018-03-03 LAB — CBC
HEMATOCRIT: 44.1 % (ref 37.5–51.0)
HEMOGLOBIN: 14.5 g/dL (ref 13.0–17.7)
MCH: 30 pg (ref 26.6–33.0)
MCHC: 32.9 g/dL (ref 31.5–35.7)
MCV: 91 fL (ref 79–97)
Platelets: 282 10*3/uL (ref 150–450)
RBC: 4.83 x10E6/uL (ref 4.14–5.80)
RDW: 13.1 % (ref 12.3–15.4)
WBC: 7.1 10*3/uL (ref 3.4–10.8)

## 2018-03-03 LAB — HEMOGLOBIN A1C
Est. average glucose Bld gHb Est-mCnc: 108 mg/dL
HEMOGLOBIN A1C: 5.4 % (ref 4.8–5.6)

## 2018-03-11 MED FILL — LISINOPRIL-HCTZ 20-25 MG TA: 20-25 | 30 days supply | Qty: 30 | Fill #4

## 2018-03-11 MED FILL — ATORVASTATIN 10 MG TABLET: 10 | 30 days supply | Qty: 30 | Fill #4

## 2018-03-11 MED FILL — AMLODIPINE BESYLATE 10 MG T: 10 | 30 days supply | Qty: 30 | Fill #6

## 2018-04-09 MED FILL — LISINOPRIL-HCTZ 20-25 MG TA: 20-25 | 30 days supply | Qty: 30 | Fill #5

## 2018-04-09 MED FILL — AMLODIPINE BESYLATE 10 MG T: 10 | 30 days supply | Qty: 30 | Fill #7

## 2018-04-21 ENCOUNTER — Encounter (INDEPENDENT_AMBULATORY_CARE_PROVIDER_SITE_OTHER): Payer: Self-pay | Admitting: Orthopaedic Surgery

## 2018-04-21 ENCOUNTER — Ambulatory Visit (INDEPENDENT_AMBULATORY_CARE_PROVIDER_SITE_OTHER): Payer: BLUE CROSS/BLUE SHIELD | Admitting: Orthopaedic Surgery

## 2018-04-21 ENCOUNTER — Telehealth (INDEPENDENT_AMBULATORY_CARE_PROVIDER_SITE_OTHER): Payer: Self-pay

## 2018-04-21 DIAGNOSIS — Z96652 Presence of left artificial knee joint: Secondary | ICD-10-CM

## 2018-04-21 NOTE — Progress Notes (Signed)
Office Visit Note   Patient: Michael [name]           Date of Birth: 1960-06-19           MRN: [id number] Visit Date: 04/21/2018              Requested by: Marcine Matar, MD [address] [place], [place] [address] PCP: Marcine Matar, MD   Assessment & Plan: Visit Diagnoses:  1. History of left knee replacement     Plan: Impression is status post left total knee replacement with continued pain and swelling.  Before the visit was over, the patient became frustrated from our conversation and abruptly left the office.  We did try to contact the patient to have him come back in with multiple phone calls but we were unable to speak with him directly.  We have left multiple voicemails.  We would like to order a bone scan at this point.  We will go ahead and proceed with the referral.    Follow-Up Instructions: Return in about 2 weeks (around 05/05/2018) for after bone scan.   Orders:  No orders of the defined types were placed in this encounter.  No orders of the defined types were placed in this encounter.     Procedures: No procedures performed   Clinical Data: No additional findings.   Subjective: Chief Complaint  Patient presents with  . Left Knee - Pain    HPI patient is a 57 year old gentleman who presents to our clinic today with continued pain to the left knee.  He is status post left total knee replacement with [employer] press-fit, date of surgery 02/12/2017.  He was initially doing well until he returned to work at the beginning of 2019.  He does have a very physical job and is on his feet over 8 hours a day.  We were never able to write for desk work or light duty as this was never an option according to the patient.  He has had persistent swelling and most recently increased pain medial side of the knee.  X-rays done in June 2019 were negative for acute findings.  Further imaging to include CT scan in July 2019 were also negative.  The patient has had  continued swelling which she does state has improved after his primary care doctor changing his hypertensive medication and increasing his fluid pill.  He is also wearing compression stockings.  There has not been a new injury or change in activity.  The pain he has is to the medial aspect of his knee worse with walking and at the end of the day after being on his feet at work.  He describes this as a jabbing knife pain.  He has been taking anti-inflammatories with minimal relief of symptoms.  Review of Systems as detailed in HPI.  All others reviewed and are negative.   Objective: Vital Signs: There were no vitals taken for this visit.  Physical Exam well-developed well-nourished gentleman in no acute distress.  Alert and oriented x3 per  Ortho Exam examination of his left knee reveals a 1+ effusion.  Range of motion 0 to 100 degrees.  Stable valgus varus stress.  He does have tenderness to deep palpation medial joint line.  Neurovascularly intact distally.  Specialty Comments:  No specialty comments available.  Imaging: No new imaging   PMFS History: Patient Active Problem List   Diagnosis Date Noted  . Pain in left hip 08/14/2017  . Positive  depression screening 05/22/2017  . History of left knee replacement 02/12/2017  . Hepatitis C virus infection cured after antiviral drug therapy 01/19/2017  . Unilateral primary osteoarthritis, left knee 01/02/2017  . Hyperlipidemia 10/23/2016  . Hypertension 10/18/2016  . Tobacco dependence 10/18/2016  . Chronic pain of left knee 10/18/2016  . 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 Right rotator cuff tear 01/29/2013   Past Medical History:  Diagnosis Date  . Arthritis   . Hepatitis    Hep C rx  . Hypertension     Family History  Problem Relation Age of Onset  . CAD Mother   . Deep vein thrombosis Brother     Past Surgical History:  Procedure Laterality Date  . left knee arthroscopy     . left rotator cuff surgery       . SHOULDER OPEN ROTATOR CUFF REPAIR Right 01/29/2013   Procedure: RIGHT SHOULDER MINI ROTATOR CUFF REPAIR;  Surgeon: Javier DockerJeffrey C Beane, MD;  Location: [facility];  Service: Orthopedics;  Laterality: Right;  With ANCHORS  . TOTAL KNEE ARTHROPLASTY Left 02/12/2017  . TOTAL KNEE ARTHROPLASTY Left 02/12/2017   Procedure: LEFT TOTAL KNEE ARTHROPLASTY;  Surgeon: Tarry KosXu, Naiping M, MD;  Location: [facility];  Service: Orthopedics;  Laterality: Left;   Social History   Occupational History  . Occupation: Chartered certified accountantmachinist  Tobacco Use  . Smoking status: Current Every Day Smoker    Packs/day: 0.50    Years: 37.00    Pack years: 18.50    Types: Cigarettes  . Smokeless tobacco: Never Used  Substance and Sexual Activity  . Alcohol use: Yes    Comment: occ  . Drug use: No    Comment: hx of marijuana and cocaine, LSD  . Sexual activity: Yes    Partners: Female    Birth control/protection: None

## 2018-04-21 NOTE — Telephone Encounter (Signed)
LMOM to return my call.

## 2018-05-07 ENCOUNTER — Encounter (HOSPITAL_COMMUNITY)
Admission: RE | Admit: 2018-05-07 | Discharge: 2018-05-07 | Disposition: A | Discharge status: Home / Self Care | Payer: BLUE CROSS/BLUE SHIELD | Source: Ambulatory Visit | Attending: Orthopaedic Surgery | Admitting: Orthopaedic Surgery

## 2018-05-07 DIAGNOSIS — Z96652 Presence of left artificial knee joint: Secondary | ICD-10-CM | POA: Diagnosis not present

## 2018-05-07 DIAGNOSIS — M25562 Pain in left knee: Secondary | ICD-10-CM | POA: Diagnosis not present

## 2018-05-07 MED ORDER — TECHNETIUM TC 99M MEDRONATE IV KIT
21.8000 | PACK | Freq: Once | INTRAVENOUS | Status: AC | PRN
  Administered 2018-05-07: 21.8 via INTRAVENOUS

## 2018-05-09 ENCOUNTER — Encounter (INDEPENDENT_AMBULATORY_CARE_PROVIDER_SITE_OTHER): Payer: Self-pay | Admitting: Orthopaedic Surgery

## 2018-05-11 MED FILL — LISINOPRIL-HCTZ 20-25 MG TA: 20-25 | 30 days supply | Qty: 30 | Fill #6

## 2018-05-11 MED FILL — AMLODIPINE BESYLATE 10 MG T: 10 | 30 days supply | Qty: 30 | Fill #8

## 2018-05-11 MED FILL — ATORVASTATIN 10 MG TABLET: 10 | 30 days supply | Qty: 30 | Fill #5

## 2018-05-12 ENCOUNTER — Ambulatory Visit (INDEPENDENT_AMBULATORY_CARE_PROVIDER_SITE_OTHER): Payer: BLUE CROSS/BLUE SHIELD | Admitting: Orthopaedic Surgery

## 2018-05-12 DIAGNOSIS — T84033A Mechanical loosening of internal left knee prosthetic joint, initial encounter: Secondary | ICD-10-CM | POA: Insufficient documentation

## 2018-05-12 DIAGNOSIS — Z96652 Presence of left artificial knee joint: Secondary | ICD-10-CM | POA: Diagnosis not present

## 2018-05-12 NOTE — Addendum Note (Signed)
Addended by: Albertina ParrGARCIA, Makaelah Cranfield on: 05/12/2018 08:52 AM   Modules accepted: Orders

## 2018-05-12 NOTE — Progress Notes (Signed)
Office Visit Note   Patient: Michael [name]           Date of Birth: [birth year]           MRN: [id number] Visit Date: 05/12/2018              Requested by: Marcine MatarJohnson, Deborah B, MD [address] [place], [place] [address] PCP: Marcine MatarJohnson, Deborah B, MD   Assessment & Plan: Visit Diagnoses:  1. History of left knee replacement   2. 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  We obtained inflammatory lab work today as well as aspirated his left knee.  I was only able to obtain less than 10 cc of mainly blood-tinged fluid.  This was sent to the lab for analysis.  We will see him back in a week to review the results to discuss further treatment.  Follow-Up Instructions: Return in about 1 week (around 05/19/2018).   Orders:  No orders of the defined types were placed in this encounter.  No orders of the defined types were placed in this encounter.     Procedures: Large Joint Inj: L knee on 05/12/2018 8:28 AM Details: 22 G needle Aspirate: 5 mL bloody; sent for lab analysis Outcome: tolerated well, no immediate complications Patient was prepped and draped in the usual sterile fashion.       Clinical Data: No additional findings.   Subjective: Chief Complaint  Patient presents with  . Left Knee - Pain    Michael [name] comes in to review his bone scan.  He still endorses chronic left knee pain and swelling that is worse with activity.  This has been going on for the last several months.  Denies any constitutional symptoms or injuries.   Review of Systems  Constitutional: Negative.   All other systems reviewed and are negative.    Objective: Vital Signs: There were no vitals taken for this visit.  Physical Exam Vitals signs and nursing note reviewed.  Constitutional:      Appearance: He is well-developed.  Pulmonary:     Effort: Pulmonary  effort is normal.  Abdominal:     Palpations: Abdomen is soft.  Skin:    General: Skin is warm.  Neurological:     Mental Status: He is alert and oriented to person, place, and time.  Psychiatric:        Behavior: Behavior normal.        Thought Content: Thought content normal.        Judgment: Judgment normal.     Ortho Exam Left knee exam shows a small joint effusion.  Fully healed surgical scar.  No evidence of cellulitis. Specialty Comments:  No specialty comments available.  Imaging: No results found.   PMFS History: Patient Active Problem List   Diagnosis Date Noted  . Loose left total knee arthroplasty (HCC) 05/12/2018  . Pain in left hip 08/14/2017  . Positive depression screening 05/22/2017  . History of left knee replacement 02/12/2017  . Hepatitis C virus infection cured after antiviral drug therapy 01/19/2017  . Unilateral primary osteoarthritis, left knee 01/02/2017  . Hyperlipidemia 10/23/2016  . Hypertension 10/18/2016  . Tobacco dependence 10/18/2016  . Chronic pain of left knee 10/18/2016  . 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 Right rotator cuff tear 01/29/2013   Past Medical History:  Diagnosis Date  . Arthritis   .  Hepatitis    Hep C rx  . Hypertension     Family History  Problem Relation Age of Onset  . CAD Mother   . Deep vein thrombosis Brother     Past Surgical History:  Procedure Laterality Date  . left knee arthroscopy     . left rotator cuff surgery     . SHOULDER OPEN ROTATOR CUFF REPAIR Right 01/29/2013   Procedure: RIGHT SHOULDER MINI ROTATOR CUFF REPAIR;  Surgeon: Javier Docker, MD;  Location: [facility];  Service: Orthopedics;  Laterality: Right;  With ANCHORS  . TOTAL KNEE ARTHROPLASTY Left 02/12/2017  . TOTAL KNEE ARTHROPLASTY Left 02/12/2017   Procedure: LEFT TOTAL KNEE ARTHROPLASTY;  Surgeon: Tarry Kos, MD;  Location: [facility];  Service: Orthopedics;  Laterality: Left;   Social History   Occupational History    . Occupation: Chartered certified accountant  Tobacco Use  . Smoking status: Current Every Day Smoker    Packs/day: 0.50    Years: 37.00    Pack years: 18.50    Types: Cigarettes  . Smokeless tobacco: Never Used  Substance and Sexual Activity  . Alcohol use: Yes    Comment: occ  . Drug use: No    Comment: hx of marijuana and cocaine, LSD  . Sexual activity: Yes    Partners: Female    Birth control/protection: None

## 2018-05-12 NOTE — Addendum Note (Signed)
Addended by: Albertina ParrGARCIA, Domitila Stetler on: 05/12/2018 08:53 AM   Modules accepted: Orders

## 2018-05-13 LAB — TIQ-NTM

## 2018-05-13 LAB — SYNOVIAL CELL COUNT + DIFF, W/ CRYSTALS
Basophils, %: 0 %
Eosinophils-Synovial: 4 % — ABNORMAL HIGH (ref 0–2)
LYMPHOCYTES-SYNOVIAL FLD: 20 % (ref 0–74)
Monocyte/Macrophage: 10 % (ref 0–69)
Neutrophil, Synovial: 66 % — ABNORMAL HIGH (ref 0–24)
Synoviocytes, %: 0 % (ref 0–15)
WBC, Synovial: 628 cells/uL — ABNORMAL HIGH (ref <150)

## 2018-05-13 LAB — SEDIMENTATION RATE

## 2018-05-13 LAB — C-REACTIVE PROTEIN: CRP: 8.4 mg/L — ABNORMAL HIGH (ref <8.0)

## 2018-05-14 LAB — CBC WITH DIFFERENTIAL
BASOS ABS: 0 10*3/uL (ref 0.0–0.2)
Basos: 0 %
EOS (ABSOLUTE): 0.2 10*3/uL (ref 0.0–0.4)
Eos: 2 %
Hematocrit: 43.4 % (ref 37.5–51.0)
Hemoglobin: 14.3 g/dL (ref 13.0–17.7)
Immature Grans (Abs): 0 10*3/uL (ref 0.0–0.1)
Immature Granulocytes: 0 %
Lymphocytes Absolute: 1.4 10*3/uL (ref 0.7–3.1)
Lymphs: 14 %
MCH: 29.8 pg (ref 26.6–33.0)
MCHC: 32.9 g/dL (ref 31.5–35.7)
MCV: 90 fL (ref 79–97)
Monocytes Absolute: 0.7 10*3/uL (ref 0.1–0.9)
Monocytes: 7 %
Neutrophils Absolute: 7.2 10*3/uL — ABNORMAL HIGH (ref 1.4–7.0)
Neutrophils: 77 %
RBC: 4.8 x10E6/uL (ref 4.14–5.80)
RDW: 13.1 % (ref 12.3–15.4)
WBC: 9.5 10*3/uL (ref 3.4–10.8)

## 2018-05-22 ENCOUNTER — Ambulatory Visit (INDEPENDENT_AMBULATORY_CARE_PROVIDER_SITE_OTHER): Payer: BLUE CROSS/BLUE SHIELD | Admitting: Orthopaedic Surgery

## 2018-05-22 ENCOUNTER — Encounter (INDEPENDENT_AMBULATORY_CARE_PROVIDER_SITE_OTHER): Payer: Self-pay | Admitting: Orthopaedic Surgery

## 2018-05-22 DIAGNOSIS — T84033A Mechanical loosening of internal left knee prosthetic joint, initial encounter: Secondary | ICD-10-CM

## 2018-05-22 NOTE — Progress Notes (Signed)
Office Visit Note   Patient: Michael MinerScott D [name]           Date of Birth: 19-Mar-1961           MRN: [id number] Visit Date: 05/22/2018              Requested by: Marcine MatarJohnson, Deborah B, MD [address] [place], [place] [address] PCP: Marcine MatarJohnson, Deborah B, MD   Assessment & Plan: Visit Diagnoses:  1. 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  His CRP is minimally elevated and his knee aspiration revealed a white blood cell count of 628.  His peripheral white blood cell count is normal.  At this point we discussed revision of his tibial implant with a cemented one.  We will certainly take frozens during surgery to rule out occult infection.  Risks and benefits and details of the surgery including risk rehab and recovery were discussed.  Patient will need to be out of work for anticipated 2 to 3 months from this revision surgery.  Questions encouraged and answered.  We will get him scheduled in the near future.  Follow-Up Instructions: No follow-ups on file.   Orders:  No orders of the defined types were placed in this encounter.  No orders of the defined types were placed in this encounter.     Procedures: No procedures performed   Clinical Data: No additional findings.   Subjective: Chief Complaint  Patient presents with  . Left Knee - Pain    Michael PicketScott returns today for follow-up of his infection work-up for his prosthetic left total knee replacement.  He denies any changes in medical history.   Review of Systems   Objective: Vital Signs: There were no vitals taken for this visit.  Physical Exam  Ortho Exam Left knee exam is stable.  He does not have an effusion. Specialty Comments:  No specialty comments available.  Imaging: No results found.   PMFS History: Patient Active Problem List   Diagnosis Date Noted  . Loose left total  knee arthroplasty (HCC) 05/12/2018  . Pain in left hip 08/14/2017  . Positive depression screening 05/22/2017  . History of left knee replacement 02/12/2017  . Hepatitis C virus infection cured after antiviral drug therapy 01/19/2017  . Unilateral primary osteoarthritis, left knee 01/02/2017  . Hyperlipidemia 10/23/2016  . Hypertension 10/18/2016  . Tobacco dependence 10/18/2016  . Chronic pain of left knee 10/18/2016  . 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 Right rotator cuff tear 01/29/2013   Past Medical History:  Diagnosis Date  . Arthritis   . Hepatitis    Hep C rx  . Hypertension     Family History  Problem Relation Age of Onset  . CAD Mother   . Deep vein thrombosis Brother     Past Surgical History:  Procedure Laterality Date  . left knee arthroscopy     . left rotator cuff surgery     . SHOULDER OPEN ROTATOR CUFF REPAIR Right 01/29/2013   Procedure: RIGHT SHOULDER MINI ROTATOR CUFF REPAIR;  Surgeon: Javier DockerJeffrey C Beane, MD;  Location: [facility];  Service: Orthopedics;  Laterality: Right;  With ANCHORS  . TOTAL KNEE ARTHROPLASTY Left 02/12/2017  . TOTAL KNEE ARTHROPLASTY Left 02/12/2017   Procedure: LEFT TOTAL KNEE ARTHROPLASTY;  Surgeon: Tarry KosXu, Kayli Beal M, MD;  Location: [facility];  Service: Orthopedics;  Laterality: Left;  Social History   Occupational History  . Occupation: Chartered certified accountantmachinist  Tobacco Use  . Smoking status: Current Every Day Smoker    Packs/day: 0.50    Years: 37.00    Pack years: 18.50    Types: Cigarettes  . Smokeless tobacco: Never Used  Substance and Sexual Activity  . Alcohol use: Yes    Comment: occ  . Drug use: No    Comment: hx of marijuana and cocaine, LSD  . Sexual activity: Yes    Partners: Female    Birth control/protection: None

## 2018-06-02 ENCOUNTER — Ambulatory Visit: Payer: Self-pay | Admitting: Internal Medicine

## 2018-06-07 ENCOUNTER — Encounter (INDEPENDENT_AMBULATORY_CARE_PROVIDER_SITE_OTHER): Payer: Self-pay | Admitting: Orthopaedic Surgery

## 2018-06-08 ENCOUNTER — Other Ambulatory Visit: Payer: Self-pay | Admitting: Internal Medicine

## 2018-06-08 DIAGNOSIS — I10 Essential (primary) hypertension: Secondary | ICD-10-CM

## 2018-06-08 MED FILL — ATORVASTATIN 10 MG TABLET: 10 | 30 days supply | Qty: 30 | Fill #6

## 2018-06-10 MED FILL — LISINOPRIL-HCTZ 20-25 MG TA: 20-25 | 30 days supply | Qty: 30 | Fill #0

## 2018-06-10 MED FILL — AMLODIPINE BESYLATE 10 MG T: 10 | 30 days supply | Qty: 30 | Fill #0

## 2018-06-10 NOTE — Pre-Procedure Instructions (Signed)
CHUN DANEY  06/10/2018      [id number] [address] [place] [id number] Phone: [phone] Fax: [phone]  [facility] - [place], [place] - [place] [address] [place] [address] Phone: [phone] Fax: [phone]    Your procedure is scheduled on Monday January 20th.  Report to [facility] Admitting at 1100 A.M.  Call this number if you have problems the morning of surgery:  [phone]   Remember:  Do not eat or drink after midnight.    Take these medicines the morning of surgery with A SIP OF WATER  amLODipine (NORVASC)  atorvastatin (LIPITOR)  methadone (DOLOPHINE)  7 days prior to surgery STOP taking any meloxicam (MOBIC), Aspirin (unless otherwise instructed by your surgeon), Aleve, Naproxen, Ibuprofen, Motrin, Advil, Goody's, BC's, all herbal medications, fish oil, and all vitamins.    Do not wear jewelry.  Do not wear lotions, powders, or colognes, or deodorant.  Men may shave face and neck.  Do not bring valuables to the hospital.  [facility] is not responsible for any belongings or valuables.  Contacts, dentures or bridgework may not be worn into surgery.  Leave your suitcase in the car.  After surgery it may be brought to your room.  For patients admitted to the hospital, discharge time will be determined by your treatment team.  Patients discharged the day of surgery will not be allowed to drive home.    [facility]- Preparing For Surgery  Before surgery, you can play an important role. Because skin is not sterile, your skin needs to be as free of germs as possible. You can reduce the number of germs on your skin by washing with CHG (chlorahexidine gluconate) Soap before surgery.  CHG is an antiseptic cleaner which kills germs and bonds with the skin to continue killing germs even after washing.    Oral Hygiene is also important to reduce  your risk of infection.  Remember - BRUSH YOUR TEETH THE MORNING OF SURGERY WITH YOUR REGULAR TOOTHPASTE  Please do not use if you have an allergy to CHG or antibacterial soaps. If your skin becomes reddened/irritated stop using the CHG.  Do not shave (including legs and underarms) for at least 48 hours prior to first CHG shower. It is OK to shave your face.  Please follow these instructions carefully.   1. Shower the NIGHT BEFORE SURGERY and the MORNING OF SURGERY with CHG.   2. If you chose to wash your hair, wash your hair first as usual with your normal shampoo.  3. After you shampoo, rinse your hair and body thoroughly to remove the shampoo.  4. Use CHG as you would any other liquid soap. You can apply CHG directly to the skin and wash gently with a scrungie or a clean washcloth.   5. Apply the CHG Soap to your body ONLY FROM THE NECK DOWN.  Do not use on open wounds or open sores. Avoid contact with your eyes, ears, mouth and genitals (private parts). Wash Face and genitals (private parts)  with your normal soap.  6. Wash thoroughly, paying special attention to the area where your surgery will be performed.  7. Thoroughly rinse your body with warm water from the neck down.  8. DO NOT shower/wash with your normal soap after using and rinsing off the CHG Soap.  9. Pat yourself dry with a CLEAN TOWEL.  10.  Wear CLEAN PAJAMAS to bed the night before surgery, wear comfortable clothes the morning of surgery  11. Place CLEAN SHEETS on your bed the night of your first shower and DO NOT SLEEP WITH PETS.    Day of Surgery:  Do not apply any deodorants/lotions.  Please wear clean clothes to the hospital/surgery center.   Remember to brush your teeth WITH YOUR REGULAR TOOTHPASTE.    Please read over the following fact sheets that you were given.

## 2018-06-11 ENCOUNTER — Other Ambulatory Visit: Payer: Self-pay

## 2018-06-11 ENCOUNTER — Encounter (HOSPITAL_COMMUNITY): Payer: Self-pay

## 2018-06-11 ENCOUNTER — Encounter (HOSPITAL_COMMUNITY)
Admission: RE | Admit: 2018-06-11 | Discharge: 2018-06-11 | Disposition: A | Discharge status: Home / Self Care | Payer: BLUE CROSS/BLUE SHIELD | Source: Ambulatory Visit | Attending: Orthopaedic Surgery | Admitting: Orthopaedic Surgery

## 2018-06-11 DIAGNOSIS — I10 Essential (primary) hypertension: Secondary | ICD-10-CM | POA: Diagnosis not present

## 2018-06-11 DIAGNOSIS — Z01818 Encounter for other preprocedural examination: Secondary | ICD-10-CM | POA: Insufficient documentation

## 2018-06-11 LAB — PROTIME-INR
INR: 0.93
Prothrombin Time: 12.4 seconds (ref 11.4–15.2)

## 2018-06-11 LAB — CBC WITH DIFFERENTIAL/PLATELET
Abs Immature Granulocytes: 0.05 10*3/uL (ref 0.00–0.07)
Basophils Absolute: 0.1 10*3/uL (ref 0.0–0.1)
Basophils Relative: 1 %
EOS ABS: 0.2 10*3/uL (ref 0.0–0.5)
Eosinophils Relative: 3 %
HCT: 41.9 % (ref 39.0–52.0)
Hemoglobin: 14 g/dL (ref 13.0–17.0)
Immature Granulocytes: 1 %
Lymphocytes Relative: 20 %
Lymphs Abs: 1.7 10*3/uL (ref 0.7–4.0)
MCH: 30.4 pg (ref 26.0–34.0)
MCHC: 33.4 g/dL (ref 30.0–36.0)
MCV: 90.9 fL (ref 80.0–100.0)
MONO ABS: 0.7 10*3/uL (ref 0.1–1.0)
Monocytes Relative: 8 %
Neutro Abs: 6 10*3/uL (ref 1.7–7.7)
Neutrophils Relative %: 67 %
Platelets: 295 10*3/uL (ref 150–400)
RBC: 4.61 MIL/uL (ref 4.22–5.81)
RDW: 13 % (ref 11.5–15.5)
WBC: 8.7 10*3/uL (ref 4.0–10.5)
nRBC: 0 % (ref 0.0–0.2)

## 2018-06-11 LAB — COMPREHENSIVE METABOLIC PANEL
ALT: 21 U/L (ref 0–44)
AST: 30 U/L (ref 15–41)
Albumin: 3.7 g/dL (ref 3.5–5.0)
Alkaline Phosphatase: 76 U/L (ref 38–126)
Anion gap: 9 (ref 5–15)
BUN: 17 mg/dL (ref 6–20)
CO2: 26 mmol/L (ref 22–32)
Calcium: 9.1 mg/dL (ref 8.9–10.3)
Chloride: 103 mmol/L (ref 98–111)
Creatinine, Ser: 0.95 mg/dL (ref 0.61–1.24)
GFR calc Af Amer: >60 mL/min (ref >60)
GFR calc non Af Amer: >60 mL/min (ref >60)
Glucose, Bld: 84 mg/dL (ref 70–99)
Potassium: 4.6 mmol/L (ref 3.5–5.1)
Sodium: 138 mmol/L (ref 135–145)
TOTAL PROTEIN: 6.8 g/dL (ref 6.5–8.1)
Total Bilirubin: 1.2 mg/dL (ref 0.3–1.2)

## 2018-06-11 LAB — APTT: aPTT: 33 seconds (ref 24–36)

## 2018-06-11 LAB — TYPE AND SCREEN
ABO/RH(D): B POS
ANTIBODY SCREEN: NEGATIVE

## 2018-06-11 LAB — SURGICAL PCR SCREEN
MRSA, PCR: NEGATIVE
Staphylococcus aureus: NEGATIVE

## 2018-06-11 NOTE — Progress Notes (Signed)
   06/11/18 0844  OBSTRUCTIVE SLEEP APNEA  Have you ever been diagnosed with sleep apnea through a sleep study? No  Do you snore loudly (loud enough to be heard through closed doors)?  0  Do you often feel tired, fatigued, or sleepy during the daytime (such as falling asleep during driving or talking to someone)? 0  Has anyone observed you stop breathing during your sleep? 0  Do you have, or are you being treated for high blood pressure? 1  BMI more than 35 kg/m2? 1  Age > 50 (1-yes) 1  Neck circumference greater than:Male 16 inches or larger, Male 17inches or larger? 1  Male Gender (Yes=1) 1  Obstructive Sleep Apnea Score 5  Score 5 or greater  Results sent to PCP

## 2018-06-11 NOTE — Progress Notes (Signed)
PCP - Jonah Blue MD Chest x-ray - N/A EKG - 06/11/2018  Blood Thinner Instructions: N/A Aspirin Instructions: N/A  Anesthesia review: N/A  Patient denies shortness of breath, fever, cough and chest pain at PAT appointment   Patient verbalized understanding of instructions that were given to them at the PAT appointment. Patient was also instructed that they will need to review over the PAT instructions again at home before surgery.

## 2018-06-12 MED ORDER — TRANEXAMIC ACID 1000 MG/10ML IV SOLN
2000.0000 mg | INTRAVENOUS | Status: DC
  Filled 2018-06-12: qty 20

## 2018-06-15 ENCOUNTER — Inpatient Hospital Stay (HOSPITAL_COMMUNITY): Payer: BLUE CROSS/BLUE SHIELD | Admitting: Anesthesiology

## 2018-06-15 ENCOUNTER — Encounter (HOSPITAL_COMMUNITY)
Admission: RE | Disposition: A | Discharge status: Home Health | Payer: Self-pay | Source: Home / Self Care | Attending: Orthopaedic Surgery

## 2018-06-15 ENCOUNTER — Encounter (HOSPITAL_COMMUNITY): Payer: Self-pay

## 2018-06-15 ENCOUNTER — Inpatient Hospital Stay (HOSPITAL_COMMUNITY): Payer: BLUE CROSS/BLUE SHIELD

## 2018-06-15 ENCOUNTER — Inpatient Hospital Stay (HOSPITAL_COMMUNITY)
Admission: RE | Admit: 2018-06-15 | Discharge: 2018-06-16 | DRG: 488 | Disposition: A | Discharge status: Home Health | Payer: BLUE CROSS/BLUE SHIELD | Attending: Orthopaedic Surgery | Admitting: Orthopaedic Surgery

## 2018-06-15 ENCOUNTER — Other Ambulatory Visit: Payer: Self-pay

## 2018-06-15 DIAGNOSIS — T8484XA Pain due to internal orthopedic prosthetic devices, implants and grafts, initial encounter: Secondary | ICD-10-CM | POA: Diagnosis not present

## 2018-06-15 DIAGNOSIS — Z791 Long term (current) use of non-steroidal anti-inflammatories (NSAID): Secondary | ICD-10-CM

## 2018-06-15 DIAGNOSIS — G8918 Other acute postprocedural pain: Secondary | ICD-10-CM | POA: Diagnosis not present

## 2018-06-15 DIAGNOSIS — Z96652 Presence of left artificial knee joint: Secondary | ICD-10-CM

## 2018-06-15 DIAGNOSIS — E785 Hyperlipidemia, unspecified: Secondary | ICD-10-CM | POA: Diagnosis not present

## 2018-06-15 DIAGNOSIS — Z79899 Other long term (current) drug therapy: Secondary | ICD-10-CM

## 2018-06-15 DIAGNOSIS — Y792 Prosthetic and other implants, materials and accessory orthopedic devices associated with adverse incidents: Secondary | ICD-10-CM | POA: Diagnosis not present

## 2018-06-15 DIAGNOSIS — T84033A Mechanical loosening of internal left knee prosthetic joint, initial encounter: Principal | ICD-10-CM | POA: Diagnosis present

## 2018-06-15 DIAGNOSIS — I10 Essential (primary) hypertension: Secondary | ICD-10-CM | POA: Diagnosis not present

## 2018-06-15 DIAGNOSIS — D62 Acute posthemorrhagic anemia: Secondary | ICD-10-CM | POA: Diagnosis not present

## 2018-06-15 DIAGNOSIS — F17210 Nicotine dependence, cigarettes, uncomplicated: Secondary | ICD-10-CM | POA: Diagnosis present

## 2018-06-15 DIAGNOSIS — Z7982 Long term (current) use of aspirin: Secondary | ICD-10-CM

## 2018-06-15 DIAGNOSIS — B1920 Unspecified viral hepatitis C without hepatic coma: Secondary | ICD-10-CM | POA: Diagnosis not present

## 2018-06-15 DIAGNOSIS — M6588 Other synovitis and tenosynovitis, other site: Secondary | ICD-10-CM | POA: Diagnosis not present

## 2018-06-15 DIAGNOSIS — Z471 Aftercare following joint replacement surgery: Secondary | ICD-10-CM | POA: Diagnosis not present

## 2018-06-15 HISTORY — PX: REVISION TOTAL KNEE ARTHROPLASTY: SUR1280

## 2018-06-15 HISTORY — PX: TOTAL KNEE REVISION: SHX996

## 2018-06-15 SURGERY — TOTAL KNEE REVISION
Anesthesia: Spinal | Site: Knee | Laterality: Left

## 2018-06-15 MED ORDER — OXYCODONE HCL ER 10 MG PO T12A
10.0000 mg | EXTENDED_RELEASE_TABLET | Freq: Two times a day (BID) | ORAL | Status: DC
  Administered 2018-06-15 – 2018-06-16 (×2): 10 mg via ORAL
  Filled 2018-06-15 (×2): qty 1

## 2018-06-15 MED ORDER — LACTATED RINGERS IV SOLN
INTRAVENOUS | Status: DC
  Administered 2018-06-15 (×2): via INTRAVENOUS

## 2018-06-15 MED ORDER — LISINOPRIL 20 MG PO TABS
20.0000 mg | ORAL_TABLET | Freq: Every day | ORAL | Status: DC
  Filled 2018-06-15: qty 1

## 2018-06-15 MED ORDER — HYDROCHLOROTHIAZIDE 25 MG PO TABS
25.0000 mg | ORAL_TABLET | Freq: Every day | ORAL | Status: DC
  Administered 2018-06-16: 25 mg via ORAL
  Filled 2018-06-15: qty 1

## 2018-06-15 MED ORDER — ONDANSETRON HCL 4 MG PO TABS: 4.0000 mg | ORAL_TABLET | Freq: Three times a day (TID) | ORAL | 0 refills | Status: DC | PRN

## 2018-06-15 MED ORDER — MIDAZOLAM HCL 2 MG/2ML IJ SOLN
INTRAMUSCULAR | Status: AC
  Administered 2018-06-15: 2 mg via INTRAVENOUS
  Filled 2018-06-15: qty 2

## 2018-06-15 MED ORDER — ACETAMINOPHEN 325 MG PO TABS: 325.0000 mg | ORAL_TABLET | Freq: Four times a day (QID) | ORAL | Status: DC | PRN

## 2018-06-15 MED ORDER — CLONIDINE HCL (ANALGESIA) 100 MCG/ML EP SOLN
EPIDURAL | Status: DC | PRN
  Administered 2018-06-15: 100 ug

## 2018-06-15 MED ORDER — 0.9 % SODIUM CHLORIDE (POUR BTL) OPTIME
TOPICAL | Status: DC | PRN
  Administered 2018-06-15: 1000 mL

## 2018-06-15 MED ORDER — HYDROMORPHONE HCL 1 MG/ML IJ SOLN
0.5000 mg | INTRAMUSCULAR | Status: DC | PRN
  Administered 2018-06-15 – 2018-06-16 (×2): 1 mg via INTRAVENOUS
  Filled 2018-06-15 (×2): qty 1

## 2018-06-15 MED ORDER — LISINOPRIL-HYDROCHLOROTHIAZIDE 20-25 MG PO TABS: 1.0000 | ORAL_TABLET | Freq: Every day | ORAL | Status: DC

## 2018-06-15 MED ORDER — ACETAMINOPHEN 325 MG PO TABS: 325.0000 mg | ORAL_TABLET | ORAL | Status: DC | PRN

## 2018-06-15 MED ORDER — KETOROLAC TROMETHAMINE 15 MG/ML IJ SOLN
30.0000 mg | Freq: Four times a day (QID) | INTRAMUSCULAR | Status: DC
  Administered 2018-06-15 – 2018-06-16 (×3): 30 mg via INTRAVENOUS
  Filled 2018-06-15 (×3): qty 2

## 2018-06-15 MED ORDER — OXYCODONE HCL 5 MG/5ML PO SOLN: 5.0000 mg | Freq: Once | ORAL | Status: DC | PRN

## 2018-06-15 MED ORDER — EPHEDRINE SULFATE 50 MG/ML IJ SOLN
INTRAMUSCULAR | Status: DC | PRN
  Administered 2018-06-15: 15 mg via INTRAVENOUS
  Administered 2018-06-15: 30 mg via INTRAVENOUS

## 2018-06-15 MED ORDER — DEXAMETHASONE SODIUM PHOSPHATE 10 MG/ML IJ SOLN
10.0000 mg | Freq: Once | INTRAMUSCULAR | Status: AC
  Administered 2018-06-16: 10 mg via INTRAVENOUS
  Filled 2018-06-15: qty 1

## 2018-06-15 MED ORDER — VANCOMYCIN HCL 1000 MG IV SOLR
INTRAVENOUS | Status: DC | PRN
  Administered 2018-06-15: 1000 mg via TOPICAL

## 2018-06-15 MED ORDER — TRANEXAMIC ACID-NACL 1000-0.7 MG/100ML-% IV SOLN
1000.0000 mg | INTRAVENOUS | Status: AC
  Administered 2018-06-15: 1000 mg via INTRAVENOUS
  Filled 2018-06-15: qty 100

## 2018-06-15 MED ORDER — FENTANYL CITRATE (PF) 100 MCG/2ML IJ SOLN
100.0000 ug | Freq: Once | INTRAMUSCULAR | Status: AC
  Administered 2018-06-15: 100 ug via INTRAVENOUS

## 2018-06-15 MED ORDER — MAGNESIUM CITRATE PO SOLN: 1.0000 | Freq: Once | ORAL | Status: DC | PRN

## 2018-06-15 MED ORDER — IPRATROPIUM-ALBUTEROL 0.5-2.5 (3) MG/3ML IN SOLN
RESPIRATORY_TRACT | Status: AC
  Filled 2018-06-15: qty 3

## 2018-06-15 MED ORDER — SODIUM CHLORIDE 0.9% FLUSH
INTRAVENOUS | Status: DC | PRN
  Administered 2018-06-15: 40 mL

## 2018-06-15 MED ORDER — BUPIVACAINE IN DEXTROSE 0.75-8.25 % IT SOLN
INTRATHECAL | Status: DC | PRN
  Administered 2018-06-15: 2 mL via INTRATHECAL

## 2018-06-15 MED ORDER — ATORVASTATIN CALCIUM 10 MG PO TABS
10.0000 mg | ORAL_TABLET | Freq: Every day | ORAL | Status: DC
  Administered 2018-06-16: 10 mg via ORAL
  Filled 2018-06-15: qty 1

## 2018-06-15 MED ORDER — SENNOSIDES-DOCUSATE SODIUM 8.6-50 MG PO TABS: 1.0000 | ORAL_TABLET | Freq: Every evening | ORAL | 1 refills | Status: DC | PRN

## 2018-06-15 MED ORDER — FENTANYL CITRATE (PF) 100 MCG/2ML IJ SOLN: 25.0000 ug | INTRAMUSCULAR | Status: DC | PRN

## 2018-06-15 MED ORDER — GABAPENTIN 300 MG PO CAPS
300.0000 mg | ORAL_CAPSULE | Freq: Three times a day (TID) | ORAL | Status: DC
  Administered 2018-06-15 – 2018-06-16 (×3): 300 mg via ORAL
  Filled 2018-06-15 (×3): qty 1

## 2018-06-15 MED ORDER — DOCUSATE SODIUM 100 MG PO CAPS
100.0000 mg | ORAL_CAPSULE | Freq: Two times a day (BID) | ORAL | Status: DC
  Administered 2018-06-15 – 2018-06-16 (×2): 100 mg via ORAL
  Filled 2018-06-15 (×2): qty 1

## 2018-06-15 MED ORDER — ALBUTEROL SULFATE HFA 108 (90 BASE) MCG/ACT IN AERS
INHALATION_SPRAY | RESPIRATORY_TRACT | Status: AC
  Filled 2018-06-15: qty 6.7

## 2018-06-15 MED ORDER — ACETAMINOPHEN 160 MG/5ML PO SOLN: 325.0000 mg | ORAL | Status: DC | PRN

## 2018-06-15 MED ORDER — METHOCARBAMOL 1000 MG/10ML IJ SOLN
500.0000 mg | Freq: Four times a day (QID) | INTRAVENOUS | Status: DC | PRN
  Filled 2018-06-15: qty 5

## 2018-06-15 MED ORDER — METHADONE HCL 10 MG PO TABS
50.0000 mg | ORAL_TABLET | Freq: Every day | ORAL | Status: DC
  Administered 2018-06-16: 50 mg via ORAL
  Filled 2018-06-15: qty 5

## 2018-06-15 MED ORDER — ONDANSETRON HCL 4 MG/2ML IJ SOLN: 4.0000 mg | Freq: Four times a day (QID) | INTRAMUSCULAR | Status: DC | PRN

## 2018-06-15 MED ORDER — VANCOMYCIN HCL IN DEXTROSE 1-5 GM/200ML-% IV SOLN
INTRAVENOUS | Status: AC
  Filled 2018-06-15: qty 200

## 2018-06-15 MED ORDER — VANCOMYCIN HCL 1000 MG IV SOLR
INTRAVENOUS | Status: AC
  Filled 2018-06-15: qty 1000

## 2018-06-15 MED ORDER — FENTANYL CITRATE (PF) 100 MCG/2ML IJ SOLN
INTRAMUSCULAR | Status: AC
  Administered 2018-06-15: 100 ug via INTRAVENOUS
  Filled 2018-06-15: qty 2

## 2018-06-15 MED ORDER — SODIUM CHLORIDE 0.9 % IR SOLN
Status: DC | PRN
  Administered 2018-06-15: 3000 mL

## 2018-06-15 MED ORDER — MEPERIDINE HCL 50 MG/ML IJ SOLN: 6.2500 mg | INTRAMUSCULAR | Status: DC | PRN

## 2018-06-15 MED ORDER — OXYCODONE HCL 5 MG PO TABS: 5.0000 mg | ORAL_TABLET | Freq: Once | ORAL | Status: DC | PRN

## 2018-06-15 MED ORDER — SORBITOL 70 % SOLN: 30.0000 mL | Freq: Every day | Status: DC | PRN

## 2018-06-15 MED ORDER — OXYCODONE HCL 5 MG PO TABS
10.0000 mg | ORAL_TABLET | ORAL | Status: DC | PRN
  Administered 2018-06-16: 15 mg via ORAL
  Filled 2018-06-15: qty 3

## 2018-06-15 MED ORDER — ASPIRIN EC 325 MG PO TBEC
325.0000 mg | DELAYED_RELEASE_TABLET | Freq: Two times a day (BID) | ORAL | Status: DC
  Administered 2018-06-15 – 2018-06-16 (×2): 325 mg via ORAL
  Filled 2018-06-15 (×2): qty 1

## 2018-06-15 MED ORDER — TRANEXAMIC ACID-NACL 1000-0.7 MG/100ML-% IV SOLN
INTRAVENOUS | Status: AC | PRN
  Administered 2018-06-15: 2000 mg via INTRAVENOUS

## 2018-06-15 MED ORDER — OXYCODONE HCL 5 MG PO TABS: 5.0000 mg | ORAL_TABLET | ORAL | 0 refills | Status: DC | PRN

## 2018-06-15 MED ORDER — TRANEXAMIC ACID-NACL 1000-0.7 MG/100ML-% IV SOLN
1000.0000 mg | Freq: Once | INTRAVENOUS | Status: AC
  Administered 2018-06-15: 1000 mg via INTRAVENOUS
  Filled 2018-06-15: qty 100

## 2018-06-15 MED ORDER — LACTATED RINGERS IV SOLN: INTRAVENOUS | Status: DC

## 2018-06-15 MED ORDER — PROPOFOL 10 MG/ML IV BOLUS
INTRAVENOUS | Status: DC | PRN
  Administered 2018-06-15: 20 mg via INTRAVENOUS

## 2018-06-15 MED ORDER — CHLORHEXIDINE GLUCONATE 4 % EX LIQD: 60.0000 mL | Freq: Once | CUTANEOUS | Status: DC

## 2018-06-15 MED ORDER — CEFAZOLIN SODIUM 1 G IJ SOLR
INTRAMUSCULAR | Status: AC
  Filled 2018-06-15: qty 20

## 2018-06-15 MED ORDER — CEFAZOLIN SODIUM-DEXTROSE 2-3 GM-%(50ML) IV SOLR
INTRAVENOUS | Status: DC | PRN
  Administered 2018-06-15: 2 g via INTRAVENOUS

## 2018-06-15 MED ORDER — AMLODIPINE BESYLATE 10 MG PO TABS
10.0000 mg | ORAL_TABLET | Freq: Every day | ORAL | Status: DC
  Administered 2018-06-16: 10 mg via ORAL
  Filled 2018-06-15: qty 1

## 2018-06-15 MED ORDER — PROPOFOL 500 MG/50ML IV EMUL
INTRAVENOUS | Status: DC | PRN
  Administered 2018-06-15: 100 ug/kg/min via INTRAVENOUS

## 2018-06-15 MED ORDER — ALUM & MAG HYDROXIDE-SIMETH 200-200-20 MG/5ML PO SUSP: 30.0000 mL | ORAL | Status: DC | PRN

## 2018-06-15 MED ORDER — PROPOFOL 10 MG/ML IV BOLUS
INTRAVENOUS | Status: AC
  Filled 2018-06-15: qty 20

## 2018-06-15 MED ORDER — ONDANSETRON HCL 4 MG/2ML IJ SOLN: 4.0000 mg | Freq: Once | INTRAMUSCULAR | Status: DC | PRN

## 2018-06-15 MED ORDER — OXYCODONE HCL ER 10 MG PO T12A: 10.0000 mg | EXTENDED_RELEASE_TABLET | Freq: Two times a day (BID) | ORAL | 0 refills | Status: AC

## 2018-06-15 MED ORDER — IPRATROPIUM-ALBUTEROL 20-100 MCG/ACT IN AERS
INHALATION_SPRAY | RESPIRATORY_TRACT | Status: DC | PRN
  Administered 2018-06-15: 1 via RESPIRATORY_TRACT

## 2018-06-15 MED ORDER — SODIUM CHLORIDE 0.9 % IV SOLN
INTRAVENOUS | Status: DC
  Administered 2018-06-15 – 2018-06-16 (×2): via INTRAVENOUS

## 2018-06-15 MED ORDER — METHOCARBAMOL 500 MG PO TABS
500.0000 mg | ORAL_TABLET | Freq: Four times a day (QID) | ORAL | Status: DC | PRN
  Administered 2018-06-15 – 2018-06-16 (×2): 500 mg via ORAL
  Filled 2018-06-15 (×2): qty 1

## 2018-06-15 MED ORDER — MIDAZOLAM HCL 2 MG/2ML IJ SOLN
INTRAMUSCULAR | Status: AC
  Filled 2018-06-15: qty 2

## 2018-06-15 MED ORDER — METOCLOPRAMIDE HCL 5 MG/ML IJ SOLN: 5.0000 mg | Freq: Three times a day (TID) | INTRAMUSCULAR | Status: DC | PRN

## 2018-06-15 MED ORDER — POLYETHYLENE GLYCOL 3350 17 G PO PACK: 17.0000 g | PACK | Freq: Every day | ORAL | Status: DC | PRN

## 2018-06-15 MED ORDER — PHENOL 1.4 % MT LIQD: 1.0000 | OROMUCOSAL | Status: DC | PRN

## 2018-06-15 MED ORDER — OXYCODONE HCL 5 MG PO TABS: 5.0000 mg | ORAL_TABLET | ORAL | Status: DC | PRN

## 2018-06-15 MED ORDER — FENTANYL CITRATE (PF) 250 MCG/5ML IJ SOLN
INTRAMUSCULAR | Status: AC
  Filled 2018-06-15: qty 5

## 2018-06-15 MED ORDER — PROMETHAZINE HCL 25 MG PO TABS: 25.0000 mg | ORAL_TABLET | Freq: Four times a day (QID) | ORAL | 1 refills | Status: DC | PRN

## 2018-06-15 MED ORDER — METHOCARBAMOL 750 MG PO TABS: 750.0000 mg | ORAL_TABLET | Freq: Two times a day (BID) | ORAL | 0 refills | Status: DC | PRN

## 2018-06-15 MED ORDER — CEFAZOLIN SODIUM-DEXTROSE 2-4 GM/100ML-% IV SOLN
2.0000 g | Freq: Four times a day (QID) | INTRAVENOUS | Status: AC
  Administered 2018-06-15 – 2018-06-16 (×3): 2 g via INTRAVENOUS
  Filled 2018-06-15 (×3): qty 100

## 2018-06-15 MED ORDER — VANCOMYCIN HCL IN DEXTROSE 1-5 GM/200ML-% IV SOLN
1000.0000 mg | INTRAVENOUS | Status: AC
  Administered 2018-06-15: 1000 mg via INTRAVENOUS
  Filled 2018-06-15: qty 200

## 2018-06-15 MED ORDER — BUPIVACAINE LIPOSOME 1.3 % IJ SUSP
20.0000 mL | INTRAMUSCULAR | Status: AC
  Administered 2018-06-15: 20 mL
  Filled 2018-06-15: qty 20

## 2018-06-15 MED ORDER — METOCLOPRAMIDE HCL 5 MG PO TABS: 5.0000 mg | ORAL_TABLET | Freq: Three times a day (TID) | ORAL | Status: DC | PRN

## 2018-06-15 MED ORDER — DIPHENHYDRAMINE HCL 12.5 MG/5ML PO ELIX: 25.0000 mg | ORAL_SOLUTION | ORAL | Status: DC | PRN

## 2018-06-15 MED ORDER — MENTHOL 3 MG MT LOZG: 1.0000 | LOZENGE | OROMUCOSAL | Status: DC | PRN

## 2018-06-15 MED ORDER — ROPIVACAINE HCL 7.5 MG/ML IJ SOLN
INTRAMUSCULAR | Status: DC | PRN
  Administered 2018-06-15: 30 mL via PERINEURAL

## 2018-06-15 MED ORDER — ASPIRIN EC 325 MG PO TBEC: 81.0000 mg | DELAYED_RELEASE_TABLET | Freq: Two times a day (BID) | ORAL | 0 refills | Status: DC

## 2018-06-15 MED ORDER — MIDAZOLAM HCL 2 MG/2ML IJ SOLN
2.0000 mg | Freq: Once | INTRAMUSCULAR | Status: AC
  Administered 2018-06-15: 2 mg via INTRAVENOUS

## 2018-06-15 MED ORDER — CELECOXIB 200 MG PO CAPS
200.0000 mg | ORAL_CAPSULE | Freq: Two times a day (BID) | ORAL | Status: DC
  Administered 2018-06-15 – 2018-06-16 (×2): 200 mg via ORAL
  Filled 2018-06-15 (×2): qty 1

## 2018-06-15 MED ORDER — ONDANSETRON HCL 4 MG PO TABS: 4.0000 mg | ORAL_TABLET | Freq: Four times a day (QID) | ORAL | Status: DC | PRN

## 2018-06-15 MED ORDER — ACETAMINOPHEN 500 MG PO TABS
1000.0000 mg | ORAL_TABLET | Freq: Four times a day (QID) | ORAL | Status: DC
  Administered 2018-06-15 – 2018-06-16 (×3): 1000 mg via ORAL
  Filled 2018-06-15 (×3): qty 2

## 2018-06-15 SURGICAL SUPPLY — 70 items
ARTISURF 11M VE L 6-9 EF KNEE (Knees) ×2 IMPLANT
BANDAGE ESMARK 6X9 LF (GAUZE/BANDAGES/DRESSINGS) IMPLANT
BLADE CLIPPER SURG (BLADE) IMPLANT
BLADE SAG 18X100X1.27 (BLADE) ×3 IMPLANT
BLADE SAGITTAL 25.0X1.27X90 (BLADE) ×2 IMPLANT
BLADE SAGITTAL 25.0X1.27X90MM (BLADE) ×1
BNDG CMPR 9X6 STRL LF SNTH (GAUZE/BANDAGES/DRESSINGS) ×1
BNDG CMPR MED 10X6 ELC LF (GAUZE/BANDAGES/DRESSINGS) ×1
BNDG ELASTIC 6X10 VLCR STRL LF (GAUZE/BANDAGES/DRESSINGS) ×2 IMPLANT
BNDG ESMARK 6X9 LF (GAUZE/BANDAGES/DRESSINGS) ×3
BOWL SMART MIX CTS (DISPOSABLE) IMPLANT
CONT SPEC 4OZ CLIKSEAL STRL BL (MISCELLANEOUS) ×4 IMPLANT
COVER SURGICAL LIGHT HANDLE (MISCELLANEOUS) ×3 IMPLANT
COVER WAND RF STERILE (DRAPES) ×3 IMPLANT
CUFF TOURNIQUET SINGLE 34IN LL (TOURNIQUET CUFF) ×2 IMPLANT
CUFF TOURNIQUET SINGLE 44IN (TOURNIQUET CUFF) IMPLANT
DRAPE EXTREMITY T 121X128X90 (DISPOSABLE) ×2 IMPLANT
DRAPE ORTHO SPLIT 77X108 STRL (DRAPES) ×6
DRAPE POUCH INSTRU U-SHP 10X18 (DRAPES) ×3 IMPLANT
DRAPE SURG ORHT 6 SPLT 77X108 (DRAPES) ×2 IMPLANT
DRAPE U-SHAPE 47X51 STRL (DRAPES) ×3 IMPLANT
DRSG XEROFORM 1X8 (GAUZE/BANDAGES/DRESSINGS) ×2 IMPLANT
DURAPREP 26ML APPLICATOR (WOUND CARE) ×6 IMPLANT
ELECT CAUTERY BLADE 6.4 (BLADE) ×3 IMPLANT
ELECT REM PT RETURN 9FT ADLT (ELECTROSURGICAL) ×3
ELECTRODE REM PT RTRN 9FT ADLT (ELECTROSURGICAL) ×1 IMPLANT
EVACUATOR 1/8 PVC DRAIN (DRAIN) IMPLANT
GAUZE SPONGE 4X4 12PLY STRL (GAUZE/BANDAGES/DRESSINGS) ×2 IMPLANT
GLOVE BIOGEL PI IND STRL 7.0 (GLOVE) ×1 IMPLANT
GLOVE BIOGEL PI INDICATOR 7.0 (GLOVE) ×2
GLOVE ECLIPSE 7.0 STRL STRAW (GLOVE) ×3 IMPLANT
GLOVE SKINSENSE NS SZ7.5 (GLOVE) ×4
GLOVE SKINSENSE STRL SZ7.5 (GLOVE) ×2 IMPLANT
GOWN STRL REIN XL XLG (GOWN DISPOSABLE) ×3 IMPLANT
HANDPIECE INTERPULSE COAX TIP (DISPOSABLE) ×3
HOOD PEEL AWAY FLYTE STAYCOOL (MISCELLANEOUS) ×6 IMPLANT
KIT BASIN OR (CUSTOM PROCEDURE TRAY) ×3 IMPLANT
KIT TURNOVER KIT B (KITS) ×3 IMPLANT
MANIFOLD NEPTUNE II (INSTRUMENTS) ×3 IMPLANT
MARKER SKIN DUAL TIP RULER LAB (MISCELLANEOUS) ×3 IMPLANT
NDL SPNL 18GX3.5 QUINCKE PK (NEEDLE) ×1 IMPLANT
NEEDLE SPNL 18GX3.5 QUINCKE PK (NEEDLE) ×3 IMPLANT
NS IRRIG 1000ML POUR BTL (IV SOLUTION) ×3 IMPLANT
PACK TOTAL JOINT (CUSTOM PROCEDURE TRAY) ×3 IMPLANT
PAD ABD 8X10 STRL (GAUZE/BANDAGES/DRESSINGS) ×4 IMPLANT
PAD ARMBOARD 7.5X6 YLW CONV (MISCELLANEOUS) ×6 IMPLANT
PAD CAST 4YDX4 CTTN HI CHSV (CAST SUPPLIES) IMPLANT
PADDING CAST COTTON 4X4 STRL (CAST SUPPLIES) ×6
SAW OSC TIP CART 19.5X105X1.3 (SAW) ×2 IMPLANT
SEALER BIPOLAR AQUA 6.0 (INSTRUMENTS) ×3 IMPLANT
SET HNDPC FAN SPRY TIP SCT (DISPOSABLE) IMPLANT
SUCTION FRAZIER HANDLE 10FR (MISCELLANEOUS) ×2
SUCTION TUBE FRAZIER 10FR DISP (MISCELLANEOUS) ×1 IMPLANT
SUT ETHILON 2 0 FS 18 (SUTURE) ×14 IMPLANT
SUT MNCRL AB 3-0 PS2 27 (SUTURE) ×1 IMPLANT
SUT PDS AB 1 CT  36 (SUTURE)
SUT PDS AB 1 CT 36 (SUTURE) ×2 IMPLANT
SUT VIC AB 0 CT1 27 (SUTURE) ×6
SUT VIC AB 0 CT1 27XBRD ANBCTR (SUTURE) ×2 IMPLANT
SUT VIC AB 1 CTX 36 (SUTURE) ×9
SUT VIC AB 1 CTX36XBRD ANBCTR (SUTURE) ×3 IMPLANT
SUT VIC AB 2-0 CT1 27 (SUTURE) ×6
SUT VIC AB 2-0 CT1 TAPERPNT 27 (SUTURE) ×2 IMPLANT
SWAB COLLECTION DEVICE MRSA (MISCELLANEOUS) ×2 IMPLANT
SWAB CULTURE ESWAB REG 1ML (MISCELLANEOUS) ×3 IMPLANT
SYR 20CC LL (SYRINGE) ×3 IMPLANT
SYR 50ML LL SCALE MARK (SYRINGE) ×3 IMPLANT
TOWEL OR 17X24 6PK STRL BLUE (TOWEL DISPOSABLE) ×3 IMPLANT
TOWEL OR 17X26 10 PK STRL BLUE (TOWEL DISPOSABLE) ×3 IMPLANT
TRAY FOLEY MTR SLVR 16FR STAT (SET/KITS/TRAYS/PACK) IMPLANT

## 2018-06-15 NOTE — Anesthesia Procedure Notes (Signed)
Procedure Name: MAC Date/Time: 06/15/2018 12:16 PM Performed by: Leonor Liv, CRNA Pre-anesthesia Checklist: Patient identified, Emergency Drugs available, Suction available and Patient being monitored Oxygen Delivery Method: Simple face mask Placement Confirmation: positive ETCO2 Dental Injury: Teeth and Oropharynx as per pre-operative assessment

## 2018-06-15 NOTE — Anesthesia Procedure Notes (Signed)
Spinal  Patient location during procedure: OR Start time: 06/15/2018 12:18 PM End time: 06/15/2018 12:20 PM Staffing Anesthesiologist: Bethena Midget, MD Preanesthetic Checklist Completed: patient identified, site marked, surgical consent, pre-op evaluation, timeout performed, IV checked, risks and benefits discussed and monitors and equipment checked Spinal Block Patient position: sitting Prep: DuraPrep Patient monitoring: heart rate, cardiac monitor, continuous pulse ox and blood pressure Approach: midline Location: L3-4 Injection technique: single-shot Needle Needle type: Sprotte  Needle gauge: 24 G Needle length: 9 cm Assessment Sensory level: T4

## 2018-06-15 NOTE — Anesthesia Procedure Notes (Signed)
Anesthesia Regional Block: Adductor canal block   Pre-Anesthetic Checklist: ,, timeout performed, Correct Patient, Correct Site, Correct Laterality, Correct Procedure, Correct Position, site marked, Risks and benefits discussed,  Surgical consent,  Pre-op evaluation,  At surgeon's request and post-op pain management  Laterality: Left  Prep: chloraprep       Needles:  Injection technique: Single-shot  Needle Type: Echogenic Stimulator Needle     Needle Length: 5cm  Needle Gauge: 22     Additional Needles:   Procedures:, nerve stimulator,,, ultrasound used (permanent image in chart),,,,  Narrative:  Start time: 06/15/2018 11:35 AM End time: 06/15/2018 12:40 PM Injection made incrementally with aspirations every 5 mL.  Performed by: Personally  Anesthesiologist: Bethena Midget, MD  Additional Notes: Functioning IV was confirmed and monitors were applied.  A 32mm 22ga Arrow echogenic stimulator needle was used. Sterile prep and drape,hand hygiene and sterile gloves were used. Ultrasound guidance: relevant anatomy identified, needle position confirmed, local anesthetic spread visualized around nerve(s)., vascular puncture avoided.  Image printed for medical record. Negative aspiration and negative test dose prior to incremental administration of local anesthetic. The patient tolerated the procedure well.

## 2018-06-15 NOTE — Progress Notes (Signed)
Orthopedic Tech Progress Note Patient Details:  Michael [name] 04/15/1961 [id number]  CPM Left Knee CPM Left Knee: On Left Knee Flexion (Degrees): 90 Left Knee Extension (Degrees): 0 Additional Comments: Trapeze bar and foot roll  Post Interventions Patient Tolerated: Well Instructions Provided: Care of device  [name] 06/15/2018, 5:37 PM

## 2018-06-15 NOTE — Discharge Instructions (Signed)

## 2018-06-15 NOTE — Op Note (Signed)
Total Knee Arthroplasty Procedure Note  Preoperative diagnosis: Possible tibial loosening of left total knee replacement  Postoperative diagnosis: Painful left total knee replacement  Operative procedure: Revision of left tibial polyethylene liner  Surgeon: N. Glee Arvin, MD  Assist: Oneal Grout, PA-C; necessary for the timely completion of procedure and due to complexity of procedure.  Anesthesia: Spinal, regional  Tourniquet time: 60 mins  Implants used: Zimmer Polyethylene: 11 mm PS  Indication: Michael LODUCA is a 58 y.o. year old male with a painful left total knee replacement after undergoing an uncemented left total knee replacement approximately a year and half ago.  He started having pain recently after he returned back to his job.  Work-up for his knee pain showed possible aseptic loosening of the tibial component.  We had discussion about revising his tibial component and checking for possible indolent infection.  He was aware of the risks and benefits of surgery and wished to proceed.  Procedure:  After informed consent was obtained and understanding of the risk were voiced including but not limited to bleeding, infection, damage to surrounding structures including nerves and vessels, blood clots, leg length inequality and the failure to achieve desired results, the operative extremity was marked with verbal confirmation of the patient in the holding area.   The patient was then brought to the operating room and transported to the operating room table in the supine position.  A tourniquet was applied to the operative extremity around the upper thigh. The operative limb was then prepped and draped in the usual sterile fashion and preoperative antibiotics were administered.  A time out was performed prior to the start of surgery confirming the correct extremity, preoperative antibiotic administration, as well as team members, implants and instruments available for  the case. Correct surgical site was also confirmed with preoperative radiographs. The limb was then elevated for exsanguination and the tourniquet was inflated. A midline incision was made around the previous surgical scar which was fully ellipsed and a standard medial parapatellar approach was performed.  There was a minimal amount of joint fluid that did not appear to be infected.  Soft tissue releases were performed in order to achieve exposure.  The patella component itself was intact.  I took multiple soft tissue samples from the knee for frozens.  While we were waiting on the frozens I then proceeded with the revision surgery.  I first everted the patella and flexed the knee and placed retractors.  The polyethylene liner was gently removed without damaging the tibial tray.  The polyethylene liner itself was intact.  There was no evidence of premature wear.  I then assessed the femoral and the tibial component for any loosening.  I used an osteotome to evaluate it circumferentially to the look for any loosening but I was unable to find any evidence of loosening.  There is no evidence of infection.  The knee was then thoroughly irrigated with pulse lavage.  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  Therefore the decision was to reimplant a new polyethylene liner without revising the tibial tray or the femoral component.  After trialing I decided to use an 11 mm PS liner.  The real polyethylene liner, 11 mm thick, was inserted and checked to ensure the locking mechanism had engaged appropriately. The tourniquet was deflated and hemostasis was achieved. The wound was irrigated with normal saline.  One gram of vancomycin powder was  placed in the surgical bed. A drain was not placed. Capsular closure was performed with a #1 vicryl, subcutaneous fat closed with a 0 vicryl suture, then subcutaneous tissue closed with interrupted 2.0 vicryl  suture. The skin was then closed with a 2.0 nylon. A sterile dressing was applied.  The patient was awakened in the operating room and taken to recovery in stable condition. All sponge, needle, and instrument counts were correct at the end of the case.  Position: supine  Complications: none.  Time Out: performed   Drains/Packing: none  Estimated blood loss: minimal  Returned to Recovery Room: in good condition.   Antibiotics: yes   Mechanical VTE (DVT) Prophylaxis: sequential compression devices, TED thigh-high  Chemical VTE (DVT) Prophylaxis: aspirin  Fluid Replacement  Crystalloid: see anesthesia record Blood: none  FFP: none   Specimens Removed: 1 to pathology   Sponge and Instrument Count Correct? yes   PACU: portable radiograph - knee AP and Lateral   Plan/RTC: Return in 2 weeks for wound check.   Weight Bearing/Load Lower Extremity: full   N. Glee Arvin, MD [facility] [phone] 2:05 PM

## 2018-06-15 NOTE — H&P (Signed)
PREOPERATIVE H&P  Chief Complaint: loose left total knee arthroplasty  HPI: Michael [name] is a 58 y.o. male who presents for surgical treatment of loose left total knee arthroplasty.  He denies any changes in medical history.  Past Medical History:  Diagnosis Date  . Arthritis   . Hepatitis    Hep C rx  . Hypertension    Past Surgical History:  Procedure Laterality Date  . COLONOSCOPY    . left knee arthroscopy     . left rotator cuff surgery     . SHOULDER OPEN ROTATOR CUFF REPAIR Right 01/29/2013   Procedure: RIGHT SHOULDER MINI ROTATOR CUFF REPAIR;  Surgeon: Javier Docker, MD;  Location: [facility]S;  Service: Orthopedics;  Laterality: Right;  With ANCHORS  . TOTAL KNEE ARTHROPLASTY Left 02/12/2017  . TOTAL KNEE ARTHROPLASTY Left 02/12/2017   Procedure: LEFT TOTAL KNEE ARTHROPLASTY;  Surgeon: Tarry Kos, MD;  Location: [facility] OR;  Service: Orthopedics;  Laterality: Left;   Social History   Socioeconomic History  . Marital status: Single    Spouse name: Not on file  . Number of children: 0  . Years of education: 53  . Highest education level: Not on file  Occupational History  . Occupation: Chartered certified accountant  Social Needs  . Financial resource strain: Not on file  . Food insecurity:    Worry: Not on file    Inability: Not on file  . Transportation needs:    Medical: Not on file    Non-medical: Not on file  Tobacco Use  . Smoking status: Current Every Day Smoker    Packs/day: 0.50    Years: 37.00    Pack years: 18.50    Types: Cigarettes  . Smokeless tobacco: Never Used  Substance and Sexual Activity  . Alcohol use: Not Currently  . Drug use: No    Comment: hx of marijuana and cocaine, LSD  . Sexual activity: Yes    Partners: Female    Birth control/protection: None  Lifestyle  . Physical activity:    Days per week: Not on file    Minutes per session: Not on file  . Stress: Not on file  Relationships  . Social connections:    Talks on phone: Not on file   Gets together: Not on file    Attends religious service: Not on file    Active member of club or organization: Not on file    Attends meetings of clubs or organizations: Not on file    Relationship status: Not on file  Other Topics Concern  . Not on file  Social History Narrative  . Not on file   Family History  Problem Relation Age of Onset  . CAD Mother   . Deep vein thrombosis Brother    Allergies  Allergen Reactions  . Chantix [Varenicline] Itching and Rash   Prior to Admission medications   Medication Sig Start Date End Date Taking? Authorizing Provider  Aspirin-Salicylamide-Caffeine (BC FAST PAIN RELIEF) [phone] MG PACK Take 1 packet by mouth daily as needed (for pain or headache).   Yes [provider]  atorvastatin (LIPITOR) 10 MG tablet TAKE 1 TABLET BY MOUTH DAILY. Patient taking differently: Take 10 mg by mouth daily.  11/03/17  Yes Marcine Matar, MD  meloxicam (MOBIC) 15 MG tablet Take 15 mg by mouth daily as needed for pain.   Yes [provider]  methadone (DOLOPHINE) 10 MG tablet Take 50 mg by mouth daily.  Yes [provider]  amLODipine (NORVASC) 10 MG tablet Take 1 tablet (10 mg total) by mouth daily. MUST MAKE APPT FOR FURTHER REFILLS 06/10/18   Marcine MatarJohnson, Deborah B, MD  lisinopril-hydrochlorothiazide (PRINZIDE,ZESTORETIC) 20-25 MG tablet Take 1 tablet by mouth daily. MUST MAKE APPT FOR FURTHER REFILLS 06/10/18   Marcine MatarJohnson, Deborah B, MD     Positive ROS: All other systems have been reviewed and were otherwise negative with the exception of those mentioned in the HPI and as above.  Physical Exam: General: Alert, no acute distress Cardiovascular: No pedal edema Respiratory: No cyanosis, no use of accessory musculature GI: abdomen soft Skin: No lesions in the area of chief complaint Neurologic: Sensation intact distally Psychiatric: Patient is competent for consent with normal mood and affect Lymphatic: no  lymphedema  MUSCULOSKELETAL: exam stable  Assessment: loose left total knee arthroplasty  Plan: Plan for Procedure(s): LEFT TOTAL KNEE REVISION  The risks benefits and alternatives were discussed with the patient including but not limited to the risks of nonoperative treatment, versus surgical intervention including infection, bleeding, nerve injury,  blood clots, cardiopulmonary complications, morbidity, mortality, among others, and they were willing to proceed.   Preoperative templating of the joint replacement has been completed, documented, and submitted to the Operating Room personnel in order to optimize intra-operative equipment management.  Anticipated LOS equal to or greater than 2 midnights due to - Age 57 and older with one or more of the following:  - Obesity  - Expected need for hospital services (PT, OT, Nursing) required for safe  discharge  - Anticipated need for postoperative skilled nursing care or inpatient rehab  - Active co-morbidities: None  [name], MD   06/15/2018 9:33 AM

## 2018-06-15 NOTE — Transfer of Care (Signed)
Immediate Anesthesia Transfer of Care Note  Patient: Michael [name]  Procedure(s) Performed: LEFT TOTAL KNEE REVISION (Left Knee)  Patient Location: PACU  Anesthesia Type:MAC, Regional and Spinal  Level of Consciousness: awake, alert  and oriented  Airway & Oxygen Therapy: Patient Spontanous Breathing  Post-op Assessment: Report given to RN, Post -op Vital signs reviewed and stable and Patient moving all extremities  Post vital signs: Reviewed and stable  Last Vitals:  Vitals Value Taken Time  BP 128/68 06/15/2018  3:16 PM  Temp 36.2 C 06/15/2018  3:16 PM  Pulse 62 06/15/2018  3:22 PM  Resp 16 06/15/2018  3:23 PM  SpO2 95 % 06/15/2018  3:22 PM  Vitals shown include unvalidated device data.  Last Pain:  Vitals:   06/15/18 0950  TempSrc:   PainSc: 6       Patients Stated Pain Goal: 3 (95/28/41 3244)  Complications: No apparent anesthesia complications

## 2018-06-15 NOTE — Anesthesia Preprocedure Evaluation (Addendum)
Anesthesia Evaluation  Patient identified by MRN, date of birth, ID band Patient awake    Reviewed: Allergy & Precautions, H&P , NPO status , Patient's Chart, lab work & pertinent test results, reviewed documented beta blocker date and time   Airway Mallampati: II  TM Distance: >3 FB Neck ROM: full    Dental no notable dental hx. (+) Teeth Intact, Poor Dentition, Chipped,    Pulmonary neg pulmonary ROS, Current Smoker,    Pulmonary exam normal breath sounds clear to auscultation       Cardiovascular Exercise Tolerance: Good hypertension, Pt. on medications  Rhythm:regular Rate:Normal     Neuro/Psych negative neurological ROS  negative psych ROS   GI/Hepatic negative GI ROS, (+) Hepatitis -, C  Endo/Other  negative endocrine ROS  Renal/GU negative Renal ROS  negative genitourinary   Musculoskeletal  (+) Arthritis , Osteoarthritis,    Abdominal   Peds  Hematology negative hematology ROS (+)   Anesthesia Other Findings methadone (DOLOPHINE) 10 MG tablet Takes 50 mg by mouth daily.     Reproductive/Obstetrics negative OB ROS                            Anesthesia Physical Anesthesia Plan  ASA: III  Anesthesia Plan: Spinal   Post-op Pain Management:  Regional for Post-op pain   Induction:   PONV Risk Score and Plan: 1 and Ondansetron and Treatment may vary due to age or medical condition  Airway Management Planned: Nasal Cannula and Natural Airway  Additional Equipment:   Intra-op Plan:   Post-operative Plan:   Informed Consent: I have reviewed the patients History and Physical, chart, labs and discussed the procedure including the risks, benefits and alternatives for the proposed anesthesia with the patient or authorized representative who has indicated his/her understanding and acceptance.     Dental Advisory Given  Plan Discussed with: CRNA, Anesthesiologist and  Surgeon  Anesthesia Plan Comments:         Anesthesia Quick Evaluation

## 2018-06-16 ENCOUNTER — Encounter (HOSPITAL_COMMUNITY): Payer: Self-pay | Admitting: General Practice

## 2018-06-16 LAB — BASIC METABOLIC PANEL
Anion gap: 8 (ref 5–15)
BUN: 15 mg/dL (ref 6–20)
CALCIUM: 8.6 mg/dL — AB (ref 8.9–10.3)
CO2: 26 mmol/L (ref 22–32)
Chloride: 102 mmol/L (ref 98–111)
Creatinine, Ser: 1.01 mg/dL (ref 0.61–1.24)
GFR calc Af Amer: >60 mL/min (ref >60)
GFR calc non Af Amer: >60 mL/min (ref >60)
GLUCOSE: 113 mg/dL — AB (ref 70–99)
Potassium: 3.9 mmol/L (ref 3.5–5.1)
Sodium: 136 mmol/L (ref 135–145)

## 2018-06-16 LAB — CBC
HCT: 36.6 % — ABNORMAL LOW (ref 39.0–52.0)
Hemoglobin: 12 g/dL — ABNORMAL LOW (ref 13.0–17.0)
MCH: 29.8 pg (ref 26.0–34.0)
MCHC: 32.8 g/dL (ref 30.0–36.0)
MCV: 90.8 fL (ref 80.0–100.0)
Platelets: 216 10*3/uL (ref 150–400)
RBC: 4.03 MIL/uL — ABNORMAL LOW (ref 4.22–5.81)
RDW: 12.9 % (ref 11.5–15.5)
WBC: 8.2 10*3/uL (ref 4.0–10.5)
nRBC: 0 % (ref 0.0–0.2)

## 2018-06-16 NOTE — Progress Notes (Addendum)
Subjective: 1 Day Post-Op Procedure(s) (LRB): LEFT TOTAL KNEE REVISION (Left) Patient reports pain as moderate.    Objective: Vital signs in last 24 hours: Temp:  [97.2 F (36.2 C)-98.4 F (36.9 C)] 98.4 F (36.9 C) (01/21 0616) Pulse Rate:  [59-71] 64 (01/21 0616) Resp:  [8-20] 18 (01/21 0616) BP: (115-136)/(68-96) 134/81 (01/21 0616) SpO2:  [93 %-100 %] 100 % (01/21 0616) Weight:  [115.2 kg] 115.2 kg (01/20 0919)  Intake/Output from previous day: 01/20 0701 - 01/21 0700 In: 1840 [P.O.:240; I.V.:1600] Out: 610 [Urine:600; Blood:10] Intake/Output this shift: No intake/output data recorded.  Recent Labs    06/16/18 0226  HGB 12.0*   Recent Labs    06/16/18 0226  WBC 8.2  RBC 4.03*  HCT 36.6*  PLT 216   Recent Labs    06/16/18 0226  NA 136  K 3.9  CL 102  CO2 26  BUN 15  CREATININE 1.01  GLUCOSE 113*  CALCIUM 8.6*   No results for input(s): LABPT, INR in the last 72 hours.  Neurologically intact Neurovascular intact Sensation intact distally Intact pulses distally Dorsiflexion/Plantar flexion intact Incision: scant drainage No cellulitis present Compartment soft  Anticipated LOS equal to or greater than 2 midnights due to - Age 57 and older with one or more of the following:  - Obesity  - Expected need for hospital services (PT, OT, Nursing) required for safe  discharge  - Anticipated need for postoperative skilled nursing care or inpatient rehab  - Active co-morbidities: None OR   - Unanticipated findings during/Post Surgery: Slow post-op progression: GI, pain control, mobility  - Patient is a high risk of re-admission due to: None   Assessment/Plan: 1 Day Post-Op Procedure(s) (LRB): LEFT TOTAL KNEE REVISION (Left) Advance diet Up with therapy D/C IV fluids Discharge home with home health after first or second session of PT (depends on how well patient mobilizes) WBAT LLE ABLA-mild and stable Bandage changed by me today Please apply ted  hose to LLE   Cristie Hem 06/16/2018, 8:01 AM

## 2018-06-16 NOTE — Plan of Care (Signed)
  Problem: Activity: Goal: Ability to avoid complications of mobility impairment will improve Outcome: Progressing Goal: Range of joint motion will improve Outcome: Progressing   Problem: Activity: Goal: Range of joint motion will improve Outcome: Progressing   Problem: Pain Management: Goal: Pain level will decrease with appropriate interventions Outcome: Progressing   Problem: Skin Integrity: Goal: Will show signs of wound healing Outcome: Progressing

## 2018-06-16 NOTE — Care Management Note (Signed)
Case Management Note  Patient Details  Name: [name] D Schwenke MRN: [id number] Date of Birth: 1961/03/10  Subjective/Objective:  Pt is a 58 y.o. male s/p L TKA revision due to loose hardware. Initial L TKA 01/2017.                  Action/Plan: Patient was preoperatively setup with [facility], no changes. Has all DME, will have support at discharge.  Expected Discharge Date:  06/16/18               Expected Discharge Plan:  Home w Home Health Services  In-House Referral:  NA  Discharge planning Services  CM Consult  Post Acute Care Choice:  Home Health Choice offered to:  Patient  DME Arranged:  N/A(HAS DME) DME Agency:  NA  HH Arranged:  PT HH Agency:  NA  Status of Service:  Completed, signed off  If discussed at Long Length of Stay Meetings, dates discussed:    Additional Comments:  Durenda GuthrieBrady, Winnifred Dufford Naomi, RN 06/16/2018, 10:44 AM

## 2018-06-16 NOTE — Anesthesia Postprocedure Evaluation (Signed)
Anesthesia Post Note  Patient: Michael [name]  Procedure(s) Performed: LEFT TOTAL KNEE REVISION (Left Knee)     Patient location during evaluation: PACU Anesthesia Type: Spinal Level of consciousness: oriented and awake and alert Pain management: pain level controlled Vital Signs Assessment: post-procedure vital signs reviewed and stable Respiratory status: spontaneous breathing, respiratory function stable and patient connected to nasal cannula oxygen Cardiovascular status: blood pressure returned to baseline and stable Postop Assessment: no headache, no backache and no apparent nausea or vomiting Anesthetic complications: no    Last Vitals:  Vitals:   06/15/18 1612 06/16/18 0616  BP: 128/79 134/81  Pulse: 68 64  Resp: 16 18  Temp: (!) 36.3 C 36.9 C  SpO2: 96% 100%    Last Pain:  Vitals:   06/16/18 0616  TempSrc: Oral  PainSc: 8                  Caliana Spires

## 2018-06-16 NOTE — Progress Notes (Signed)
Discharge instructions (including medications) discussed with and copy provided to patient/caregiver 

## 2018-06-16 NOTE — Evaluation (Signed)
Physical Therapy Evaluation Patient Details Name: Michael [name] MRN: [id number] DOB: 25-Apr-1961 Today's Date: 06/16/2018   History of Present Illness  Pt is a 58 y.o. male s/p L TKA revision due to loose hardware. Initial L TKA 01/2017. PMH consists of OA, hep C and HTN.     Clinical Impression  PT eval complete. Pt demonstrated modified independence bed mobility. Supervision provided for transfers and ambulation 250 feet with RW. Pt performed LLE exercises in recliner with feet elevated. Exercise handouts provided. Plan is for d/c home today. All education complete. Pt reports no further questions/concerns. PT signing off.    Follow Up Recommendations Follow surgeon's recommendation for DC plan and follow-up therapies    Equipment Recommendations  None recommended by PT(Pt has all needed DME.)    Recommendations for Other Services       Precautions / Restrictions Precautions Precautions: None Restrictions Weight Bearing Restrictions: Yes LLE Weight Bearing: Weight bearing as tolerated      Mobility  Bed Mobility Overal bed mobility: Modified Independent             General bed mobility comments: increased time and effort  Transfers Overall transfer level: Needs assistance Equipment used: Rolling walker (2 wheeled) Transfers: Sit to/from Stand Sit to Stand: Supervision         General transfer comment: cues for hand placement  Ambulation/Gait Ambulation/Gait assistance: Supervision Gait Distance (Feet): 250 Feet Assistive device: Rolling walker (2 wheeled) Gait Pattern/deviations: Step-through pattern;Decreased weight shift to left;Antalgic Gait velocity: WFL Gait velocity interpretation: >2.62 ft/sec, indicative of community ambulatory General Gait Details: cues for step-through pattern and heel strike/toe off LLE  Stairs Stairs: (verbally reviewed ascend/descend one step with RW. Pt recalls from previous admission.)          Wheelchair Mobility     Modified Rankin (Stroke Patients Only)       Balance Overall balance assessment: Mild deficits observed, not formally tested                                           Pertinent Vitals/Pain Pain Assessment: 0-10 Pain Score: 6  Pain Location: L knee Pain Descriptors / Indicators: Sore;Tightness;Grimacing Pain Intervention(s): Monitored during session;Repositioned;Ice applied    Home Living Family/patient expects to be discharged to:: Private residence Living Arrangements: Non-relatives/Friends Available Help at Discharge: Friend(s);Available PRN/intermittently Type of Home: House Home Access: Stairs to enter Entrance Stairs-Rails: None Entrance Stairs-Number of Steps: 1 Home Layout: One level Home Equipment: Walker - 2 wheels;Bedside commode;Cane - single point      Prior Function Level of Independence: Independent               Hand Dominance   Dominant Hand: Right    Extremity/Trunk Assessment   Upper Extremity Assessment Upper Extremity Assessment: Overall WFL for tasks assessed    Lower Extremity Assessment Lower Extremity Assessment: LLE deficits/detail LLE Deficits / Details: s/p TKA revision    Cervical / Trunk Assessment Cervical / Trunk Assessment: Normal  Communication   Communication: No difficulties  Cognition Arousal/Alertness: Awake/alert Behavior During Therapy: WFL for tasks assessed/performed Overall Cognitive Status: Within Functional Limits for tasks assessed                                        General Comments  Exercises Total Joint Exercises Ankle Circles/Pumps: AROM;Both;10 reps Quad Sets: AROM;Left;10 reps Heel Slides: AROM;10 reps;Left Goniometric ROM: 5-90 degrees L knee   Assessment/Plan    PT Assessment All further PT needs can be met in the next venue of care  PT Problem List Pain;Decreased range of motion;Decreased strength;Decreased mobility;Decreased activity  tolerance       PT Treatment Interventions      PT Goals (Current goals can be found in the Care Plan section)  Acute Rehab PT Goals Patient Stated Goal: home today PT Goal Formulation: All assessment and education complete, DC therapy    Frequency     Barriers to discharge        Co-evaluation               AM-PAC PT "6 Clicks" Mobility  Outcome Measure Help needed turning from your back to your side while in a flat bed without using bedrails?: None Help needed moving from lying on your back to sitting on the side of a flat bed without using bedrails?: None Help needed moving to and from a bed to a chair (including a wheelchair)?: A Little Help needed standing up from a chair using your arms (e.g., wheelchair or bedside chair)?: A Little Help needed to walk in hospital room?: A Little Help needed climbing 3-5 steps with a railing? : A Little 6 Click Score: 20    End of Session Equipment Utilized During Treatment: Gait belt Activity Tolerance: Patient tolerated treatment well Patient left: in chair;with call bell/phone within reach Nurse Communication: Mobility status PT Visit Diagnosis: Difficulty in walking, not elsewhere classified (R26.2)    Time: 9311-2162 PT Time Calculation (min) (ACUTE ONLY): 32 min   Charges:   PT Evaluation $PT Eval Low Complexity: 1 Low PT Treatments $Gait Training: 8-22 mins        Lorrin Goodell, PT  Office # [phone] Pager [phone]   Lorriane Shire 06/16/2018, 9:27 AM

## 2018-06-16 NOTE — Discharge Summary (Signed)
Patient ID: Michael [name] MRN: [id number] DOB/AGE: 57-Jul-1962 57 y.o.  Admit date: 06/15/2018 Discharge date: 06/16/2018  Admission Diagnoses:  Principal Problem:   Loose left total knee arthroplasty [name], left   Discharge Diagnoses:  Same  Past Medical History:  Diagnosis Date  . Arthritis   . Hepatitis    Hep C rx  . Hypertension     Surgeries: Procedure(s): LEFT TOTAL KNEE REVISION on 06/15/2018   Consultants:   Discharged Condition: Improved  Hospital Course: Michael [name] is an 58 y.o. male who was admitted 06/15/2018 for operative treatment ofLoose left total knee arthroplasty (HCC). Patient has severe unremitting pain that affects sleep, daily activities, and work/hobbies. After pre-op clearance the patient was taken to the operating room on 06/15/2018 and underwent  Procedure(s): LEFT TOTAL KNEE REVISION.    Patient was given perioperative antibiotics:  Anti-infectives (From admission, onward)   Start     Dose/Rate Route Frequency Ordered Stop   06/15/18 1900  ceFAZolin (ANCEF) IVPB 2g/100 mL premix     2 g 200 mL/hr over 30 Minutes Intravenous Every 6 hours 06/15/18 1651 06/16/18 0659   06/15/18 1430  vancomycin (VANCOCIN) powder  Status:  Discontinued       As needed 06/15/18 1430 06/15/18 1511   06/15/18 1245  vancomycin (VANCOCIN) IVPB 1000 mg/200 mL premix     1,000 mg 200 mL/hr over 60 Minutes Intravenous To Surgery 06/15/18 1238 06/15/18 1406       Patient was given sequential compression devices, early ambulation, and chemoprophylaxis to prevent DVT.  Patient benefited maximally from hospital stay and there were no complications.    Recent vital signs:  Patient Vitals for the past 24 hrs:  BP Temp Temp src Pulse Resp SpO2 Height Weight  06/16/18 0616 134/81 98.4 F (36.9 C) Oral 64 18 100 % - -  06/15/18 1612 128/79 (!) 97.4 F (36.3 C) Oral 68 16 96 % - -  06/15/18 1551 - (!) 97.3 F (36.3  C) - - - - - -  06/15/18 1545 115/81 - - 63 10 93 % - -  06/15/18 1544 - - - - 13 - - -  06/15/18 1530 121/70 - - 68 20 93 % - -  06/15/18 1518 - - - 66 14 94 % - -  06/15/18 1516 128/68 (!) 97.2 F (36.2 C) - 65 (!) 8 93 % - -  06/15/18 1132 (!) 121/96 - - 65 16 97 % - -  06/15/18 1127 136/71 - - (!) 59 10 99 % - -  06/15/18 0919 135/75 97.8 F (36.6 C) Oral 71 20 98 % 5\' 10"  (1.778 m) 115.2 kg     Recent laboratory studies:  Recent Labs    06/16/18 0226  WBC 8.2  HGB 12.0*  HCT 36.6*  PLT 216  NA 136  K 3.9  CL 102  CO2 26  BUN 15  CREATININE 1.01  GLUCOSE 113*  CALCIUM 8.6*     Discharge Medications:   Allergies as of 06/16/2018      Reactions   Chantix [varenicline] Itching, Rash      Medication List    STOP taking these medications   BC FAST PAIN RELIEF 650-195-33.3 MG Pack Generic drug:  Aspirin-Salicylamide-Caffeine   meloxicam 15 MG tablet Commonly known as:  MOBIC     TAKE these medications   amLODipine 10 MG tablet Commonly known as:  NORVASC Take  1 tablet (10 mg total) by mouth daily. MUST MAKE APPT FOR FURTHER REFILLS   aspirin EC 325 MG tablet Take 1 tablet (325 mg total) by mouth 2 (two) times daily.   atorvastatin 10 MG tablet Commonly known as:  LIPITOR TAKE 1 TABLET BY MOUTH DAILY.   lisinopril-hydrochlorothiazide 20-25 MG tablet Commonly known as:  PRINZIDE,ZESTORETIC Take 1 tablet by mouth daily. MUST MAKE APPT FOR FURTHER REFILLS   methadone 10 MG tablet Commonly known as:  DOLOPHINE Take 50 mg by mouth daily.   methocarbamol 750 MG tablet Commonly known as:  ROBAXIN Take 1 tablet (750 mg total) by mouth 2 (two) times daily as needed for muscle spasms.   ondansetron 4 MG tablet Commonly known as:  ZOFRAN Take 1-2 tablets (4-8 mg total) by mouth every 8 (eight) hours as needed for nausea or vomiting.   oxyCODONE 10 mg 12 hr tablet Commonly known as:  OXYCONTIN Take 1 tablet (10 mg total) by mouth every 12 (twelve) hours  for 3 days.   oxyCODONE 5 MG immediate release tablet Commonly known as:  Oxy IR/ROXICODONE Take 1-3 tablets (5-15 mg total) by mouth every 4 (four) hours as needed.   promethazine 25 MG tablet Commonly known as:  PHENERGAN Take 1 tablet (25 mg total) by mouth every 6 (six) hours as needed for nausea.   senna-docusate 8.6-50 MG tablet Commonly known as:  SENOKOT S Take 1 tablet by mouth at bedtime as needed.            Durable Medical Equipment  (From admission, onward)         Start     Ordered   06/15/18 1651  DME Walker rolling  Once    Question:  Patient needs a walker to treat with the following condition  Answer:  Total knee replacement status   06/15/18 1651   06/15/18 1651  DME 3 n 1  Once     06/15/18 1651   06/15/18 1651  DME Bedside commode  Once    Question:  Patient needs a bedside commode to treat with the following condition  Answer:  Total knee replacement status   06/15/18 1651          Diagnostic Studies: Dg Knee Left Port  Result Date: 06/15/2018 CLINICAL DATA:  58 year old male post knee revision. Subsequent encounter. EXAM: PORTABLE LEFT KNEE - 1-2 VIEW COMPARISON:  12/11/2017 CT. FINDINGS: Post total left knee replacement. Subtle lucency at the tibial component - bone interface may represent expected findings. Clinical correlation recommended. No surrounding fracture noted. Small radiopaque structure anterior to the proximal tibia new from prior CT. IMPRESSION: 1. Post total left knee replacement. Subtle lucency at the tibial component - bone interface may represent expected findings. Clinical correlation recommended. 2. Small radiopaque structure anterior to the proximal tibia new from prior CT. Electronically Signed   By: Lacy Duverney M.D.   On: 06/15/2018 16:10    Disposition: Discharge disposition: 01-Home or Self Care         Follow-up Information    Tarry Kos, MD In 2 weeks.   Specialty:  Orthopedic Surgery Why:  For suture  removal, For wound re-check Contact information: [address] [place] [address] [phone]            Signed: Cristie Hem 06/16/2018, 8:05 AM

## 2018-06-17 ENCOUNTER — Telehealth (INDEPENDENT_AMBULATORY_CARE_PROVIDER_SITE_OTHER): Payer: Self-pay | Admitting: Orthopaedic Surgery

## 2018-06-17 DIAGNOSIS — Z7982 Long term (current) use of aspirin: Secondary | ICD-10-CM | POA: Diagnosis not present

## 2018-06-17 DIAGNOSIS — M75101 Unspecified rotator cuff tear or rupture of right shoulder, not specified as traumatic: Secondary | ICD-10-CM | POA: Diagnosis not present

## 2018-06-17 DIAGNOSIS — E785 Hyperlipidemia, unspecified: Secondary | ICD-10-CM | POA: Diagnosis not present

## 2018-06-17 DIAGNOSIS — F1911 Other psychoactive substance abuse, in remission: Secondary | ICD-10-CM | POA: Diagnosis not present

## 2018-06-17 DIAGNOSIS — F17210 Nicotine dependence, cigarettes, uncomplicated: Secondary | ICD-10-CM | POA: Diagnosis not present

## 2018-06-17 DIAGNOSIS — Z9181 History of falling: Secondary | ICD-10-CM | POA: Diagnosis not present

## 2018-06-17 DIAGNOSIS — I10 Essential (primary) hypertension: Secondary | ICD-10-CM | POA: Diagnosis not present

## 2018-06-17 DIAGNOSIS — T84033D Mechanical loosening of internal left knee prosthetic joint, subsequent encounter: Secondary | ICD-10-CM | POA: Diagnosis not present

## 2018-06-17 NOTE — Telephone Encounter (Signed)
Kreg Shropshire- (PT) with [facility] called left voicemail message needing verbal orders for HHPT twice this week,3 times next week and once the following week. Jae Dire said the pharmacy did not fill patient's Rx for (Oxycontin) Patient only have 13 pills left of his (Oxycodone) because he's been taking that as ordered and additional medication in place of the Oxycontin. She advised may want to reach out to the patient to see what is going to be done about pain management. The number to contact Jae Dire is [phone]

## 2018-06-17 NOTE — Telephone Encounter (Signed)
HHPT is fine.  Can we contact his pain management to let them know he had surgery and that we will prescribe some pain meds for him.  He will need to get the methadone from his pain doctor though.

## 2018-06-17 NOTE — Telephone Encounter (Signed)
See message below  Please advise

## 2018-06-18 ENCOUNTER — Telehealth (INDEPENDENT_AMBULATORY_CARE_PROVIDER_SITE_OTHER): Payer: Self-pay | Admitting: Orthopaedic Surgery

## 2018-06-18 NOTE — Telephone Encounter (Signed)
See other message

## 2018-06-18 NOTE — Telephone Encounter (Signed)
Patient called asked for a call back concerning his pain medication. Patient said his (PT) called yesterday for him. The  Number to contact patient is [phone]

## 2018-06-19 DIAGNOSIS — F1911 Other psychoactive substance abuse, in remission: Secondary | ICD-10-CM | POA: Diagnosis not present

## 2018-06-19 DIAGNOSIS — T84033D Mechanical loosening of internal left knee prosthetic joint, subsequent encounter: Secondary | ICD-10-CM | POA: Diagnosis not present

## 2018-06-19 DIAGNOSIS — M75101 Unspecified rotator cuff tear or rupture of right shoulder, not specified as traumatic: Secondary | ICD-10-CM | POA: Diagnosis not present

## 2018-06-19 DIAGNOSIS — I10 Essential (primary) hypertension: Secondary | ICD-10-CM | POA: Diagnosis not present

## 2018-06-19 DIAGNOSIS — F17210 Nicotine dependence, cigarettes, uncomplicated: Secondary | ICD-10-CM | POA: Diagnosis not present

## 2018-06-19 DIAGNOSIS — Z9181 History of falling: Secondary | ICD-10-CM | POA: Diagnosis not present

## 2018-06-19 DIAGNOSIS — E785 Hyperlipidemia, unspecified: Secondary | ICD-10-CM | POA: Diagnosis not present

## 2018-06-19 DIAGNOSIS — Z7982 Long term (current) use of aspirin: Secondary | ICD-10-CM | POA: Diagnosis not present

## 2018-06-19 NOTE — Telephone Encounter (Signed)
Called Jae Dire back no answer. LMOM.

## 2018-06-20 LAB — AEROBIC/ANAEROBIC CULTURE W GRAM STAIN (SURGICAL/DEEP WOUND)
Culture: NO GROWTH
Culture: NO GROWTH
Gram Stain: NONE SEEN
Gram Stain: NONE SEEN

## 2018-06-20 LAB — AEROBIC/ANAEROBIC CULTURE (SURGICAL/DEEP WOUND)

## 2018-06-22 DIAGNOSIS — Z7982 Long term (current) use of aspirin: Secondary | ICD-10-CM | POA: Diagnosis not present

## 2018-06-22 DIAGNOSIS — M75101 Unspecified rotator cuff tear or rupture of right shoulder, not specified as traumatic: Secondary | ICD-10-CM | POA: Diagnosis not present

## 2018-06-22 DIAGNOSIS — Z9181 History of falling: Secondary | ICD-10-CM | POA: Diagnosis not present

## 2018-06-22 DIAGNOSIS — E785 Hyperlipidemia, unspecified: Secondary | ICD-10-CM | POA: Diagnosis not present

## 2018-06-22 DIAGNOSIS — T84033D Mechanical loosening of internal left knee prosthetic joint, subsequent encounter: Secondary | ICD-10-CM | POA: Diagnosis not present

## 2018-06-22 DIAGNOSIS — I10 Essential (primary) hypertension: Secondary | ICD-10-CM | POA: Diagnosis not present

## 2018-06-22 DIAGNOSIS — F1911 Other psychoactive substance abuse, in remission: Secondary | ICD-10-CM | POA: Diagnosis not present

## 2018-06-22 DIAGNOSIS — F17210 Nicotine dependence, cigarettes, uncomplicated: Secondary | ICD-10-CM | POA: Diagnosis not present

## 2018-06-22 MED ORDER — OXYCODONE HCL 5 MG PO TABS: 5.0000 mg | ORAL_TABLET | Freq: Three times a day (TID) | ORAL | 0 refills | Status: DC | PRN

## 2018-06-22 NOTE — Telephone Encounter (Signed)
See message. Can we call in RX to pharm

## 2018-06-22 NOTE — Telephone Encounter (Signed)
Pt called in wanting to get a refill on his OxyCODONE (OXY IR/ROXICODONE) 5 MG immediate release tablet. Pt is using the [facility] on [address]. Says he didn't have a clear understand of how to last medication, he needs this to be refilled to help with PT

## 2018-06-24 DIAGNOSIS — T84033D Mechanical loosening of internal left knee prosthetic joint, subsequent encounter: Secondary | ICD-10-CM | POA: Diagnosis not present

## 2018-06-24 DIAGNOSIS — E785 Hyperlipidemia, unspecified: Secondary | ICD-10-CM | POA: Diagnosis not present

## 2018-06-24 DIAGNOSIS — F1911 Other psychoactive substance abuse, in remission: Secondary | ICD-10-CM | POA: Diagnosis not present

## 2018-06-24 DIAGNOSIS — Z9181 History of falling: Secondary | ICD-10-CM | POA: Diagnosis not present

## 2018-06-24 DIAGNOSIS — Z7982 Long term (current) use of aspirin: Secondary | ICD-10-CM | POA: Diagnosis not present

## 2018-06-24 DIAGNOSIS — F17210 Nicotine dependence, cigarettes, uncomplicated: Secondary | ICD-10-CM | POA: Diagnosis not present

## 2018-06-24 DIAGNOSIS — I10 Essential (primary) hypertension: Secondary | ICD-10-CM | POA: Diagnosis not present

## 2018-06-24 DIAGNOSIS — M75101 Unspecified rotator cuff tear or rupture of right shoulder, not specified as traumatic: Secondary | ICD-10-CM | POA: Diagnosis not present

## 2018-06-26 DIAGNOSIS — F1911 Other psychoactive substance abuse, in remission: Secondary | ICD-10-CM | POA: Diagnosis not present

## 2018-06-26 DIAGNOSIS — Z9181 History of falling: Secondary | ICD-10-CM | POA: Diagnosis not present

## 2018-06-26 DIAGNOSIS — I10 Essential (primary) hypertension: Secondary | ICD-10-CM | POA: Diagnosis not present

## 2018-06-26 DIAGNOSIS — F17210 Nicotine dependence, cigarettes, uncomplicated: Secondary | ICD-10-CM | POA: Diagnosis not present

## 2018-06-26 DIAGNOSIS — E785 Hyperlipidemia, unspecified: Secondary | ICD-10-CM | POA: Diagnosis not present

## 2018-06-26 DIAGNOSIS — M75101 Unspecified rotator cuff tear or rupture of right shoulder, not specified as traumatic: Secondary | ICD-10-CM | POA: Diagnosis not present

## 2018-06-26 DIAGNOSIS — Z7982 Long term (current) use of aspirin: Secondary | ICD-10-CM | POA: Diagnosis not present

## 2018-06-26 DIAGNOSIS — T84033D Mechanical loosening of internal left knee prosthetic joint, subsequent encounter: Secondary | ICD-10-CM | POA: Diagnosis not present

## 2018-06-29 DIAGNOSIS — I10 Essential (primary) hypertension: Secondary | ICD-10-CM | POA: Diagnosis not present

## 2018-06-29 DIAGNOSIS — F17210 Nicotine dependence, cigarettes, uncomplicated: Secondary | ICD-10-CM | POA: Diagnosis not present

## 2018-06-29 DIAGNOSIS — Z9181 History of falling: Secondary | ICD-10-CM | POA: Diagnosis not present

## 2018-06-29 DIAGNOSIS — F1911 Other psychoactive substance abuse, in remission: Secondary | ICD-10-CM | POA: Diagnosis not present

## 2018-06-29 DIAGNOSIS — T84033D Mechanical loosening of internal left knee prosthetic joint, subsequent encounter: Secondary | ICD-10-CM | POA: Diagnosis not present

## 2018-06-29 DIAGNOSIS — Z7982 Long term (current) use of aspirin: Secondary | ICD-10-CM | POA: Diagnosis not present

## 2018-06-29 DIAGNOSIS — M75101 Unspecified rotator cuff tear or rupture of right shoulder, not specified as traumatic: Secondary | ICD-10-CM | POA: Diagnosis not present

## 2018-06-29 DIAGNOSIS — E785 Hyperlipidemia, unspecified: Secondary | ICD-10-CM | POA: Diagnosis not present

## 2018-06-30 ENCOUNTER — Encounter (INDEPENDENT_AMBULATORY_CARE_PROVIDER_SITE_OTHER): Payer: Self-pay | Admitting: Orthopaedic Surgery

## 2018-06-30 ENCOUNTER — Ambulatory Visit (INDEPENDENT_AMBULATORY_CARE_PROVIDER_SITE_OTHER): Payer: BLUE CROSS/BLUE SHIELD | Admitting: Orthopaedic Surgery

## 2018-06-30 DIAGNOSIS — Z96652 Presence of left artificial knee joint: Secondary | ICD-10-CM

## 2018-06-30 DIAGNOSIS — Z79891 Long term (current) use of opiate analgesic: Secondary | ICD-10-CM

## 2018-06-30 MED ORDER — OXYCODONE HCL 5 MG PO TABS: 5.0000 mg | ORAL_TABLET | Freq: Four times a day (QID) | ORAL | 0 refills | Status: DC | PRN

## 2018-06-30 NOTE — Progress Notes (Signed)
Post-Op Visit Note   Patient: Michael [name]           Date of Birth: August 09, 1960           MRN: [id number] Visit Date: 06/30/2018 PCP: Marcine MatarJohnson, Deborah B, MD   Assessment & Plan:  Chief Complaint:  Chief Complaint  Patient presents with  . Left Knee - Pain, Routine Post Op   Visit Diagnoses:  1. History of left knee replacement     Plan: Michael [name] is here today for his first postoperative appointment for his left knee surgery.  His intraoperative cultures and frozens were all negative for infection.  There was no loosening that I could find during surgery.  He is currently on short-term and long-term disability.  His pain is overall controlled with occasional oxycodone.  His incision is fully healed without any signs of infection.  We remove the sutures and applied Steri-Strips today.  His range of motion is improving significantly.  We will send him to outpatient physical therapy.  Oxycodone was refilled today.  Overall we discussed that he would probably benefit from a less physical job.  I think he was overdoing it.  I would like to see him back in 4 weeks for his 6-week follow-up.  Follow-Up Instructions: Return in about 4 weeks (around 07/28/2018).   Orders:  No orders of the defined types were placed in this encounter.  Meds ordered this encounter  Medications  . oxyCODONE (OXY IR/ROXICODONE) 5 MG immediate release tablet    Sig: Take 1 tablet (5 mg total) by mouth every 6 (six) hours as needed.    Dispense:  30 tablet    Refill:  0    Imaging: No results found.  PMFS History: Patient Active Problem List   Diagnosis Date Noted  . S/P revision of total knee, left 06/15/2018  . Loose left total knee arthroplasty (HCC) 05/12/2018  . Pain in left hip 08/14/2017  . Positive depression screening 05/22/2017  . History of left knee replacement 02/12/2017  . Hepatitis C virus infection cured after antiviral drug therapy 01/19/2017  . Unilateral primary osteoarthritis, left  knee 01/02/2017  . Hyperlipidemia 10/23/2016  . Hypertension 10/18/2016  . Tobacco dependence 10/18/2016  . Chronic pain of left knee 10/18/2016  . 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 Right rotator cuff tear 01/29/2013   Past Medical History:  Diagnosis Date  . Arthritis   . Hepatitis    Hep C rx  . Hypertension     Family History  Problem Relation Age of Onset  . CAD Mother   . Deep vein thrombosis Brother     Past Surgical History:  Procedure Laterality Date  . COLONOSCOPY    . left knee arthroscopy     . left rotator cuff surgery     . REVISION TOTAL KNEE ARTHROPLASTY Left 06/15/2018  . SHOULDER OPEN ROTATOR CUFF REPAIR Right 01/29/2013   Procedure: RIGHT SHOULDER MINI ROTATOR CUFF REPAIR;  Surgeon: Javier DockerJeffrey C Beane, MD;  Location: [facility];  Service: Orthopedics;  Laterality: Right;  With ANCHORS  . TOTAL KNEE ARTHROPLASTY Left 02/12/2017  . TOTAL KNEE ARTHROPLASTY Left 02/12/2017   Procedure: LEFT TOTAL KNEE ARTHROPLASTY;  Surgeon: Tarry KosXu,  M, MD;  Location: [facility];  Service: Orthopedics;  Laterality: Left;  . TOTAL KNEE REVISION Left 06/15/2018   Procedure: LEFT TOTAL KNEE REVISION;  Surgeon: Tarry KosXu,  M, MD;  Location: [facility];  Service: Orthopedics;  Laterality: Left;   Social History  Occupational History  . Occupation: Chartered certified accountant  Tobacco Use  . Smoking status: Current Every Day Smoker    Packs/day: 0.50    Years: 37.00    Pack years: 18.50    Types: Cigarettes  . Smokeless tobacco: Never Used  Substance and Sexual Activity  . Alcohol use: Not Currently  . Drug use: No    Comment: hx of marijuana and cocaine, LSD  . Sexual activity: Yes    Partners: Female    Birth control/protection: None

## 2018-07-08 ENCOUNTER — Telehealth (INDEPENDENT_AMBULATORY_CARE_PROVIDER_SITE_OTHER): Payer: Self-pay

## 2018-07-08 DIAGNOSIS — M25462 Effusion, left knee: Secondary | ICD-10-CM | POA: Diagnosis not present

## 2018-07-08 DIAGNOSIS — Z471 Aftercare following joint replacement surgery: Secondary | ICD-10-CM | POA: Diagnosis not present

## 2018-07-08 DIAGNOSIS — M25662 Stiffness of left knee, not elsewhere classified: Secondary | ICD-10-CM | POA: Diagnosis not present

## 2018-07-08 DIAGNOSIS — M25562 Pain in left knee: Secondary | ICD-10-CM | POA: Diagnosis not present

## 2018-07-08 NOTE — Telephone Encounter (Signed)
Michael [name] with [facility] would like PT order for left knee faxed to [phone].  CB# is [phone].  Please advise.  Thank you.

## 2018-07-10 ENCOUNTER — Other Ambulatory Visit: Payer: Self-pay | Admitting: Internal Medicine

## 2018-07-10 DIAGNOSIS — Z471 Aftercare following joint replacement surgery: Secondary | ICD-10-CM | POA: Diagnosis not present

## 2018-07-10 DIAGNOSIS — I10 Essential (primary) hypertension: Secondary | ICD-10-CM

## 2018-07-10 DIAGNOSIS — M25662 Stiffness of left knee, not elsewhere classified: Secondary | ICD-10-CM | POA: Diagnosis not present

## 2018-07-10 DIAGNOSIS — M25462 Effusion, left knee: Secondary | ICD-10-CM | POA: Diagnosis not present

## 2018-07-10 DIAGNOSIS — M25562 Pain in left knee: Secondary | ICD-10-CM | POA: Diagnosis not present

## 2018-07-10 MED FILL — ATORVASTATIN 10 MG TABLET: 10 | 30 days supply | Qty: 30 | Fill #7

## 2018-07-10 MED FILL — AMLODIPINE BESYLATE 10 MG T: 10 | 30 days supply | Qty: 30 | Fill #0

## 2018-07-10 NOTE — Telephone Encounter (Signed)
FAXED

## 2018-07-13 DIAGNOSIS — M25562 Pain in left knee: Secondary | ICD-10-CM | POA: Diagnosis not present

## 2018-07-13 DIAGNOSIS — M25462 Effusion, left knee: Secondary | ICD-10-CM | POA: Diagnosis not present

## 2018-07-13 DIAGNOSIS — Z471 Aftercare following joint replacement surgery: Secondary | ICD-10-CM | POA: Diagnosis not present

## 2018-07-13 DIAGNOSIS — M25662 Stiffness of left knee, not elsewhere classified: Secondary | ICD-10-CM | POA: Diagnosis not present

## 2018-07-13 MED FILL — LISINOPRIL-HCTZ 20-25 MG TA: 20-25 | 30 days supply | Qty: 30 | Fill #0

## 2018-07-15 DIAGNOSIS — M25462 Effusion, left knee: Secondary | ICD-10-CM | POA: Diagnosis not present

## 2018-07-15 DIAGNOSIS — M25562 Pain in left knee: Secondary | ICD-10-CM | POA: Diagnosis not present

## 2018-07-15 DIAGNOSIS — M25662 Stiffness of left knee, not elsewhere classified: Secondary | ICD-10-CM | POA: Diagnosis not present

## 2018-07-15 DIAGNOSIS — Z471 Aftercare following joint replacement surgery: Secondary | ICD-10-CM | POA: Diagnosis not present

## 2018-07-17 DIAGNOSIS — M25462 Effusion, left knee: Secondary | ICD-10-CM | POA: Diagnosis not present

## 2018-07-17 DIAGNOSIS — Z471 Aftercare following joint replacement surgery: Secondary | ICD-10-CM | POA: Diagnosis not present

## 2018-07-17 DIAGNOSIS — M25562 Pain in left knee: Secondary | ICD-10-CM | POA: Diagnosis not present

## 2018-07-17 DIAGNOSIS — M25662 Stiffness of left knee, not elsewhere classified: Secondary | ICD-10-CM | POA: Diagnosis not present

## 2018-07-20 DIAGNOSIS — M25462 Effusion, left knee: Secondary | ICD-10-CM | POA: Diagnosis not present

## 2018-07-20 DIAGNOSIS — Z471 Aftercare following joint replacement surgery: Secondary | ICD-10-CM | POA: Diagnosis not present

## 2018-07-20 DIAGNOSIS — M25662 Stiffness of left knee, not elsewhere classified: Secondary | ICD-10-CM | POA: Diagnosis not present

## 2018-07-20 DIAGNOSIS — M25562 Pain in left knee: Secondary | ICD-10-CM | POA: Diagnosis not present

## 2018-07-21 ENCOUNTER — Telehealth (INDEPENDENT_AMBULATORY_CARE_PROVIDER_SITE_OTHER): Payer: Self-pay | Admitting: Orthopaedic Surgery

## 2018-07-21 NOTE — Telephone Encounter (Signed)
Patient called requesting an RX refill on his Methocarbamol.  [facility] [address]  CB#[phone].  Thank you.

## 2018-07-22 DIAGNOSIS — M25462 Effusion, left knee: Secondary | ICD-10-CM | POA: Diagnosis not present

## 2018-07-22 DIAGNOSIS — M25562 Pain in left knee: Secondary | ICD-10-CM | POA: Diagnosis not present

## 2018-07-22 DIAGNOSIS — Z471 Aftercare following joint replacement surgery: Secondary | ICD-10-CM | POA: Diagnosis not present

## 2018-07-22 DIAGNOSIS — M25662 Stiffness of left knee, not elsewhere classified: Secondary | ICD-10-CM | POA: Diagnosis not present

## 2018-07-22 MED ORDER — METHOCARBAMOL 750 MG PO TABS: 750.0000 mg | ORAL_TABLET | Freq: Two times a day (BID) | ORAL | 0 refills | Status: DC | PRN

## 2018-07-22 NOTE — Addendum Note (Signed)
Addended by: Signa Kell on: 07/22/2018 04:38 PM   Modules accepted: Orders

## 2018-07-22 NOTE — Telephone Encounter (Signed)
Rx sent to pharm

## 2018-07-22 NOTE — Telephone Encounter (Signed)
Yes #30

## 2018-07-24 DIAGNOSIS — M25462 Effusion, left knee: Secondary | ICD-10-CM | POA: Diagnosis not present

## 2018-07-24 DIAGNOSIS — Z471 Aftercare following joint replacement surgery: Secondary | ICD-10-CM | POA: Diagnosis not present

## 2018-07-24 DIAGNOSIS — M25562 Pain in left knee: Secondary | ICD-10-CM | POA: Diagnosis not present

## 2018-07-24 DIAGNOSIS — M25662 Stiffness of left knee, not elsewhere classified: Secondary | ICD-10-CM | POA: Diagnosis not present

## 2018-07-27 DIAGNOSIS — M25462 Effusion, left knee: Secondary | ICD-10-CM | POA: Diagnosis not present

## 2018-07-27 DIAGNOSIS — Z471 Aftercare following joint replacement surgery: Secondary | ICD-10-CM | POA: Diagnosis not present

## 2018-07-27 DIAGNOSIS — M25662 Stiffness of left knee, not elsewhere classified: Secondary | ICD-10-CM | POA: Diagnosis not present

## 2018-07-27 DIAGNOSIS — M25562 Pain in left knee: Secondary | ICD-10-CM | POA: Diagnosis not present

## 2018-07-28 ENCOUNTER — Ambulatory Visit (INDEPENDENT_AMBULATORY_CARE_PROVIDER_SITE_OTHER): Payer: BLUE CROSS/BLUE SHIELD | Admitting: Orthopaedic Surgery

## 2018-07-28 DIAGNOSIS — Z96652 Presence of left artificial knee joint: Secondary | ICD-10-CM

## 2018-07-28 NOTE — Progress Notes (Signed)
Post-Op Visit Note   Patient: Michael [name]           Date of Birth: 1960-08-11           MRN: [id number] Visit Date: 07/28/2018 PCP: Marcine Matar, MD   Assessment & Plan:  Chief Complaint:  Chief Complaint  Patient presents with  . Left Knee - Pain, Routine Post Op   Visit Diagnoses:  1. S/P revision of total knee, left     Plan: Brenten is 6 weeks status post left total knee revision.  He is improving with physical therapy however he still has significant quadriceps weakness and difficulty with prolonged standing and walking.  His surgical scar is healed without any signs of infection.  His range of motion has significantly improved.  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  He is not ready to return back to work in my opinion based on my findings today.  We will see him back in 6 weeks with two-view x-rays of the left knee.  Questions encouraged and answered.  Follow-Up Instructions: Return in about 6 weeks (around 09/08/2018).   Orders:  No orders of the defined types were placed in this encounter.  No orders of the defined types were placed in this encounter.   Imaging: No results found.  PMFS History: Patient Active Problem List   Diagnosis Date Noted  . S/P revision of total knee, left 06/15/2018  . Pain in left hip 08/14/2017  . Positive depression screening 05/22/2017  . History of left knee replacement 02/12/2017  . Hepatitis C virus infection cured after antiviral drug therapy 01/19/2017  . Unilateral primary osteoarthritis, left knee 01/02/2017  . Hyperlipidemia 10/23/2016  . Hypertension 10/18/2016  . Tobacco dependence 10/18/2016  . Chronic pain of left knee 10/18/2016  . 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 Right rotator cuff tear 01/29/2013   Past Medical History:  Diagnosis Date  . Arthritis   . Hepatitis    Hep C rx  . Hypertension       Family History  Problem Relation Age of Onset  . CAD Mother   . Deep vein thrombosis Brother     Past Surgical History:  Procedure Laterality Date  . COLONOSCOPY    . left knee arthroscopy     . left rotator cuff surgery     . REVISION TOTAL KNEE ARTHROPLASTY Left 06/15/2018  . SHOULDER OPEN ROTATOR CUFF REPAIR Right 01/29/2013   Procedure: RIGHT SHOULDER MINI ROTATOR CUFF REPAIR;  Surgeon: Javier Docker, MD;  Location: [facility];  Service: Orthopedics;  Laterality: Right;  With ANCHORS  . TOTAL KNEE ARTHROPLASTY Left 02/12/2017  . TOTAL KNEE ARTHROPLASTY Left 02/12/2017   Procedure: LEFT TOTAL KNEE ARTHROPLASTY;  Surgeon: Tarry Kos, MD;  Location: [facility];  Service: Orthopedics;  Laterality: Left;  . TOTAL KNEE REVISION Left 06/15/2018   Procedure: LEFT TOTAL KNEE REVISION;  Surgeon: Tarry Kos, MD;  Location: [facility];  Service: Orthopedics;  Laterality: Left;   Social History   Occupational History  . Occupation: Chartered certified accountant  Tobacco Use  . Smoking status: Current Every Day Smoker    Packs/day: 0.50    Years: 37.00    Pack years: 18.50    Types: Cigarettes  . Smokeless tobacco: Never Used  Substance and Sexual Activity  . Alcohol use: Not Currently  . Drug  use: No    Comment: hx of marijuana and cocaine, LSD  . Sexual activity: Yes    Partners: Female    Birth control/protection: None

## 2018-07-29 DIAGNOSIS — Z471 Aftercare following joint replacement surgery: Secondary | ICD-10-CM | POA: Diagnosis not present

## 2018-07-29 DIAGNOSIS — M25662 Stiffness of left knee, not elsewhere classified: Secondary | ICD-10-CM | POA: Diagnosis not present

## 2018-07-29 DIAGNOSIS — M25562 Pain in left knee: Secondary | ICD-10-CM | POA: Diagnosis not present

## 2018-07-29 DIAGNOSIS — M25462 Effusion, left knee: Secondary | ICD-10-CM | POA: Diagnosis not present

## 2018-08-04 DIAGNOSIS — M25562 Pain in left knee: Secondary | ICD-10-CM | POA: Diagnosis not present

## 2018-08-04 DIAGNOSIS — M25462 Effusion, left knee: Secondary | ICD-10-CM | POA: Diagnosis not present

## 2018-08-04 DIAGNOSIS — Z471 Aftercare following joint replacement surgery: Secondary | ICD-10-CM | POA: Diagnosis not present

## 2018-08-04 DIAGNOSIS — M25662 Stiffness of left knee, not elsewhere classified: Secondary | ICD-10-CM | POA: Diagnosis not present

## 2018-08-06 ENCOUNTER — Encounter: Payer: Self-pay | Admitting: Internal Medicine

## 2018-08-06 DIAGNOSIS — I10 Essential (primary) hypertension: Secondary | ICD-10-CM

## 2018-08-06 MED ORDER — AMLODIPINE BESYLATE 10 MG PO TABS: 10.0000 mg | ORAL_TABLET | Freq: Every day | ORAL | 0 refills | Status: DC

## 2018-08-06 MED ORDER — LISINOPRIL-HYDROCHLOROTHIAZIDE 20-25 MG PO TABS: 1.0000 | ORAL_TABLET | Freq: Every day | ORAL | 0 refills | Status: DC

## 2018-08-06 MED ORDER — ATORVASTATIN CALCIUM 10 MG PO TABS: 10.0000 mg | ORAL_TABLET | Freq: Every day | ORAL | 0 refills | Status: DC

## 2018-08-11 DIAGNOSIS — M25562 Pain in left knee: Secondary | ICD-10-CM | POA: Diagnosis not present

## 2018-08-11 DIAGNOSIS — Z471 Aftercare following joint replacement surgery: Secondary | ICD-10-CM | POA: Diagnosis not present

## 2018-08-11 DIAGNOSIS — M25462 Effusion, left knee: Secondary | ICD-10-CM | POA: Diagnosis not present

## 2018-08-11 DIAGNOSIS — M25662 Stiffness of left knee, not elsewhere classified: Secondary | ICD-10-CM | POA: Diagnosis not present

## 2018-08-12 DIAGNOSIS — Z471 Aftercare following joint replacement surgery: Secondary | ICD-10-CM | POA: Diagnosis not present

## 2018-08-12 DIAGNOSIS — M25662 Stiffness of left knee, not elsewhere classified: Secondary | ICD-10-CM | POA: Diagnosis not present

## 2018-08-12 DIAGNOSIS — M25462 Effusion, left knee: Secondary | ICD-10-CM | POA: Diagnosis not present

## 2018-08-12 DIAGNOSIS — M25562 Pain in left knee: Secondary | ICD-10-CM | POA: Diagnosis not present

## 2018-08-18 DIAGNOSIS — Z471 Aftercare following joint replacement surgery: Secondary | ICD-10-CM | POA: Diagnosis not present

## 2018-08-18 DIAGNOSIS — M25662 Stiffness of left knee, not elsewhere classified: Secondary | ICD-10-CM | POA: Diagnosis not present

## 2018-08-18 DIAGNOSIS — M25462 Effusion, left knee: Secondary | ICD-10-CM | POA: Diagnosis not present

## 2018-08-18 DIAGNOSIS — M25562 Pain in left knee: Secondary | ICD-10-CM | POA: Diagnosis not present

## 2018-08-20 DIAGNOSIS — M25562 Pain in left knee: Secondary | ICD-10-CM | POA: Diagnosis not present

## 2018-08-20 DIAGNOSIS — M25662 Stiffness of left knee, not elsewhere classified: Secondary | ICD-10-CM | POA: Diagnosis not present

## 2018-08-20 DIAGNOSIS — Z471 Aftercare following joint replacement surgery: Secondary | ICD-10-CM | POA: Diagnosis not present

## 2018-08-20 DIAGNOSIS — M25462 Effusion, left knee: Secondary | ICD-10-CM | POA: Diagnosis not present

## 2018-08-24 ENCOUNTER — Other Ambulatory Visit: Payer: Self-pay

## 2018-08-24 ENCOUNTER — Ambulatory Visit: Payer: BLUE CROSS/BLUE SHIELD | Attending: Internal Medicine | Admitting: Internal Medicine

## 2018-08-24 DIAGNOSIS — Z1331 Encounter for screening for depression: Secondary | ICD-10-CM | POA: Diagnosis not present

## 2018-08-24 DIAGNOSIS — Z96652 Presence of left artificial knee joint: Secondary | ICD-10-CM

## 2018-08-24 DIAGNOSIS — G4700 Insomnia, unspecified: Secondary | ICD-10-CM

## 2018-08-24 DIAGNOSIS — E785 Hyperlipidemia, unspecified: Secondary | ICD-10-CM

## 2018-08-24 DIAGNOSIS — I10 Essential (primary) hypertension: Secondary | ICD-10-CM | POA: Diagnosis not present

## 2018-08-24 MED ORDER — AMLODIPINE BESYLATE 10 MG PO TABS: 10.0000 mg | ORAL_TABLET | Freq: Every day | ORAL | 6 refills | Status: DC

## 2018-08-24 MED ORDER — LISINOPRIL-HYDROCHLOROTHIAZIDE 20-25 MG PO TABS: 1.0000 | ORAL_TABLET | Freq: Every day | ORAL | 6 refills | Status: DC

## 2018-08-24 MED ORDER — TRAZODONE HCL 50 MG PO TABS: 25.0000 mg | ORAL_TABLET | Freq: Every day | ORAL | 3 refills | Status: DC

## 2018-08-24 MED ORDER — ATORVASTATIN CALCIUM 10 MG PO TABS: 10.0000 mg | ORAL_TABLET | Freq: Every day | ORAL | 6 refills | Status: DC

## 2018-08-24 NOTE — Progress Notes (Signed)
Virtual Visit via Telephone Note  I connected with Michael [name] on 08/24/18 at 10:28 a.m by telephone from my office and verified that I am speaking with the correct person using two identifiers.  Patient is at home.   I discussed the limitations, risks, security and privacy concerns of performing an evaluation and management service by telephone and the availability of in person appointments. I also discussed with the patient that there may be a patient responsible charge related to this service. The patient expressed understanding and agreed to proceed.   History of Present Illness: Pt with hx of HTN,OA LT hip and lumbar spine,Hep C treated with interferon/rib combo in [place] 2005, tobacco abuse, substance use disorderon Methadone (states he is on it more so for pain in shoulder and knee), HL  LT knee:  Had TKR revision 06/15/18 by Dr. Roda Shutters. Still doing P.T. patient states that he is progressively getting better.  He is no longer on oxycodone.  He is back on methadone at a reduced dose of 30 mg daily.  Insomnia:  Having problems sleeping over the past few mths. Not drinking EtOH or using any street drugs.  He denies drinking any caffeinated beverages in the evenings.  He drinks coffee in the morning and sometimes a cup with lunch -worked 7 p.m to 7 a.m prior to his knee surgery in January.  Out of work since 06/15/2018 Usually gets in bed now around 9:30-10 p.m. he turns off all lights and sounds including his cell phone.  Can take 30 mins to 2 hrs to fall asleep.  Takes Melatonin and Sleepy Time Tea which helps some times -stops drinking fluids around 7 p.m because he is up voiding 2-3 times a night.  He attributes this to the lisinopril/HCTZ.  He takes this medication in the mornings. Urine flows freely and and feels that he completely empties his bladder after each void   HTN: Reports compliance with his medications including lisinopril/HCTZ, amlodipine.  He limits salt in the foods.  He  denies any chest pains or shortness of breath.  No lower extremity edema except for some swelling in the left knee but reports that this is getting better. BP check at P.T 2 x a wk - 130/78, 120/84  HL: Taking on tolerating Lipitor  Depression screen [facility] 2/9 08/24/2018 03/02/2018 08/26/2017  Decreased Interest 0 0 0  Down, Depressed, Hopeless 0 0 0  PHQ - 2 Score 0 0 0  Altered sleeping - 0 0  Tired, decreased energy - 0 0  Change in appetite - 0 0  Feeling bad or failure about yourself  - 0 0  Trouble concentrating - 0 0  Moving slowly or fidgety/restless - 0 0  Suicidal thoughts - 0 0  PHQ-9 Score - 0 0   Observations/Objective: This was a telephone encounter.  No direct physical observation was done  Assessment and Plan: 1. Insomnia, unspecified type Good sleep hygiene discussed and encourage.  Some of the things discussed including getting in bed around about the same time every night, turning off all lights and sounds once in bed, avoiding caffeinated beverages and alcohol within at least 5 hours of bedtime, getting up if unable to fall asleep after 45 minutes and doing something until the eyes feel heavy. He has tried melatonin with some but not much improvement.  He is agreeable to trying a low-dose of trazodone. He is having some polyuria but symptoms do not suggest severe BPH but we can check  a prostate exam on next office visit.  Last A1c for diabetes screen was October 2019 and it was normal. - traZODone (DESYREL) 50 MG tablet; Take 0.5 tablets (25 mg total) by mouth at bedtime.  Dispense: 15 tablet; Refill: 3  2. Essential hypertension Reported blood pressures are reasonable.  I went over blood pressure goal of 130/80 or lower.  He will continue his current blood pressure medications. - amLODipine (NORVASC) 10 MG tablet; Take 1 tablet (10 mg total) by mouth daily.  Dispense: 30 tablet; Refill: 6 - lisinopril-hydrochlorothiazide (PRINZIDE,ZESTORETIC) 20-25 MG tablet; Take 1  tablet by mouth daily.  Dispense: 30 tablet; Refill: 6  3. Hyperlipidemia, unspecified hyperlipidemia type - atorvastatin (LIPITOR) 10 MG tablet; Take 1 tablet (10 mg total) by mouth daily.  Dispense: 30 tablet; Refill: 6  4. S/P revision of total knee, left Followed by Dr. Roda Shutters.  Currently in P.T 2 x a wk   Follow Up Instructions: Patient to follow-up with me again in 2 to 3 months or sooner if any problems arise.   I discussed the assessment and treatment plan with the patient. The patient was provided an opportunity to ask questions and all were answered. The patient agreed with the plan and demonstrated an understanding of the instructions.   The patient was advised to call back or seek an in-person evaluation if the symptoms worsen or if the condition fails to improve as anticipated.  I provided 22 minutes of non-face-to-face time during this encounter.   Jonah Blue, MD

## 2018-08-25 DIAGNOSIS — Z471 Aftercare following joint replacement surgery: Secondary | ICD-10-CM | POA: Diagnosis not present

## 2018-08-25 DIAGNOSIS — M25562 Pain in left knee: Secondary | ICD-10-CM | POA: Diagnosis not present

## 2018-08-25 DIAGNOSIS — M25462 Effusion, left knee: Secondary | ICD-10-CM | POA: Diagnosis not present

## 2018-08-25 DIAGNOSIS — M25662 Stiffness of left knee, not elsewhere classified: Secondary | ICD-10-CM | POA: Diagnosis not present

## 2018-08-27 DIAGNOSIS — Z471 Aftercare following joint replacement surgery: Secondary | ICD-10-CM | POA: Diagnosis not present

## 2018-08-27 DIAGNOSIS — M25562 Pain in left knee: Secondary | ICD-10-CM | POA: Diagnosis not present

## 2018-08-27 DIAGNOSIS — M25462 Effusion, left knee: Secondary | ICD-10-CM | POA: Diagnosis not present

## 2018-08-27 DIAGNOSIS — M25662 Stiffness of left knee, not elsewhere classified: Secondary | ICD-10-CM | POA: Diagnosis not present

## 2018-09-02 DIAGNOSIS — M25662 Stiffness of left knee, not elsewhere classified: Secondary | ICD-10-CM | POA: Diagnosis not present

## 2018-09-02 DIAGNOSIS — M25562 Pain in left knee: Secondary | ICD-10-CM | POA: Diagnosis not present

## 2018-09-02 DIAGNOSIS — Z471 Aftercare following joint replacement surgery: Secondary | ICD-10-CM | POA: Diagnosis not present

## 2018-09-02 DIAGNOSIS — M25462 Effusion, left knee: Secondary | ICD-10-CM | POA: Diagnosis not present

## 2018-09-08 ENCOUNTER — Ambulatory Visit (INDEPENDENT_AMBULATORY_CARE_PROVIDER_SITE_OTHER): Payer: Self-pay

## 2018-09-08 ENCOUNTER — Encounter (INDEPENDENT_AMBULATORY_CARE_PROVIDER_SITE_OTHER): Payer: Self-pay | Admitting: Orthopaedic Surgery

## 2018-09-08 ENCOUNTER — Ambulatory Visit (INDEPENDENT_AMBULATORY_CARE_PROVIDER_SITE_OTHER): Payer: BLUE CROSS/BLUE SHIELD | Admitting: Orthopaedic Surgery

## 2018-09-08 ENCOUNTER — Other Ambulatory Visit: Payer: Self-pay

## 2018-09-08 DIAGNOSIS — M1612 Unilateral primary osteoarthritis, left hip: Secondary | ICD-10-CM | POA: Insufficient documentation

## 2018-09-08 DIAGNOSIS — Z96652 Presence of left artificial knee joint: Secondary | ICD-10-CM

## 2018-09-08 MED ORDER — METHYLPREDNISOLONE ACETATE 40 MG/ML IJ SUSP: 40.0000 mg | Freq: Once | INTRAMUSCULAR | Status: DC

## 2018-09-08 NOTE — Progress Notes (Signed)
Subjective: Patient is here for ultrasound-guided intra-articular left hip injection.  Objective: He has stiffness and pain with passive hip flexion and internal rotation.  Procedure: Ultrasound-guided left hip injection: After sterile prep with Betadine, injected 8 cc 1% lidocaine without epinephrine and 40 mg methylprednisolone using a 22-gauge spinal needle, passing the needle through the iliofemoral ligament into the femoral head/neck junction.  He had good relief during the immediate anesthetic phase.  Injectate was seen filling the joint capsule.  Follow-up as directed.

## 2018-09-08 NOTE — Progress Notes (Signed)
Office Visit Note   Patient: Michael [name]           Date of Birth: 1961/05/03           MRN: [id number] Visit Date: 09/08/2018              Requested by: Marcine Matar, MD [address] [place], [place] [address] PCP: Marcine Matar, MD   Assessment & Plan: Visit Diagnoses:  1. S/P revision of total knee, left   2. Unilateral primary osteoarthritis, left hip     Plan: Impression is status post left total knee revision and left hip osteoarthritis flareup.  In regards to the left knee, he will continue working in formal physical therapy as well as his home exercise program.  Will allow him to return to work this coming Monday with restrictions being that he is able to sit when needed.  In regards to the left hip, we will refer him to Dr. Prince Rome for another intra-articular cortisone injection.  He will follow-up with [place] in 6 weeks time for recheck.  Call with concerns or questions in the meantime.  Follow-Up Instructions: Return in about 6 weeks (around 10/20/2018).   Orders:  Orders Placed This Encounter  Procedures  . XR Knee 1-2 Views Left   No orders of the defined types were placed in this encounter.     Procedures: No procedures performed   Clinical Data: No additional findings.   Subjective: Chief Complaint  Patient presents with  . Left Knee - Follow-up    HPI patient is a pleasant 58 year old gentleman who presents our clinic today 85 days status post left total knee revision.  He has been doing well.  He is in physical therapy where he is only been making good progress.  He has minimal pain.  He is still out of work.  He does note that he is having increased pain to the left hip joint worse with ambulation.  He does have a history of left hip osteoarthritis which was injected by Dr. Alvester Morin approximately 1 year ago.  He had significant relief of symptoms until recently.  Review of Systems as detailed in HPI.  All others reviewed and are negative.   Objective: Vital Signs: There were no vitals taken for this visit.  Physical Exam well-developed and well-nourished gentleman in no acute distress.  Alert and oriented x3.  Ortho Exam examination of the left knee reveals a fully healed scar.  No evidence of infection.  Calf is soft nontender.  He is stable valgus varus stress.  Range of motion 0 to 115 degrees.  Examination of his left hip reveals a markedly positive logroll.  Positive straight leg raise.  He is neurovascularly intact distally.  Specialty Comments:  No specialty comments available.  Imaging: Xr Knee 1-2 Views Left  Result Date: 09/08/2018 X-rays of the left knee reveal a well-seated prosthesis without complication    PMFS History: Patient Active Problem List   Diagnosis Date Noted  . Unilateral primary osteoarthritis, left hip 09/08/2018  . S/P revision of total knee, left 06/15/2018  . Pain in left hip 08/14/2017  . Positive depression screening 05/22/2017  . History of left knee replacement 02/12/2017  . Hepatitis C virus infection cured after antiviral drug therapy 01/19/2017  . Unilateral primary osteoarthritis, left knee 01/02/2017  . Hyperlipidemia 10/23/2016  . Hypertension 10/18/2016  . Tobacco dependence 10/18/2016  . Chronic pain of left knee 10/18/2016  . Substance abuse in remission (  HCC) 10/18/2016  . Right rotator cuff tear 01/29/2013   Past Medical History:  Diagnosis Date  . Arthritis   . Hepatitis    Hep C rx  . Hypertension     Family History  Problem Relation Age of Onset  . CAD Mother   . Deep vein thrombosis Brother     Past Surgical History:  Procedure Laterality Date  . COLONOSCOPY    . left knee arthroscopy     . left rotator cuff surgery     . REVISION TOTAL KNEE ARTHROPLASTY Left 06/15/2018  . SHOULDER OPEN ROTATOR CUFF REPAIR Right 01/29/2013   Procedure: RIGHT SHOULDER MINI ROTATOR CUFF REPAIR;  Surgeon: Javier DockerJeffrey C Beane, MD;  Location: [facility];  Service: Orthopedics;   Laterality: Right;  With ANCHORS  . TOTAL KNEE ARTHROPLASTY Left 02/12/2017  . TOTAL KNEE ARTHROPLASTY Left 02/12/2017   Procedure: LEFT TOTAL KNEE ARTHROPLASTY;  Surgeon: Tarry KosXu, Naiping M, MD;  Location: [facility];  Service: Orthopedics;  Laterality: Left;  . TOTAL KNEE REVISION Left 06/15/2018   Procedure: LEFT TOTAL KNEE REVISION;  Surgeon: Tarry KosXu, Naiping M, MD;  Location: [facility];  Service: Orthopedics;  Laterality: Left;   Social History   Occupational History  . Occupation: Chartered certified accountantmachinist  Tobacco Use  . Smoking status: Current Every Day Smoker    Packs/day: 0.50    Years: 37.00    Pack years: 18.50    Types: Cigarettes  . Smokeless tobacco: Never Used  Substance and Sexual Activity  . Alcohol use: Not Currently  . Drug use: No    Comment: hx of marijuana and cocaine, LSD  . Sexual activity: Yes    Partners: Female    Birth control/protection: None

## 2018-09-10 DIAGNOSIS — M25662 Stiffness of left knee, not elsewhere classified: Secondary | ICD-10-CM | POA: Diagnosis not present

## 2018-09-10 DIAGNOSIS — Z471 Aftercare following joint replacement surgery: Secondary | ICD-10-CM | POA: Diagnosis not present

## 2018-09-10 DIAGNOSIS — M25562 Pain in left knee: Secondary | ICD-10-CM | POA: Diagnosis not present

## 2018-09-10 DIAGNOSIS — M25462 Effusion, left knee: Secondary | ICD-10-CM | POA: Diagnosis not present

## 2018-09-16 DIAGNOSIS — M25662 Stiffness of left knee, not elsewhere classified: Secondary | ICD-10-CM | POA: Diagnosis not present

## 2018-09-16 DIAGNOSIS — M25462 Effusion, left knee: Secondary | ICD-10-CM | POA: Diagnosis not present

## 2018-09-16 DIAGNOSIS — M25562 Pain in left knee: Secondary | ICD-10-CM | POA: Diagnosis not present

## 2018-09-16 DIAGNOSIS — Z471 Aftercare following joint replacement surgery: Secondary | ICD-10-CM | POA: Diagnosis not present

## 2018-10-20 ENCOUNTER — Ambulatory Visit (INDEPENDENT_AMBULATORY_CARE_PROVIDER_SITE_OTHER): Payer: BLUE CROSS/BLUE SHIELD | Admitting: Orthopaedic Surgery

## 2018-10-20 ENCOUNTER — Other Ambulatory Visit: Payer: Self-pay

## 2018-10-20 ENCOUNTER — Encounter: Payer: Self-pay | Admitting: Orthopaedic Surgery

## 2018-10-20 ENCOUNTER — Ambulatory Visit: Payer: Self-pay

## 2018-10-20 DIAGNOSIS — M1612 Unilateral primary osteoarthritis, left hip: Secondary | ICD-10-CM

## 2018-10-20 DIAGNOSIS — Z96652 Presence of left artificial knee joint: Secondary | ICD-10-CM

## 2018-10-20 MED ORDER — PREDNISONE 10 MG (21) PO TBPK: ORAL_TABLET | ORAL | 0 refills | Status: DC

## 2018-10-20 NOTE — Addendum Note (Signed)
Addended by: Albertina Parr on: 10/20/2018 10:24 AM   Modules accepted: Orders

## 2018-10-20 NOTE — Progress Notes (Signed)
Office Visit Note   Patient: Michael [name]           Date of Birth: Dec 26, 1960           MRN: [id number] Visit Date: 10/20/2018              Requested by: Marcine Matar, MD [address] [place], [place] [address] PCP: Marcine Matar, MD   Assessment & Plan: Visit Diagnoses:  1. Unilateral primary osteoarthritis, left hip   2. S/P revision of total knee, left     Plan: Impression is status post left total knee revision with normal components to her intraoperative findings and left hip end-stage degenerative joint disease.  His left hip DJD has certainly progressed over the last year based on x-rays.  He would like to try another injection with Dr. Alvester Morin since he had good relief from the last injection that he got from him.  He understands that he may not get relief due to the fact that his DJD has progressed.  In terms of his knee replacement this is stable and I feel that his swelling is a product of how physical his job is.  He certainly understands that having a less physically demanding job would help with his symptoms.  I have recommended that he wear thigh-high compression socks during work.  I would like to see him back in 6 weeks to recheck his knee replacement and to see how he responds to his left hip cortisone injection.  I did send in a prescription for prednisone for when he has another flareup of swelling in his knee.  Follow-Up Instructions: Return in about 6 weeks (around 12/01/2018).   Orders:  Orders Placed This Encounter  Procedures   XR HIP UNILAT W OR W/O PELVIS 2-3 VIEWS LEFT   Meds ordered this encounter  Medications   predniSONE (STERAPRED UNI-PAK 21 TAB) 10 MG (21) TBPK tablet    Sig: Take as directed    Dispense:  21 tablet    Refill:  0      Procedures: No procedures performed   Clinical Data: No additional findings.   Subjective: Chief Complaint  Patient presents with   Left Knee - Pain, Follow-up    Michael [name] is following up  today for his left knee revision for suspected loosening which turned out to be normal intraoperatively.  He is also following up for his left hip DJD.  He states that the most recent intra-articular hip injection did not give him any relief.  The previous one a year ago did give him significant relief.  He continues to have pain and discomfort in his left hip and groin region.  The left knee does tend to swell on the medial aspect of the proximal tibia after he has been working a full shift and been standing for a while.  Denies any constitutional symptoms.   Review of Systems  Constitutional: Negative.   All other systems reviewed and are negative.    Objective: Vital Signs: There were no vitals taken for this visit.  Physical Exam Vitals signs and nursing note reviewed.  Constitutional:      Appearance: He is well-developed.  Pulmonary:     Effort: Pulmonary effort is normal.  Abdominal:     Palpations: Abdomen is soft.  Skin:    General: Skin is warm.  Neurological:     Mental Status: He is alert and oriented to person, place, and time.  Psychiatric:  Behavior: Behavior normal.        Thought Content: Thought content normal.        Judgment: Judgment normal.     Ortho Exam Left knee exam shows a fully healed surgical scar.  He has minimal swelling today on exam. Left hip exam shows painful internal and external rotation.  Positive logroll.  Pain localizes to the groin and posterior buttock region. Specialty Comments:  No specialty comments available.  Imaging: Xr Hip Unilat W Or W/o Pelvis 2-3 Views Left  Result Date: 10/20/2018 Advanced bone-on-bone joint space narrowing of left hip joint with subchondral cystic formation and osteophyte formation of the superior acetabulum    PMFS History: Patient Active Problem List   Diagnosis Date Noted   Unilateral primary osteoarthritis, left hip 09/08/2018   S/P revision of total knee, left 06/15/2018   Pain in  left hip 08/14/2017   Positive depression screening 05/22/2017   History of left knee replacement 02/12/2017   Hepatitis C virus infection cured after antiviral drug therapy 01/19/2017   Unilateral primary osteoarthritis, left knee 01/02/2017   Hyperlipidemia 10/23/2016   Hypertension 10/18/2016   Tobacco dependence 10/18/2016   Chronic pain of left knee 10/18/2016   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   Right rotator cuff tear 01/29/2013   Past Medical History:  Diagnosis Date   Arthritis    Hepatitis    Hep C rx   Hypertension     Family History  Problem Relation Age of Onset   CAD Mother    Deep vein thrombosis Brother     Past Surgical History:  Procedure Laterality Date   COLONOSCOPY     left knee arthroscopy      left rotator cuff surgery      REVISION TOTAL KNEE ARTHROPLASTY Left 06/15/2018   SHOULDER OPEN ROTATOR CUFF REPAIR Right 01/29/2013   Procedure: RIGHT SHOULDER MINI ROTATOR CUFF REPAIR;  Surgeon: Javier DockerJeffrey C Beane, MD;  Location: [facility];  Service: Orthopedics;  Laterality: Right;  With ANCHORS   TOTAL KNEE ARTHROPLASTY Left 02/12/2017   TOTAL KNEE ARTHROPLASTY Left 02/12/2017   Procedure: LEFT TOTAL KNEE ARTHROPLASTY;  Surgeon: Tarry KosXu, Amberlin Utke M, MD;  Location: [facility];  Service: Orthopedics;  Laterality: Left;   TOTAL KNEE REVISION Left 06/15/2018   Procedure: LEFT TOTAL KNEE REVISION;  Surgeon: Tarry KosXu, Thuy Atilano M, MD;  Location: [facility];  Service: Orthopedics;  Laterality: Left;   Social History   Occupational History   Occupation: Chartered certified accountantmachinist  Tobacco Use   Smoking status: Current Every Day Smoker    Packs/day: 0.50    Years: 37.00    Pack years: 18.50    Types: Cigarettes   Smokeless tobacco: Never Used  Substance and Sexual Activity   Alcohol use: Not Currently   Drug use: No    Comment: hx of marijuana and cocaine, LSD   Sexual activity: Yes    Partners: Female    Birth control/protection: None

## 2018-10-25 ENCOUNTER — Telehealth: Payer: Self-pay | Admitting: Adult Health

## 2018-10-25 ENCOUNTER — Other Ambulatory Visit: Payer: Self-pay

## 2018-10-25 DIAGNOSIS — Z20822 Contact with and (suspected) exposure to covid-19: Secondary | ICD-10-CM

## 2018-10-25 NOTE — Telephone Encounter (Signed)
LEft message for a return call requiring further testing from recent ortho appointment.

## 2018-10-25 NOTE — Addendum Note (Signed)
Addended by: Amado Coe on: 10/25/2018 12:09 PM   Modules accepted: Orders

## 2018-10-25 NOTE — Telephone Encounter (Signed)
Pt returned call and pt was notified of potential exposure to an employee who later tested positive for COVID-19. Pt scheduled for testing on 10/25/18 at 2:45 pm at the [facility] site. Pt advised to remain in car and to wear mask at appt time. Understanding verbalized.

## 2018-10-26 LAB — NOVEL CORONAVIRUS, NAA: SARS-CoV-2, NAA: NOT DETECTED

## 2018-11-11 ENCOUNTER — Ambulatory Visit: Payer: Self-pay

## 2018-11-11 ENCOUNTER — Other Ambulatory Visit: Payer: Self-pay

## 2018-11-11 ENCOUNTER — Ambulatory Visit (INDEPENDENT_AMBULATORY_CARE_PROVIDER_SITE_OTHER): Payer: BC Managed Care – PPO | Admitting: Physical Medicine and Rehabilitation

## 2018-11-11 ENCOUNTER — Encounter: Payer: Self-pay | Admitting: Physical Medicine and Rehabilitation

## 2018-11-11 DIAGNOSIS — M25552 Pain in left hip: Secondary | ICD-10-CM | POA: Diagnosis not present

## 2018-11-11 NOTE — Progress Notes (Signed)
 .  Numeric Pain Rating Scale and Functional Assessment Average Pain 7   In the last MONTH (on 0-10 scale) has pain interfered with the following?  1. General activity like being  able to carry out your everyday physical activities such as walking, climbing stairs, carrying groceries, or moving a chair?  Rating(5)  -Dye Allergies.  

## 2018-11-11 NOTE — Progress Notes (Signed)
   Michael [name] - 58 y.o. male MRN [id number]  Date of birth: June 17, 1960  Office Visit Note: Visit Date: 11/11/2018 PCP: Ladell Pier, MD Referred by: Ladell Pier, MD  Subjective: Chief Complaint  Patient presents with  . Left Hand - Pain   HPI:  Michael [name] is a 58 y.o. male who comes in today At the request of Dr. Eduard Roux for left intra-articular hip injection.  Prior injection gave him quite a bit of relief about a year ago.  He does have left hip and groin pain.  ROS Otherwise per HPI.  Assessment & Plan: Visit Diagnoses:  1. Pain in left hip     Plan: No additional findings.   Meds & Orders: No orders of the defined types were placed in this encounter.   Orders Placed This Encounter  Procedures  . Large Joint Inj: L hip joint  . XR C-ARM NO REPORT    Follow-up: Return if symptoms worsen or fail to improve, for  Eduard Roux, M.D..   Procedures: Large Joint Inj: L hip joint on 11/11/2018 9:25 AM Indications: pain and diagnostic evaluation Details: 22 G needle, anterior approach  Arthrogram: Yes  Medications: 80 mg triamcinolone acetonide 40 MG/ML; 4 mL bupivacaine 0.25 % Outcome: tolerated well, no immediate complications  Arthrogram demonstrated excellent flow of contrast throughout the joint surface without extravasation or obvious defect.  The patient had relief of symptoms during the anesthetic phase of the injection.  Procedure, treatment alternatives, risks and benefits explained, specific risks discussed. Consent was given by the patient. Immediately prior to procedure a time out was called to verify the correct patient, procedure, equipment, support staff and site/side marked as required. Patient was prepped and draped in the usual sterile fashion.      No notes on file   Clinical History: No specialty comments available.     Objective:  VS:  HT:    WT:   BMI:     BP:   HR: bpm  TEMP: ( )  RESP:  Physical Exam  Ortho  Exam Imaging: No results found.

## 2018-12-01 ENCOUNTER — Ambulatory Visit: Payer: BC Managed Care – PPO

## 2018-12-01 ENCOUNTER — Encounter: Payer: Self-pay | Admitting: Orthopaedic Surgery

## 2018-12-01 ENCOUNTER — Ambulatory Visit (INDEPENDENT_AMBULATORY_CARE_PROVIDER_SITE_OTHER): Payer: BC Managed Care – PPO | Admitting: Orthopaedic Surgery

## 2018-12-01 ENCOUNTER — Other Ambulatory Visit: Payer: Self-pay

## 2018-12-01 DIAGNOSIS — M1611 Unilateral primary osteoarthritis, right hip: Secondary | ICD-10-CM

## 2018-12-01 DIAGNOSIS — Z96652 Presence of left artificial knee joint: Secondary | ICD-10-CM

## 2018-12-01 NOTE — Progress Notes (Signed)
Office Visit Note   Patient: Michael [name]           Date of Birth: 08-06-1960           MRN: [id number] Visit Date: 12/01/2018              Requested by: Ladell Pier, MD [address] [place],  [place] [address] PCP: Ladell Pier, MD   Assessment & Plan: Visit Diagnoses:  1. S/P revision of total knee, left   2. Unilateral primary osteoarthritis, right hip     Plan: Impression is #1 status post left total knee revision.  He is doing well.  He will continue to wear the compression socks for swelling.  Follow-up with [place] in 6 months time for recheck.  #2, right hip osteoarthritis.  We will refer the patient to Dr. Ernestina Patches for fluoroscopic guided cortisone injection.  He will follow-up with [place] as needed for his right hip.  Call with concerns or questions in the meantime.  Follow-Up Instructions: Return in about 6 months (around 06/03/2019) for left knee.   Orders:  Orders Placed This Encounter  Procedures  . XR Knee 1-2 Views Left   No orders of the defined types were placed in this encounter.     Procedures: No procedures performed   Clinical Data: No additional findings.   Subjective: Chief Complaint  Patient presents with  . Left Knee - Pain    HPI patient is a pleasant 58 year old gentleman who presents to our clinic today approximately 6 months status post left knee revision, 06/15/2018 following primary left total knee replacement, 02/12/2017.  He has been doing quite well.  He has finished formal physical therapy.  He does get intermittent swelling to the left lower extremity, but this is improved with compression hose.  Overall, doing very well.  He does complain of right groin pain today.  History of bilateral hip osteoarthritis.  He has had the left hip injected under ultrasound as well as fluoroscopy.  Fluoroscopic injection helped more according to the patient.  The pain he is having in the right hip is to the groin.  Worse when working and with  ambulation.  No radicular symptoms noted.  No previous cortisone injection to the right hip.  Review of Systems as detailed in HPI.  All others reviewed and are negative.   Objective: Vital Signs: There were no vitals taken for this visit.  Physical Exam well-developed well-nourished gentleman in no acute distress.  Alert and oriented x3.  Ortho Exam examination of his left knee reveals a fully healed surgical scar.  Mild swelling throughout the left lower extremity.  Calf soft nontender.  Knee range of motion 0-1 20.  Stable valgus varus stress.  Semination of his right hip reveals a positive logroll.  Negative straight leg raise.  He is neurovascular intact distally.  Specialty Comments:  No specialty comments available.  Imaging: Xr Knee 1-2 Views Left  Result Date: 12/01/2018 Well-seated prosthesis without complication    PMFS History: Patient Active Problem List   Diagnosis Date Noted  . Unilateral primary osteoarthritis, left hip 09/08/2018  . S/P revision of total knee, left 06/15/2018  . Pain in left hip 08/14/2017  . Positive depression screening 05/22/2017  . History of left knee replacement 02/12/2017  . Hepatitis C virus infection cured after antiviral drug therapy 01/19/2017  . Unilateral primary osteoarthritis, left knee 01/02/2017  . Hyperlipidemia 10/23/2016  . Hypertension 10/18/2016  . Tobacco dependence 10/18/2016  .  Chronic pain of left knee 10/18/2016  . 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 Right rotator cuff tear 01/29/2013   Past Medical History:  Diagnosis Date  . Arthritis   . Hepatitis    Hep C rx  . Hypertension     Family History  Problem Relation Age of Onset  . CAD Mother   . Deep vein thrombosis Brother     Past Surgical History:  Procedure Laterality Date  . COLONOSCOPY    . left knee arthroscopy     . left rotator cuff surgery     . REVISION TOTAL KNEE ARTHROPLASTY Left 06/15/2018  . SHOULDER OPEN ROTATOR CUFF REPAIR  Right 01/29/2013   Procedure: RIGHT SHOULDER MINI ROTATOR CUFF REPAIR;  Surgeon: Javier DockerJeffrey C Beane, MD;  Location: [facility];  Service: Orthopedics;  Laterality: Right;  With ANCHORS  . TOTAL KNEE ARTHROPLASTY Left 02/12/2017  . TOTAL KNEE ARTHROPLASTY Left 02/12/2017   Procedure: LEFT TOTAL KNEE ARTHROPLASTY;  Surgeon: Tarry KosXu, Erynne Kealey M, MD;  Location: [facility];  Service: Orthopedics;  Laterality: Left;  . TOTAL KNEE REVISION Left 06/15/2018   Procedure: LEFT TOTAL KNEE REVISION;  Surgeon: Tarry KosXu, Russell Engelstad M, MD;  Location: [facility];  Service: Orthopedics;  Laterality: Left;   Social History   Occupational History  . Occupation: Chartered certified accountantmachinist  Tobacco Use  . Smoking status: Current Every Day Smoker    Packs/day: 0.50    Years: 37.00    Pack years: 18.50    Types: Cigarettes  . Smokeless tobacco: Never Used  Substance and Sexual Activity  . Alcohol use: Not Currently  . Drug use: No    Comment: hx of marijuana and cocaine, LSD  . Sexual activity: Yes    Partners: Female    Birth control/protection: None

## 2018-12-21 ENCOUNTER — Encounter: Payer: Self-pay | Admitting: Physical Medicine and Rehabilitation

## 2018-12-21 ENCOUNTER — Ambulatory Visit: Payer: Self-pay

## 2018-12-21 ENCOUNTER — Ambulatory Visit (INDEPENDENT_AMBULATORY_CARE_PROVIDER_SITE_OTHER): Payer: BC Managed Care – PPO | Admitting: Physical Medicine and Rehabilitation

## 2018-12-21 VITALS — BP 148/85 | HR 85

## 2018-12-21 DIAGNOSIS — M25552 Pain in left hip: Secondary | ICD-10-CM | POA: Diagnosis not present

## 2018-12-21 DIAGNOSIS — M25551 Pain in right hip: Secondary | ICD-10-CM

## 2018-12-21 MED ORDER — BUPIVACAINE HCL 0.25 % IJ SOLN
4.0000 mL | INTRAMUSCULAR | Status: AC | PRN
  Administered 2018-11-11: 4 mL via INTRA_ARTICULAR

## 2018-12-21 MED ORDER — TRIAMCINOLONE ACETONIDE 40 MG/ML IJ SUSP
80.0000 mg | INTRAMUSCULAR | Status: AC | PRN
  Administered 2018-12-21: 80 mg via INTRA_ARTICULAR

## 2018-12-21 MED ORDER — BUPIVACAINE HCL 0.25 % IJ SOLN
4.0000 mL | INTRAMUSCULAR | Status: AC | PRN
  Administered 2018-12-21: 09:00:00 4 mL via INTRA_ARTICULAR

## 2018-12-21 MED ORDER — TRIAMCINOLONE ACETONIDE 40 MG/ML IJ SUSP
80.0000 mg | INTRAMUSCULAR | Status: AC | PRN
  Administered 2018-11-11: 80 mg via INTRA_ARTICULAR

## 2018-12-21 NOTE — Addendum Note (Signed)
Addended by: Raymondo Band on: 12/21/2018 09:02 AM   Modules accepted: Orders

## 2018-12-21 NOTE — Progress Notes (Signed)
   [name] - 58 y.o. male MRN [id number]  Date of birth: 1960-11-12  Office Visit Note: Visit Date: 12/21/2018 PCP: Ladell Pier, MD Referred by: Ladell Pier, MD  Subjective: Chief Complaint  Patient presents with  . Right Hip - Pain   HPI:  Michael [name] is a 58 y.o. male who comes in today At the request of Dr. Eduard Roux for diagnostic and therapeutic right anesthetic hip arthrogram on the right.  Patient's had prior left sided injections with good relief.  Last time we saw him was in June for his left hip.  He is having right hip and groin pain.  We will go and complete diagnostic injection today.  ROS Otherwise per HPI.  Assessment & Plan: Visit Diagnoses:  1. Pain in left hip     Plan: No additional findings.   Meds & Orders: No orders of the defined types were placed in this encounter.   Orders Placed This Encounter  Procedures  . Large Joint Inj: R hip joint    Follow-up: Return if symptoms worsen or fail to improve.   Procedures: Large Joint Inj: R hip joint on 12/21/2018 8:43 AM Indications: pain and diagnostic evaluation Details: 22 G needle, anterior approach  Arthrogram: Yes  Medications: 4 mL bupivacaine 0.25 %; 80 mg triamcinolone acetonide 40 MG/ML Outcome: tolerated well, no immediate complications  Arthrogram demonstrated excellent flow of contrast throughout the joint surface without extravasation or obvious defect.  The patient had relief of symptoms during the anesthetic phase of the injection.  Procedure, treatment alternatives, risks and benefits explained, specific risks discussed. Consent was given by the patient. Immediately prior to procedure a time out was called to verify the correct patient, procedure, equipment, support staff and site/side marked as required. Patient was prepped and draped in the usual sterile fashion.      No notes on file   Clinical History: No specialty comments available.     Objective:   VS:  HT:    WT:   BMI:     BP:(!) 148/85  HR:85bpm  TEMP: ( )  RESP:98 % Physical Exam  Ortho Exam Imaging: No results found.

## 2018-12-21 NOTE — Progress Notes (Signed)
Numeric Pain Rating Scale and Functional Assessment Average Pain 7   In the last MONTH (on 0-10 scale) has pain interfered with the following?  1. General activity like being  able to carry out your everyday physical activities such as walking, climbing stairs, carrying groceries, or moving a chair?  Rating(7)    -BT, -Dye Allergies.

## 2018-12-27 ENCOUNTER — Other Ambulatory Visit: Payer: Self-pay | Admitting: Internal Medicine

## 2018-12-27 DIAGNOSIS — G4700 Insomnia, unspecified: Secondary | ICD-10-CM

## 2019-01-22 ENCOUNTER — Other Ambulatory Visit: Payer: Self-pay | Admitting: Internal Medicine

## 2019-01-22 DIAGNOSIS — G4700 Insomnia, unspecified: Secondary | ICD-10-CM

## 2019-01-26 ENCOUNTER — Ambulatory Visit: Payer: BC Managed Care – PPO | Attending: Family Medicine | Admitting: Physician Assistant

## 2019-01-26 ENCOUNTER — Other Ambulatory Visit: Payer: Self-pay

## 2019-01-26 DIAGNOSIS — M25552 Pain in left hip: Secondary | ICD-10-CM | POA: Diagnosis not present

## 2019-01-26 DIAGNOSIS — M25551 Pain in right hip: Secondary | ICD-10-CM

## 2019-01-26 DIAGNOSIS — I10 Essential (primary) hypertension: Secondary | ICD-10-CM | POA: Diagnosis not present

## 2019-01-26 DIAGNOSIS — E785 Hyperlipidemia, unspecified: Secondary | ICD-10-CM

## 2019-01-26 DIAGNOSIS — G4700 Insomnia, unspecified: Secondary | ICD-10-CM | POA: Diagnosis not present

## 2019-01-26 MED ORDER — LISINOPRIL-HYDROCHLOROTHIAZIDE 20-25 MG PO TABS: 1.0000 | ORAL_TABLET | Freq: Every day | ORAL | 6 refills | Status: DC

## 2019-01-26 MED ORDER — TRAZODONE HCL 50 MG PO TABS: 25.0000 mg | ORAL_TABLET | Freq: Every evening | ORAL | 2 refills | Status: DC | PRN

## 2019-01-26 MED ORDER — PREDNISONE 10 MG (21) PO TBPK: ORAL_TABLET | ORAL | 0 refills | Status: DC

## 2019-01-26 MED ORDER — ATORVASTATIN CALCIUM 10 MG PO TABS: 10.0000 mg | ORAL_TABLET | Freq: Every day | ORAL | 6 refills | Status: DC

## 2019-01-26 MED ORDER — AMLODIPINE BESYLATE 10 MG PO TABS: 10.0000 mg | ORAL_TABLET | Freq: Every day | ORAL | 6 refills | Status: DC

## 2019-01-26 NOTE — Progress Notes (Signed)
Virtual Visit via Telephone Note  I connected with Michael [name] on 01/26/19 at 10:50 AM EDT by telephone and verified that I am speaking with the correct person using two identifiers.   I discussed the limitations, risks, security and privacy concerns of performing an evaluation and management service by telephone and the availability of in person appointments. I also discussed with the patient that there may be a patient responsible charge related to this service. The patient expressed understanding and agreed to proceed.  Patient location:  home My Location:  [place] office Persons on the call:  Me and the patient   History of Present Illness:  Needs RF on trazadone.  25-50mg  work well.  BP running 120-150/80-90.  No HA/CP/dizziness.  Numbers are good more often than not.    Needs hip replacement.  Wants rx prednisone.  He hasn't had to take any in >6 months.      Observations/Objective:  NAD.  A&Ox3   Assessment and Plan: 1. Essential hypertension suboptimal control - lisinopril-hydrochlorothiazide (ZESTORETIC) 20-25 MG tablet; Take 1 tablet by mouth daily.  Dispense: 30 tablet; Refill: 6 - amLODipine (NORVASC) 10 MG tablet; Take 1 tablet (10 mg total) by mouth daily.  Dispense: 30 tablet; Refill: 6  2. Pain of both hip joints - predniSONE (STERAPRED UNI-PAK 21 TAB) 10 MG (21) TBPK tablet; Take as directed  Dispense: 21 tablet; Refill: 0  3. Insomnia, unspecified type - traZODone (DESYREL) 50 MG tablet; Take 0.5 tablets (25 mg total) by mouth at bedtime as needed for sleep.  Dispense: 30 tablet; Refill: 2  4. Hyperlipidemia, unspecified hyperlipidemia type - atorvastatin (LIPITOR) 10 MG tablet; Take 1 tablet (10 mg total) by mouth daily.  Dispense: 30 tablet; Refill: 6   Follow Up Instructions: See PCP in 3 months   I discussed the assessment and treatment plan with the patient. The patient was provided an opportunity to ask questions and all were answered. The patient  agreed with the plan and demonstrated an understanding of the instructions.   The patient was advised to call back or seek an in-person evaluation if the symptoms worsen or if the condition fails to improve as anticipated.  I provided 12 minutes of non-face-to-face time during this encounter.   Freeman Caldron, PA-C  Patient ID: Michael [name], male   DOB: Oct 19, 1960, 58 y.o.   MRN: [id number]

## 2019-02-05 ENCOUNTER — Telehealth: Payer: Self-pay | Admitting: Internal Medicine

## 2019-02-05 NOTE — Telephone Encounter (Signed)
-----   Message from Debbra Riding, RMA sent at 01/26/2019 10:29 AM EDT ----- Regarding: schedule appt 3 months with Dr. Wynetta Emery for chronic conditions

## 2019-02-05 NOTE — Telephone Encounter (Signed)
Patient has been scheduled

## 2019-02-05 NOTE — Telephone Encounter (Signed)
LVM to schedule follow up

## 2019-02-16 ENCOUNTER — Ambulatory Visit (INDEPENDENT_AMBULATORY_CARE_PROVIDER_SITE_OTHER): Payer: BC Managed Care – PPO | Admitting: Orthopaedic Surgery

## 2019-02-16 ENCOUNTER — Other Ambulatory Visit: Payer: Self-pay

## 2019-02-16 ENCOUNTER — Ambulatory Visit: Payer: Self-pay

## 2019-02-16 DIAGNOSIS — Z96652 Presence of left artificial knee joint: Secondary | ICD-10-CM | POA: Diagnosis not present

## 2019-02-16 NOTE — Progress Notes (Signed)
Office Visit Note   Patient: Michael [name]           Date of Birth: 1960/11/19           MRN: [id number] Visit Date: 02/16/2019              Requested by: Ladell Pier, MD [address] [place],  [place] [address] PCP: Ladell Pier, MD   Assessment & Plan: Visit Diagnoses:  1. S/P revision of total knee, left     Plan: X-rays today reveal loosening of the tibial component with varus subsidence.  The femoral component appears to be intact.  Patient is having a lot of pain with this therefore we will need to revise a tibial component to a cemented stemmed 1.  We will obtain blood work today to rule out infection.  We will schedule surgery ASAP per his request.  We will keep him out of work indefinitely until he has recovered from this neck surgery.  Questions encouraged and answered.  Risks, possible complications discussed with the patient today.  He is willing to proceed with surgery.  Follow-Up Instructions: Return for 2 week postop visit.   Orders:  Orders Placed This Encounter  Procedures  . XR Knee 1-2 Views Left  . Sed Rate (ESR)  . C-reactive protein  . CBC with Differential   No orders of the defined types were placed in this encounter.     Procedures: No procedures performed   Clinical Data: No additional findings.   Subjective: Chief Complaint  Patient presents with  . Left Knee - Pain    Michael [name] is a 58 year old gentleman comes in for evaluation of continued left sided medial knee pain.  He originally underwent an uncemented left total knee replacement 2 years ago.  He did well for the first year or so and then began having pain which ultimately resulted in [place] taking him back to the operating room to check the implants which did not show any loosening or complications intraoperatively.  His work-up was negative for infection.  Unfortunately he has continued to have pain over the last 8 months since he had the surgery and his pain is mainly on  the medial side it is worse with weightbearing and standing and walking.  He is unable to work as a result of the severe pain.  Denies any constitutional symptoms.   Review of Systems  Constitutional: Negative.   All other systems reviewed and are negative.    Objective: Vital Signs: There were no vitals taken for this visit.  Physical Exam Vitals signs and nursing note reviewed.  Constitutional:      Appearance: He is well-developed.  Pulmonary:     Effort: Pulmonary effort is normal.  Abdominal:     Palpations: Abdomen is soft.  Skin:    General: Skin is warm.  Neurological:     Mental Status: He is alert and oriented to person, place, and time.  Psychiatric:        Behavior: Behavior normal.        Thought Content: Thought content normal.        Judgment: Judgment normal.     Ortho Exam Left knee exam shows a fully healed surgical scar.  There is a slight varus deformity.  No redness or warmth or signs of infection. Specialty Comments:  No specialty comments available.  Imaging: Xr Knee 1-2 Views Left  Result Date: 02/16/2019 Subsidence and loosening of the tibial component into  varus.  Femoral component appears to be without complication.    PMFS History: Patient Active Problem List   Diagnosis Date Noted  . Unilateral primary osteoarthritis, left hip 09/08/2018  . S/P revision of total knee, left 06/15/2018  . Pain in left hip 08/14/2017  . Positive depression screening 05/22/2017  . History of left knee replacement 02/12/2017  . Hepatitis C virus infection cured after antiviral drug therapy 01/19/2017  . Unilateral primary osteoarthritis, left knee 01/02/2017  . Hyperlipidemia 10/23/2016  . Hypertension 10/18/2016  . Tobacco dependence 10/18/2016  . Chronic pain of left knee 10/18/2016  . Substance abuse in remission ([place]) 10/18/2016  . Right rotator cuff tear 01/29/2013   Past Medical History:  Diagnosis Date  . Arthritis   . Hepatitis    Hep C  rx  . Hypertension     Family History  Problem Relation Age of Onset  . CAD Mother   . Deep vein thrombosis Brother     Past Surgical History:  Procedure Laterality Date  . COLONOSCOPY    . left knee arthroscopy     . left rotator cuff surgery     . REVISION TOTAL KNEE ARTHROPLASTY Left 06/15/2018  . SHOULDER OPEN ROTATOR CUFF REPAIR Right 01/29/2013   Procedure: RIGHT SHOULDER MINI ROTATOR CUFF REPAIR;  Surgeon: Johnn Hai, MD;  Location: [facility];  Service: Orthopedics;  Laterality: Right;  With ANCHORS  . TOTAL KNEE ARTHROPLASTY Left 02/12/2017  . TOTAL KNEE ARTHROPLASTY Left 02/12/2017   Procedure: LEFT TOTAL KNEE ARTHROPLASTY;  Surgeon: Leandrew Koyanagi, MD;  Location: [facility];  Service: Orthopedics;  Laterality: Left;  . TOTAL KNEE REVISION Left 06/15/2018   Procedure: LEFT TOTAL KNEE REVISION;  Surgeon: Leandrew Koyanagi, MD;  Location: [facility];  Service: Orthopedics;  Laterality: Left;   Social History   Occupational History  . Occupation: Furniture conservator/restorer  Tobacco Use  . Smoking status: Current Every Day Smoker    Packs/day: 0.50    Years: 37.00    Pack years: 18.50    Types: Cigarettes  . Smokeless tobacco: Never Used  Substance and Sexual Activity  . Alcohol use: Not Currently  . Drug use: No    Comment: hx of marijuana and cocaine, LSD  . Sexual activity: Yes    Partners: Female    Birth control/protection: None

## 2019-02-17 ENCOUNTER — Telehealth: Payer: Self-pay | Admitting: Orthopaedic Surgery

## 2019-02-17 ENCOUNTER — Other Ambulatory Visit: Payer: Self-pay | Admitting: Physician Assistant

## 2019-02-17 LAB — CBC WITH DIFFERENTIAL/PLATELET
Absolute Monocytes: 680 cells/uL (ref 200–950)
Basophils Absolute: 59 cells/uL (ref 0–200)
Basophils Relative: 0.7 %
Eosinophils Absolute: 160 cells/uL (ref 15–500)
Eosinophils Relative: 1.9 %
HCT: 39.9 % (ref 38.5–50.0)
Hemoglobin: 13.6 g/dL (ref 13.2–17.1)
Lymphs Abs: 1579 cells/uL (ref 850–3900)
MCH: 30.2 pg (ref 27.0–33.0)
MCHC: 34.1 g/dL (ref 32.0–36.0)
MCV: 88.5 fL (ref 80.0–100.0)
MPV: 11.3 fL (ref 7.5–12.5)
Monocytes Relative: 8.1 %
Neutro Abs: 5922 cells/uL (ref 1500–7800)
Neutrophils Relative %: 70.5 %
Platelets: 285 10*3/uL (ref 140–400)
RBC: 4.51 10*6/uL (ref 4.20–5.80)
RDW: 13.4 % (ref 11.0–15.0)
Total Lymphocyte: 18.8 %
WBC: 8.4 10*3/uL (ref 3.8–10.8)

## 2019-02-17 LAB — SEDIMENTATION RATE: Sed Rate: 22 mm/h — ABNORMAL HIGH (ref 0–20)

## 2019-02-17 LAB — C-REACTIVE PROTEIN: CRP: 3.6 mg/L (ref <8.0)

## 2019-02-17 MED ORDER — OXYCODONE-ACETAMINOPHEN 5-325 MG PO TABS: 1.0000 | ORAL_TABLET | Freq: Every day | ORAL | 0 refills | Status: DC | PRN

## 2019-02-17 NOTE — Telephone Encounter (Signed)
Tramadol 50mg  1-2 tid prn pain #30, 1 refill

## 2019-02-17 NOTE — Telephone Encounter (Signed)
Pt is s/p a left total knee revision 05/2018 and is asking for a refill on his Oxycodone. Please advise. Was in the office yesterday.

## 2019-02-17 NOTE — Telephone Encounter (Signed)
Yeah that's fine #20

## 2019-02-17 NOTE — Telephone Encounter (Signed)
I called patient and advised. 

## 2019-02-17 NOTE — Telephone Encounter (Signed)
What do you think I should give him?

## 2019-02-17 NOTE — Telephone Encounter (Signed)
I just called in a rx.  Please tell him to use sparingly

## 2019-02-17 NOTE — Telephone Encounter (Signed)
Please advise 

## 2019-02-17 NOTE — Telephone Encounter (Signed)
I called patient to advise. He states that he has already discussed with [place] that Tramadol does not work for him at all. He is requesting something stronger for pain.

## 2019-02-17 NOTE — Telephone Encounter (Signed)
ok 

## 2019-02-17 NOTE — Telephone Encounter (Signed)
Patient called requesting prescription of Oxycodone to be sent to [facility] [address].

## 2019-02-17 NOTE — Telephone Encounter (Signed)
Pt called in said he forgot to ask at his appt yesterday if you could prescribe him something for his left knee pain.  Please have that [facility] to [facility] [address].   [phone]

## 2019-02-25 ENCOUNTER — Other Ambulatory Visit: Payer: Self-pay

## 2019-03-01 NOTE — Progress Notes (Addendum)
[id number] [place], [place] - [address] [place] [address] Phone: [phone] Fax: [phone]  [facility] - [place], [place] - [address] [place] [address] Phone: [phone] Fax: [phone]      Your procedure is scheduled on October 12th.  Report to [facility] Main Entrance "A" at 0800 A.M., and check in at the Admitting office.  Call this number if you have problems the morning of surgery:  [phone]  Call [phone] if you have any questions prior to your surgery date Monday-Friday 8am-4pm    Remember:  Do not eat after midnight the night before your surgery  You may drink clear liquids until 0700 AM the morning of your surgery.   Clear liquids allowed are: Water, Non-Citrus Juices (without pulp), Carbonated Beverages, Clear Tea, Black Coffee Only, and Gatorade   Please complete your PRE-SURGERY ENSURE that was provided to you by 0700 AM the morning of surgery.  Please, if able, drink it in one setting. DO NOT SIP.    Take these medicines the morning of surgery with A SIP OF WATER:   amLODipine (NORVASC)  atorvastatin (LIPITOR)  methadone (DOLOPHINE)   IF NEEDED: ondansetron (ZOFRAN), oxyCODONE-acetaminophen (PERCOCET)  As of today, STOP taking any Aspirin (unless otherwise instructed by your surgeon), Aleve, Naproxen, Ibuprofen, Motrin, Advil, Goody's, BC's, all herbal medications, fish oil, and all vitamins.    The Morning of Surgery  Do not wear jewelry.  Do not wear lotions, powders, colognes, or deodorant  Men may shave face and neck.  Do not bring valuables to the hospital.  [facility] is not responsible for any belongings or valuables.  If you are a smoker, DO NOT Smoke 24 hours prior to surgery IF you wear a CPAP at night please bring your mask, tubing, and machine the morning of surgery   Remember that you must have someone to transport you home after your  surgery, and remain with you for 24 hours if you are discharged the same day.   Contacts, glasses, hearing aids, dentures or bridgework may not be worn into surgery.    Leave your suitcase in the car.  After surgery it may be brought to your room.  For patients admitted to the hospital, discharge time will be determined by your treatment team.  Patients discharged the day of surgery will not be allowed to drive home.    Special instructions:   [facility]- Preparing For Surgery  Before surgery, you can play an important role. Because skin is not sterile, your skin needs to be as free of germs as possible. You can reduce the number of germs on your skin by washing with CHG (chlorahexidine gluconate) Soap before surgery.  CHG is an antiseptic cleaner which kills germs and bonds with the skin to continue killing germs even after washing.    Oral Hygiene is also important to reduce your risk of infection.  Remember - BRUSH YOUR TEETH THE MORNING OF SURGERY WITH YOUR REGULAR TOOTHPASTE  Please do not use if you have an allergy to CHG or antibacterial soaps. If your skin becomes reddened/irritated stop using the CHG.  Do not shave (including legs and underarms) for at least 48 hours prior to first CHG shower. It is OK to shave your face.  Please follow these instructions carefully.   1. Shower the NIGHT BEFORE SURGERY and the MORNING OF SURGERY with CHG Soap.   2. If you chose  to wash your hair, wash your hair first as usual with your normal shampoo.  3. After you shampoo, rinse your hair and body thoroughly to remove the shampoo.  4. Use CHG as you would any other liquid soap. You can apply CHG directly to the skin and wash gently with a scrungie or a clean washcloth.   5. Apply the CHG Soap to your body ONLY FROM THE NECK DOWN.  Do not use on open wounds or open sores. Avoid contact with your eyes, ears, mouth and genitals (private parts). Wash Face and genitals (private parts)  with  your normal soap.   6. Wash thoroughly, paying special attention to the area where your surgery will be performed.  7. Thoroughly rinse your body with warm water from the neck down.  8. DO NOT shower/wash with your normal soap after using and rinsing off the CHG Soap.  9. Pat yourself dry with a CLEAN TOWEL.  10. Wear CLEAN PAJAMAS to bed the night before surgery, wear comfortable clothes the morning of surgery  11. Place CLEAN SHEETS on your bed the night of your first shower and DO NOT SLEEP WITH PETS.    Day of Surgery:  Do not apply any deodorants/lotions. Please shower the morning of surgery with the CHG soap  Please wear clean clothes to the hospital/surgery center.   Remember to brush your teeth WITH YOUR REGULAR TOOTHPASTE.   Please read over the following fact sheets that you were given.

## 2019-03-02 ENCOUNTER — Encounter (HOSPITAL_COMMUNITY)
Admission: RE | Admit: 2019-03-02 | Discharge: 2019-03-02 | Disposition: A | Discharge status: Home / Self Care | Payer: BC Managed Care – PPO | Source: Ambulatory Visit | Attending: Orthopaedic Surgery | Admitting: Orthopaedic Surgery

## 2019-03-02 ENCOUNTER — Other Ambulatory Visit: Payer: Self-pay

## 2019-03-02 ENCOUNTER — Encounter (HOSPITAL_COMMUNITY): Payer: Self-pay

## 2019-03-02 ENCOUNTER — Ambulatory Visit (HOSPITAL_COMMUNITY)
Admission: RE | Admit: 2019-03-02 | Discharge: 2019-03-02 | Disposition: A | Discharge status: Home / Self Care | Payer: BC Managed Care – PPO | Source: Ambulatory Visit | Attending: Physician Assistant | Admitting: Physician Assistant

## 2019-03-02 DIAGNOSIS — Z01818 Encounter for other preprocedural examination: Secondary | ICD-10-CM | POA: Diagnosis not present

## 2019-03-02 DIAGNOSIS — M25562 Pain in left knee: Secondary | ICD-10-CM | POA: Insufficient documentation

## 2019-03-02 LAB — COMPREHENSIVE METABOLIC PANEL
ALT: 28 U/L (ref 0–44)
AST: 24 U/L (ref 15–41)
Albumin: 3.9 g/dL (ref 3.5–5.0)
Alkaline Phosphatase: 80 U/L (ref 38–126)
Anion gap: 10 (ref 5–15)
BUN: 13 mg/dL (ref 6–20)
CO2: 28 mmol/L (ref 22–32)
Calcium: 9.4 mg/dL (ref 8.9–10.3)
Chloride: 99 mmol/L (ref 98–111)
Creatinine, Ser: 0.89 mg/dL (ref 0.61–1.24)
GFR calc Af Amer: >60 mL/min (ref >60)
GFR calc non Af Amer: >60 mL/min (ref >60)
Glucose, Bld: 89 mg/dL (ref 70–99)
Potassium: 4.2 mmol/L (ref 3.5–5.1)
Sodium: 137 mmol/L (ref 135–145)
Total Bilirubin: 0.9 mg/dL (ref 0.3–1.2)
Total Protein: 6.9 g/dL (ref 6.5–8.1)

## 2019-03-02 LAB — CBC WITH DIFFERENTIAL/PLATELET
Abs Immature Granulocytes: 0.05 10*3/uL (ref 0.00–0.07)
Basophils Absolute: 0.1 10*3/uL (ref 0.0–0.1)
Basophils Relative: 1 %
Eosinophils Absolute: 0.2 10*3/uL (ref 0.0–0.5)
Eosinophils Relative: 2 %
HCT: 43.6 % (ref 39.0–52.0)
Hemoglobin: 14.7 g/dL (ref 13.0–17.0)
Immature Granulocytes: 1 %
Lymphocytes Relative: 20 %
Lymphs Abs: 1.7 10*3/uL (ref 0.7–4.0)
MCH: 31 pg (ref 26.0–34.0)
MCHC: 33.7 g/dL (ref 30.0–36.0)
MCV: 92 fL (ref 80.0–100.0)
Monocytes Absolute: 0.7 10*3/uL (ref 0.1–1.0)
Monocytes Relative: 8 %
Neutro Abs: 5.8 10*3/uL (ref 1.7–7.7)
Neutrophils Relative %: 68 %
Platelets: 231 10*3/uL (ref 150–400)
RBC: 4.74 MIL/uL (ref 4.22–5.81)
RDW: 13.2 % (ref 11.5–15.5)
WBC: 8.4 10*3/uL (ref 4.0–10.5)
nRBC: 0 % (ref 0.0–0.2)

## 2019-03-02 LAB — SURGICAL PCR SCREEN
MRSA, PCR: NEGATIVE
Staphylococcus aureus: NEGATIVE

## 2019-03-02 LAB — TYPE AND SCREEN
ABO/RH(D): B POS
Antibody Screen: NEGATIVE

## 2019-03-02 LAB — PROTIME-INR
INR: 1 (ref 0.8–1.2)
Prothrombin Time: 13.1 seconds (ref 11.4–15.2)

## 2019-03-02 LAB — APTT: aPTT: 32 seconds (ref 24–36)

## 2019-03-02 NOTE — Progress Notes (Signed)
PCP - Karle Plumber, MD Cardiologist - Denies  PPM/ICD - N/A Device Orders - N/A Rep Notified N/A  Chest x-ray - 03/02/2019 EKG - 06/11/2018 Stress Test - Denies ECHO - Denies Cardiac Cath - Denies  Sleep Study - Denies CPAP - N/A  Fasting Blood Sugar - N/A Checks Blood Sugar __N/A___ times a day  Blood Thinner Instructions: N/A Aspirin Instructions: N/A  ERAS Protcol - Yes PRE-SURGERY Ensure - Yes  COVID TEST- 03/03/2001   Anesthesia review: No  Patient denies shortness of breath, fever, cough and chest pain at PAT appointment   Coronavirus Screening  Have you experienced the following symptoms:  Cough yes/no: No Fever (>100.34F)  yes/no: No Runny nose yes/no: No Sore throat yes/no: No Difficulty breathing/shortness of breath  yes/no: No  Have you or a family member traveled in the last 14 days and where? yes/no: No   If the patient indicates "YES" to the above questions, their PAT will be rescheduled to limit the exposure to others and, the surgeon will be notified. THE PATIENT WILL NEED TO BE ASYMPTOMATIC FOR 14 DAYS.   If the patient is not experiencing any of these symptoms, the PAT nurse will instruct them to NOT bring anyone with them to their appointment since they may have these symptoms or traveled as well.   Please remind your patients and families that hospital visitation restrictions are in effect and the importance of the restrictions.     Patient verbalized understanding of instructions that were given to them at the PAT appointment. Patient was also instructed that they will need to review over the PAT instructions again at home before surgery.

## 2019-03-04 ENCOUNTER — Other Ambulatory Visit (HOSPITAL_COMMUNITY)
Admission: RE | Admit: 2019-03-04 | Discharge: 2019-03-04 | Disposition: A | Discharge status: Home / Self Care | Payer: BC Managed Care – PPO | Source: Ambulatory Visit | Attending: Orthopaedic Surgery | Admitting: Orthopaedic Surgery

## 2019-03-04 DIAGNOSIS — Z01812 Encounter for preprocedural laboratory examination: Secondary | ICD-10-CM | POA: Insufficient documentation

## 2019-03-04 DIAGNOSIS — Z20828 Contact with and (suspected) exposure to other viral communicable diseases: Secondary | ICD-10-CM | POA: Diagnosis not present

## 2019-03-05 LAB — NOVEL CORONAVIRUS, NAA (HOSP ORDER, SEND-OUT TO REF LAB; TAT 18-24 HRS): SARS-CoV-2, NAA: NOT DETECTED

## 2019-03-05 MED ORDER — BUPIVACAINE LIPOSOME 1.3 % IJ SUSP
20.0000 mL | INTRAMUSCULAR | Status: DC
  Filled 2019-03-05: qty 20

## 2019-03-05 MED ORDER — TRANEXAMIC ACID-NACL 1000-0.7 MG/100ML-% IV SOLN
1000.0000 mg | INTRAVENOUS | Status: DC
  Filled 2019-03-05: qty 100

## 2019-03-05 MED ORDER — TRANEXAMIC ACID 1000 MG/10ML IV SOLN
2000.0000 mg | INTRAVENOUS | Status: AC
  Administered 2019-03-08: 12:00:00 2000 mg via TOPICAL
  Filled 2019-03-05: qty 20

## 2019-03-08 ENCOUNTER — Encounter (HOSPITAL_COMMUNITY): Payer: Self-pay | Admitting: Surgery

## 2019-03-08 ENCOUNTER — Inpatient Hospital Stay (HOSPITAL_COMMUNITY)
Admission: RE | Admit: 2019-03-08 | Discharge: 2019-03-09 | DRG: 468 | Disposition: A | Discharge status: Home Health | Payer: BC Managed Care – PPO | Attending: Orthopaedic Surgery | Admitting: Orthopaedic Surgery

## 2019-03-08 ENCOUNTER — Other Ambulatory Visit: Payer: Self-pay

## 2019-03-08 ENCOUNTER — Encounter (HOSPITAL_COMMUNITY)
Admission: RE | Disposition: A | Discharge status: Home Health | Payer: Self-pay | Source: Home / Self Care | Attending: Orthopaedic Surgery

## 2019-03-08 ENCOUNTER — Inpatient Hospital Stay (HOSPITAL_COMMUNITY): Payer: BC Managed Care – PPO | Admitting: Anesthesiology

## 2019-03-08 ENCOUNTER — Inpatient Hospital Stay (HOSPITAL_COMMUNITY): Payer: BC Managed Care – PPO

## 2019-03-08 DIAGNOSIS — F17210 Nicotine dependence, cigarettes, uncomplicated: Secondary | ICD-10-CM | POA: Diagnosis present

## 2019-03-08 DIAGNOSIS — Z96652 Presence of left artificial knee joint: Secondary | ICD-10-CM

## 2019-03-08 DIAGNOSIS — T84033A Mechanical loosening of internal left knee prosthetic joint, initial encounter: Principal | ICD-10-CM

## 2019-03-08 DIAGNOSIS — I10 Essential (primary) hypertension: Secondary | ICD-10-CM | POA: Diagnosis present

## 2019-03-08 DIAGNOSIS — Z79899 Other long term (current) drug therapy: Secondary | ICD-10-CM | POA: Diagnosis not present

## 2019-03-08 DIAGNOSIS — G8918 Other acute postprocedural pain: Secondary | ICD-10-CM | POA: Diagnosis not present

## 2019-03-08 DIAGNOSIS — E785 Hyperlipidemia, unspecified: Secondary | ICD-10-CM | POA: Diagnosis not present

## 2019-03-08 DIAGNOSIS — Z888 Allergy status to other drugs, medicaments and biological substances status: Secondary | ICD-10-CM | POA: Diagnosis not present

## 2019-03-08 DIAGNOSIS — Z471 Aftercare following joint replacement surgery: Secondary | ICD-10-CM | POA: Diagnosis not present

## 2019-03-08 DIAGNOSIS — Z79891 Long term (current) use of opiate analgesic: Secondary | ICD-10-CM

## 2019-03-08 DIAGNOSIS — Y792 Prosthetic and other implants, materials and accessory orthopedic devices associated with adverse incidents: Secondary | ICD-10-CM | POA: Diagnosis present

## 2019-03-08 DIAGNOSIS — Z8249 Family history of ischemic heart disease and other diseases of the circulatory system: Secondary | ICD-10-CM

## 2019-03-08 HISTORY — PX: TOTAL KNEE REVISION: SHX996

## 2019-03-08 SURGERY — TOTAL KNEE REVISION
Anesthesia: Spinal | Site: Knee | Laterality: Left

## 2019-03-08 MED ORDER — OXYCODONE HCL 5 MG PO TABS
ORAL_TABLET | ORAL | Status: AC
  Filled 2019-03-08: qty 2

## 2019-03-08 MED ORDER — DIPHENHYDRAMINE HCL 12.5 MG/5ML PO ELIX: 25.0000 mg | ORAL_SOLUTION | ORAL | Status: DC | PRN

## 2019-03-08 MED ORDER — METHOCARBAMOL 1000 MG/10ML IJ SOLN
500.0000 mg | Freq: Four times a day (QID) | INTRAVENOUS | Status: DC | PRN
  Filled 2019-03-08: qty 5

## 2019-03-08 MED ORDER — CEFAZOLIN SODIUM-DEXTROSE 2-4 GM/100ML-% IV SOLN
2.0000 g | INTRAVENOUS | Status: AC
  Administered 2019-03-08: 2 g via INTRAVENOUS
  Filled 2019-03-08: qty 100

## 2019-03-08 MED ORDER — PHENOL 1.4 % MT LIQD: 1.0000 | OROMUCOSAL | Status: DC | PRN

## 2019-03-08 MED ORDER — HYDROCHLOROTHIAZIDE 25 MG PO TABS
25.0000 mg | ORAL_TABLET | Freq: Every day | ORAL | Status: DC
  Administered 2019-03-09: 10:00:00 25 mg via ORAL
  Filled 2019-03-08: qty 1

## 2019-03-08 MED ORDER — FENTANYL CITRATE (PF) 100 MCG/2ML IJ SOLN
100.0000 ug | Freq: Once | INTRAMUSCULAR | Status: AC
  Administered 2019-03-08: 09:00:00 100 ug via INTRAVENOUS

## 2019-03-08 MED ORDER — SODIUM CHLORIDE 0.9 % IV SOLN
INTRAVENOUS | Status: DC | PRN
  Administered 2019-03-08: 40 ug/min via INTRAVENOUS

## 2019-03-08 MED ORDER — CHLORHEXIDINE GLUCONATE 4 % EX LIQD: 60.0000 mL | Freq: Once | CUTANEOUS | Status: DC

## 2019-03-08 MED ORDER — ONDANSETRON HCL 4 MG/2ML IJ SOLN
INTRAMUSCULAR | Status: AC
  Filled 2019-03-08: qty 2

## 2019-03-08 MED ORDER — SODIUM CHLORIDE 0.9% FLUSH
INTRAVENOUS | Status: DC | PRN
  Administered 2019-03-08: 20 mL

## 2019-03-08 MED ORDER — AMLODIPINE BESYLATE 10 MG PO TABS
10.0000 mg | ORAL_TABLET | Freq: Every day | ORAL | Status: DC
  Administered 2019-03-09: 10:00:00 10 mg via ORAL
  Filled 2019-03-08: qty 1

## 2019-03-08 MED ORDER — LIDOCAINE 2% (20 MG/ML) 5 ML SYRINGE
INTRAMUSCULAR | Status: AC
  Filled 2019-03-08: qty 5

## 2019-03-08 MED ORDER — KETOROLAC TROMETHAMINE 15 MG/ML IJ SOLN
30.0000 mg | Freq: Four times a day (QID) | INTRAMUSCULAR | Status: AC
  Administered 2019-03-08 – 2019-03-09 (×4): 30 mg via INTRAVENOUS
  Filled 2019-03-08 (×3): qty 2

## 2019-03-08 MED ORDER — PHENYLEPHRINE 40 MCG/ML (10ML) SYRINGE FOR IV PUSH (FOR BLOOD PRESSURE SUPPORT)
PREFILLED_SYRINGE | INTRAVENOUS | Status: AC
  Filled 2019-03-08: qty 10

## 2019-03-08 MED ORDER — VANCOMYCIN HCL 1000 MG IV SOLR
INTRAVENOUS | Status: AC
  Filled 2019-03-08: qty 1000

## 2019-03-08 MED ORDER — METOCLOPRAMIDE HCL 5 MG/ML IJ SOLN: 5.0000 mg | Freq: Three times a day (TID) | INTRAMUSCULAR | Status: DC | PRN

## 2019-03-08 MED ORDER — TRANEXAMIC ACID-NACL 1000-0.7 MG/100ML-% IV SOLN
INTRAVENOUS | Status: DC | PRN
  Administered 2019-03-08: 1000 mg via INTRAVENOUS

## 2019-03-08 MED ORDER — MIDAZOLAM HCL 2 MG/2ML IJ SOLN
INTRAMUSCULAR | Status: AC
  Filled 2019-03-08: qty 2

## 2019-03-08 MED ORDER — SORBITOL 70 % SOLN: 30.0000 mL | Freq: Every day | Status: DC | PRN

## 2019-03-08 MED ORDER — ACETAMINOPHEN 325 MG PO TABS: 325.0000 mg | ORAL_TABLET | Freq: Four times a day (QID) | ORAL | Status: DC | PRN

## 2019-03-08 MED ORDER — CEFAZOLIN SODIUM-DEXTROSE 2-4 GM/100ML-% IV SOLN
2.0000 g | Freq: Four times a day (QID) | INTRAVENOUS | Status: AC
  Administered 2019-03-08 – 2019-03-09 (×3): 2 g via INTRAVENOUS
  Filled 2019-03-08 (×3): qty 100

## 2019-03-08 MED ORDER — MIDAZOLAM HCL 2 MG/2ML IJ SOLN
2.0000 mg | Freq: Once | INTRAMUSCULAR | Status: AC
  Administered 2019-03-08: 09:00:00 2 mg via INTRAVENOUS

## 2019-03-08 MED ORDER — PROPOFOL 10 MG/ML IV BOLUS
INTRAVENOUS | Status: DC | PRN
  Administered 2019-03-08: 30 mg via INTRAVENOUS

## 2019-03-08 MED ORDER — MIDAZOLAM HCL 2 MG/2ML IJ SOLN
INTRAMUSCULAR | Status: AC
  Administered 2019-03-08: 2 mg via INTRAVENOUS
  Filled 2019-03-08: qty 2

## 2019-03-08 MED ORDER — ACETAMINOPHEN 500 MG PO TABS
1000.0000 mg | ORAL_TABLET | Freq: Once | ORAL | Status: AC
  Administered 2019-03-08: 08:00:00 1000 mg via ORAL
  Filled 2019-03-08: qty 2

## 2019-03-08 MED ORDER — INFLUENZA VAC SPLIT QUAD 0.5 ML IM SUSY: 0.5000 mL | PREFILLED_SYRINGE | INTRAMUSCULAR | Status: DC

## 2019-03-08 MED ORDER — FENTANYL CITRATE (PF) 100 MCG/2ML IJ SOLN
25.0000 ug | INTRAMUSCULAR | Status: DC | PRN
  Administered 2019-03-08 (×2): 50 ug via INTRAVENOUS

## 2019-03-08 MED ORDER — PROMETHAZINE HCL 25 MG/ML IJ SOLN: 6.2500 mg | INTRAMUSCULAR | Status: DC | PRN

## 2019-03-08 MED ORDER — DOCUSATE SODIUM 100 MG PO CAPS
100.0000 mg | ORAL_CAPSULE | Freq: Two times a day (BID) | ORAL | Status: DC
  Administered 2019-03-08 – 2019-03-09 (×2): 100 mg via ORAL
  Filled 2019-03-08 (×2): qty 1

## 2019-03-08 MED ORDER — FENTANYL CITRATE (PF) 250 MCG/5ML IJ SOLN
INTRAMUSCULAR | Status: AC
  Filled 2019-03-08: qty 5

## 2019-03-08 MED ORDER — OXYCODONE HCL 5 MG PO TABS: 10.0000 mg | ORAL_TABLET | ORAL | Status: DC | PRN

## 2019-03-08 MED ORDER — ATORVASTATIN CALCIUM 10 MG PO TABS
10.0000 mg | ORAL_TABLET | Freq: Every day | ORAL | Status: DC
  Administered 2019-03-09: 10:00:00 10 mg via ORAL
  Filled 2019-03-08: qty 1

## 2019-03-08 MED ORDER — TRAZODONE HCL 50 MG PO TABS: 25.0000 mg | ORAL_TABLET | Freq: Every evening | ORAL | Status: DC | PRN

## 2019-03-08 MED ORDER — LISINOPRIL 20 MG PO TABS
20.0000 mg | ORAL_TABLET | Freq: Every day | ORAL | Status: DC
  Administered 2019-03-09: 20 mg via ORAL
  Filled 2019-03-08: qty 1

## 2019-03-08 MED ORDER — BUPIVACAINE LIPOSOME 1.3 % IJ SUSP
INTRAMUSCULAR | Status: DC | PRN
  Administered 2019-03-08: 20 mL

## 2019-03-08 MED ORDER — KETOROLAC TROMETHAMINE 15 MG/ML IJ SOLN
INTRAMUSCULAR | Status: AC
  Filled 2019-03-08: qty 1

## 2019-03-08 MED ORDER — BUPIVACAINE-EPINEPHRINE 0.25% -1:200000 IJ SOLN
INTRAMUSCULAR | Status: DC | PRN
  Administered 2019-03-08: 20 mL

## 2019-03-08 MED ORDER — MIDAZOLAM HCL 5 MG/5ML IJ SOLN
INTRAMUSCULAR | Status: DC | PRN
  Administered 2019-03-08: 2 mg via INTRAVENOUS

## 2019-03-08 MED ORDER — OXYCODONE HCL 5 MG PO TABS
5.0000 mg | ORAL_TABLET | ORAL | Status: DC | PRN
  Administered 2019-03-08 (×2): 10 mg via ORAL
  Filled 2019-03-08 (×2): qty 2

## 2019-03-08 MED ORDER — METOCLOPRAMIDE HCL 5 MG PO TABS: 5.0000 mg | ORAL_TABLET | Freq: Three times a day (TID) | ORAL | Status: DC | PRN

## 2019-03-08 MED ORDER — ASPIRIN 81 MG PO CHEW
81.0000 mg | CHEWABLE_TABLET | Freq: Two times a day (BID) | ORAL | Status: DC
  Administered 2019-03-08 – 2019-03-09 (×2): 81 mg via ORAL
  Filled 2019-03-08 (×2): qty 1

## 2019-03-08 MED ORDER — ONDANSETRON HCL 4 MG/2ML IJ SOLN
INTRAMUSCULAR | Status: DC | PRN
  Administered 2019-03-08: 4 mg via INTRAVENOUS

## 2019-03-08 MED ORDER — DEXAMETHASONE SODIUM PHOSPHATE 4 MG/ML IJ SOLN
INTRAMUSCULAR | Status: DC | PRN
  Administered 2019-03-08: 8 mg via INTRAVENOUS

## 2019-03-08 MED ORDER — ROPIVACAINE HCL 7.5 MG/ML IJ SOLN
INTRAMUSCULAR | Status: DC | PRN
  Administered 2019-03-08: 20 mL via PERINEURAL

## 2019-03-08 MED ORDER — LIDOCAINE 2% (20 MG/ML) 5 ML SYRINGE
INTRAMUSCULAR | Status: DC | PRN
  Administered 2019-03-08: 50 mg via INTRAVENOUS

## 2019-03-08 MED ORDER — CLONIDINE HCL (ANALGESIA) 100 MCG/ML EP SOLN
EPIDURAL | Status: DC | PRN
  Administered 2019-03-08: 100 ug

## 2019-03-08 MED ORDER — BUPIVACAINE-EPINEPHRINE (PF) 0.25% -1:200000 IJ SOLN
INTRAMUSCULAR | Status: AC
  Filled 2019-03-08: qty 20

## 2019-03-08 MED ORDER — MAGNESIUM CITRATE PO SOLN: 1.0000 | Freq: Once | ORAL | Status: DC | PRN

## 2019-03-08 MED ORDER — LISINOPRIL-HYDROCHLOROTHIAZIDE 20-25 MG PO TABS: 1.0000 | ORAL_TABLET | Freq: Every day | ORAL | Status: DC

## 2019-03-08 MED ORDER — ONDANSETRON HCL 4 MG/2ML IJ SOLN: 4.0000 mg | Freq: Four times a day (QID) | INTRAMUSCULAR | Status: DC | PRN

## 2019-03-08 MED ORDER — BUPIVACAINE LIPOSOME 1.3 % IJ SUSP
20.0000 mL | Freq: Once | INTRAMUSCULAR | Status: DC
  Filled 2019-03-08: qty 20

## 2019-03-08 MED ORDER — PROPOFOL 10 MG/ML IV BOLUS
INTRAVENOUS | Status: AC
  Filled 2019-03-08: qty 20

## 2019-03-08 MED ORDER — PSYLLIUM 95 % PO PACK
1.0000 | PACK | Freq: Every day | ORAL | Status: DC
  Filled 2019-03-08: qty 1

## 2019-03-08 MED ORDER — PROPOFOL 500 MG/50ML IV EMUL
INTRAVENOUS | Status: DC | PRN
  Administered 2019-03-08: 12:00:00 via INTRAVENOUS
  Administered 2019-03-08: 75 ug/kg/min via INTRAVENOUS

## 2019-03-08 MED ORDER — ONDANSETRON HCL 4 MG PO TABS: 4.0000 mg | ORAL_TABLET | Freq: Four times a day (QID) | ORAL | Status: DC | PRN

## 2019-03-08 MED ORDER — ALUM & MAG HYDROXIDE-SIMETH 200-200-20 MG/5ML PO SUSP: 30.0000 mL | ORAL | Status: DC | PRN

## 2019-03-08 MED ORDER — CELECOXIB 200 MG PO CAPS
200.0000 mg | ORAL_CAPSULE | Freq: Two times a day (BID) | ORAL | Status: DC
  Administered 2019-03-08 – 2019-03-09 (×2): 200 mg via ORAL
  Filled 2019-03-08 (×2): qty 1

## 2019-03-08 MED ORDER — OXYCODONE HCL ER 10 MG PO T12A
10.0000 mg | EXTENDED_RELEASE_TABLET | Freq: Two times a day (BID) | ORAL | Status: DC
  Administered 2019-03-08 – 2019-03-09 (×3): 10 mg via ORAL
  Filled 2019-03-08 (×3): qty 1

## 2019-03-08 MED ORDER — TRANEXAMIC ACID-NACL 1000-0.7 MG/100ML-% IV SOLN
1000.0000 mg | Freq: Once | INTRAVENOUS | Status: AC
  Administered 2019-03-08: 16:00:00 1000 mg via INTRAVENOUS
  Filled 2019-03-08: qty 100

## 2019-03-08 MED ORDER — DEXAMETHASONE SODIUM PHOSPHATE 10 MG/ML IJ SOLN
INTRAMUSCULAR | Status: AC
  Filled 2019-03-08: qty 1

## 2019-03-08 MED ORDER — ACETAMINOPHEN 500 MG PO TABS
1000.0000 mg | ORAL_TABLET | Freq: Four times a day (QID) | ORAL | Status: AC
  Administered 2019-03-08 – 2019-03-09 (×3): 1000 mg via ORAL
  Filled 2019-03-08 (×3): qty 2

## 2019-03-08 MED ORDER — POVIDONE-IODINE 10 % EX SWAB: 2.0000 "application " | Freq: Once | CUTANEOUS | Status: DC

## 2019-03-08 MED ORDER — POLYETHYLENE GLYCOL 3350 17 G PO PACK: 17.0000 g | PACK | Freq: Every day | ORAL | Status: DC | PRN

## 2019-03-08 MED ORDER — SODIUM CHLORIDE 0.9 % IV SOLN
INTRAVENOUS | Status: DC
  Administered 2019-03-08: 18:00:00 via INTRAVENOUS

## 2019-03-08 MED ORDER — PHENYLEPHRINE 40 MCG/ML (10ML) SYRINGE FOR IV PUSH (FOR BLOOD PRESSURE SUPPORT)
PREFILLED_SYRINGE | INTRAVENOUS | Status: DC | PRN
  Administered 2019-03-08: 80 ug via INTRAVENOUS

## 2019-03-08 MED ORDER — METHOCARBAMOL 500 MG PO TABS
ORAL_TABLET | ORAL | Status: AC
  Filled 2019-03-08: qty 1

## 2019-03-08 MED ORDER — MENTHOL 3 MG MT LOZG: 1.0000 | LOZENGE | OROMUCOSAL | Status: DC | PRN

## 2019-03-08 MED ORDER — GABAPENTIN 300 MG PO CAPS
300.0000 mg | ORAL_CAPSULE | Freq: Three times a day (TID) | ORAL | Status: DC
  Administered 2019-03-08 – 2019-03-09 (×3): 300 mg via ORAL
  Filled 2019-03-08 (×3): qty 1

## 2019-03-08 MED ORDER — OXYCODONE-ACETAMINOPHEN 5-325 MG PO TABS: 1.0000 | ORAL_TABLET | Freq: Every day | ORAL | Status: DC | PRN

## 2019-03-08 MED ORDER — BUPIVACAINE HCL (PF) 0.25 % IJ SOLN
INTRAMUSCULAR | Status: AC
  Filled 2019-03-08: qty 30

## 2019-03-08 MED ORDER — DEXAMETHASONE SODIUM PHOSPHATE 10 MG/ML IJ SOLN
10.0000 mg | Freq: Once | INTRAMUSCULAR | Status: AC
  Administered 2019-03-09: 10:00:00 10 mg via INTRAVENOUS
  Filled 2019-03-08: qty 1

## 2019-03-08 MED ORDER — LACTATED RINGERS IV SOLN
INTRAVENOUS | Status: DC
  Administered 2019-03-08: 09:00:00 via INTRAVENOUS

## 2019-03-08 MED ORDER — FENTANYL CITRATE (PF) 100 MCG/2ML IJ SOLN
INTRAMUSCULAR | Status: AC
  Administered 2019-03-08: 09:00:00 100 ug via INTRAVENOUS
  Filled 2019-03-08: qty 2

## 2019-03-08 MED ORDER — SODIUM CHLORIDE 0.9 % IR SOLN
Status: DC | PRN
  Administered 2019-03-08: 3000 mL
  Administered 2019-03-08: 1000 mL

## 2019-03-08 MED ORDER — LACTATED RINGERS IV SOLN
INTRAVENOUS | Status: DC | PRN
  Administered 2019-03-08 (×2): via INTRAVENOUS

## 2019-03-08 MED ORDER — BUPIVACAINE IN DEXTROSE 0.75-8.25 % IT SOLN
INTRATHECAL | Status: DC | PRN
  Administered 2019-03-08: 1.6 mg via INTRATHECAL

## 2019-03-08 MED ORDER — FENTANYL CITRATE (PF) 100 MCG/2ML IJ SOLN
INTRAMUSCULAR | Status: AC
  Filled 2019-03-08: qty 2

## 2019-03-08 MED ORDER — HYDROMORPHONE HCL 1 MG/ML IJ SOLN
0.5000 mg | INTRAMUSCULAR | Status: DC | PRN
  Administered 2019-03-08 (×2): 1 mg via INTRAVENOUS
  Filled 2019-03-08 (×2): qty 1

## 2019-03-08 MED ORDER — METHOCARBAMOL 500 MG PO TABS
500.0000 mg | ORAL_TABLET | Freq: Four times a day (QID) | ORAL | Status: DC | PRN
  Administered 2019-03-08 (×2): 500 mg via ORAL
  Filled 2019-03-08 (×2): qty 1

## 2019-03-08 MED ORDER — METHADONE HCL 10 MG PO TABS
80.0000 mg | ORAL_TABLET | Freq: Every day | ORAL | Status: DC
  Administered 2019-03-09: 10:00:00 80 mg via ORAL
  Filled 2019-03-08: qty 8

## 2019-03-08 SURGICAL SUPPLY — 73 items
ADH SKN CLS APL DERMABOND .7 (GAUZE/BANDAGES/DRESSINGS) ×1
AUG TIB EF 5 HLF BLCK STRL LL (Joint) ×1 IMPLANT
AUG TIB EF 5 HLF BLCK STRL LM (Joint) ×1 IMPLANT
AUGMENT TIB TI 5 SZ EF INSIDE (Joint) ×2 IMPLANT
AUGMENT TIB TI 5 SZ EF OUTSIDE (Joint) ×2 IMPLANT
BLADE CLIPPER SURG (BLADE) IMPLANT
BLADE SAG 18X100X1.27 (BLADE) ×3 IMPLANT
BLADE SAGITTAL 25.0X1.27X90 (BLADE) ×2 IMPLANT
BLADE SAGITTAL 25.0X1.27X90MM (BLADE) ×1
BNDG CMPR MED 10X6 ELC LF (GAUZE/BANDAGES/DRESSINGS) ×1
BNDG ELASTIC 6X10 VLCR STRL LF (GAUZE/BANDAGES/DRESSINGS) ×2 IMPLANT
BOWL SMART MIX CTS (DISPOSABLE) IMPLANT
CEMENT BONE REFOBACIN R1X40 US (Cement) ×4 IMPLANT
COMPONENT TIB KNEE SZ E UNC (Knees) ×2 IMPLANT
COVER SURGICAL LIGHT HANDLE (MISCELLANEOUS) ×3 IMPLANT
COVER WAND RF STERILE (DRAPES) ×1 IMPLANT
CUFF TOURN SGL QUICK 34 (TOURNIQUET CUFF)
CUFF TOURN SGL QUICK 42 (TOURNIQUET CUFF) IMPLANT
CUFF TRNQT CYL 34X4.125X (TOURNIQUET CUFF) IMPLANT
DERMABOND ADVANCED (GAUZE/BANDAGES/DRESSINGS) ×2
DERMABOND ADVANCED .7 DNX12 (GAUZE/BANDAGES/DRESSINGS) IMPLANT
DRAPE ORTHO SPLIT 77X108 STRL (DRAPES) ×6
DRAPE POUCH INSTRU U-SHP 10X18 (DRAPES) ×3 IMPLANT
DRAPE SURG ORHT 6 SPLT 77X108 (DRAPES) ×2 IMPLANT
DRAPE U-SHAPE 47X51 STRL (DRAPES) ×3 IMPLANT
DRSG PAD ABDOMINAL 8X10 ST (GAUZE/BANDAGES/DRESSINGS) ×4 IMPLANT
DURAPREP 26ML APPLICATOR (WOUND CARE) ×8 IMPLANT
ELECT CAUTERY BLADE 6.4 (BLADE) ×3 IMPLANT
ELECT REM PT RETURN 9FT ADLT (ELECTROSURGICAL) ×3
ELECTRODE REM PT RTRN 9FT ADLT (ELECTROSURGICAL) ×1 IMPLANT
GAUZE SPONGE 4X4 12PLY STRL (GAUZE/BANDAGES/DRESSINGS) ×2 IMPLANT
GAUZE XEROFORM 5X9 LF (GAUZE/BANDAGES/DRESSINGS) ×2 IMPLANT
GLOVE BIOGEL PI IND STRL 7.0 (GLOVE) ×1 IMPLANT
GLOVE BIOGEL PI INDICATOR 7.0 (GLOVE) ×2
GLOVE ECLIPSE 7.0 STRL STRAW (GLOVE) ×3 IMPLANT
GLOVE SKINSENSE NS SZ7.5 (GLOVE) ×4
GLOVE SKINSENSE STRL SZ7.5 (GLOVE) ×2 IMPLANT
GOWN STRL REIN XL XLG (GOWN DISPOSABLE) ×6 IMPLANT
HANDPIECE INTERPULSE COAX TIP (DISPOSABLE) ×3
HOOD PEEL AWAY FLYTE STAYCOOL (MISCELLANEOUS) ×6 IMPLANT
INSERT TIBIAL SZ EF/6-9 18 LT (Insert) ×2 IMPLANT
JET LAVAGE IRRISEPT WOUND (IRRIGATION / IRRIGATOR) ×3
KIT BASIN OR (CUSTOM PROCEDURE TRAY) ×3 IMPLANT
KIT TURNOVER KIT B (KITS) ×3 IMPLANT
LAVAGE JET IRRISEPT WOUND (IRRIGATION / IRRIGATOR) IMPLANT
MANIFOLD NEPTUNE II (INSTRUMENTS) ×3 IMPLANT
MARKER SKIN DUAL TIP RULER LAB (MISCELLANEOUS) ×3 IMPLANT
NDL SPNL 18GX3.5 QUINCKE PK (NEEDLE) ×1 IMPLANT
NEEDLE SPNL 18GX3.5 QUINCKE PK (NEEDLE) ×3 IMPLANT
NS IRRIG 1000ML POUR BTL (IV SOLUTION) ×3 IMPLANT
PACK TOTAL JOINT (CUSTOM PROCEDURE TRAY) ×3 IMPLANT
PAD ARMBOARD 7.5X6 YLW CONV (MISCELLANEOUS) ×6 IMPLANT
PAD CAST 4YDX4 CTTN HI CHSV (CAST SUPPLIES) IMPLANT
PADDING CAST COTTON 4X4 STRL (CAST SUPPLIES) ×6
SAW OSC TIP CART 19.5X105X1.3 (SAW) ×3 IMPLANT
SET HNDPC FAN SPRY TIP SCT (DISPOSABLE) IMPLANT
STEM FEM KNEE 135X12 (Stem) ×2 IMPLANT
SUCTION FRAZIER HANDLE 10FR (MISCELLANEOUS) ×2
SUCTION TUBE FRAZIER 10FR DISP (MISCELLANEOUS) ×1 IMPLANT
SUT ETHILON 2 0 FS 18 (SUTURE) ×4 IMPLANT
SUT MNCRL AB 3-0 PS2 27 (SUTURE) ×3 IMPLANT
SUT VIC AB 0 CT1 27 (SUTURE) ×3
SUT VIC AB 0 CT1 27XBRD ANBCTR (SUTURE) ×2 IMPLANT
SUT VIC AB 1 CTX 36 (SUTURE) ×12
SUT VIC AB 1 CTX36XBRD ANBCTR (SUTURE) ×2 IMPLANT
SUT VIC AB 1 CTX36XBRD ANBCTRL (SUTURE) IMPLANT
SUT VIC AB 2-0 CT1 27 (SUTURE) ×3
SUT VIC AB 2-0 CT1 TAPERPNT 27 (SUTURE) ×2 IMPLANT
SYR 20ML LL LF (SYRINGE) ×3 IMPLANT
SYR 50ML SLIP (SYRINGE) ×3 IMPLANT
TOWEL GREEN STERILE (TOWEL DISPOSABLE) ×3 IMPLANT
TOWEL GREEN STERILE FF (TOWEL DISPOSABLE) ×3 IMPLANT
TRAY FOLEY MTR SLVR 16FR STAT (SET/KITS/TRAYS/PACK) IMPLANT

## 2019-03-08 NOTE — Anesthesia Procedure Notes (Signed)
Spinal  Patient location during procedure: OR Start time: 03/08/2019 10:00 AM End time: 03/08/2019 10:05 AM Staffing Anesthesiologist: Catalina Gravel, MD Performed: anesthesiologist  Preanesthetic Checklist Completed: patient identified, surgical consent, pre-op evaluation, timeout performed, IV checked, risks and benefits discussed and monitors and equipment checked Spinal Block Patient position: sitting Prep: site prepped and draped and DuraPrep Patient monitoring: continuous pulse ox and blood pressure Approach: midline Location: L3-4 Injection technique: single-shot Needle Needle type: Pencan  Needle gauge: 24 G Additional Notes Functioning IV was confirmed and monitors were applied. Sterile prep and drape, including hand hygiene, mask and sterile gloves were used. The patient was positioned and the spine was prepped. The skin was anesthetized with lidocaine.  Free flow of clear CSF was obtained prior to injecting local anesthetic into the CSF.  The spinal needle aspirated freely following injection.  The needle was carefully withdrawn.  The patient tolerated the procedure well. Consent was obtained prior to procedure with all questions answered and concerns addressed. Risks including but not limited to bleeding, infection, nerve damage, paralysis, failed block, inadequate analgesia, allergic reaction, high spinal, itching and headache were discussed and the patient wished to proceed.   Hoy Morn, MD

## 2019-03-08 NOTE — Progress Notes (Signed)
Orthopedic Tech Progress Note Patient Details:  Michael [name] [birth year] [id number]      Post Interventions Patient Tolerated: Well Instructions Provided: Care of device   [name] 03/08/2019, 1:10 PM

## 2019-03-08 NOTE — Evaluation (Signed)
Physical Therapy Evaluation Patient Details Name: Michael [name] MRN: [id number] DOB: Dec 04, 1960 Today's Date: 03/08/2019   History of Present Illness  Pt is a 58 y/o male s/p L total knee revision. PMH includes hep C, HTN, and L TKA.   Clinical Impression  Pt is s/p surgery above with deficits below. Pt tolerated gait training well and required min guard A for mobility using RW. Educated about knee precautions, however, pt very familiar with precautions given previous surgeries. Will continue to follow acutely to maximize functional mobility independence and safety.     Follow Up Recommendations Follow surgeon's recommendation for DC plan and follow-up therapies    Equipment Recommendations  Rolling walker with 5" wheels    Recommendations for Other Services       Precautions / Restrictions Precautions Precautions: Knee Precaution Booklet Issued: Yes (comment) Precaution Comments: Reviewed knee precautions with pt.  Restrictions Weight Bearing Restrictions: Yes LLE Weight Bearing: Weight bearing as tolerated      Mobility  Bed Mobility Overal bed mobility: Needs Assistance Bed Mobility: Supine to Sit     Supine to sit: Supervision     General bed mobility comments: Supervision for safety.   Transfers Overall transfer level: Needs assistance Equipment used: Rolling walker (2 wheeled) Transfers: Sit to/from Stand Sit to Stand: Min guard         General transfer comment: Min guard for safety. Cues for safe hand placement.   Ambulation/Gait Ambulation/Gait assistance: Min guard Gait Distance (Feet): 100 Feet Assistive device: Rolling walker (2 wheeled) Gait Pattern/deviations: Step-to pattern;Decreased step length - right;Decreased step length - left;Decreased weight shift to left;Antalgic;Trunk flexed Gait velocity: Decreased   General Gait Details: Slow, antalgic gait. Cues to push RW vs picking up between steps. Also required cues for upright posture.    Stairs            Wheelchair Mobility    Modified Rankin (Stroke Patients Only)       Balance Overall balance assessment: Needs assistance Sitting-balance support: No upper extremity supported;Feet supported Sitting balance-Leahy Scale: Good     Standing balance support: Bilateral upper extremity supported;During functional activity Standing balance-Leahy Scale: Poor Standing balance comment: Reliant on BUE support                              Pertinent Vitals/Pain Pain Assessment: 0-10 Pain Score: 6  Pain Location: L knee  Pain Descriptors / Indicators: Aching;Operative site guarding Pain Intervention(s): Limited activity within patient's tolerance;Monitored during session;Repositioned    Home Living Family/patient expects to be discharged to:: Private residence Living Arrangements: Non-relatives/Friends Available Help at Discharge: Friend(s);Available PRN/intermittently Type of Home: House Home Access: Stairs to enter Entrance Stairs-Rails: None Entrance Stairs-Number of Steps: 2 Home Layout: One level Home Equipment: Bedside commode;Cane - single point      Prior Function Level of Independence: Independent               Hand Dominance        Extremity/Trunk Assessment   Upper Extremity Assessment Upper Extremity Assessment: Overall WFL for tasks assessed    Lower Extremity Assessment Lower Extremity Assessment: LLE deficits/detail LLE Deficits / Details: Deficits consistent with post op pain and weakness.     Cervical / Trunk Assessment Cervical / Trunk Assessment: Normal  Communication   Communication: No difficulties  Cognition Arousal/Alertness: Awake/alert Behavior During Therapy: WFL for tasks assessed/performed Overall Cognitive Status: Within Functional Limits for tasks assessed  General Comments      Exercises     Assessment/Plan    PT Assessment  Patient needs continued PT services  PT Problem List Decreased strength;Decreased range of motion;Decreased balance;Decreased mobility;Decreased knowledge of use of DME;Pain       PT Treatment Interventions DME instruction;Gait training;Stair training;Functional mobility training;Therapeutic activities;Therapeutic exercise;Balance training;Patient/family education    PT Goals (Current goals can be found in the Care Plan section)  Acute Rehab PT Goals Patient Stated Goal: to go home PT Goal Formulation: With patient Time For Goal Achievement: 03/22/19 Potential to Achieve Goals: Good    Frequency 7X/week   Barriers to discharge        Co-evaluation               AM-PAC PT "6 Clicks" Mobility  Outcome Measure Help needed turning from your back to your side while in a flat bed without using bedrails?: None Help needed moving from lying on your back to sitting on the side of a flat bed without using bedrails?: None Help needed moving to and from a bed to a chair (including a wheelchair)?: A Little Help needed standing up from a chair using your arms (e.g., wheelchair or bedside chair)?: A Little Help needed to walk in hospital room?: A Little Help needed climbing 3-5 steps with a railing? : A Little 6 Click Score: 20    End of Session Equipment Utilized During Treatment: Gait belt Activity Tolerance: Patient tolerated treatment well Patient left: in chair;with call bell/phone within reach Nurse Communication: Mobility status PT Visit Diagnosis: Other abnormalities of gait and mobility (R26.89);Pain Pain - Right/Left: Left Pain - part of body: Knee    Time: 1722-1746 PT Time Calculation (min) (ACUTE ONLY): 24 min   Charges:   PT Evaluation $PT Eval Low Complexity: 1 Low PT Treatments $Gait Training: 8-22 mins        Michael [name], PT, DPT  Acute Rehabilitation Services  Pager: [phone] Office: [phone]   Michael [name] 03/08/2019,  6:19 PM

## 2019-03-08 NOTE — Transfer of Care (Signed)
Immediate Anesthesia Transfer of Care Note  Patient: [facility] Comp  Procedure(s) Performed: LEFT TOTAL KNEE REVISION (Left Knee)  Patient Location: PACU  Anesthesia Type:Regional and Spinal  Level of Consciousness: awake, oriented and patient cooperative  Airway & Oxygen Therapy: Patient Spontanous Breathing and Patient connected to nasal cannula oxygen  Post-op Assessment: Report given to RN and Post -op Vital signs reviewed and stable  Post vital signs: Reviewed and stable  Last Vitals:  Vitals Value Taken Time  BP 120/84 03/08/19 1300  Temp    Pulse 84 03/08/19 1301  Resp 16 03/08/19 1301  SpO2 99 % 03/08/19 1301  Vitals shown include unvalidated device data.  Last Pain:  Vitals:   03/08/19 0832  PainSc: 0-No pain      Patients Stated Pain Goal: 5 (63/84/66 5993)  Complications: No apparent anesthesia complications

## 2019-03-08 NOTE — Anesthesia Procedure Notes (Signed)
Anesthesia Regional Block: Adductor canal block   Pre-Anesthetic Checklist: ,, timeout performed, Correct Patient, Correct Site, Correct Laterality, Correct Procedure, Correct Position, site marked, Risks and benefits discussed,  Surgical consent,  Pre-op evaluation,  At surgeon's request and post-op pain management  Laterality: Left  Prep: chloraprep       Needles:  Injection technique: Single-shot  Needle Type: Echogenic Needle     Needle Length: 9cm  Needle Gauge: 21     Additional Needles:   Procedures:,,,, ultrasound used (permanent image in chart),,,,  Narrative:  Start time: 03/08/2019 9:04 AM End time: 03/08/2019 9:14 AM Injection made incrementally with aspirations every 5 mL.  Performed by: Personally  Anesthesiologist: Catalina Gravel, MD  Additional Notes: No pain on injection. No increased resistance to injection. Injection made in 5cc increments.  Good needle visualization.  Patient tolerated procedure well.

## 2019-03-08 NOTE — H&P (Signed)
PREOPERATIVE H&P  Chief Complaint: left tibial component loosening subsidence  HPI: Michael [name] is a 58 y.o. male who presents for surgical treatment of left tibial component loosening subsidence.  He denies any changes in medical history.  Past Medical History:  Diagnosis Date  . Arthritis   . Hepatitis    Hep C rx  . Hypertension    Past Surgical History:  Procedure Laterality Date  . COLONOSCOPY    . left knee arthroscopy     . left rotator cuff surgery     . REVISION TOTAL KNEE ARTHROPLASTY Left 06/15/2018  . SHOULDER OPEN ROTATOR CUFF REPAIR Right 01/29/2013   Procedure: RIGHT SHOULDER MINI ROTATOR CUFF REPAIR;  Surgeon: Johnn Hai, MD;  Location: [facility];  Service: Orthopedics;  Laterality: Right;  With ANCHORS  . TOTAL KNEE ARTHROPLASTY Left 02/12/2017  . TOTAL KNEE ARTHROPLASTY Left 02/12/2017   Procedure: LEFT TOTAL KNEE ARTHROPLASTY;  Surgeon: Leandrew Koyanagi, MD;  Location: [facility];  Service: Orthopedics;  Laterality: Left;  . TOTAL KNEE REVISION Left 06/15/2018   Procedure: LEFT TOTAL KNEE REVISION;  Surgeon: Leandrew Koyanagi, MD;  Location: [place];  Service: Orthopedics;  Laterality: Left;   Social History   Socioeconomic History  . Marital status: Single    Spouse name: Not on file  . Number of children: 0  . Years of education: 34  . Highest education level: Not on file  Occupational History  . Occupation: Furniture conservator/restorer  Social Needs  . Financial resource strain: Not on file  . Food insecurity    Worry: Not on file    Inability: Not on file  . Transportation needs    Medical: Not on file    Non-medical: Not on file  Tobacco Use  . Smoking status: Current Every Day Smoker    Packs/day: 1.00    Years: 37.00    Pack years: 37.00    Types: Cigarettes  . Smokeless tobacco: Never Used  Substance and Sexual Activity  . Alcohol use: Not Currently  . Drug use: No    Comment: hx of marijuana and cocaine, LSD  . Sexual activity: Yes    Partners: Female    Birth control/protection: None  Lifestyle  . Physical activity    Days per week: Not on file    Minutes per session: Not on file  . Stress: Not on file  Relationships  . Social Herbalist on phone: Not on file    Gets together: Not on file    Attends religious service: Not on file    Active member of club or organization: Not on file    Attends meetings of clubs or organizations: Not on file    Relationship status: Not on file  Other Topics Concern  . Not on file  Social History Narrative  . Not on file   Family History  Problem Relation Age of Onset  . CAD Mother   . Deep vein thrombosis Brother    Allergies  Allergen Reactions  . Chantix [Varenicline] Itching and Rash   Prior to Admission medications   Medication Sig Start Date End Date Taking? Authorizing Provider  amLODipine (NORVASC) 10 MG tablet Take 1 tablet (10 mg total) by mouth daily. 01/26/19  Yes Freeman Caldron M, PA-C  atorvastatin (LIPITOR) 10 MG tablet Take 1 tablet (10 mg total) by mouth daily. 01/26/19  Yes McClung, Angela M, PA-C  lisinopril-hydrochlorothiazide (ZESTORETIC) 20-25 MG tablet Take 1 tablet  by mouth daily. 01/26/19  Yes Anders Simmonds, PA-C  methadone (DOLOPHINE) 10 MG tablet Take 80 mg by mouth daily.    Yes [provider]  oxyCODONE-acetaminophen (PERCOCET) 5-325 MG tablet Take 1-2 tablets by mouth daily as needed for severe pain. 02/17/19  Yes Cristie Hem, PA-C  psyllium (METAMUCIL) 58.6 % powder Take 1 packet by mouth daily.   Yes [provider]  traZODone (DESYREL) 50 MG tablet Take 0.5 tablets (25 mg total) by mouth at bedtime as needed for sleep. 01/26/19  Yes Anders Simmonds, PA-C  aspirin EC 325 MG tablet Take 1 tablet (325 mg total) by mouth 2 (two) times daily. Patient not taking: Reported on 02/25/2019 06/15/18   Tarry Kos, MD  methocarbamol (ROBAXIN) 750 MG tablet Take 1 tablet (750 mg total) by mouth 2 (two) times daily as needed for muscle spasms.  Patient not taking: Reported on 01/26/2019 07/22/18   Tarry Kos, MD  ondansetron (ZOFRAN) 4 MG tablet Take 1-2 tablets (4-8 mg total) by mouth every 8 (eight) hours as needed for nausea or vomiting. Patient not taking: Reported on 01/26/2019 06/15/18   Tarry Kos, MD  predniSONE (STERAPRED UNI-PAK 21 TAB) 10 MG (21) TBPK tablet Take as directed Patient not taking: Reported on 02/25/2019 01/26/19   Anders Simmonds, PA-C  promethazine (PHENERGAN) 25 MG tablet Take 1 tablet (25 mg total) by mouth every 6 (six) hours as needed for nausea. Patient not taking: Reported on 01/26/2019 06/15/18   Tarry Kos, MD  senna-docusate (SENOKOT S) 8.6-50 MG tablet Take 1 tablet by mouth at bedtime as needed. Patient not taking: Reported on 02/25/2019 06/15/18   Tarry Kos, MD     Positive ROS: All other systems have been reviewed and were otherwise negative with the exception of those mentioned in the HPI and as above.  Physical Exam: General: Alert, no acute distress Cardiovascular: No pedal edema Respiratory: No cyanosis, no use of accessory musculature GI: abdomen soft Skin: No lesions in the area of chief complaint Neurologic: Sensation intact distally Psychiatric: Patient is competent for consent with normal mood and affect Lymphatic: no lymphedema  MUSCULOSKELETAL: exam stable  Assessment: left tibial component loosening subsidence  Plan: Plan for Procedure(s): LEFT TOTAL KNEE REVISION  The risks benefits and alternatives were discussed with the patient including but not limited to the risks of nonoperative treatment, versus surgical intervention including infection, bleeding, nerve injury,  blood clots, cardiopulmonary complications, morbidity, mortality, among others, and they were willing to proceed.   Preoperative templating of the joint replacement has been completed, documented, and submitted to the Operating Room personnel in order to optimize intra-operative equipment management.   Glee Arvin, MD   03/08/2019 9:10 AM

## 2019-03-08 NOTE — Discharge Instructions (Signed)

## 2019-03-08 NOTE — Anesthesia Procedure Notes (Signed)
Procedure Name: MAC Date/Time: 03/08/2019 10:45 AM Performed by: Orlie Dakin, CRNA Pre-anesthesia Checklist: Patient identified, Emergency Drugs available, Suction available, Patient being monitored and Timeout performed Patient Re-evaluated:Patient Re-evaluated prior to induction Oxygen Delivery Method: Simple face mask Preoxygenation: Pre-oxygenation with 100% oxygen Induction Type: IV induction

## 2019-03-08 NOTE — Op Note (Signed)
Date of Surgery: 03/08/2019  INDICATIONS: Mr. Covello is a 58 y.o.-year-old male with loosening of his left tibial component status post total knee replacement couple years ago;  The patient did consent to the procedure after discussion of the risks and benefits.  PREOPERATIVE DIAGNOSIS: Aseptic loosening of left tibial component  POSTOPERATIVE DIAGNOSIS: Same.  PROCEDURE: Revision of left total knee replacement of just the tibial component  SURGEON: N. Eduard Roux, M.D.  ASSIST: Ciro Backer [place], [place]; necessary for the timely completion of procedure and due to complexity of procedure.  ANESTHESIA:  spinal, abductor canal block  IV FLUIDS AND URINE: See anesthesia.  ESTIMATED BLOOD LOSS: Minimal mL.  IMPLANTS: Zimmer Tibia: E with 5 mm augments and 12 x 135 mm length stem  DRAINS: None  COMPLICATIONS: see description of procedure.  DESCRIPTION OF PROCEDURE: The patient was brought to the operating room and placed supine on the operating table.  The patient had been signed prior to the procedure and this was documented. The patient had the anesthesia placed by the anesthesiologist.  A time-out was performed to confirm that this was the correct patient, site, side and location. The patient did receive antibiotics prior to the incision and was re-dosed during the procedure as needed at indicated intervals.  A tourniquet was placed on the upper left thigh.  The patient had the operative extremity prepped and draped in the standard surgical fashion.    The previous surgical scar was incised in a full-thickness fashion.  Dissection was carried down through the subcutaneous tissue and scar.  Full-thickness flaps were elevated.  A medial parapatellar arthrotomy was created along with a small quadriceps snip to improve exposure.  Soft tissues were cleared from around the knee joint for exposure.  The knee was then flexed up and retractors were placed.  The polyethylene liner was removed  without difficulty.  I then used an osteotome to gently pry apart the tibial component from the proximal tibia.  This was done without any difficulty or complication or bone loss.  Fibrous tissue was encountered.  The tibia was then subluxed forward and retractors were placed.  An entry reamer was then used and sequential reaming was performed up to 12 mm to the appropriate depth.  The cut guide was then placed onto the intramedullary reamer and the tibia was then recut 5 mm below the previous surface to expose fresh cancellous bone.  Tibial trial was then placed and no offsets were needed.  The tibial surface was then prepared with the appropriate reamers and keel punch at the proper alignment and rotation.  5 mm augments were placed onto the trial and trialing was performed.  We determine that a 18 mm polyliner was appropriate for gap balancing and patellar tracking.  The femur was assessed for any loosening.  There is no evidence of infection.  The trial components were then removed and the bony surfaces were thoroughly irrigated.  Cement was mixed on the back table under vacuum.  The final components were cemented in place.  Final 18 mm polyethylene liner was placed.  The arthrotomy was closed with interrupted #1 Vicryl.  Irrisept irrigation was placed throughout the joint and the subcutaneous tissue.  Rest of the surgical wound was closed in a layered fashion.  Sterile dressings were applied.  Patient tolerated procedure well had no any complications.  POSTOPERATIVE PLAN: Patient will be admitted to the orthopedic floor and undergo PT for mobilization.  Azucena Cecil, MD 12:18 PM

## 2019-03-08 NOTE — Anesthesia Preprocedure Evaluation (Signed)
Anesthesia Evaluation  Patient identified by MRN, date of birth, ID band Patient awake    Reviewed: Allergy & Precautions, NPO status , Patient's Chart, lab work & pertinent test results  Airway Mallampati: II  TM Distance: >3 FB Neck ROM: Full    Dental  (+) Teeth Intact, Dental Advisory Given   Pulmonary Current Smoker,    Pulmonary exam normal breath sounds clear to auscultation       Cardiovascular hypertension, Pt. on medications Normal cardiovascular exam Rhythm:Regular Rate:Normal     Neuro/Psych negative neurological ROS  negative psych ROS   GI/Hepatic negative GI ROS, (+) Hepatitis - (treated), C  Endo/Other  Obesity   Renal/GU negative Renal ROS     Musculoskeletal  (+) Arthritis , Osteoarthritis,    Abdominal   Peds  Hematology negative hematology ROS (+) Plt 231k    Anesthesia Other Findings Day of surgery medications reviewed with the patient.  Reproductive/Obstetrics                             Anesthesia Physical Anesthesia Plan  ASA: II  Anesthesia Plan: Spinal   Post-op Pain Management:  Regional for Post-op pain   Induction: Intravenous  PONV Risk Score and Plan: 0 and Propofol infusion and Treatment may vary due to age or medical condition  Airway Management Planned: Natural Airway and Nasal Cannula  Additional Equipment:   Intra-op Plan:   Post-operative Plan:   Informed Consent: I have reviewed the patients History and Physical, chart, labs and discussed the procedure including the risks, benefits and alternatives for the proposed anesthesia with the patient or authorized representative who has indicated his/her understanding and acceptance.     Dental advisory given  Plan Discussed with: CRNA, Anesthesiologist and Surgeon  Anesthesia Plan Comments:         Anesthesia Quick Evaluation

## 2019-03-08 NOTE — Anesthesia Postprocedure Evaluation (Signed)
Anesthesia Post Note  Patient: Michael [name]  Procedure(s) Performed: LEFT TOTAL KNEE REVISION (Left Knee)     Patient location during evaluation: PACU Anesthesia Type: Spinal Level of consciousness: oriented, awake and alert and awake Pain management: pain level controlled Vital Signs Assessment: post-procedure vital signs reviewed and stable Respiratory status: spontaneous breathing, respiratory function stable, patient connected to nasal cannula oxygen and nonlabored ventilation Cardiovascular status: blood pressure returned to baseline and stable Postop Assessment: no headache, no backache, no apparent nausea or vomiting, patient able to bend at knees and spinal receding Anesthetic complications: no    Last Vitals:  Vitals:   03/08/19 1415 03/08/19 1438  BP: (!) 149/94 136/88  Pulse: 78 79  Resp: 12   Temp: 36.6 C 36.6 C  SpO2: 98% 98%    Last Pain:  Vitals:   03/08/19 1444  TempSrc:   PainSc: [name]

## 2019-03-09 ENCOUNTER — Encounter (HOSPITAL_COMMUNITY): Payer: Self-pay | Admitting: Orthopaedic Surgery

## 2019-03-09 LAB — CBC
HCT: 39.1 % (ref 39.0–52.0)
Hemoglobin: 13.5 g/dL (ref 13.0–17.0)
MCH: 30.6 pg (ref 26.0–34.0)
MCHC: 34.5 g/dL (ref 30.0–36.0)
MCV: 88.7 fL (ref 80.0–100.0)
Platelets: 260 10*3/uL (ref 150–400)
RBC: 4.41 MIL/uL (ref 4.22–5.81)
RDW: 12.8 % (ref 11.5–15.5)
WBC: 13.6 10*3/uL — ABNORMAL HIGH (ref 4.0–10.5)
nRBC: 0 % (ref 0.0–0.2)

## 2019-03-09 LAB — BASIC METABOLIC PANEL
Anion gap: 12 (ref 5–15)
BUN: 12 mg/dL (ref 6–20)
CO2: 24 mmol/L (ref 22–32)
Calcium: 9.2 mg/dL (ref 8.9–10.3)
Chloride: 101 mmol/L (ref 98–111)
Creatinine, Ser: 0.88 mg/dL (ref 0.61–1.24)
GFR calc Af Amer: >60 mL/min (ref >60)
GFR calc non Af Amer: >60 mL/min (ref >60)
Glucose, Bld: 112 mg/dL — ABNORMAL HIGH (ref 70–99)
Potassium: 4.3 mmol/L (ref 3.5–5.1)
Sodium: 137 mmol/L (ref 135–145)

## 2019-03-09 MED ORDER — ASPIRIN 81 MG PO CHEW: 81.0000 mg | CHEWABLE_TABLET | Freq: Two times a day (BID) | ORAL | 0 refills | Status: DC

## 2019-03-09 MED ORDER — METHOCARBAMOL 500 MG PO TABS: 500.0000 mg | ORAL_TABLET | Freq: Two times a day (BID) | ORAL | 0 refills | Status: DC | PRN

## 2019-03-09 MED ORDER — OXYCODONE-ACETAMINOPHEN 5-325 MG PO TABS: 1.0000 | ORAL_TABLET | ORAL | 0 refills | Status: DC | PRN

## 2019-03-09 MED ORDER — DULCOLAX 5 MG PO TBEC: 5.0000 mg | DELAYED_RELEASE_TABLET | Freq: Every day | ORAL | 1 refills | Status: DC | PRN

## 2019-03-09 MED ORDER — ONDANSETRON HCL 4 MG PO TABS: 4.0000 mg | ORAL_TABLET | Freq: Three times a day (TID) | ORAL | 0 refills | Status: DC | PRN

## 2019-03-09 NOTE — Plan of Care (Signed)

## 2019-03-09 NOTE — Progress Notes (Signed)
Subjective: 1 Day Post-Op Procedure(s) (LRB): LEFT TOTAL KNEE REVISION (Left) Patient reports pain as mild.    Objective: Vital signs in last 24 hours: Temp:  [97.6 F (36.4 C)-98.2 F (36.8 C)] 98.1 F (36.7 C) (10/13 0406) Pulse Rate:  [66-96] 69 (10/13 0406) Resp:  [8-29] 18 (10/13 0406) BP: (120-163)/(66-98) 135/79 (10/13 0406) SpO2:  [94 %-98 %] 94 % (10/13 0406)  Intake/Output from previous day: 10/12 0701 - 10/13 0700 In: 1050 [I.V.:1050] Out: 4400 [Urine:4400] Intake/Output this shift: No intake/output data recorded.  Recent Labs    03/09/19 0235  HGB 13.5   Recent Labs    03/09/19 0235  WBC 13.6*  RBC 4.41  HCT 39.1  PLT 260   Recent Labs    03/09/19 0235  NA 137  K 4.3  CL 101  CO2 24  BUN 12  CREATININE 0.88  GLUCOSE 112*  CALCIUM 9.2   No results for input(s): LABPT, INR in the last 72 hours.  Neurologically intact Neurovascular intact Sensation intact distally Intact pulses distally Dorsiflexion/Plantar flexion intact Incision: dressing C/D/I No cellulitis present Compartment soft   Assessment/Plan: 1 Day Post-Op Procedure(s) (LRB): LEFT TOTAL KNEE REVISION (Left) Advance diet Up with therapy D/C IV fluids Discharge home with home health after first or second PT session depending on his progression WBAT LLE     Patient's anticipated LOS is less than 2 midnights, meeting these requirements: - Younger than 100 - Lives within 1 hour of care - Has a competent adult at home to recover with post-op recover - NO history of  - Chronic pain requiring opiods  - Diabetes  - Coronary Artery Disease  - Heart failure  - Heart attack  - Stroke  - DVT/VTE  - Cardiac arrhythmia  - Respiratory Failure/COPD  - Renal failure  - Anemia  - Advanced Liver disease       Aundra Dubin 03/09/2019, 7:32 AM

## 2019-03-09 NOTE — Plan of Care (Signed)
  Problem: Pain Managment: Goal: General experience of comfort will improve Outcome: Progressing   Problem: Safety: Goal: Ability to remain free from injury will improve Outcome: Progressing   

## 2019-03-09 NOTE — TOC Transition Note (Signed)
Transition of Care [facility]) - CM/SW Discharge Note   Patient Details  Name: HILTON SAEPHAN MRN: [id number] Date of Birth: [birth year]  [facility] CM/SW Contact:  Midge Minium RN, BSN, NCM-BC, ACM-RN [phone] Phone Number: 03/09/2019, 8:36 AM   Clinical Narrative:    CM following for dispositional needs. Patient is a 58 y/o male s/p L total knee revision. PMH includes hep C, HTN, and L TKA. CM spoke to the patient to discuss the POC. The patient was independent with his ADLs PTA; states having a RW, SPC and BSC available for use at home. Patient was preoperatively setup with [facility]; Joen Laura [facility] liaison) informed of patients discharge for today. No further needs from CM.    Final next level of care: [place] Barriers to Discharge: No Barriers Identified   Patient Goals and CMS Choice Patient states their goals for this hospitalization and ongoing recovery are:: "to get stronger"   Choice offered to / list presented to : NA   Discharge Plan and Services            DME Arranged: N/A DME Agency: NA HH Arranged: PT [place] Agency: [facility] (formerly [employer])  Social Determinants of Health ([place]) Interventions     Readmission Risk Interventions No flowsheet data found.

## 2019-03-09 NOTE — Progress Notes (Signed)
Physical Therapy Treatment Patient Details Name: Michael [name] MRN: [id number] DOB: 05/09/1961 Today's Date: 03/09/2019    History of Present Illness Pt is a 58 y/o male s/p L total knee revision. PMH includes hep C, HTN, and L TKA.     PT Comments    Patient received sitting EOB, very anxious about discharge and wanting to go home, eager to participate in PT today. Overly eager throughout session to the point of being impulsive and often required cues for safety, although suspect this will likely improve once patient is home and not as driven to leave hospital. Able to perform functional transfers with S with and without device, tolerated gait training approximately 250ft with RW and able to perform stair training with RW and min guard. Also reviewed exercises for TKR, patient declines physical practice as he is already familiar and comfortable with all exercises due to past TKR surgeries. He was left up in the chair with all needs met, emphasized safety with mobility to prevent fall given patient's impulsivity today; RN aware of patient status and readiness for DC.     Follow Up Recommendations  Follow surgeon's recommendation for DC plan and follow-up therapies     Equipment Recommendations  Rolling walker with 5" wheels    Recommendations for Other Services       Precautions / Restrictions Precautions Precautions: Knee Precaution Booklet Issued: Yes (comment) Precaution Comments: Reviewed knee precautions with pt.  Restrictions Weight Bearing Restrictions: Yes LLE Weight Bearing: Weight bearing as tolerated    Mobility  Bed Mobility               General bed mobility comments: EOB when PT entered  Transfers Overall transfer level: Needs assistance Equipment used: Rolling walker (2 wheeled);None Transfers: Sit to/from Stand Sit to Stand: Supervision         General transfer comment: S for sit to stand with and without RW,  impulsive  Ambulation/Gait Ambulation/Gait assistance: Supervision Gait Distance (Feet): 250 Feet Assistive device: Rolling walker (2 wheeled) Gait Pattern/deviations: Step-through pattern;Decreased step length - right;Decreased step length - left;Decreased dorsiflexion - right;Decreased dorsiflexion - left;Decreased stride length;Trunk flexed Gait velocity: Decreased   General Gait Details: cues for upright posture with gait, heel-toe pattern, VC for safety especially in tight environments   Stairs Stairs: Yes Stairs assistance: Min guard Stair Management: With walker;Backwards Number of Stairs: 1 General stair comments: backwards ascent/forwards descent with RW, VC for sequencing; at first pushed walker to the side and ascended step with U rail on R/HHA on L with MinA, much safer with RW   Wheelchair Mobility    Modified Rankin (Stroke Patients Only)       Balance Overall balance assessment: Needs assistance Sitting-balance support: No upper extremity supported;Feet supported Sitting balance-Leahy Scale: Good     Standing balance support: Bilateral upper extremity supported;During functional activity Standing balance-Leahy Scale: Fair Standing balance comment: Reliant on BUE support                             Cognition Arousal/Alertness: Awake/alert Behavior During Therapy: WFL for tasks assessed/performed Overall Cognitive Status: Within Functional Limits for tasks assessed                                 General Comments: rushed and moments of unsafety but feel this is due to eagerness to go home rather than   true deficit      Exercises Total Joint Exercises Goniometric ROM: in sitting: extension 18 degrees, flexion 81 degrees    General Comments        Pertinent Vitals/Pain Pain Assessment: 0-10 Pain Score: 5  Pain Location: L knee  Pain Descriptors / Indicators: Aching;Operative site guarding Pain Intervention(s): Limited  activity within patient's tolerance;Monitored during session    Home Living                      Prior Function            PT Goals (current goals can now be found in the care plan section) Acute Rehab PT Goals Patient Stated Goal: to go home PT Goal Formulation: With patient Time For Goal Achievement: 03/22/19 Potential to Achieve Goals: Good Progress towards PT goals: Progressing toward goals    Frequency    7X/week      PT Plan Current plan remains appropriate    Co-evaluation              AM-PAC PT "6 Clicks" Mobility   Outcome Measure  Help needed turning from your back to your side while in a flat bed without using bedrails?: None Help needed moving from lying on your back to sitting on the side of a flat bed without using bedrails?: None Help needed moving to and from a bed to a chair (including a wheelchair)?: None Help needed standing up from a chair using your arms (e.g., wheelchair or bedside chair)?: None Help needed to walk in hospital room?: A Little Help needed climbing 3-5 steps with a railing? : A Little 6 Click Score: 22    End of Session   Activity Tolerance: Patient tolerated treatment well Patient left: in chair;with call bell/phone within reach Nurse Communication: Other (comment)(PT complete, ready for DC) PT Visit Diagnosis: Other abnormalities of gait and mobility (R26.89);Pain Pain - Right/Left: Left Pain - part of body: Knee     Time: 1914-7829 PT Time Calculation (min) (ACUTE ONLY): 20 min  Charges:  $Gait Training: 8-22 mins                     Deniece Ree PT, DPT, CBIS  Supplemental Physical Therapist [place]    Pager [phone] Acute Rehab Office [phone]

## 2019-03-09 NOTE — Progress Notes (Signed)
Discharge paperwork and instructions given to pt. Pt anxious, stating he is ready to leave hospital. Pt has all personal belongings.

## 2019-03-09 NOTE — Discharge Summary (Signed)
Patient ID: Michael [name] MRN: [id number] DOB/AGE: 1961-04-25 58 y.o.  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 Arthritis   . Hepatitis    Hep C rx  . Hypertension     Surgeries: Procedure(s): LEFT TOTAL KNEE REVISION on 03/08/2019   Consultants:   Discharged Condition: Improved  Hospital Course: Michael MinerScott D Calo is an 58 y.o. male who was admitted 03/08/2019 for operative treatment ofLoosening of prosthesis of left total knee replacement (HCC). Patient has severe unremitting pain that affects sleep, daily activities, and work/hobbies. After pre-op clearance the patient was taken to the operating room on 03/08/2019 and underwent  Procedure(s): LEFT TOTAL KNEE REVISION.    Patient was given perioperative antibiotics:  Anti-infectives (From admission, onward)   Start     Dose/Rate Route Frequency Ordered Stop   03/08/19 1600  ceFAZolin (ANCEF) IVPB 2g/100 mL premix     2 g 200 mL/hr over 30 Minutes Intravenous Every 6 hours 03/08/19 1437 03/09/19 0453   03/08/19 0815  ceFAZolin (ANCEF) IVPB 2g/100 mL premix     2 g 200 mL/hr over 30 Minutes Intravenous On call to O.R. 03/08/19 0804 03/08/19 1025       Patient was given sequential compression devices, early ambulation, and chemoprophylaxis to prevent DVT.  Patient benefited maximally from hospital stay and there were no complications.    Recent vital signs:  Patient Vitals for the past 24 hrs:  BP Temp Temp src Pulse Resp SpO2  03/09/19 0406 135/79 98.1 F (36.7 C) Oral 69 18 94 %  03/09/19 0007 127/66 98.2 F (36.8 C) Oral 66 18 95 %  03/08/19 2005 134/68 98.2 F (36.8 C) Oral 82 18 94 %  03/08/19 1438 136/88 97.8 F (36.6 C) Oral 79 - 98 %  03/08/19  1415 (!) 149/94 97.8 F (36.6 C) - 78 12 98 %  03/08/19 1400 (!) 135/92 - - 74 (!) 29 97 %  03/08/19 1345 (!) 139/98 - - 78 (!) 22 97 %  03/08/19 1330 122/79 - - 80 17 95 %  03/08/19 1315 120/80 - - 86 (!) 22 96 %  03/08/19 1300 120/84 97.6 F (36.4 C) - 89 16 97 %  03/08/19 0920 123/82 - - 77 (!) 8 98 %  03/08/19 0915 (!) 141/79 - - 75 (!) 9 95 %  03/08/19 0910 136/80 - - 80 13 98 %  03/08/19 0905 (!) 149/88 - - 82 13 96 %  03/08/19 0810 (!) 163/83 97.9 F (36.6 C) - 96 20 96 %     Recent laboratory studies:  Recent Labs    03/09/19 0235  WBC 13.6*  HGB 13.5  HCT 39.1  PLT 260  NA 137  K 4.3  CL 101  CO2 24  BUN 12  CREATININE 0.88  GLUCOSE 112*  CALCIUM 9.2     Discharge Medications:   Allergies as of 03/09/2019      Reactions   Chantix [varenicline] Itching, Rash      Medication List    STOP taking these medications   aspirin EC 325 MG tablet Replaced by: aspirin 81 MG chewable tablet   methadone 10 MG tablet Commonly known as: DOLOPHINE   predniSONE 10 MG (21) Tbpk tablet Commonly  known as: STERAPRED UNI-PAK 21 TAB   promethazine 25 MG tablet Commonly known as: PHENERGAN   senna-docusate 8.6-50 MG tablet Commonly known as: Senokot S     TAKE these medications   amLODipine 10 MG tablet Commonly known as: NORVASC Take 1 tablet (10 mg total) by mouth daily.   aspirin 81 MG chewable tablet Chew 1 tablet (81 mg total) by mouth 2 (two) times daily. Replaces: aspirin EC 325 MG tablet   atorvastatin 10 MG tablet Commonly known as: LIPITOR Take 1 tablet (10 mg total) by mouth daily.   Dulcolax 5 MG EC tablet Generic drug: bisacodyl Take 1 tablet (5 mg total) by mouth daily as needed for moderate constipation.   lisinopril-hydrochlorothiazide 20-25 MG tablet Commonly known as: ZESTORETIC Take 1 tablet by mouth daily.   methocarbamol 500 MG tablet Commonly known as: ROBAXIN Take 1 tablet (500 mg total) by mouth 2 (two) times daily as needed  for muscle spasms. What changed:   medication strength  how much to take   ondansetron 4 MG tablet Commonly known as: Zofran Take 1 tablet (4 mg total) by mouth every 8 (eight) hours as needed for nausea or vomiting. What changed: how much to take   oxyCODONE-acetaminophen 5-325 MG tablet Commonly known as: Percocet Take 1-2 tablets by mouth every 4 (four) hours as needed for severe pain. What changed: when to take this   psyllium 58.6 % powder Commonly known as: METAMUCIL Take 1 packet by mouth daily.   traZODone 50 MG tablet Commonly known as: DESYREL Take 0.5 tablets (25 mg total) by mouth at bedtime as needed for sleep.            Durable Medical Equipment  (From admission, onward)         Start     Ordered   03/08/19 1437  DME Walker rolling  Once    Question:  Patient needs a walker to treat with the following condition  Answer:  Total knee replacement status   03/08/19 1437   03/08/19 1437  DME 3 n 1  Once     03/08/19 1437   03/08/19 1437  DME Bedside commode  Once    Question:  Patient needs a bedside commode to treat with the following condition  Answer:  Total knee replacement status   03/08/19 1437          Diagnostic Studies: Dg Chest 2 View  Result Date: 03/02/2019 CLINICAL DATA:  Preop for total knee replacement. EXAM: CHEST - 2 VIEW COMPARISON:  Radiographs of January 26, 2013. FINDINGS: The heart size and mediastinal contours are within normal limits. Both lungs are clear. The visualized skeletal structures are unremarkable. IMPRESSION: No active cardiopulmonary disease. Electronically Signed   By: Lupita Raider M.D.   On: 03/02/2019 14:43   Dg Knee Left Port  Result Date: 03/08/2019 CLINICAL DATA:  Left total knee replacement/revision EXAM: PORTABLE LEFT KNEE - 1-2 VIEW COMPARISON:  None. FINDINGS: The patient is status post revision of his left total knee replacement. Hardware is in good position. IMPRESSION: Revision of total left knee  replacement Electronically Signed   By: Gerome Sam III M.D   On: 03/08/2019 13:39   Xr Knee 1-2 Views Left  Result Date: 02/16/2019 Subsidence and loosening of the tibial component into varus.  Femoral component appears to be without complication.   Disposition: Discharge disposition: 01-Home or Self Care         Follow-up Information  Leandrew Koyanagi, MD. Schedule an appointment as soon as possible for a visit in 2 week(s).   Specialty: Orthopedic Surgery Contact information: [place] [id number] [phone]            Signed: Aundra Dubin 03/09/2019, 7:38 AM

## 2019-03-10 DIAGNOSIS — F1911 Other psychoactive substance abuse, in remission: Secondary | ICD-10-CM | POA: Diagnosis not present

## 2019-03-10 DIAGNOSIS — T84033A Mechanical loosening of internal left knee prosthetic joint, initial encounter: Secondary | ICD-10-CM | POA: Diagnosis not present

## 2019-03-10 DIAGNOSIS — I10 Essential (primary) hypertension: Secondary | ICD-10-CM | POA: Diagnosis not present

## 2019-03-10 DIAGNOSIS — Z96652 Presence of left artificial knee joint: Secondary | ICD-10-CM | POA: Diagnosis not present

## 2019-03-10 DIAGNOSIS — F17210 Nicotine dependence, cigarettes, uncomplicated: Secondary | ICD-10-CM | POA: Diagnosis not present

## 2019-03-10 DIAGNOSIS — T84033D Mechanical loosening of internal left knee prosthetic joint, subsequent encounter: Secondary | ICD-10-CM | POA: Diagnosis not present

## 2019-03-10 DIAGNOSIS — M1612 Unilateral primary osteoarthritis, left hip: Secondary | ICD-10-CM | POA: Diagnosis not present

## 2019-03-10 DIAGNOSIS — Z7982 Long term (current) use of aspirin: Secondary | ICD-10-CM | POA: Diagnosis not present

## 2019-03-10 DIAGNOSIS — E785 Hyperlipidemia, unspecified: Secondary | ICD-10-CM | POA: Diagnosis not present

## 2019-03-10 DIAGNOSIS — Z9181 History of falling: Secondary | ICD-10-CM | POA: Diagnosis not present

## 2019-03-11 ENCOUNTER — Telehealth: Payer: Self-pay | Admitting: Orthopaedic Surgery

## 2019-03-11 DIAGNOSIS — F1911 Other psychoactive substance abuse, in remission: Secondary | ICD-10-CM | POA: Diagnosis not present

## 2019-03-11 DIAGNOSIS — M1612 Unilateral primary osteoarthritis, left hip: Secondary | ICD-10-CM | POA: Diagnosis not present

## 2019-03-11 DIAGNOSIS — E785 Hyperlipidemia, unspecified: Secondary | ICD-10-CM | POA: Diagnosis not present

## 2019-03-11 DIAGNOSIS — T84033D Mechanical loosening of internal left knee prosthetic joint, subsequent encounter: Secondary | ICD-10-CM | POA: Diagnosis not present

## 2019-03-11 DIAGNOSIS — F17210 Nicotine dependence, cigarettes, uncomplicated: Secondary | ICD-10-CM | POA: Diagnosis not present

## 2019-03-11 DIAGNOSIS — Z7982 Long term (current) use of aspirin: Secondary | ICD-10-CM | POA: Diagnosis not present

## 2019-03-11 DIAGNOSIS — Z9181 History of falling: Secondary | ICD-10-CM | POA: Diagnosis not present

## 2019-03-11 DIAGNOSIS — I10 Essential (primary) hypertension: Secondary | ICD-10-CM | POA: Diagnosis not present

## 2019-03-11 NOTE — Telephone Encounter (Signed)
Called number states number is disconnect or no longer in service.

## 2019-03-11 NOTE — Telephone Encounter (Signed)
Antony Haste from [place] called. Need info about surgery. [phone]

## 2019-03-15 ENCOUNTER — Telehealth: Payer: Self-pay | Admitting: Orthopaedic Surgery

## 2019-03-15 ENCOUNTER — Other Ambulatory Visit: Payer: Self-pay | Admitting: Physician Assistant

## 2019-03-15 DIAGNOSIS — F1911 Other psychoactive substance abuse, in remission: Secondary | ICD-10-CM | POA: Diagnosis not present

## 2019-03-15 DIAGNOSIS — T84033D Mechanical loosening of internal left knee prosthetic joint, subsequent encounter: Secondary | ICD-10-CM | POA: Diagnosis not present

## 2019-03-15 DIAGNOSIS — Z7982 Long term (current) use of aspirin: Secondary | ICD-10-CM | POA: Diagnosis not present

## 2019-03-15 DIAGNOSIS — I10 Essential (primary) hypertension: Secondary | ICD-10-CM | POA: Diagnosis not present

## 2019-03-15 DIAGNOSIS — E785 Hyperlipidemia, unspecified: Secondary | ICD-10-CM | POA: Diagnosis not present

## 2019-03-15 DIAGNOSIS — M1612 Unilateral primary osteoarthritis, left hip: Secondary | ICD-10-CM | POA: Diagnosis not present

## 2019-03-15 DIAGNOSIS — Z9181 History of falling: Secondary | ICD-10-CM | POA: Diagnosis not present

## 2019-03-15 DIAGNOSIS — F17210 Nicotine dependence, cigarettes, uncomplicated: Secondary | ICD-10-CM | POA: Diagnosis not present

## 2019-03-15 MED ORDER — OXYCODONE HCL 5 MG PO TABS: ORAL_TABLET | ORAL | 0 refills | Status: DC

## 2019-03-15 NOTE — Telephone Encounter (Signed)
Received call from Jenny Reichmann with [employer] asking if the surgery was out patient. The number to contact Jenny Reichmann is [phone]   Claim# is [id number]

## 2019-03-15 NOTE — Telephone Encounter (Signed)
Patient would like pain medication called in. His call back number is [phone]

## 2019-03-15 NOTE — Telephone Encounter (Signed)
Sent in

## 2019-03-16 NOTE — Telephone Encounter (Signed)
It was inpatient.

## 2019-03-16 NOTE — Telephone Encounter (Signed)
Can you answer this question? 

## 2019-03-16 NOTE — Telephone Encounter (Signed)
I called Michael [name] and advised. He is going to add to patient's claim.

## 2019-03-17 DIAGNOSIS — Z7982 Long term (current) use of aspirin: Secondary | ICD-10-CM | POA: Diagnosis not present

## 2019-03-17 DIAGNOSIS — F17210 Nicotine dependence, cigarettes, uncomplicated: Secondary | ICD-10-CM | POA: Diagnosis not present

## 2019-03-17 DIAGNOSIS — Z9181 History of falling: Secondary | ICD-10-CM | POA: Diagnosis not present

## 2019-03-17 DIAGNOSIS — I10 Essential (primary) hypertension: Secondary | ICD-10-CM | POA: Diagnosis not present

## 2019-03-17 DIAGNOSIS — T84033D Mechanical loosening of internal left knee prosthetic joint, subsequent encounter: Secondary | ICD-10-CM | POA: Diagnosis not present

## 2019-03-17 DIAGNOSIS — E785 Hyperlipidemia, unspecified: Secondary | ICD-10-CM | POA: Diagnosis not present

## 2019-03-17 DIAGNOSIS — F1911 Other psychoactive substance abuse, in remission: Secondary | ICD-10-CM | POA: Diagnosis not present

## 2019-03-17 DIAGNOSIS — M1612 Unilateral primary osteoarthritis, left hip: Secondary | ICD-10-CM | POA: Diagnosis not present

## 2019-03-18 DIAGNOSIS — I10 Essential (primary) hypertension: Secondary | ICD-10-CM | POA: Diagnosis not present

## 2019-03-18 DIAGNOSIS — T84033D Mechanical loosening of internal left knee prosthetic joint, subsequent encounter: Secondary | ICD-10-CM | POA: Diagnosis not present

## 2019-03-18 DIAGNOSIS — F1911 Other psychoactive substance abuse, in remission: Secondary | ICD-10-CM | POA: Diagnosis not present

## 2019-03-18 DIAGNOSIS — Z7982 Long term (current) use of aspirin: Secondary | ICD-10-CM | POA: Diagnosis not present

## 2019-03-18 DIAGNOSIS — E785 Hyperlipidemia, unspecified: Secondary | ICD-10-CM | POA: Diagnosis not present

## 2019-03-18 DIAGNOSIS — F17210 Nicotine dependence, cigarettes, uncomplicated: Secondary | ICD-10-CM | POA: Diagnosis not present

## 2019-03-18 DIAGNOSIS — Z9181 History of falling: Secondary | ICD-10-CM | POA: Diagnosis not present

## 2019-03-18 DIAGNOSIS — M1612 Unilateral primary osteoarthritis, left hip: Secondary | ICD-10-CM | POA: Diagnosis not present

## 2019-03-22 DIAGNOSIS — F1911 Other psychoactive substance abuse, in remission: Secondary | ICD-10-CM | POA: Diagnosis not present

## 2019-03-22 DIAGNOSIS — M1612 Unilateral primary osteoarthritis, left hip: Secondary | ICD-10-CM | POA: Diagnosis not present

## 2019-03-22 DIAGNOSIS — T84033D Mechanical loosening of internal left knee prosthetic joint, subsequent encounter: Secondary | ICD-10-CM | POA: Diagnosis not present

## 2019-03-22 DIAGNOSIS — Z9181 History of falling: Secondary | ICD-10-CM | POA: Diagnosis not present

## 2019-03-22 DIAGNOSIS — I10 Essential (primary) hypertension: Secondary | ICD-10-CM | POA: Diagnosis not present

## 2019-03-22 DIAGNOSIS — Z7982 Long term (current) use of aspirin: Secondary | ICD-10-CM | POA: Diagnosis not present

## 2019-03-22 DIAGNOSIS — F17210 Nicotine dependence, cigarettes, uncomplicated: Secondary | ICD-10-CM | POA: Diagnosis not present

## 2019-03-22 DIAGNOSIS — E785 Hyperlipidemia, unspecified: Secondary | ICD-10-CM | POA: Diagnosis not present

## 2019-03-23 ENCOUNTER — Inpatient Hospital Stay: Payer: BC Managed Care – PPO | Admitting: Orthopaedic Surgery

## 2019-03-24 ENCOUNTER — Inpatient Hospital Stay: Payer: BC Managed Care – PPO | Admitting: Orthopaedic Surgery

## 2019-03-24 DIAGNOSIS — Z7982 Long term (current) use of aspirin: Secondary | ICD-10-CM | POA: Diagnosis not present

## 2019-03-24 DIAGNOSIS — F17210 Nicotine dependence, cigarettes, uncomplicated: Secondary | ICD-10-CM | POA: Diagnosis not present

## 2019-03-24 DIAGNOSIS — F1911 Other psychoactive substance abuse, in remission: Secondary | ICD-10-CM | POA: Diagnosis not present

## 2019-03-24 DIAGNOSIS — Z9181 History of falling: Secondary | ICD-10-CM | POA: Diagnosis not present

## 2019-03-24 DIAGNOSIS — T84033D Mechanical loosening of internal left knee prosthetic joint, subsequent encounter: Secondary | ICD-10-CM | POA: Diagnosis not present

## 2019-03-24 DIAGNOSIS — E785 Hyperlipidemia, unspecified: Secondary | ICD-10-CM | POA: Diagnosis not present

## 2019-03-24 DIAGNOSIS — M1612 Unilateral primary osteoarthritis, left hip: Secondary | ICD-10-CM | POA: Diagnosis not present

## 2019-03-24 DIAGNOSIS — I10 Essential (primary) hypertension: Secondary | ICD-10-CM | POA: Diagnosis not present

## 2019-03-25 ENCOUNTER — Ambulatory Visit (INDEPENDENT_AMBULATORY_CARE_PROVIDER_SITE_OTHER): Payer: BC Managed Care – PPO | Admitting: Physician Assistant

## 2019-03-25 ENCOUNTER — Ambulatory Visit (INDEPENDENT_AMBULATORY_CARE_PROVIDER_SITE_OTHER): Payer: BC Managed Care – PPO

## 2019-03-25 ENCOUNTER — Other Ambulatory Visit: Payer: Self-pay

## 2019-03-25 ENCOUNTER — Encounter: Payer: Self-pay | Admitting: Orthopaedic Surgery

## 2019-03-25 DIAGNOSIS — Z96652 Presence of left artificial knee joint: Secondary | ICD-10-CM

## 2019-03-25 MED ORDER — OXYCODONE-ACETAMINOPHEN 5-325 MG PO TABS: 1.0000 | ORAL_TABLET | Freq: Three times a day (TID) | ORAL | 0 refills | Status: DC | PRN

## 2019-03-25 NOTE — Progress Notes (Signed)
Post-Op Visit Note   Patient: Michael [name]           Date of Birth: 1960-08-06           MRN: [id number] Visit Date: 03/25/2019 PCP: Ladell Pier, MD   Assessment & Plan:  Chief Complaint:  Chief Complaint  Patient presents with  . Left Knee - Routine Post Op   Visit Diagnoses:  1. S/P revision of total knee, left     Plan: patient is a pleasant 58 year old gentleman who presents to our clinic 17 days status post left total knee revision, date of surgery 03/08/2019.  He has been doing well.  No fevers or chills.  Minimal pain.  Examination of the left knee shows a well-healing surgical incision with nylon sutures in place.  He has moderate swelling.  His calf is soft nontender.  He is neurovascular intact distally.  Today, the nylon sutures were removed and Steri-Strips applied.  We will go ahead and start him in outpatient physical therapy.  Electronic prescription was sent in as well as a paper prescription given to the patient.  He will follow-up with [place] in 4 weeks time for repeat evaluation and x-rays.  Dental prophylaxis reinforced.  Call with concerns or questions.  Follow-Up Instructions: Return in about 4 weeks (around 04/22/2019).   Orders:  Orders Placed This Encounter  Procedures  . XR KNEE 3 VIEW LEFT  . Ambulatory referral to Physical Therapy   Meds ordered this encounter  Medications  . DISCONTD: oxyCODONE-acetaminophen (PERCOCET) 5-325 MG tablet    Sig: Take 1-2 tablets by mouth every 8 (eight) hours as needed for severe pain.    Dispense:  30 tablet    Refill:  0  . oxyCODONE-acetaminophen (PERCOCET) 5-325 MG tablet    Sig: Take 1-2 tablets by mouth every 8 (eight) hours as needed for severe pain.    Dispense:  30 tablet    Refill:  0    Imaging: Xr Knee 3 View Left  Result Date: 03/25/2019 Well-seated prosthesis without complication   PMFS History: Patient Active Problem List   Diagnosis Date Noted  . Loosening of prosthesis of left  total knee replacement ([place]) 03/08/2019  . Status post revision of total knee replacement, 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  . Unilateral primary osteoarthritis, left hip 09/08/2018  . Positive depression screening 05/22/2017  . Hepatitis C virus infection cured after antiviral drug therapy 01/19/2017  . Hyperlipidemia 10/23/2016  . Hypertension 10/18/2016  . Tobacco dependence 10/18/2016  . 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 Right rotator cuff tear 01/29/2013   Past Medical History:  Diagnosis Date  . Arthritis   . Hepatitis    Hep C rx  . Hypertension     Family History  Problem Relation Age of Onset  . CAD Mother   . Deep vein thrombosis Brother     Past Surgical History:  Procedure Laterality Date  . COLONOSCOPY    . left knee arthroscopy     . left rotator cuff surgery     . REVISION TOTAL KNEE ARTHROPLASTY Left 06/15/2018  . SHOULDER OPEN ROTATOR CUFF REPAIR Right 01/29/2013   Procedure: RIGHT SHOULDER MINI ROTATOR CUFF REPAIR;  Surgeon: Johnn Hai, MD;  Location: [facility];  Service: Orthopedics;  Laterality: Right;  With ANCHORS  . TOTAL KNEE ARTHROPLASTY Left 02/12/2017  . TOTAL KNEE ARTHROPLASTY Left 02/12/2017   Procedure: LEFT TOTAL KNEE ARTHROPLASTY;  Surgeon: Leandrew Koyanagi, MD;  Location:  [facility] OR;  Service: Orthopedics;  Laterality: Left;  . TOTAL KNEE REVISION Left 06/15/2018   Procedure: LEFT TOTAL KNEE REVISION;  Surgeon: Tarry Kos, MD;  Location: [facility] OR;  Service: Orthopedics;  Laterality: Left;  . TOTAL KNEE REVISION Left 03/08/2019   Procedure: LEFT TOTAL KNEE REVISION;  Surgeon: Tarry Kos, MD;  Location: [facility] OR;  Service: Orthopedics;  Laterality: Left;   Social History   Occupational History  . Occupation: Chartered certified accountant  Tobacco Use  . Smoking status: Current Every Day Smoker    Packs/day: 1.00    Years: 37.00    Pack years: 37.00    Types: Cigarettes  . Smokeless tobacco: Never Used  Substance and Sexual Activity  . Alcohol use: Not Currently  .  Drug use: No    Comment: hx of marijuana and cocaine, LSD  . Sexual activity: Yes    Partners: Female    Birth control/protection: None

## 2019-03-26 DIAGNOSIS — Z9181 History of falling: Secondary | ICD-10-CM | POA: Diagnosis not present

## 2019-03-26 DIAGNOSIS — I10 Essential (primary) hypertension: Secondary | ICD-10-CM | POA: Diagnosis not present

## 2019-03-26 DIAGNOSIS — M1612 Unilateral primary osteoarthritis, left hip: Secondary | ICD-10-CM | POA: Diagnosis not present

## 2019-03-26 DIAGNOSIS — F1911 Other psychoactive substance abuse, in remission: Secondary | ICD-10-CM | POA: Diagnosis not present

## 2019-03-26 DIAGNOSIS — E785 Hyperlipidemia, unspecified: Secondary | ICD-10-CM | POA: Diagnosis not present

## 2019-03-26 DIAGNOSIS — F17210 Nicotine dependence, cigarettes, uncomplicated: Secondary | ICD-10-CM | POA: Diagnosis not present

## 2019-03-26 DIAGNOSIS — T84033D Mechanical loosening of internal left knee prosthetic joint, subsequent encounter: Secondary | ICD-10-CM | POA: Diagnosis not present

## 2019-03-26 DIAGNOSIS — Z7982 Long term (current) use of aspirin: Secondary | ICD-10-CM | POA: Diagnosis not present

## 2019-03-29 DIAGNOSIS — M1612 Unilateral primary osteoarthritis, left hip: Secondary | ICD-10-CM | POA: Diagnosis not present

## 2019-03-29 DIAGNOSIS — I10 Essential (primary) hypertension: Secondary | ICD-10-CM | POA: Diagnosis not present

## 2019-03-29 DIAGNOSIS — Z7982 Long term (current) use of aspirin: Secondary | ICD-10-CM | POA: Diagnosis not present

## 2019-03-29 DIAGNOSIS — F17210 Nicotine dependence, cigarettes, uncomplicated: Secondary | ICD-10-CM | POA: Diagnosis not present

## 2019-03-29 DIAGNOSIS — Z9181 History of falling: Secondary | ICD-10-CM | POA: Diagnosis not present

## 2019-03-29 DIAGNOSIS — F1911 Other psychoactive substance abuse, in remission: Secondary | ICD-10-CM | POA: Diagnosis not present

## 2019-03-29 DIAGNOSIS — E785 Hyperlipidemia, unspecified: Secondary | ICD-10-CM | POA: Diagnosis not present

## 2019-03-29 DIAGNOSIS — T84033D Mechanical loosening of internal left knee prosthetic joint, subsequent encounter: Secondary | ICD-10-CM | POA: Diagnosis not present

## 2019-03-30 ENCOUNTER — Encounter: Payer: Self-pay | Admitting: Physical Therapy

## 2019-03-30 ENCOUNTER — Ambulatory Visit (INDEPENDENT_AMBULATORY_CARE_PROVIDER_SITE_OTHER): Payer: BC Managed Care – PPO | Admitting: Physical Therapy

## 2019-03-30 ENCOUNTER — Other Ambulatory Visit: Payer: Self-pay

## 2019-03-30 DIAGNOSIS — R269 Unspecified abnormalities of gait and mobility: Secondary | ICD-10-CM

## 2019-03-30 DIAGNOSIS — M25662 Stiffness of left knee, not elsewhere classified: Secondary | ICD-10-CM

## 2019-03-30 DIAGNOSIS — R600 Localized edema: Secondary | ICD-10-CM

## 2019-03-30 DIAGNOSIS — M25562 Pain in left knee: Secondary | ICD-10-CM | POA: Diagnosis not present

## 2019-03-30 NOTE — Patient Instructions (Signed)
Access Code: [email or web address]  URL: [email or web address]  Date: 03/30/2019  Prepared by: Faustino Congress   Exercises  Supine Quad Set - 10 reps - 1 sets - 5 sec hold - 3x daily - 7x weekly  Small Range Straight Leg Raise - 10 reps - 1 sets - 3x daily - 7x weekly  Supine Heel Slide with Strap - 10 reps - 1 sets - 3x daily - 7x weekly  Supine Knee Extension Mobilization with Weight - 1 reps - 1 sets - 5-10 min hold - 3x daily - 7x weekly  Wall Squat - 10 reps - 1 sets - 5-10 sec hold - 3x daily - 7x weekly  Standing Single Leg Stance with Counter Support - 5 reps - 1 sets - 10-15 sec hold - 3x daily - 7x weekly

## 2019-03-30 NOTE — Therapy (Signed)
[facility] [address] [place], [place], [address] Phone: [phone]   Fax:  [phone]  Physical Therapy Evaluation  Patient Details  Name: Michael [name] MRN: [id number] Date of Birth: Jun 19, 1960 Referring Provider (PT): Cristie Hem, [place]   Encounter Date: 03/30/2019  PT End of Session - 03/30/19 1056    Visit Number  1    Number of Visits  12    Date for PT Re-Evaluation  05/11/19    PT Start Time  1012    PT Stop Time  1050    PT Time Calculation (min)  38 min    Activity Tolerance  Patient tolerated treatment well    Behavior During Therapy  [facility] for tasks assessed/performed       Past Medical History:  Diagnosis Date  . Arthritis   . Hepatitis    Hep C rx  . Hypertension     Past Surgical History:  Procedure Laterality Date  . COLONOSCOPY    . left knee arthroscopy     . left rotator cuff surgery     . REVISION TOTAL KNEE ARTHROPLASTY Left 06/15/2018  . SHOULDER OPEN ROTATOR CUFF REPAIR Right 01/29/2013   Procedure: RIGHT SHOULDER MINI ROTATOR CUFF REPAIR;  Surgeon: Javier Docker, MD;  [facility]: [facility];  Service: Orthopedics;  Laterality: Right;  With ANCHORS  . TOTAL KNEE ARTHROPLASTY Left 02/12/2017  . TOTAL KNEE ARTHROPLASTY Left 02/12/2017   Procedure: LEFT TOTAL KNEE ARTHROPLASTY;  Surgeon: Tarry Kos, MD;  [facility];  Service: Orthopedics;  Laterality: Left;  . TOTAL KNEE REVISION Left 06/15/2018   Procedure: LEFT TOTAL KNEE REVISION;  Surgeon: Tarry Kos, MD;  [facility];  Service: Orthopedics;  Laterality: Left;  . TOTAL KNEE REVISION Left 03/08/2019   Procedure: LEFT TOTAL KNEE REVISION;  Surgeon: Tarry Kos, MD;  [facility];  Service: Orthopedics;  Laterality: Left;    There were no vitals filed for this visit.   Subjective Assessment - 03/30/19 1016    Subjective  Pt is a 58 y/o male who presents to OPPT s/p Lt TKA revision on 03/08/2019.  Pt reports initial TKA in Sept 2018;  followed by revision in Jan 2020; and most recent revision on 03/08/19.    Patient Stated Goals  improve strength, doesn't want to have to come back in her anymoren    Currently in Pain?  Yes    Pain Score  4    up to 9/10; at best 3/10   Pain Location  Knee    Pain Orientation  Left    Pain Descriptors / Indicators  Sharp;Constant;Aching    Pain Type  Acute pain;Surgical pain    Pain Onset  1 to 4 weeks ago    Pain Frequency  Constant    Aggravating Factors   increased activity, end ranges    Pain Relieving Factors  ice, meds         [facility] PT Assessment - 03/30/19 1021      Assessment   Medical Diagnosis  S/P revision of total knee, left    Referring Provider (PT)  Cristie Hem, PA-C    Onset Date/Surgical Date  03/08/19    Hand Dominance  Right    Next MD Visit  04/27/2019    Prior Therapy  following other knee surgeries      Precautions   Precautions  None      Restrictions   Weight Bearing Restrictions  No      Balance Screen   Has the patient fallen in the past 6 months  No    Has the patient had a decrease in activity level because of a fear of falling?   No    Is the patient reluctant to leave their home because of a fear of falling?   No      Home Public house manager residence    Living Arrangements  Non-relatives/Friends   roommate   Type of Home  House    Home Access  Stairs to enter    Entrance Stairs-Number of Steps  2    Entrance Stairs-Rails  None    Home Layout  One level    Home Equipment  [place] - single point      Prior Function   Level of Independence  Independent    Vocation  Full time employment   currently on disability   Audiological scientist; works 12 hour shifts    Leisure  reading; walking - occasionally plays basketball      Cognition   Overall Cognitive Status  Within Functional Limits for tasks assessed      ROM / Strength   AROM / PROM / Strength  AROM;Strength;PROM       AROM   AROM Assessment Site  Knee    Right/Left Knee  Left    Left Knee Extension  -4    Left Knee Flexion  109      PROM   PROM Assessment Site  Knee    Right/Left Knee  Left    Left Knee Extension  0    Left Knee Flexion  112      Strength   Overall Strength Comments  deferred; limited due to post op status; quad lag present      Palpation   Patella mobility  some tightness with patella mobility      Ambulation/Gait   Gait Pattern  Decreased stance time - left;Decreased step length - right;Antalgic;Decreased hip/knee flexion - left                Objective measurements completed on examination: See above findings.      [facility] Adult PT Treatment/Exercise - 03/30/19 1021      Exercises   Exercises  Knee/Hip      Knee/Hip Exercises: Standing   Wall Squat Limitations  reviewed; pt to continue    SLS  reviewed; pt to continue at home      Knee/Hip Exercises: Supine   Quad Sets  Left;5 reps    Quad Sets Limitations  difficulty with quad activation    Heel Slides  Left;5 reps;AAROM    Heel Prop for Knee Extension Limitations  reviewed; pt to continue at home      Modalities   Modalities  Vasopneumatic      Vasopneumatic   Number Minutes Vasopneumatic   10 minutes    Vasopnuematic Location   Knee    Vasopneumatic Pressure  Medium    Vasopneumatic Temperature   34             PT Education - 03/30/19 1051    Education Details  HEP    Person(s) Educated  Patient    Methods  Explanation;Demonstration;Handout    Comprehension  Verbalized understanding;Returned demonstration;Need further instruction;Verbal cues required          PT Long Term Goals - 03/30/19 1236  PT LONG TERM GOAL #1   Title  He will be independent with all HEP issued    Status  New    Target Date  05/11/19      PT LONG TERM GOAL #2   Title  He will be able to walk up and doiwn stairs with one rail step over step    Status  New    Target Date  05/11/19      PT LONG  TERM GOAL #3   Title  improve Lt knee AROM 0-115 for improved function    Status  New    Target Date  05/11/19      PT LONG TERM GOAL #4   Title  demonstrate improved strength by performing Lt SLR x10 reps without quad lag    Status  New    Target Date  05/11/19      PT LONG TERM GOAL #5   Title  report pain < 3/10 with activity for improved function    Status  New    Target Date  05/11/19             Plan - 03/30/19 1041    Clinical Impression Statement  Pt is a 58 y/o male who presents to OPPT s/p Lt TKA revision on 03/08/19.  Pt demonstrates gait abnormalities, continued pain, and decreased strength and ROM affecting functional mobilty.  Pt will benefit from PT to address deficits listed.    Personal Factors and Comorbidities  Past/Current Experience;Comorbidity 3+    Comorbidities  TKA with revision x 2; HTN, Hep C, arthritis    Examination-Activity Limitations  Bathing;Locomotion Level;Transfers;Stairs;Stand    Examination-Participation Restrictions  Driving   occupation   Stability/Clinical Decision Making  Evolving/Moderate complexity    Clinical Decision Making  Moderate    Rehab Potential  Good    PT Frequency  2x / week    PT Duration  6 weeks    PT Treatment/Interventions  ADLs/Self Care Home Management;Cryotherapy;Electrical Stimulation;Ultrasound;Moist Heat;Iontophoresis 4mg /ml Dexamethasone;Gait training;Stair training;Functional mobility training;Therapeutic activities;Therapeutic exercise;Balance training;Patient/family education;Neuromuscular re-education;Manual techniques;Passive range of motion;Vasopneumatic Device;Taping    PT Next Visit Plan  review HEP, manual/ROM, functional strengthening with focus on quad activation, modalities PRN    PT Home Exercise Plan  Access Code: [email or web address]    Consulted and Agree with Plan of Care  Patient       Patient will benefit from skilled therapeutic intervention in order to improve the following deficits and impairments:   Abnormal gait, Decreased range of motion, Increased fascial restricitons, Difficulty walking, Increased muscle spasms, Decreased balance, Decreased strength, Decreased mobility, Impaired flexibility, Pain, Increased edema  Visit Diagnosis: Acute pain of left knee - Plan: PT plan of care cert/re-cert  Stiffness of left knee - Plan: PT plan of care cert/re-cert  Localized edema - Plan: PT plan of care cert/re-cert  Abnormality of gait - Plan: PT plan of care cert/re-cert     Problem List Patient Active Problem List   Diagnosis Date Noted  . 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 Status post revision of total knee replacement, 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  . Unilateral primary osteoarthritis, left hip 09/08/2018  . Positive depression screening 05/22/2017  . Hepatitis C virus infection cured after antiviral drug therapy 01/19/2017  . Hyperlipidemia 10/23/2016  . Hypertension 10/18/2016  . Tobacco dependence 10/18/2016  . 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 Right rotator cuff tear 01/29/2013      Clarita CraneStephanie F Amenda Duclos, PT,  DPT 03/30/19 12:42 PM     [facility] [address] [place], [place], [address] Phone: [phone]   Fax:  [phone]  Name: Michael [name] MRN: [id number] Date of Birth: 1960/06/23

## 2019-04-02 ENCOUNTER — Other Ambulatory Visit: Payer: Self-pay

## 2019-04-02 ENCOUNTER — Ambulatory Visit (INDEPENDENT_AMBULATORY_CARE_PROVIDER_SITE_OTHER): Payer: BC Managed Care – PPO | Admitting: Physical Therapy

## 2019-04-02 ENCOUNTER — Encounter: Payer: Self-pay | Admitting: Physical Therapy

## 2019-04-02 DIAGNOSIS — R600 Localized edema: Secondary | ICD-10-CM

## 2019-04-02 DIAGNOSIS — M25562 Pain in left knee: Secondary | ICD-10-CM | POA: Diagnosis not present

## 2019-04-02 DIAGNOSIS — R269 Unspecified abnormalities of gait and mobility: Secondary | ICD-10-CM

## 2019-04-02 DIAGNOSIS — M25662 Stiffness of left knee, not elsewhere classified: Secondary | ICD-10-CM

## 2019-04-02 NOTE — Therapy (Signed)
[facility] Physical Therapy [address] [place], [place], [address] Phone: [phone]   Fax:  [phone]  Physical Therapy Treatment  Patient Details  Name: Michael [name] MRN: [id number] Date of Birth: Jan 12, 1961 Referring Provider (PT): Aundra Dubin, [place]   Encounter Date: 04/02/2019  PT End of Session - 04/02/19 0958    Visit Number  2    Number of Visits  12    Date for PT Re-Evaluation  05/11/19    PT Start Time  0923    PT Stop Time  1011    PT Time Calculation (min)  48 min    Activity Tolerance  Patient tolerated treatment well    Behavior During Therapy  [facility] for tasks assessed/performed       Past Medical History:  Diagnosis Date  . Arthritis   . Hepatitis    Hep C rx  . Hypertension     Past Surgical History:  Procedure Laterality Date  . COLONOSCOPY    . left knee arthroscopy     . left rotator cuff surgery     . REVISION TOTAL KNEE ARTHROPLASTY Left 06/15/2018  . SHOULDER OPEN ROTATOR CUFF REPAIR Right 01/29/2013   Procedure: RIGHT SHOULDER MINI ROTATOR CUFF REPAIR;  Surgeon: Johnn Hai, MD;  [facility];  Service: Orthopedics;  Laterality: Right;  With ANCHORS  . TOTAL KNEE ARTHROPLASTY Left 02/12/2017  . TOTAL KNEE ARTHROPLASTY Left 02/12/2017   Procedure: LEFT TOTAL KNEE ARTHROPLASTY;  Surgeon: Leandrew Koyanagi, MD;  Location: [place];  Service: Orthopedics;  Laterality: Left;  . TOTAL KNEE REVISION Left 06/15/2018   Procedure: LEFT TOTAL KNEE REVISION;  Surgeon: Leandrew Koyanagi, MD;  Location: [place];  Service: Orthopedics;  Laterality: Left;  . TOTAL KNEE REVISION Left 03/08/2019   Procedure: LEFT TOTAL KNEE REVISION;  Surgeon: Leandrew Koyanagi, MD;  Location: [place];  Service: Orthopedics;  Laterality: Left;    There were no vitals filed for this visit.  Subjective Assessment - 04/02/19 0926    Subjective  doing well; hurting today.  "It's a good hurt."    Patient Stated Goals  improve strength, doesn't want to have to  come back in her anymoren    Currently in Pain?  Yes    Pain Score  5     Pain Location  Leg   hip to knee   Pain Orientation  Upper    Pain Descriptors / Indicators  Aching;Constant;Sharp    Pain Type  Acute pain;Surgical pain    Pain Onset  1 to 4 weeks ago    Pain Frequency  Constant    Aggravating Factors   increased activity, end ranges    Pain Relieving Factors  ice, meds         [facility] PT Assessment - 04/02/19 0935      Assessment   Medical Diagnosis  S/P revision of total knee, left    Referring Provider (PT)  Aundra Dubin, PA-C    Onset Date/Surgical Date  03/08/19      PROM   Left Knee Flexion  117                   [facility] Adult PT Treatment/Exercise - 04/02/19 0929      Knee/Hip Exercises: Stretches   Passive Hamstring Stretch  Left;3 reps;30 seconds    Passive Hamstring Stretch Limitations  with overpresure at distal thigh for increased extension      Knee/Hip Exercises: Aerobic  Recumbent Bike  L2 x 6 min; seat 8      Knee/Hip Exercises: Standing   Lateral Step Up  Left;2 sets;10 reps;Hand Hold: 0;Hand Hold: 1    Lateral Step Up Limitations  2nd set with Rt hip abduction for glute med activation    Forward Step Up  Left;10 reps;Step Height: 4"    Gait Training  forwards/backwards x 200' working on [employer] and heel strike      Knee/Hip Exercises: Supine   Quad Sets  Left;15 reps    Quad Sets Limitations  improved activation    Heel Slides  Left;15 reps;AAROM      Vasopneumatic   Number Minutes Vasopneumatic   10 minutes    Vasopnuematic Location   Knee    Vasopneumatic Pressure  Medium    Vasopneumatic Temperature   34      Manual Therapy   Manual Therapy  Passive ROM    Passive ROM  Lt knee into extension                  PT Long Term Goals - 03/30/19 1236      PT LONG TERM GOAL #1   Title  He will be independent with all HEP issued    Status  New    Target Date  05/11/19      PT LONG TERM GOAL #2   Title  He will  be able to walk up and doiwn stairs with one rail step over step    Status  New    Target Date  05/11/19      PT LONG TERM GOAL #3   Title  improve Lt knee AROM 0-115 for improved function    Status  New    Target Date  05/11/19      PT LONG TERM GOAL #4   Title  demonstrate improved strength by performing Lt SLR x10 reps without quad lag    Status  New    Target Date  05/11/19      PT LONG TERM GOAL #5   Title  report pain < 3/10 with activity for improved function    Status  New    Target Date  05/11/19            Plan - 04/02/19 0959    Clinical Impression Statement  Pt with improving ROM and quad activation today.  Improved gait mechanics with cues for heel strike and posture.  Overall progressing well with PT, no goals met as only 2nd visit.    Personal Factors and Comorbidities  Past/Current Experience;Comorbidity 3+    Comorbidities  TKA with revision x 2; HTN, Hep C, arthritis    Examination-Activity Limitations  Bathing;Locomotion Level;Transfers;Stairs;Stand    Examination-Participation Restrictions  Driving   occupation   Stability/Clinical Decision Making  Evolving/Moderate complexity    Rehab Potential  Good    PT Frequency  2x / week    PT Duration  6 weeks    PT Treatment/Interventions  ADLs/Self Care Home Management;Cryotherapy;Electrical Stimulation;Ultrasound;Moist Heat;Iontophoresis 32m/ml Dexamethasone;Gait training;Stair training;Functional mobility training;Therapeutic activities;Therapeutic exercise;Balance training;Patient/family education;Neuromuscular re-education;Manual techniques;Passive range of motion;Vasopneumatic Device;Taping    PT Next Visit Plan  manual/ROM, functional strengthening with focus on quad activation, modalities PRN    PT Home Exercise Plan  Access Code: [email or web address]   Consulted and Agree with Plan of Care  Patient       Patient will benefit from skilled therapeutic intervention in order to improve the following deficits and  impairments:  Abnormal gait, Decreased range of motion, Increased fascial restricitons, Difficulty walking, Increased muscle spasms, Decreased balance, Decreased strength, Decreased mobility, Impaired flexibility, Pain, Increased edema  Visit Diagnosis: Acute pain of left knee  Stiffness of left knee  Localized edema  Abnormality of gait     Problem List Patient Active Problem List   Diagnosis Date Noted  . 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 Status post revision of total knee replacement, 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  . Unilateral primary osteoarthritis, left hip 09/08/2018  . Positive depression screening 05/22/2017  . Hepatitis C virus infection cured after antiviral drug therapy 01/19/2017  . Hyperlipidemia 10/23/2016  . Hypertension 10/18/2016  . Tobacco dependence 10/18/2016  . Substance abuse in remission ([place]) 10/18/2016  . Right rotator cuff tear 01/29/2013      Laureen Abrahams, PT, DPT 04/02/19 10:01 AM     [facility] [address] [place], [place], [address] Phone: [phone]   Fax:  [phone]  Name: Michael [name] MRN: [id number] Date of Birth: Jun 26, 1960

## 2019-04-06 ENCOUNTER — Other Ambulatory Visit: Payer: Self-pay

## 2019-04-06 ENCOUNTER — Ambulatory Visit (INDEPENDENT_AMBULATORY_CARE_PROVIDER_SITE_OTHER): Payer: BC Managed Care – PPO | Admitting: Physical Therapy

## 2019-04-06 ENCOUNTER — Encounter: Payer: Self-pay | Admitting: Physical Therapy

## 2019-04-06 DIAGNOSIS — R269 Unspecified abnormalities of gait and mobility: Secondary | ICD-10-CM | POA: Diagnosis not present

## 2019-04-06 DIAGNOSIS — M25562 Pain in left knee: Secondary | ICD-10-CM

## 2019-04-06 DIAGNOSIS — R600 Localized edema: Secondary | ICD-10-CM | POA: Diagnosis not present

## 2019-04-06 DIAGNOSIS — M25662 Stiffness of left knee, not elsewhere classified: Secondary | ICD-10-CM

## 2019-04-06 NOTE — Therapy (Signed)
[facility] [address] [place], [place], [address] Phone: [phone]   Fax:  [phone]  Physical Therapy Treatment  Patient Details  Name: Michael [name] MRN: [id number] Date of Birth: 1960/10/11 Referring Provider (PT): Aundra Dubin, [place]   Encounter Date: 04/06/2019  PT End of Session - 04/06/19 0843    Visit Number  3    Number of Visits  12    Date for PT Re-Evaluation  05/11/19    PT Start Time  0804    PT Stop Time  0854    PT Time Calculation (min)  50 min    Activity Tolerance  Patient tolerated treatment well    Behavior During Therapy  [facility] for tasks assessed/performed       Past Medical History:  Diagnosis Date  . Arthritis   . Hepatitis    Hep C rx  . Hypertension     Past Surgical History:  Procedure Laterality Date  . COLONOSCOPY    . left knee arthroscopy     . left rotator cuff surgery     . REVISION TOTAL KNEE ARTHROPLASTY Left 06/15/2018  . SHOULDER OPEN ROTATOR CUFF REPAIR Right 01/29/2013   Procedure: RIGHT SHOULDER MINI ROTATOR CUFF REPAIR;  Surgeon: Johnn Hai, MD;  [facility];  Service: Orthopedics;  Laterality: Right;  With ANCHORS  . TOTAL KNEE ARTHROPLASTY Left 02/12/2017  . TOTAL KNEE ARTHROPLASTY Left 02/12/2017   Procedure: LEFT TOTAL KNEE ARTHROPLASTY;  Surgeon: Leandrew Koyanagi, MD;  Location: [place];  Service: Orthopedics;  Laterality: Left;  . TOTAL KNEE REVISION Left 06/15/2018   Procedure: LEFT TOTAL KNEE REVISION;  Surgeon: Leandrew Koyanagi, MD;  Location: [place];  Service: Orthopedics;  Laterality: Left;  . TOTAL KNEE REVISION Left 03/08/2019   Procedure: LEFT TOTAL KNEE REVISION;  Surgeon: Leandrew Koyanagi, MD;  Location: [place];  Service: Orthopedics;  Laterality: Left;    There were no vitals filed for this visit.  Subjective Assessment - 04/06/19 0805    Subjective  knee is aching; but overall doing well.  has a concerns about medial knee pain around pes anserine bursa and  swelling.    Patient Stated Goals  improve strength, doesn't want to have to come back in her anymoren    Currently in Pain?  Yes    Pain Score  4     Pain Location  Knee    Pain Orientation  Left    Pain Descriptors / Indicators  Aching;Sharp;Constant    Pain Type  Acute pain;Surgical pain    Pain Onset  1 to 4 weeks ago    Pain Frequency  Constant    Aggravating Factors   increased activity, end ranges    Pain Relieving Factors  ice, meds         [facility] PT Assessment - 04/06/19 0823      Assessment   Medical Diagnosis  S/P revision of total knee, left    Referring Provider (PT)  Aundra Dubin, PA-C    Onset Date/Surgical Date  03/08/19    Next MD Visit  04/27/2019      AROM   Left Knee Extension  0    Left Knee Flexion  118                   [facility] Adult PT Treatment/Exercise - 04/06/19 0805      Knee/Hip Exercises: Stretches   Passive Hamstring Stretch  Left;3 reps;30 seconds  Passive Hamstring Stretch Limitations  with overpresure at distal thigh for increased extension      Knee/Hip Exercises: Aerobic   Recumbent Bike  L2 x 6 min; seat 8      Knee/Hip Exercises: Machines for Strengthening   Total Gym Leg Press  75# 2x10      Knee/Hip Exercises: Standing   Terminal Knee Extension  Left;10 reps;Theraband    Theraband Level (Terminal Knee Extension)  Level 3 (Green)    SLS  LLE: 5x15 sec with RLE in abduction      Knee/Hip Exercises: Seated   Sit to Sand  10 reps;with UE support   from low mat; RLE on 4" step     Knee/Hip Exercises: Supine   Quad Sets  Left;15 reps    Other Supine Knee/Hip Exercises  ball roll hamstring curl AAROM 10x10 sec hold for increased knee flexion      Vasopneumatic   Number Minutes Vasopneumatic   10 minutes    Vasopnuematic Location   Knee    Vasopneumatic Pressure  Medium    Vasopneumatic Temperature   34      Manual Therapy   Passive ROM  Lt knee into extension                  PT Long Term Goals  - 04/06/19 0843      PT LONG TERM GOAL #1   Title  He will be independent with all HEP issued    Status  On-going    Target Date  05/11/19      PT LONG TERM GOAL #2   Title  He will be able to walk up and doiwn stairs with one rail step over step    Status  On-going      PT LONG TERM GOAL #3   Title  improve Lt knee AROM 0-115 for improved function    Status  Achieved      PT LONG TERM GOAL #4   Title  demonstrate improved strength by performing Lt SLR x10 reps without quad lag    Status  On-going      PT LONG TERM GOAL #5   Title  report pain < 3/10 with activity for improved function    Status  On-going            Plan - 04/06/19 0843    Clinical Impression Statement  Pt has met ROM LTG and is progressing well with PT.  Still with difficulty with SLR and quad activation,which is improving slowly.  Will continue to benefit from PT to address deficits.    Personal Factors and Comorbidities  Past/Current Experience;Comorbidity 3+    Comorbidities  TKA with revision x 2; HTN, Hep C, arthritis    Examination-Activity Limitations  Bathing;Locomotion Level;Transfers;Stairs;Stand    Examination-Participation Restrictions  Driving   occupation   Stability/Clinical Decision Making  Evolving/Moderate complexity    Rehab Potential  Good    PT Frequency  2x / week    PT Duration  6 weeks    PT Treatment/Interventions  ADLs/Self Care Home Management;Cryotherapy;Electrical Stimulation;Ultrasound;Moist Heat;Iontophoresis 69m/ml Dexamethasone;Gait training;Stair training;Functional mobility training;Therapeutic activities;Therapeutic exercise;Balance training;Patient/family education;Neuromuscular re-education;Manual techniques;Passive range of motion;Vasopneumatic Device;Taping    PT Next Visit Plan  manual/ROM, functional strengthening with focus on quad activation, modalities PRN    PT Home Exercise Plan  Access Code: [email or web address]   Consulted and Agree with Plan of Care  Patient        Patient will benefit  from skilled therapeutic intervention in order to improve the following deficits and impairments:  Abnormal gait, Decreased range of motion, Increased fascial restricitons, Difficulty walking, Increased muscle spasms, Decreased balance, Decreased strength, Decreased mobility, Impaired flexibility, Pain, Increased edema  Visit Diagnosis: Acute pain of left knee  Stiffness of left knee  Localized edema  Abnormality of gait     Problem List Patient Active Problem List   Diagnosis Date Noted  . 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 Status post revision of total knee replacement, 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  . Unilateral primary osteoarthritis, left hip 09/08/2018  . Positive depression screening 05/22/2017  . Hepatitis C virus infection cured after antiviral drug therapy 01/19/2017  . Hyperlipidemia 10/23/2016  . Hypertension 10/18/2016  . Tobacco dependence 10/18/2016  . Substance abuse in remission ([place]) 10/18/2016  . Right rotator cuff tear 01/29/2013      Laureen Abrahams, PT, DPT 04/06/19 8:45 AM    [facility] [address] [place], [place], [address] Phone: [phone]   Fax:  [phone]  Name: ALANMICHAEL BARMORE MRN: [id number] Date of Birth: 1961-01-25

## 2019-04-07 ENCOUNTER — Encounter: Payer: Self-pay | Admitting: Orthopaedic Surgery

## 2019-04-08 ENCOUNTER — Encounter: Payer: Self-pay | Admitting: Physical Therapy

## 2019-04-08 ENCOUNTER — Other Ambulatory Visit: Payer: Self-pay

## 2019-04-08 ENCOUNTER — Ambulatory Visit (INDEPENDENT_AMBULATORY_CARE_PROVIDER_SITE_OTHER): Payer: BC Managed Care – PPO | Admitting: Physical Therapy

## 2019-04-08 DIAGNOSIS — R269 Unspecified abnormalities of gait and mobility: Secondary | ICD-10-CM

## 2019-04-08 DIAGNOSIS — R600 Localized edema: Secondary | ICD-10-CM | POA: Diagnosis not present

## 2019-04-08 DIAGNOSIS — M25562 Pain in left knee: Secondary | ICD-10-CM

## 2019-04-08 DIAGNOSIS — M25662 Stiffness of left knee, not elsewhere classified: Secondary | ICD-10-CM

## 2019-04-08 NOTE — Therapy (Signed)
[facility] Physical Therapy [address] [place], [place], [address] Phone: [phone]   Fax:  [phone]  Physical Therapy Treatment  Patient Details  Name: MILLAN LEGAN MRN: [id number] Date of Birth: December 16, 1960 Referring Provider (PT): Aundra Dubin, [place]   Encounter Date: 04/08/2019  PT End of Session - 04/08/19 0842    Visit Number  4    Number of Visits  12    Date for PT Re-Evaluation  05/11/19    PT Start Time  0804    PT Stop Time  0848    PT Time Calculation (min)  44 min    Activity Tolerance  Patient tolerated treatment well    Behavior During Therapy  [facility] for tasks assessed/performed       Past Medical History:  Diagnosis Date  . Arthritis   . Hepatitis    Hep C rx  . Hypertension     Past Surgical History:  Procedure Laterality Date  . COLONOSCOPY    . left knee arthroscopy     . left rotator cuff surgery     . REVISION TOTAL KNEE ARTHROPLASTY Left 06/15/2018  . SHOULDER OPEN ROTATOR CUFF REPAIR Right 01/29/2013   Procedure: RIGHT SHOULDER MINI ROTATOR CUFF REPAIR;  Surgeon: Johnn Hai, MD;  Location: [facility];  Service: Orthopedics;  Laterality: Right;  With ANCHORS  . TOTAL KNEE ARTHROPLASTY Left 02/12/2017  . TOTAL KNEE ARTHROPLASTY Left 02/12/2017   Procedure: LEFT TOTAL KNEE ARTHROPLASTY;  Surgeon: Leandrew Koyanagi, MD;  Location: [place];  Service: Orthopedics;  Laterality: Left;  . TOTAL KNEE REVISION Left 06/15/2018   Procedure: LEFT TOTAL KNEE REVISION;  Surgeon: Leandrew Koyanagi, MD;  Location: [place];  Service: Orthopedics;  Laterality: Left;  . TOTAL KNEE REVISION Left 03/08/2019   Procedure: LEFT TOTAL KNEE REVISION;  Surgeon: Leandrew Koyanagi, MD;  Location: [place];  Service: Orthopedics;  Laterality: Left;    There were no vitals filed for this visit.  Subjective Assessment - 04/08/19 0806    Subjective  doing well    Patient Stated Goals  improve strength, doesn't want to have to come back in her anymoren    Currently in Pain?  Yes    Pain Score  4     Pain Location  Knee    Pain Orientation  Left    Pain Descriptors / Indicators  Aching;Sharp;Constant    Pain Type  Acute pain;Surgical pain    Pain Onset  1 to 4 weeks ago    Pain Frequency  Constant    Aggravating Factors   increased activity, end ranges    Pain Relieving Factors  ice, meds         [facility] PT Assessment - 04/08/19 0810      Assessment   Medical Diagnosis  S/P revision of total knee, left    Referring Provider (PT)  Aundra Dubin, PA-C    Onset Date/Surgical Date  03/08/19    Next MD Visit  04/27/2019                   [facility] Adult PT Treatment/Exercise - 04/08/19 0807      Knee/Hip Exercises: Stretches   Passive Hamstring Stretch  Left;3 reps;30 seconds    Passive Hamstring Stretch Limitations  with overpresure at distal thigh for increased extension      Knee/Hip Exercises: Aerobic   Recumbent Bike  L2 x 6 min; seat 6      Knee/Hip  Exercises: Machines for Strengthening   Total Gym Leg Press  75# 2x10      Knee/Hip Exercises: Standing   Terminal Knee Extension  Left;15 reps;Theraband    Theraband Level (Terminal Knee Extension)  Level 4 (Blue)    Lateral Step Up  Left;10 reps;Hand Hold: 0;Step Height: 6"    Forward Step Up  Left;10 reps;Hand Hold: 0;Step Height: 6"    Functional Squat  15 reps    Functional Squat Limitations  on rockerboard laterally    SLS  LLE: 5x15 sec with RLE in abduction    Gait Training  forwards/backwards x 200' working on [employer] and heel strike      Knee/Hip Exercises: Supine   Straight Leg Raises  Left;15 reps    Straight Leg Raises Limitations  3#    Other Supine Knee/Hip Exercises  ball roll hamstring curl AAROM 10x10 sec hold for increased knee flexion      Vasopneumatic   Number Minutes Vasopneumatic   10 minutes    Vasopnuematic Location   Knee    Vasopneumatic Pressure  Medium    Vasopneumatic Temperature   34      Manual Therapy   Passive ROM  Lt knee into  extension                  PT Long Term Goals - 04/06/19 0843      PT LONG TERM GOAL #1   Title  He will be independent with all HEP issued    Status  On-going    Target Date  05/11/19      PT LONG TERM GOAL #2   Title  He will be able to walk up and doiwn stairs with one rail step over step    Status  On-going      PT LONG TERM GOAL #3   Title  improve Lt knee AROM 0-115 for improved function    Status  Achieved      PT LONG TERM GOAL #4   Title  demonstrate improved strength by performing Lt SLR x10 reps without quad lag    Status  On-going      PT LONG TERM GOAL #5   Title  report pain < 3/10 with activity for improved function    Status  On-going            Plan - 04/08/19 0843    Clinical Impression Statement  Pt continues to demonstrate improved functional mobiltiy and tolerated increased strengthening exercises well today.  Plan to progress HEP next session.    Personal Factors and Comorbidities  Past/Current Experience;Comorbidity 3+    Comorbidities  TKA with revision x 2; HTN, Hep C, arthritis    Examination-Activity Limitations  Bathing;Locomotion Level;Transfers;Stairs;Stand    Examination-Participation Restrictions  Driving   occupation   Stability/Clinical Decision Making  Evolving/Moderate complexity    Rehab Potential  Good    PT Frequency  2x / week    PT Duration  6 weeks    PT Treatment/Interventions  ADLs/Self Care Home Management;Cryotherapy;Electrical Stimulation;Ultrasound;Moist Heat;Iontophoresis 4mg /ml Dexamethasone;Gait training;Stair training;Functional mobility training;Therapeutic activities;Therapeutic exercise;Balance training;Patient/family education;Neuromuscular re-education;Manual techniques;Passive range of motion;Vasopneumatic Device;Taping    PT Next Visit Plan  manual/ROM, functional strengthening with focus on quad activation, modalities PRN; progress strengthening HEP    PT Home Exercise Plan  Access Code:     Consulted and Agree with Plan of Care  Patient       Patient will benefit from skilled therapeutic intervention  in order to improve the following deficits and impairments:  Abnormal gait, Decreased range of motion, Increased fascial restricitons, Difficulty walking, Increased muscle spasms, Decreased balance, Decreased strength, Decreased mobility, Impaired flexibility, Pain, Increased edema  Visit Diagnosis: Acute pain of left knee  Stiffness of left knee  Localized edema  Abnormality of gait     Problem List Patient Active Problem List   Diagnosis Date Noted  . 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 Status post revision of total knee replacement, 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  . Unilateral primary osteoarthritis, left hip 09/08/2018  . Positive depression screening 05/22/2017  . Hepatitis C virus infection cured after antiviral drug therapy 01/19/2017  . Hyperlipidemia 10/23/2016  . Hypertension 10/18/2016  . Tobacco dependence 10/18/2016  . 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 Right rotator cuff tear 01/29/2013      Clarita CraneStephanie F Gregery Walberg, PT, DPT 04/08/19 8:44 AM     [facility] [address] [place], [place], [address] Phone: [phone]   Fax:  [phone]  Name: [name] D Cove MRN: [id number] Date of Birth: 04/21/1961

## 2019-04-10 ENCOUNTER — Other Ambulatory Visit: Payer: Self-pay | Admitting: Physician Assistant

## 2019-04-13 ENCOUNTER — Encounter: Payer: BC Managed Care – PPO | Admitting: Physical Therapy

## 2019-04-16 ENCOUNTER — Encounter: Payer: BC Managed Care – PPO | Admitting: Physical Therapy

## 2019-04-19 ENCOUNTER — Encounter: Payer: BC Managed Care – PPO | Admitting: Physical Therapy

## 2019-04-20 ENCOUNTER — Encounter: Payer: BC Managed Care – PPO | Admitting: Physical Therapy

## 2019-04-26 ENCOUNTER — Encounter: Payer: BC Managed Care – PPO | Admitting: Physical Therapy

## 2019-04-27 ENCOUNTER — Ambulatory Visit (INDEPENDENT_AMBULATORY_CARE_PROVIDER_SITE_OTHER): Payer: BC Managed Care – PPO | Admitting: Orthopaedic Surgery

## 2019-04-27 ENCOUNTER — Telehealth: Payer: Self-pay

## 2019-04-27 ENCOUNTER — Ambulatory Visit (INDEPENDENT_AMBULATORY_CARE_PROVIDER_SITE_OTHER): Payer: BC Managed Care – PPO

## 2019-04-27 ENCOUNTER — Encounter: Payer: Self-pay | Admitting: Orthopaedic Surgery

## 2019-04-27 ENCOUNTER — Encounter: Payer: Self-pay | Admitting: Internal Medicine

## 2019-04-27 ENCOUNTER — Other Ambulatory Visit: Payer: Self-pay

## 2019-04-27 DIAGNOSIS — Z96652 Presence of left artificial knee joint: Secondary | ICD-10-CM | POA: Diagnosis not present

## 2019-04-27 MED ORDER — HYDROCODONE-ACETAMINOPHEN 5-325 MG PO TABS: 1.0000 | ORAL_TABLET | Freq: Two times a day (BID) | ORAL | 0 refills | Status: DC | PRN

## 2019-04-27 NOTE — Progress Notes (Signed)
Post-Op Visit Note   Patient: Michael [name]           Date of Birth: 1961/04/06           MRN: [id number] Visit Date: 04/27/2019 PCP: Ladell Pier, MD   Assessment & Plan:  Chief Complaint:  Chief Complaint  Patient presents with  . Left Knee - Pain   Visit Diagnoses:  1. S/P revision of total knee, left     Plan: Patient is a pleasant 58 year old gentleman who presents our clinic today little over 6 weeks left total knee revision, date of surgery 03/08/2019.  He has been doing fairly well.  His main issue has been trying to get approval for more physical therapy as he only had 3 therapy sessions left following the surgical procedure.  At this point, we have contacted the physical therapy department upstairs to states they have sent a letter to his insurance company and have also tried calling them on the phone.  They will retry calling the insurance company as soon as they are able to.  In the meantime, the patient will continue with a home exercise program.  Follow-up with [place] in 6 weeks time for repeat evaluation and 2 view x-rays of the left knee.  Follow-Up Instructions: Return in about 6 weeks (around 06/08/2019).   Orders:  Orders Placed This Encounter  Procedures  . XR Knee 1-2 Views Left   Meds ordered this encounter  Medications  . HYDROcodone-acetaminophen (NORCO) 5-325 MG tablet    Sig: Take 1-2 tablets by mouth 2 (two) times daily as needed for moderate pain.    Dispense:  20 tablet    Refill:  0    Imaging: Xr Knee 1-2 Views Left  Result Date: 04/27/2019 Well-seated prosthesis without complication   PMFS History: Patient Active Problem List   Diagnosis Date Noted  . 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 Status post revision of total knee replacement, 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  . Unilateral primary osteoarthritis, left hip 09/08/2018  . Positive depression screening 05/22/2017  . Hepatitis C virus infection cured  after antiviral drug therapy 01/19/2017  . Hyperlipidemia 10/23/2016  . Hypertension 10/18/2016  . Tobacco dependence 10/18/2016  . Substance abuse in remission ([place]) 10/18/2016  . Right rotator cuff tear 01/29/2013   Past Medical History:  Diagnosis Date  . Arthritis   . Hepatitis    Hep C rx  . Hypertension     Family History  Problem Relation Age of Onset  . CAD Mother   . Deep vein thrombosis Brother     Past Surgical History:  Procedure Laterality Date  . COLONOSCOPY    . left knee arthroscopy     . left rotator cuff surgery     . REVISION TOTAL KNEE ARTHROPLASTY Left 06/15/2018  . SHOULDER OPEN ROTATOR CUFF REPAIR Right 01/29/2013   Procedure: RIGHT SHOULDER MINI ROTATOR CUFF REPAIR;  Surgeon: Johnn Hai, MD;  Location: [facility];  Service: Orthopedics;  Laterality: Right;  With ANCHORS  . TOTAL KNEE ARTHROPLASTY Left 02/12/2017  . TOTAL KNEE ARTHROPLASTY Left 02/12/2017   Procedure: LEFT TOTAL KNEE ARTHROPLASTY;  Surgeon: Leandrew Koyanagi, MD;  Location: [place];  Service: Orthopedics;  Laterality: Left;  . TOTAL KNEE REVISION Left 06/15/2018   Procedure: LEFT TOTAL KNEE REVISION;  Surgeon: Leandrew Koyanagi, MD;  Location: [place];  Service: Orthopedics;  Laterality: Left;  . TOTAL KNEE REVISION Left 03/08/2019   Procedure:  LEFT TOTAL KNEE REVISION;  Surgeon: Tarry Kos, MD;  Location: [facility];  Service: Orthopedics;  Laterality: Left;   Social History   Occupational History  . Occupation: Chartered certified accountant  Tobacco Use  . Smoking status: Current Every Day Smoker    Packs/day: 1.00    Years: 37.00    Pack years: 37.00    Types: Cigarettes  . Smokeless tobacco: Never Used  Substance and Sexual Activity  . Alcohol use: Not Currently  . Drug use: No    Comment: hx of marijuana and cocaine, LSD  . Sexual activity: Yes    Partners: Female    Birth control/protection: None

## 2019-04-27 NOTE — Telephone Encounter (Signed)
Patient came in for appointment today states his ins [employer] [place] has not received any paperwork. Per Varney Biles she faxed and received confirmation. She had previously contacted [employer] and was on hold 40 mins plus they kept transferring her. Patient needs to get PT aprrovals ASAP. He is S/P L TKA revision. Altamese Dilling states she will retry calling [employer] when she gets a chance.

## 2019-04-28 ENCOUNTER — Encounter: Payer: BC Managed Care – PPO | Admitting: Physical Therapy

## 2019-04-30 ENCOUNTER — Ambulatory Visit: Payer: BC Managed Care – PPO | Admitting: Internal Medicine

## 2019-04-30 ENCOUNTER — Other Ambulatory Visit: Payer: Self-pay

## 2019-04-30 ENCOUNTER — Encounter: Payer: Self-pay | Admitting: Internal Medicine

## 2019-04-30 NOTE — Telephone Encounter (Signed)
Please contact and reschedule patient.

## 2019-05-07 ENCOUNTER — Ambulatory Visit (HOSPITAL_BASED_OUTPATIENT_CLINIC_OR_DEPARTMENT_OTHER): Payer: BC Managed Care – PPO | Admitting: Internal Medicine

## 2019-05-07 ENCOUNTER — Encounter: Payer: Self-pay | Admitting: Internal Medicine

## 2019-05-07 ENCOUNTER — Other Ambulatory Visit: Payer: Self-pay

## 2019-05-07 ENCOUNTER — Ambulatory Visit (HOSPITAL_COMMUNITY)
Admission: RE | Admit: 2019-05-07 | Discharge: 2019-05-07 | Disposition: A | Discharge status: Home / Self Care | Payer: BC Managed Care – PPO | Source: Ambulatory Visit | Attending: Internal Medicine | Admitting: Internal Medicine

## 2019-05-07 DIAGNOSIS — M79662 Pain in left lower leg: Secondary | ICD-10-CM

## 2019-05-07 DIAGNOSIS — E785 Hyperlipidemia, unspecified: Secondary | ICD-10-CM | POA: Diagnosis not present

## 2019-05-07 DIAGNOSIS — R82998 Other abnormal findings in urine: Secondary | ICD-10-CM

## 2019-05-07 DIAGNOSIS — I10 Essential (primary) hypertension: Secondary | ICD-10-CM

## 2019-05-07 DIAGNOSIS — Z23 Encounter for immunization: Secondary | ICD-10-CM

## 2019-05-07 DIAGNOSIS — R739 Hyperglycemia, unspecified: Secondary | ICD-10-CM

## 2019-05-07 DIAGNOSIS — F17200 Nicotine dependence, unspecified, uncomplicated: Secondary | ICD-10-CM | POA: Diagnosis not present

## 2019-05-07 NOTE — Progress Notes (Addendum)
Virtual Visit via Telephone Note Due to current restrictions/limitations of in-office visits due to the COVID-19 pandemic, this scheduled clinical appointment was converted to a telehealth visit  I connected with Michael [name] on 05/07/19 at 12:50 p.m by telephone and verified that I am speaking with the correct person using two identifiers. I am in my office.  The patient is at home.  Only the patient and myself participated in this encounter.  I discussed the limitations, risks, security and privacy concerns of performing an evaluation and management service by telephone and the availability of in person appointments. I also discussed with the patient that there may be a patient responsible charge related to this service. The patient expressed understanding and agreed to proceed.   History of Present Illness: Pt with hx ofHTN,OA LT hip and lumbar spine,Hep C treated with interferon/rib combo in [place] 2005, tobacco abuse, substance use disorderwas on Methadone in past , HL  Since last visit, pt had revision of LT TKR 02/2019. C/o having pain and swelling in LT calf x 3 wks  HTN:  Compliant with BP meds and limits salt in foods Checks BP QOD.  Reports range 130/70s No CP/SOB/HA  HL:  Tolerating Lipitor  Tob Dep:  Still at 1/2 pk/day.  Feels once he is able to get back to walking again he will be ready to give a trial of quitting.  He is not ready to give a trial at this time.  Notice his urine has been foaming.  No dysuria. Mild elevation in blood sugars noted on chemistry from recent hospitalization.  Last time we checked A1c it was normal. Outpatient Encounter Medications as of 05/07/2019  Medication Sig  . amLODipine (NORVASC) 10 MG tablet Take 1 tablet (10 mg total) by mouth daily.  [name] aspirin 81 MG chewable tablet Chew 1 tablet (81 mg total) by mouth 2 (two) times daily.  [name] atorvastatin (LIPITOR) 10 MG tablet Take 1 tablet (10 mg total) by mouth daily.  . bisacodyl (DULCOLAX) 5  MG EC tablet Take 1 tablet (5 mg total) by mouth daily as needed for moderate constipation. (Patient not taking: Reported on 03/30/2019)  . HYDROcodone-acetaminophen (NORCO) 5-325 MG tablet Take 1-2 tablets by mouth 2 (two) times daily as needed for moderate pain. (Patient not taking: Reported on 05/07/2019)  . lisinopril-hydrochlorothiazide (ZESTORETIC) 20-25 MG tablet Take 1 tablet by mouth daily.  . methocarbamol (ROBAXIN) 500 MG tablet Take 1 tablet (500 mg total) by mouth 2 (two) times daily as needed for muscle spasms. (Patient not taking: Reported on 03/30/2019)  . ondansetron (ZOFRAN) 4 MG tablet Take 1 tablet (4 mg total) by mouth every 8 (eight) hours as needed for nausea or vomiting. (Patient not taking: Reported on 03/30/2019)  . oxyCODONE (ROXICODONE) 5 MG immediate release tablet Take 1-2 tabs po q 6-8 hours prn pain (Patient not taking: Reported on 03/30/2019)  . oxyCODONE-acetaminophen (PERCOCET) 5-325 MG tablet Take 1-2 tablets by mouth every 8 (eight) hours as needed for severe pain. (Patient not taking: Reported on 05/07/2019)  . psyllium (METAMUCIL) 58.6 % powder Take 1 packet by mouth daily.  . traZODone (DESYREL) 50 MG tablet Take 0.5 tablets (25 mg total) by mouth at bedtime as needed for sleep.   Facility-Administered Encounter Medications as of 05/07/2019  Medication  . methylPREDNISolone acetate (DEPO-MEDROL) injection 40 mg    Observations/Objective: Lab Results  Component Value Date   WBC 13.6 (H) 03/09/2019   HGB 13.5 03/09/2019   HCT 39.1 03/09/2019  MCV 88.7 03/09/2019   PLT 260 03/09/2019     Chemistry      Component Value Date/Time   NA 137 03/09/2019 0235   NA 137 03/02/2018 0934   K 4.3 03/09/2019 0235   CL 101 03/09/2019 0235   CO2 24 03/09/2019 0235   BUN 12 03/09/2019 0235   BUN 16 03/02/2018 0934   CREATININE 0.88 03/09/2019 0235      Component Value Date/Time   CALCIUM 9.2 03/09/2019 0235   ALKPHOS 80 03/02/2019 0927   AST 24 03/02/2019 0927    ALT 28 03/02/2019 0927   BILITOT 0.9 03/02/2019 0927   BILITOT 0.3 03/02/2018 0934       Assessment and Plan: 1. Essential hypertension Reported blood sugar is close to goal.  He will continue current medications of amlodipine and lisinopril/HCTZ. Continue low-salt diet   2. Pain of left calf We will have him get Doppler ultrasound today to evaluate for DVT.  At risk given recent orthopedic surgery on the knee - VAS [place] LOWER EXTREMITY VENOUS (DVT); Future  3. Tobacco dependence Advised to quit.  Patient not ready to give a trial of quitting.  He is aware of health risks associated with smoking.  Less than 5 minutes spent on counseling.  4. Hyperlipidemia, unspecified hyperlipidemia type Continue atorvastatin - Lipid panel; Future  5. Foamy urine - Microalbumin / creatinine urine ratio; Future  6. Hyperglycemia - Hemoglobin A1c; Future  7. Need for influenza vaccination CMA to schedule appointment for him to come and have flu vaccine   Follow Up Instructions: 3 mths   I discussed the assessment and treatment plan with the patient. The patient was provided an opportunity to ask questions and all were answered. The patient agreed with the plan and demonstrated an understanding of the instructions.   The patient was advised to call back or seek an in-person evaluation if the symptoms worsen or if the condition fails to improve as anticipated.  Addendum:  Pt called and informed that ultrasound of the left lower extremity was negative for DVT.  Patient thanked me for calling. I provided 10 minutes of non-face-to-face time during this encounter.   Karle Plumber, MD

## 2019-05-07 NOTE — Progress Notes (Signed)
Pt states the pain is mostly in his calf

## 2019-05-10 ENCOUNTER — Other Ambulatory Visit: Payer: Self-pay

## 2019-05-10 ENCOUNTER — Ambulatory Visit: Payer: BC Managed Care – PPO | Attending: Internal Medicine | Admitting: Pharmacist

## 2019-05-10 DIAGNOSIS — Z23 Encounter for immunization: Secondary | ICD-10-CM | POA: Diagnosis not present

## 2019-05-10 NOTE — Progress Notes (Signed)
Patient presents for vaccination against influenza per orders of Dr. Johnson. Consent given. Counseling provided. No contraindications exists. Vaccine administered without incident.   

## 2019-05-15 ENCOUNTER — Encounter: Payer: Self-pay | Admitting: Emergency Medicine

## 2019-05-15 ENCOUNTER — Other Ambulatory Visit: Payer: Self-pay

## 2019-05-15 ENCOUNTER — Ambulatory Visit
Admission: EM | Admit: 2019-05-15 | Discharge: 2019-05-15 | Disposition: A | Discharge status: Home / Self Care | Payer: BC Managed Care – PPO | Attending: Physician Assistant | Admitting: Physician Assistant

## 2019-05-15 DIAGNOSIS — F17210 Nicotine dependence, cigarettes, uncomplicated: Secondary | ICD-10-CM | POA: Diagnosis not present

## 2019-05-15 DIAGNOSIS — K0889 Other specified disorders of teeth and supporting structures: Secondary | ICD-10-CM | POA: Diagnosis not present

## 2019-05-15 MED ORDER — IBUPROFEN 800 MG PO TABS: 800.0000 mg | ORAL_TABLET | Freq: Three times a day (TID) | ORAL | 0 refills | Status: DC

## 2019-05-15 MED ORDER — AMOXICILLIN-POT CLAVULANATE 875-125 MG PO TABS: 1.0000 | ORAL_TABLET | Freq: Two times a day (BID) | ORAL | 0 refills | Status: DC

## 2019-05-15 NOTE — ED Provider Notes (Signed)
[facility]    CSN: [id number] Arrival date & time: 05/15/19  0815      History   Chief Complaint Chief Complaint  Patient presents with  . Dental Pain    HPI Michael [name] is a 58 y.o. male.   58 year old male comes in for 2 day history of right upper tooth pain.  He has known broken tooth to the right upper jaw that occurred about 5 months ago.  States has been evaluated by dentist, and was scheduled for procedure, but canceled due to Covid.  He has been trying to get appointment since.  States for the past 2 days, has had gum swelling, pain to the right tooth.  Denies swelling to throat, tripoding, drooling, trismus.  Denies fever, chills, body aches. Has been trying warm compress with some relief.      Past Medical History:  Diagnosis Date  . Arthritis   . Hepatitis    Hep C rx  . Hypertension     Patient Active Problem List   Diagnosis Date Noted  . 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 Status post revision of total knee replacement, 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  . Unilateral primary osteoarthritis, left hip 09/08/2018  . Positive depression screening 05/22/2017  . Hepatitis C virus infection cured after antiviral drug therapy 01/19/2017  . Hyperlipidemia 10/23/2016  . Hypertension 10/18/2016  . Tobacco dependence 10/18/2016  . 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 Right rotator cuff tear 01/29/2013    Past Surgical History:  Procedure Laterality Date  . COLONOSCOPY    . left knee arthroscopy     . left rotator cuff surgery     . REVISION TOTAL KNEE ARTHROPLASTY Left 06/15/2018  . SHOULDER OPEN ROTATOR CUFF REPAIR Right 01/29/2013   Procedure: RIGHT SHOULDER MINI ROTATOR CUFF REPAIR;  Surgeon: Javier DockerJeffrey C Beane, MD;  Location: [facility];  Service: Orthopedics;  Laterality: Right;  With ANCHORS  . TOTAL KNEE ARTHROPLASTY Left 02/12/2017  . TOTAL KNEE ARTHROPLASTY Left 02/12/2017   Procedure: LEFT TOTAL KNEE  ARTHROPLASTY;  Surgeon: Tarry KosXu, Naiping M, MD;  Location: [facility];  Service: Orthopedics;  Laterality: Left;  . TOTAL KNEE REVISION Left 06/15/2018   Procedure: LEFT TOTAL KNEE REVISION;  Surgeon: Tarry KosXu, Naiping M, MD;  Location: [facility];  Service: Orthopedics;  Laterality: Left;  . TOTAL KNEE REVISION Left 03/08/2019   Procedure: LEFT TOTAL KNEE REVISION;  Surgeon: Tarry KosXu, Naiping M, MD;  Location: [facility];  Service: Orthopedics;  Laterality: Left;       Home Medications    Prior to Admission medications   Medication Sig Start Date End Date Taking? Authorizing Provider  amLODipine (NORVASC) 10 MG tablet Take 1 tablet (10 mg total) by mouth daily. 01/26/19   Anders SimmondsMcClung, Angela M, PA-C  amoxicillin-clavulanate (AUGMENTIN) 875-125 MG tablet Take 1 tablet by mouth every 12 (twelve) hours. 05/15/19   Belinda FisherYu, Cartha Rotert V, PA-C  aspirin 81 MG chewable tablet Chew 1 tablet (81 mg total) by mouth 2 (two) times daily. 03/09/19   Cristie HemStanbery, Mary L, PA-C  atorvastatin (LIPITOR) 10 MG tablet Take 1 tablet (10 mg total) by mouth daily. 01/26/19   Anders SimmondsMcClung, Angela M, PA-C  ibuprofen (ADVIL) 800 MG tablet Take 1 tablet (800 mg total) by mouth 3 (three) times daily. 05/15/19   Cathie HoopsYu, Alacia Rehmann V, PA-C  lisinopril-hydrochlorothiazide (ZESTORETIC) 20-25 MG tablet Take 1 tablet by mouth daily. 01/26/19   Anders SimmondsMcClung, Angela M, PA-C  oxyCODONE (  ROXICODONE) 5 MG immediate release tablet Take 1-2 tabs po q 6-8 hours prn pain Patient not taking: Reported on 03/30/2019 03/15/19   Cristie Hem, PA-C  psyllium (METAMUCIL) 58.6 % powder Take 1 packet by mouth daily.    [provider]  traZODone (DESYREL) 50 MG tablet Take 0.5 tablets (25 mg total) by mouth at bedtime as needed for sleep. 01/26/19   Anders Simmonds, PA-C    Family History Family History  Problem Relation Age of Onset  . CAD Mother   . Deep vein thrombosis Brother     Social History Social History   Tobacco Use  . Smoking status: Current Every Day Smoker    Packs/day: 1.00     Years: 37.00    Pack years: 37.00    Types: Cigarettes  . Smokeless tobacco: Never Used  Substance Use Topics  . Alcohol use: Not Currently  . Drug use: No    Comment: hx of marijuana and cocaine, LSD     Allergies   Chantix [varenicline]   Review of Systems Review of Systems  Reason unable to perform ROS: See HPI as above.     Physical Exam Triage Vital Signs ED Triage Vitals  Enc Vitals Group     BP 05/15/19 0828 (!) 177/88     Pulse Rate 05/15/19 0828 (!) 112     Resp 05/15/19 0828 18     Temp 05/15/19 0828 98.1 F (36.7 C)     Temp src --      SpO2 05/15/19 0828 96 %     Weight --      Height --      Head Circumference --      Peak Flow --      Pain Score 05/15/19 0829 8     Pain Loc --      Pain Edu? --      Excl. in [place]? --    No data found.  Updated Vital Signs BP (!) 177/88   Pulse (!) 112   Temp 98.1 F (36.7 C)   Resp 18   SpO2 96%   Physical Exam Constitutional:      General: He is not in acute distress.    Appearance: He is well-developed. He is not ill-appearing, toxic-appearing or diaphoretic.  HENT:     Head: Normocephalic and atraumatic.     Jaw: No trismus.     Mouth/Throat:     Mouth: Mucous membranes are moist.     Pharynx: Oropharynx is clear. Uvula midline. No uvula swelling.     Tonsils: No tonsillar exudate.      Comments: Tenderness to palpation along frontal/right upper gumline. No fluctuance felt.  Floor of mouth soft to palpation. No facial swelling.  Musculoskeletal:     Cervical back: Normal range of motion and neck supple.  Skin:    General: Skin is warm and dry.  Neurological:     Mental Status: He is alert and oriented to person, place, and time.      UC Treatments / Results  Labs (all labs ordered are listed, but only abnormal results are displayed) Labs Reviewed - No data to display  EKG   Radiology No results found.  Procedures Procedures (including critical care time)  Medications Ordered in  UC Medications - No data to display  Initial Impression / Assessment and Plan / UC Course  I have reviewed the triage vital signs and the nursing notes.  Pertinent labs & imaging  results that were available during my care of the patient were reviewed by me and considered in my medical decision making (see chart for details).    Start antibiotics for possible dental infection. Symptomatic treatment as needed. Discussed with patient symptoms can return if dental problem is not addressed. Follow up with dentist for further evaluation and treatment of dental pain. Resources given. Return precautions given.   Final Clinical Impressions(s) / UC Diagnoses   Final diagnoses:  Pain, dental   ED Prescriptions    Medication Sig Dispense Auth. Provider   amoxicillin-clavulanate (AUGMENTIN) 875-125 MG tablet Take 1 tablet by mouth every 12 (twelve) hours. 14 tablet Henleigh Robello V, PA-C   ibuprofen (ADVIL) 800 MG tablet Take 1 tablet (800 mg total) by mouth 3 (three) times daily. 21 tablet Ok Edwards, PA-C     PDMP not reviewed this encounter.   Ok Edwards, PA-C 05/15/19 [phone]

## 2019-05-15 NOTE — ED Triage Notes (Signed)
Pt states a tooth broke off on the R upper side of his jaw around 5 months ago, states hes been trying to get it pulled but unable to due to covid shutting down the clinic. Pt states he has pain and swelling in his tooth.

## 2019-05-15 NOTE — Discharge Instructions (Addendum)
Start Augmentin as directed for dental infection. Ibuprofen for pain. Follow up with dentist for further treatment and evaluation. If experiencing swelling of the throat, trouble breathing, trouble swallowing, leaning forward to breath, drooling, go to the emergency department for further evaluation.  ° °

## 2019-05-16 ENCOUNTER — Other Ambulatory Visit: Payer: Self-pay | Admitting: Physician Assistant

## 2019-05-16 DIAGNOSIS — G4700 Insomnia, unspecified: Secondary | ICD-10-CM

## 2019-05-25 ENCOUNTER — Ambulatory Visit
Admission: EM | Admit: 2019-05-25 | Discharge: 2019-05-25 | Disposition: A | Discharge status: Home / Self Care | Payer: BC Managed Care – PPO | Attending: Emergency Medicine | Admitting: Emergency Medicine

## 2019-05-25 ENCOUNTER — Encounter: Payer: Self-pay | Admitting: Emergency Medicine

## 2019-05-25 ENCOUNTER — Encounter: Payer: Self-pay | Admitting: Physical Therapy

## 2019-05-25 ENCOUNTER — Ambulatory Visit (INDEPENDENT_AMBULATORY_CARE_PROVIDER_SITE_OTHER): Payer: BC Managed Care – PPO | Admitting: Physical Therapy

## 2019-05-25 ENCOUNTER — Ambulatory Visit (INDEPENDENT_AMBULATORY_CARE_PROVIDER_SITE_OTHER): Payer: BC Managed Care – PPO

## 2019-05-25 ENCOUNTER — Other Ambulatory Visit: Payer: Self-pay

## 2019-05-25 VITALS — BP 150/102 | HR 102

## 2019-05-25 DIAGNOSIS — M25662 Stiffness of left knee, not elsewhere classified: Secondary | ICD-10-CM

## 2019-05-25 DIAGNOSIS — M79605 Pain in left leg: Secondary | ICD-10-CM

## 2019-05-25 DIAGNOSIS — R269 Unspecified abnormalities of gait and mobility: Secondary | ICD-10-CM

## 2019-05-25 DIAGNOSIS — R2242 Localized swelling, mass and lump, left lower limb: Secondary | ICD-10-CM

## 2019-05-25 DIAGNOSIS — R600 Localized edema: Secondary | ICD-10-CM

## 2019-05-25 DIAGNOSIS — I10 Essential (primary) hypertension: Secondary | ICD-10-CM

## 2019-05-25 DIAGNOSIS — M25562 Pain in left knee: Secondary | ICD-10-CM

## 2019-05-25 DIAGNOSIS — M7989 Other specified soft tissue disorders: Secondary | ICD-10-CM | POA: Diagnosis not present

## 2019-05-25 MED ORDER — MELOXICAM 7.5 MG PO TABS: 7.5000 mg | ORAL_TABLET | Freq: Every day | ORAL | 0 refills | Status: DC

## 2019-05-25 NOTE — ED Provider Notes (Signed)
[facility]    CSN: [id number] Arrival date & time: 05/25/19  0935      History   Chief Complaint Chief Complaint  Patient presents with  . Leg Swelling    HPI Michael [name] is a 58 y.o. male with history of hypertension, arthritis presenting for persistent left leg pain and swelling status post TKR x3.  Patient last seen by his orthopedist 10/12, next appointment 1/7.  States that he was previously evaluated by outside provider for significant left leg pain, swelling 2 weeks ago: DVT Doppler study was negative at that time.  Patient has been working with physical therapy since, has had significant improvement in swelling, pain.  Patient does have focal area of pain despite lack of trauma, inciting event over anterior shin on affected side inferior to "the knee hardware ".  Has taken hydrocodone in the past, not tried anything recently for this.  Patient does wear compression socks for chronic bilateral lower extremity edema, for which she is followed by his PCP.  Past Medical History:  Diagnosis Date  . Arthritis   . Hepatitis    Hep C rx  . Hypertension     Patient Active Problem List   Diagnosis Date Noted  . 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 Status post revision of total knee replacement, 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  . Unilateral primary osteoarthritis, left hip 09/08/2018  . Positive depression screening 05/22/2017  . Hepatitis C virus infection cured after antiviral drug therapy 01/19/2017  . Hyperlipidemia 10/23/2016  . Hypertension 10/18/2016  . Tobacco dependence 10/18/2016  . 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 Right rotator cuff tear 01/29/2013    Past Surgical History:  Procedure Laterality Date  . COLONOSCOPY    . left knee arthroscopy     . left rotator cuff surgery     . REVISION TOTAL KNEE ARTHROPLASTY Left 06/15/2018  . SHOULDER OPEN ROTATOR CUFF REPAIR Right 01/29/2013   Procedure: RIGHT  SHOULDER MINI ROTATOR CUFF REPAIR;  Surgeon: Javier DockerJeffrey C Beane, MD;  Location: [facility];  Service: Orthopedics;  Laterality: Right;  With ANCHORS  . TOTAL KNEE ARTHROPLASTY Left 02/12/2017  . TOTAL KNEE ARTHROPLASTY Left 02/12/2017   Procedure: LEFT TOTAL KNEE ARTHROPLASTY;  Surgeon: Tarry KosXu, Naiping M, MD;  Location: [facility];  Service: Orthopedics;  Laterality: Left;  . TOTAL KNEE REVISION Left 06/15/2018   Procedure: LEFT TOTAL KNEE REVISION;  Surgeon: Tarry KosXu, Naiping M, MD;  Location: [facility];  Service: Orthopedics;  Laterality: Left;  . TOTAL KNEE REVISION Left 03/08/2019   Procedure: LEFT TOTAL KNEE REVISION;  Surgeon: Tarry KosXu, Naiping M, MD;  Location: [facility];  Service: Orthopedics;  Laterality: Left;       Home Medications    Prior to Admission medications   Medication Sig Start Date End Date Taking? Authorizing Provider  amLODipine (NORVASC) 10 MG tablet Take 1 tablet (10 mg total) by mouth daily. 01/26/19   Anders SimmondsMcClung, Angela M, PA-C  aspirin 81 MG chewable tablet Chew 1 tablet (81 mg total) by mouth 2 (two) times daily. 03/09/19   Cristie HemStanbery, Mary L, PA-C  atorvastatin (LIPITOR) 10 MG tablet Take 1 tablet (10 mg total) by mouth daily. 01/26/19   Anders SimmondsMcClung, Angela M, PA-C  ibuprofen (ADVIL) 800 MG tablet Take 1 tablet (800 mg total) by mouth 3 (three) times daily. 05/15/19   Cathie HoopsYu, Amy V, PA-C  lisinopril-hydrochlorothiazide (ZESTORETIC) 20-25 MG tablet Take 1 tablet by mouth daily. 01/26/19  Freeman Caldron M, PA-C  meloxicam (MOBIC) 7.5 MG tablet Take 1 tablet (7.5 mg total) by mouth daily. 05/25/19   Hall-Potvin, [place], PA-C  oxyCODONE (ROXICODONE) 5 MG immediate release tablet Take 1-2 tabs po q 6-8 hours prn pain Patient not taking: Reported on 03/30/2019 03/15/19   Aundra Dubin, PA-C  psyllium (METAMUCIL) 58.6 % powder Take 1 packet by mouth daily.    [provider]  traZODone (DESYREL) 50 MG tablet TAKE 0.5 TABLETS (25 MG TOTAL) BY MOUTH AT BEDTIME AS NEEDED FOR SLEEP. 05/17/19   Ladell Pier, MD    Family History Family History  Problem Relation Age of Onset  . CAD Mother   . Deep vein thrombosis Brother     Social History Social History   Tobacco Use  . Smoking status: Current Every Day Smoker    Packs/day: 1.00    Years: 37.00    Pack years: 37.00    Types: Cigarettes  . Smokeless tobacco: Never Used  Substance Use Topics  . Alcohol use: Not Currently  . Drug use: No    Comment: hx of marijuana and cocaine, LSD     Allergies   Chantix [varenicline]   Review of Systems Review of Systems  Constitutional: Negative for activity change, appetite change, fatigue and fever.  Respiratory: Negative for cough, shortness of breath and wheezing.   Cardiovascular: Positive for leg swelling. Negative for chest pain and palpitations.  Musculoskeletal:       Positive for positive for left shin pain  Neurological: Negative for weakness and numbness.     Physical Exam Triage Vital Signs ED Triage Vitals  Enc Vitals Group     BP      Pulse      Resp      Temp      Temp src      SpO2      Weight      Height      Head Circumference      Peak Flow      Pain Score      Pain Loc      Pain Edu?      Excl. in [place]?    No data found.  Updated Vital Signs BP (!) 150/89 (BP Location: Left Arm)   Pulse 88   Temp 97.7 F (36.5 C) (Temporal)   Resp 18   SpO2 95%   Visual Acuity Right Eye Distance:   Left Eye Distance:   Bilateral Distance:    Right Eye Near:   Left Eye Near:    Bilateral Near:     Physical Exam Constitutional:      General: He is not in acute distress.    Appearance: He is obese. He is not ill-appearing or diaphoretic.  HENT:     Head: Normocephalic and atraumatic.     Mouth/Throat:     Mouth: Mucous membranes are moist.     Pharynx: Oropharynx is clear.  Eyes:     General: No scleral icterus.    Conjunctiva/sclera: Conjunctivae normal.     Pupils: Pupils are equal, round, and reactive to light.  Cardiovascular:     Rate  and Rhythm: Normal rate and regular rhythm.  Pulmonary:     Effort: Pulmonary effort is normal. No respiratory distress.     Breath sounds: No wheezing, rhonchi or rales.  Musculoskeletal:     Right lower leg: Edema present.     Left lower leg: Edema present.  Comments: Left knee with incisional scar that is healed well.  No bony knee tenderness.  Decreased ROM second to swelling which is chronic/stable per patient.  Patient does have focal area of pain without nodule, mass, deformity, overlying rash over proximal aspect of anterior shin.  Skin:    General: Skin is warm.     Coloration: Skin is not jaundiced.     Findings: No bruising, erythema or rash.  Neurological:     General: No focal deficit present.     Mental Status: He is alert.     Sensory: No sensory deficit.     Deep Tendon Reflexes: Reflexes normal.      UC Treatments / Results  Labs (all labs ordered are listed, but only abnormal results are displayed) Labs Reviewed - No data to display  EKG   Radiology DG Tibia/Fibula Left  Result Date: 05/25/2019 CLINICAL DATA:  Left leg pain and swelling. History of prior total knee arthroplasty revision 03/08/2019. EXAM: LEFT TIBIA AND FIBULA - 2 VIEW COMPARISON:  04/27/2019 FINDINGS: Stable appearance of the tibial and femoral components. Long stem tibial prosthesis without obvious complicating features. No periprosthetic fracture. The tibia and fibula are intact. IMPRESSION: Stable appearance of the tibial and femoral components the total knee arthroplasty. No complicating features are identified. Electronically Signed   By: Rudie Meyer M.D.   On: 05/25/2019 10:36    Procedures Procedures (including critical care time)  Medications Ordered in UC Medications - No data to display  Initial Impression / Assessment and Plan / UC Course  I have reviewed the triage vital signs and the nursing notes.  Pertinent labs & imaging results that were available during my care of  the patient were reviewed by me and considered in my medical decision making (see chart for details).     Patient requesting x-ray today, which I am agreeable to given surgical history, focal pain inferior to hardware.  This is reviewed by me and radiology: Stable appearance of tibial and femoral components of TKR.  No periprosthetic fracture, and tibia and fibula are intact.  Reviewed findings with patient verbalized understanding.  Will trial Mobic for pain, have patient follow-up with Ortho.  Return precautions discussed, patient verbalized understanding and is agreeable to plan. Final Clinical Impressions(s) / UC Diagnoses   Final diagnoses:  Leg pain, anterior, left     Discharge Instructions     Important to continue using compression stockings, and follow-up with your orthopedic provider for continued surveillance, management. May try Mobic once daily as needed for pain. Can also elevate pain for additional relief of chronic lower leg swelling. Important follow-up with PCP regarding routine health management. Go to ER for worsening pain, increased swelling (particularly if only on one side), or you develop chest pain, difficulty breathing.    ED Prescriptions    Medication Sig Dispense Auth. Provider   meloxicam (MOBIC) 7.5 MG tablet Take 1 tablet (7.5 mg total) by mouth daily. 14 tablet Hall-Potvin, [place], PA-C     PDMP not reviewed this encounter.   Hall-Potvin, [place], [place] 05/25/19 1047

## 2019-05-25 NOTE — Discharge Instructions (Signed)
Important to continue using compression stockings, and follow-up with your orthopedic provider for continued surveillance, management. May try Mobic once daily as needed for pain. Can also elevate pain for additional relief of chronic lower leg swelling. Important follow-up with PCP regarding routine health management. Go to ER for worsening pain, increased swelling (particularly if only on one side), or you develop chest pain, difficulty breathing.

## 2019-05-25 NOTE — Therapy (Signed)
[facility] [address] [place], [place], [address] Phone: [phone]   Fax:  [phone]  Patient Details  Name: Michael [name] MRN: [id number] Date of Birth: 1960/11/29 Referring Provider:  Leandrew Koyanagi, MD  Encounter Date: 05/25/2019  Vitals:   05/25/19 0850  BP: (!) 150/102  Pulse: (!) 102  SpO2: 97%   Pt arrived to PT session today with c/o increased swelling in LLE, with DVT ruled out ~ 2 weeks ago.  Also reports 15# weight gain in past month and expresses he has not been over-eating.  Vitals noted above.  Spoke with pt's PA-C here and recommended he follow up with PCP at this time.  Discussed with pt and he is in agreement.      Laureen Abrahams, PT, DPT 05/25/19 9:07 AM     [facility] [address] [place], [place], [address] Phone: [phone]   Fax:  [phone]

## 2019-05-25 NOTE — ED Triage Notes (Signed)
Pt presents to [facility] for assessment of left leg swelling after knee replacement surgery on same leg this year.  Patient states the swelling was much worse and he had a DVT study done two weeks ago with negative result.  States he feels he has been gaining a lot of water weight recently, c/o hypertension even with home meds, and when asked, patient does endorse some swelling to the right leg as well.  Patient used to see [facility] for PCP, but unable to see him today.

## 2019-05-25 NOTE — ED Notes (Signed)
Patient able to ambulate independently  

## 2019-05-26 ENCOUNTER — Encounter: Payer: Self-pay | Admitting: Orthopaedic Surgery

## 2019-05-26 ENCOUNTER — Telehealth: Payer: Self-pay | Admitting: Orthopaedic Surgery

## 2019-05-26 ENCOUNTER — Encounter: Payer: BC Managed Care – PPO | Admitting: Physical Therapy

## 2019-05-26 NOTE — Telephone Encounter (Signed)
See message.

## 2019-05-26 NOTE — Telephone Encounter (Signed)
I spoke to PT about this yesterday.  It would be best to see his pcp for the chronic swelling to BLE.  I am afraid the prednisone will make this worse

## 2019-05-26 NOTE — Telephone Encounter (Signed)
Patient called. Says he went to Urgent care yesterday because of his leg being swollen. He would like some Pretisone called in for him. His call back number is [phone]

## 2019-05-26 NOTE — Telephone Encounter (Signed)
[facility] cannot see [facility] per patient. Cannot work him in at all. I advise on the prednisone. He states Dr Erlinda Hong give this to him and swelling goes down.

## 2019-05-27 ENCOUNTER — Other Ambulatory Visit: Payer: Self-pay | Admitting: Physician Assistant

## 2019-05-27 MED ORDER — METHYLPREDNISOLONE 4 MG PO TBPK: ORAL_TABLET | ORAL | 0 refills | Status: DC

## 2019-05-27 NOTE — Telephone Encounter (Signed)
See previous message

## 2019-05-27 NOTE — Telephone Encounter (Signed)
I have sent a mychart message to patient. He had sent [place] a message through [place].

## 2019-05-27 NOTE — Telephone Encounter (Signed)
Sent in rx.  Please have him go ahead and make a future appointment with community health as this is a chronic problem.

## 2019-06-03 ENCOUNTER — Encounter: Payer: Self-pay | Admitting: Orthopaedic Surgery

## 2019-06-03 ENCOUNTER — Ambulatory Visit: Payer: Self-pay

## 2019-06-03 ENCOUNTER — Other Ambulatory Visit: Payer: Self-pay

## 2019-06-03 ENCOUNTER — Ambulatory Visit (INDEPENDENT_AMBULATORY_CARE_PROVIDER_SITE_OTHER): Payer: BC Managed Care – PPO | Admitting: Orthopaedic Surgery

## 2019-06-03 ENCOUNTER — Encounter: Payer: Self-pay | Admitting: Internal Medicine

## 2019-06-03 DIAGNOSIS — Z96652 Presence of left artificial knee joint: Secondary | ICD-10-CM | POA: Diagnosis not present

## 2019-06-03 DIAGNOSIS — T84033A Mechanical loosening of internal left knee prosthetic joint, initial encounter: Secondary | ICD-10-CM

## 2019-06-03 NOTE — Progress Notes (Signed)
Office Visit Note   Patient: Michael [name]           Date of Birth: [birth year]           MRN: [id number] Visit Date: 06/03/2019              Requested by: Ladell Pier, MD [address] [place],  [place] [address] PCP: Ladell Pier, MD   Assessment & Plan: Visit Diagnoses:  1. 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 S/P revision of total knee, left     Plan: Michael [name] is 12 weeks status post left total knee revision.  The implant looks good.  His knee is functioning well.  I believe the pain that he is experiencing is due to his pitting edema.  I have recommended that he follow-up with his primary care doctor for this issue.  I would like to see him back in 9 months with 2 view x-rays of the left knee for his 1 year follow-up.  Follow-Up Instructions: Return in about 9 months (around 03/02/2020).   Orders:  Orders Placed This Encounter  Procedures  . XR KNEE 3 VIEW LEFT   No orders of the defined types were placed in this encounter.     Procedures: No procedures performed   Clinical Data: No additional findings.   Subjective: Chief Complaint  Patient presents with  . Left Knee - Pain    Michael [name] is 12 weeks status post total knee revision.  Overall he has done well.  He is having trouble standing 12 hours a day straight up.  His main complaint is that he is having pain in the lower half of his lower leg is worse with standing and walking.  He does have history of pitting edema.  He wears compression socks.  He recently was ruled out for DVT.  He also had x-rays of his tib-fib that were unremarkable.   Review of Systems   Objective: Vital Signs: There were no vitals taken for this visit.  Physical Exam  Ortho Exam Left lower extremity exam shows a fully healed surgical scar.  Excellent range of motion.  No evidence of infection.  He has subcutaneous tenderness in the lower half of his leg.  There is a  slightly tender to the bone as well.   Specialty Comments:  No specialty comments available.  Imaging: XR KNEE 3 VIEW LEFT  Result Date: 06/03/2019 Stable total knee replacement without complication.  No evidence of cortical reaction at the distal tip of the tibial stem.    PMFS History: Patient Active Problem List   Diagnosis Date Noted  . 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 Status post revision of total knee replacement, 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  . Unilateral primary osteoarthritis, left hip 09/08/2018  . Positive depression screening 05/22/2017  . Hepatitis C virus infection cured after antiviral drug therapy 01/19/2017  . Hyperlipidemia 10/23/2016  . Hypertension 10/18/2016  . Tobacco dependence 10/18/2016  . Substance abuse in remission ([place]) 10/18/2016  . Right rotator cuff tear 01/29/2013   Past Medical History:  Diagnosis Date  . Arthritis   . Hepatitis    Hep C rx  . Hypertension     Family History  Problem Relation Age of Onset  . CAD Mother   . Deep vein thrombosis Brother     Past Surgical History:  Procedure Laterality Date  . COLONOSCOPY    .  left knee arthroscopy     . left rotator cuff surgery     . REVISION TOTAL KNEE ARTHROPLASTY Left 06/15/2018  . SHOULDER OPEN ROTATOR CUFF REPAIR Right 01/29/2013   Procedure: RIGHT SHOULDER MINI ROTATOR CUFF REPAIR;  Surgeon: Javier Docker, MD;  [facility]: [facility];  Service: Orthopedics;  Laterality: Right;  With ANCHORS  . TOTAL KNEE ARTHROPLASTY Left 02/12/2017  . TOTAL KNEE ARTHROPLASTY Left 02/12/2017   Procedure: LEFT TOTAL KNEE ARTHROPLASTY;  Surgeon: Tarry Kos, MD;  [facility];  Service: Orthopedics;  Laterality: Left;  . TOTAL KNEE REVISION Left 06/15/2018   Procedure: LEFT TOTAL KNEE REVISION;  Surgeon: Tarry Kos, MD;  [facility];  Service: Orthopedics;  Laterality: Left;  . TOTAL KNEE REVISION Left 03/08/2019   Procedure: LEFT TOTAL KNEE REVISION;  Surgeon: Tarry Kos, MD;  [facility];  Service: Orthopedics;  Laterality: Left;   Social History   Occupational History  . Occupation: Chartered certified accountant  Tobacco Use  . Smoking status: Current Every Day Smoker    Packs/day: 1.00    Years: 37.00    Pack years: 37.00    Types: Cigarettes  . Smokeless tobacco: Never Used  Substance and Sexual Activity  . Alcohol use: Not Currently  . Drug use: No    Comment: hx of marijuana and cocaine, LSD  . Sexual activity: Yes    Partners: Female    Birth control/protection: None

## 2019-06-07 ENCOUNTER — Encounter: Payer: Self-pay | Admitting: Orthopaedic Surgery

## 2019-06-07 NOTE — Telephone Encounter (Signed)
Sure we can write a letter for that.  Thanks.

## 2019-06-08 ENCOUNTER — Ambulatory Visit: Payer: BC Managed Care – PPO | Admitting: Orthopaedic Surgery

## 2019-06-09 ENCOUNTER — Encounter: Payer: Self-pay | Admitting: Orthopaedic Surgery

## 2019-06-09 ENCOUNTER — Ambulatory Visit: Payer: BC Managed Care – PPO

## 2019-06-10 ENCOUNTER — Other Ambulatory Visit: Payer: Self-pay

## 2019-06-10 ENCOUNTER — Encounter: Payer: Self-pay | Admitting: Orthopaedic Surgery

## 2019-06-10 ENCOUNTER — Encounter: Payer: Self-pay | Admitting: Internal Medicine

## 2019-06-10 ENCOUNTER — Ambulatory Visit: Payer: BC Managed Care – PPO | Attending: Internal Medicine | Admitting: Internal Medicine

## 2019-06-10 VITALS — BP 160/84 | HR 82 | Resp 16 | Wt 266.4 lb

## 2019-06-10 DIAGNOSIS — R600 Localized edema: Secondary | ICD-10-CM

## 2019-06-10 DIAGNOSIS — M79605 Pain in left leg: Secondary | ICD-10-CM

## 2019-06-10 DIAGNOSIS — M25572 Pain in left ankle and joints of left foot: Secondary | ICD-10-CM | POA: Diagnosis not present

## 2019-06-10 DIAGNOSIS — I10 Essential (primary) hypertension: Secondary | ICD-10-CM

## 2019-06-10 MED ORDER — CARVEDILOL 6.25 MG PO TABS: 6.2500 mg | ORAL_TABLET | Freq: Two times a day (BID) | ORAL | 3 refills | Status: DC

## 2019-06-10 NOTE — Telephone Encounter (Signed)
He may not stand more than 1 hr continuously, lift more than 50 lbs, cannot climb more than 1 flight of stairs or ladder.

## 2019-06-10 NOTE — Progress Notes (Signed)
Patient ID: Michael [name], male    DOB: [birth year]  MRN: [id number]  CC: Leg Swelling   Subjective: Michael [name] is a 59 y.o. male who presents for UC visit for leg swelling His concerns today include:  Pt with hx ofHTN,HL, OA LT hip and lumbar spine,Hep C treated with interferon/rib combo in [place] 2005, tobacco abuse, substance use disorderwas on Methadone in past   pt had revision of LT TKR 02/2019.  We had a telephone visit 05/07/2019 when he complained of pain and swelling in the left calf.  Doppler ultrasound was ordered and was negative for DVT. Pt c/o swelling in lower LT leg since 04/2019. Given a Prednisone taper 05/25/2019 but Dr. Roda Shutters PA.  This decreased the swelling significantly but swelling has returned. Swelling is from ankle all the way up.  He is also complaining of pain in the lower shin whenever he walks.  He states that these problems all started after his last surgery in October.  Seen in urgent care at the end of December and had x-rays of the tibia and fibula.  This revealed stable appearance of the tibial and femoral components of the total knee arthroplasty.  No complicating features were identified per the x-ray report.  He was told that the pain in the lower shin is likely soft tissue issue/inflammation.  He was prescribed meloxicam but he did not fill the prescription because he has some at home.  Patient states that Dr. Roda Shutters advised him to be seen by his PCP to see if something can be given to help decrease his swelling.  Patient is mentally stressed.  He has loss his job because of ongoing issues with his knee/leg since the beginning of last year.  His job involves a lot of standing which he is not able to do anymore.  He also has not been able to walk as much as he would like and has gained some weight.  He states that he has the orthopedics about whether he should file for disability as he would have a difficult time finding a sitdown job at the age of 67.   Reports that the orthopedics told him that he should apply for disability.  However he has tried contacting his office to help with getting started down that route and has not received any response.  Patient is frustrated.  He denies any chest pain or shortness of breath.    Patient Active Problem List   Diagnosis Date Noted  . 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 Status post revision of total knee replacement, 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  . Unilateral primary osteoarthritis, left hip 09/08/2018  . Positive depression screening 05/22/2017  . Hepatitis C virus infection cured after antiviral drug therapy 01/19/2017  . Hyperlipidemia 10/23/2016  . Hypertension 10/18/2016  . Tobacco dependence 10/18/2016  . 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 Right rotator cuff tear 01/29/2013     Current Outpatient Medications on File Prior to Visit  Medication Sig Dispense Refill  . amLODipine (NORVASC) 10 MG tablet Take 1 tablet (10 mg total) by mouth daily. 30 tablet 6  . aspirin 81 MG chewable tablet Chew 1 tablet (81 mg total) by mouth 2 (two) times daily. 84 tablet 0  . atorvastatin (LIPITOR) 10 MG tablet Take 1 tablet (10 mg total) by mouth daily. 30 tablet 6  . ibuprofen (ADVIL) 800 MG tablet Take 1 tablet (800 mg total) by mouth 3 (three) times  daily. 21 tablet 0  . lisinopril-hydrochlorothiazide (ZESTORETIC) 20-25 MG tablet Take 1 tablet by mouth daily. 30 tablet 6  . meloxicam (MOBIC) 7.5 MG tablet Take 1 tablet (7.5 mg total) by mouth daily. 14 tablet 0  . methylPREDNISolone (MEDROL DOSEPAK) 4 MG TBPK tablet Take as directed 21 tablet 0  . oxyCODONE (ROXICODONE) 5 MG immediate release tablet Take 1-2 tabs po q 6-8 hours prn pain (Patient not taking: Reported on 03/30/2019) 30 tablet 0  . psyllium (METAMUCIL) 58.6 % powder Take 1 packet by mouth daily.    . traZODone (DESYREL) 50 MG tablet TAKE 0.5 TABLETS (25 MG TOTAL) BY MOUTH AT BEDTIME AS NEEDED FOR  SLEEP. 45 tablet 0   No current facility-administered medications on file prior to visit.    Allergies  Allergen Reactions  . Chantix [Varenicline] Itching and Rash    Social History   Socioeconomic History  . Marital status: Single    Spouse name: Not on file  . Number of children: 0  . Years of education: 82  . Highest education level: Not on file  Occupational History  . Occupation: Chartered certified accountant  Tobacco Use  . Smoking status: Current Every Day Smoker    Packs/day: 1.00    Years: 37.00    Pack years: 37.00    Types: Cigarettes  . Smokeless tobacco: Never Used  Substance and Sexual Activity  . Alcohol use: Not Currently  . Drug use: No    Comment: hx of marijuana and cocaine, LSD  . Sexual activity: Yes    Partners: Female    Birth control/protection: None  Other Topics Concern  . Not on file  Social History Narrative  . Not on file   Social Determinants of Health   Financial Resource Strain:   . Difficulty of Paying Living Expenses: Not on file  Food Insecurity:   . Worried About Programme researcher, broadcasting/film/video in the Last Year: Not on file  . Ran Out of Food in the Last Year: Not on file  Transportation Needs:   . Lack of Transportation (Medical): Not on file  . Lack of Transportation (Non-Medical): Not on file  Physical Activity:   . Days of Exercise per Week: Not on file  . Minutes of Exercise per Session: Not on file  Stress:   . Feeling of Stress : Not on file  Social Connections:   . Frequency of Communication with Friends and Family: Not on file  . Frequency of Social Gatherings with Friends and Family: Not on file  . Attends Religious Services: Not on file  . Active Member of Clubs or Organizations: Not on file  . Attends Banker Meetings: Not on file  . Marital Status: Not on file  Intimate Partner Violence:   . Fear of Current or Ex-Partner: Not on file  . Emotionally Abused: Not on file  . Physically Abused: Not on file  . Sexually Abused:  Not on file    Family History  Problem Relation Age of Onset  . CAD Mother   . Deep vein thrombosis Brother     Past Surgical History:  Procedure Laterality Date  . COLONOSCOPY    . left knee arthroscopy     . left rotator cuff surgery     . REVISION TOTAL KNEE ARTHROPLASTY Left 06/15/2018  . SHOULDER OPEN ROTATOR CUFF REPAIR Right 01/29/2013   Procedure: RIGHT SHOULDER MINI ROTATOR CUFF REPAIR;  Surgeon: Javier Docker, MD;  [facility];  Service:  Orthopedics;  Laterality: Right;  With ANCHORS  . TOTAL KNEE ARTHROPLASTY Left 02/12/2017  . TOTAL KNEE ARTHROPLASTY Left 02/12/2017   Procedure: LEFT TOTAL KNEE ARTHROPLASTY;  Surgeon: Leandrew Koyanagi, MD;  Location: [facility];  Service: Orthopedics;  Laterality: Left;  . TOTAL KNEE REVISION Left 06/15/2018   Procedure: LEFT TOTAL KNEE REVISION;  Surgeon: Leandrew Koyanagi, MD;  Location: [facility];  Service: Orthopedics;  Laterality: Left;  . TOTAL KNEE REVISION Left 03/08/2019   Procedure: LEFT TOTAL KNEE REVISION;  Surgeon: Leandrew Koyanagi, MD;  Location: [facility];  Service: Orthopedics;  Laterality: Left;    ROS: Review of Systems Negative except as stated above  PHYSICAL EXAM: BP (!) 145/87   Pulse 82   Resp 16   Wt 266 lb 6.4 oz (120.8 kg)   SpO2 98%   BMI 37.16 kg/m   Wt Readings from Last 3 Encounters:  06/10/19 266 lb 6.4 oz (120.8 kg)  03/02/19 254 lb (115.2 kg)  06/15/18 254 lb (115.2 kg)    Physical Exam General appearance - alert, well appearing, middle-age Caucasian male and in no distress Mental status -patient seems very stressed  chest - clear to auscultation, no wheezes, rales or rhonchi, symmetric air entry Heart - normal rate, regular rhythm, normal S1, S2, no murmurs, rubs, clicks or gallops MSK: Patient has enlargement and swelling of the left knee joint compared to the right knee.  Surgical incision over the left knee is well-healed.  He has large lower legs.  There is trace pitting edema of both lower extremities.   There is mild to moderate tenderness on palpation along with the shin of the left leg.  However examination of both lower legs reveal no significant difference in edema which I mentioned was trace.  No point tenderness on palpation of the left ankle.  He has good range of motion of the left ankle.  No edema or erythema noted of the lower leg or ankle.  CMP Latest Ref Rng & Units 03/09/2019 03/02/2019 06/16/2018  Glucose 70 - 99 mg/dL 112(H) 89 113(H)  BUN 6 - 20 mg/dL 12 13 15   Creatinine 0.61 - 1.24 mg/dL 0.88 0.89 1.01  Sodium 135 - 145 mmol/L 137 137 136  Potassium 3.5 - 5.1 mmol/L 4.3 4.2 3.9  Chloride 98 - 111 mmol/L 101 99 102  CO2 22 - 32 mmol/L 24 28 26   Calcium 8.9 - 10.3 mg/dL 9.2 9.4 8.6(L)  Total Protein 6.5 - 8.1 g/dL - 6.9 -  Total Bilirubin 0.3 - 1.2 mg/dL - 0.9 -  Alkaline Phos 38 - 126 U/L - 80 -  AST 15 - 41 U/L - 24 -  ALT 0 - 44 U/L - 28 -   Lipid Panel     Component Value Date/Time   CHOL 175 03/02/2018 0934   TRIG 116 03/02/2018 0934   HDL 54 03/02/2018 0934   CHOLHDL 3.2 03/02/2018 0934   LDLCALC 98 03/02/2018 0934    CBC    Component Value Date/Time   WBC 13.6 (H) 03/09/2019 0235   RBC 4.41 03/09/2019 0235   HGB 13.5 03/09/2019 0235   HGB 14.3 05/12/2018 0853   HCT 39.1 03/09/2019 0235   HCT 43.4 05/12/2018 0853   PLT 260 03/09/2019 0235   PLT 282 03/02/2018 0934   MCV 88.7 03/09/2019 0235   MCV 90 05/12/2018 0853   MCH 30.6 03/09/2019 0235   MCHC 34.5 03/09/2019 0235   RDW 12.8 03/09/2019 0235  RDW 13.1 05/12/2018 0853   LYMPHSABS 1.7 03/02/2019 0927   LYMPHSABS 1.4 05/12/2018 0853   MONOABS 0.7 03/02/2019 0927   EOSABS 0.2 03/02/2019 0927   EOSABS 0.2 05/12/2018 0853   BASOSABS 0.1 03/02/2019 0927   BASOSABS 0.0 05/12/2018 0853    ASSESSMENT AND PLAN: 1. Edema of both legs -We will have him discontinue amlodipine to make sure that this is not playing a role in the swelling of his legs.  We will change to carvedilol instead.  Low-salt  diet encouraged. -Encouraged to continue using compression socks -check BNP 2. Pain of left lower extremity See #1 above.  3. Acute left ankle pain We will get an x-ray of the left ankle.  He has already had an x-ray of the tibia and fibula.  I think the pain that he is having over the shin all the way down is most likely inflammation.  Doubt shinsplints as he has not been doing a lot of walking  4. Essential hypertension Stop amlodipine - carvedilol (COREG) 6.25 MG tablet; Take 1 tablet (6.25 mg total) by mouth 2 (two) times daily with a meal.  Dispense: 60 tablet; Refill: 3   Patient was given the opportunity to ask questions.  Patient verbalized understanding of the plan and was able to repeat key elements of the plan.   No orders of the defined types were placed in this encounter.    Requested Prescriptions    No prescriptions requested or ordered in this encounter    No follow-ups on file.  Jonah Blue, MD, FACP

## 2019-06-11 ENCOUNTER — Encounter: Payer: Self-pay | Admitting: Orthopaedic Surgery

## 2019-06-13 ENCOUNTER — Encounter: Payer: Self-pay | Admitting: Orthopaedic Surgery

## 2019-06-14 ENCOUNTER — Ambulatory Visit (INDEPENDENT_AMBULATORY_CARE_PROVIDER_SITE_OTHER): Payer: BC Managed Care – PPO | Admitting: Physical Therapy

## 2019-06-14 ENCOUNTER — Encounter: Payer: Self-pay | Admitting: Orthopaedic Surgery

## 2019-06-14 ENCOUNTER — Encounter: Payer: Self-pay | Admitting: Physical Therapy

## 2019-06-14 ENCOUNTER — Other Ambulatory Visit: Payer: Self-pay

## 2019-06-14 VITALS — BP 149/95 | HR 70

## 2019-06-14 DIAGNOSIS — R600 Localized edema: Secondary | ICD-10-CM

## 2019-06-14 DIAGNOSIS — M25662 Stiffness of left knee, not elsewhere classified: Secondary | ICD-10-CM | POA: Diagnosis not present

## 2019-06-14 DIAGNOSIS — R269 Unspecified abnormalities of gait and mobility: Secondary | ICD-10-CM | POA: Diagnosis not present

## 2019-06-14 DIAGNOSIS — M25562 Pain in left knee: Secondary | ICD-10-CM | POA: Diagnosis not present

## 2019-06-14 NOTE — Therapy (Signed)
[facility] Physical Therapy [address] [place], [place], [address] Phone: [phone]   Fax:  [phone]  Physical Therapy Treatment/Discharge Summary  Patient Details  Name: Michael [name] MRN: [id number] Date of Birth: 02-13-1961 Referring Provider (PT): Aundra Dubin, [place]   Encounter Date: 06/14/2019  PT End of Session - 06/14/19 1013    Visit Number  5    PT Start Time  0932    PT Stop Time  1003    PT Time Calculation (min)  31 min    Activity Tolerance  Patient tolerated treatment well    Behavior During Therapy  [facility] for tasks assessed/performed       Past Medical History:  Diagnosis Date  . Arthritis   . Hepatitis    Hep C rx  . Hypertension     Past Surgical History:  Procedure Laterality Date  . COLONOSCOPY    . left knee arthroscopy     . left rotator cuff surgery     . REVISION TOTAL KNEE ARTHROPLASTY Left 06/15/2018  . SHOULDER OPEN ROTATOR CUFF REPAIR Right 01/29/2013   Procedure: RIGHT SHOULDER MINI ROTATOR CUFF REPAIR;  Surgeon: Johnn Hai, MD;  [facility];  Service: Orthopedics;  Laterality: Right;  With ANCHORS  . TOTAL KNEE ARTHROPLASTY Left 02/12/2017  . TOTAL KNEE ARTHROPLASTY Left 02/12/2017   Procedure: LEFT TOTAL KNEE ARTHROPLASTY;  Surgeon: Leandrew Koyanagi, MD;  Location: [facility];  Service: Orthopedics;  Laterality: Left;  . TOTAL KNEE REVISION Left 06/15/2018   Procedure: LEFT TOTAL KNEE REVISION;  Surgeon: Leandrew Koyanagi, MD;  Location: [facility];  Service: Orthopedics;  Laterality: Left;  . TOTAL KNEE REVISION Left 03/08/2019   Procedure: LEFT TOTAL KNEE REVISION;  Surgeon: Leandrew Koyanagi, MD;  Location: [facility];  Service: Orthopedics;  Laterality: Left;    Vitals:   06/14/19 0930 06/14/19 0949 06/14/19 0959  BP: (!) 147/93 (!) 152/102 (!) 149/95  Pulse: 70      Subjective Assessment - 06/14/19 0930    Subjective  swelling has improved, still having a lot of pain in ankle.  saw PCP - she changed his BP meds to  see if that helps with swelling.  has xray scheduled on ankle when he leaves here.    Patient Stated Goals  improve strength    Currently in Pain?  Yes    Pain Score  8    with walking   Pain Location  Leg    Pain Orientation  Lower;Left    Pain Descriptors / Indicators  Sharp;Sore    Pain Type  Acute pain    Pain Onset  1 to 4 weeks ago    Pain Frequency  Constant    Aggravating Factors   weight bearing    Pain Relieving Factors  ice, meds         [facility] PT Assessment - 06/14/19 0943      Assessment   Medical Diagnosis  S/P revision of total knee, left    Referring Provider (PT)  Aundra Dubin, PA-C    Onset Date/Surgical Date  03/08/19    Hand Dominance  Right      AROM   Left Knee Extension  -6   0 after stretching   Left Knee Flexion  123      PROM   Left Knee Extension  0                   [facility] Adult  PT Treatment/Exercise - 06/14/19 0942      Self-Care   Self-Care  Other Self-Care Comments    Other Self-Care Comments   discussed current progress and POC; pt without PT needs at this time - still undergoing testing for LLE pain.  Recommended daily BP checks before and after medication to assess response to BP and notify MD if values continue to be elevated.  Pt verbalized understanding.      Knee/Hip Exercises: Stretches   Passive Hamstring Stretch  Left;3 reps;30 seconds    Passive Hamstring Stretch Limitations  with overpresure at distal thigh for increased extension      Knee/Hip Exercises: Supine   Heel Slides  AAROM;Left;10 reps    Heel Prop for Knee Extension  3 minutes;Weight    Heel Prop for Knee Extension Weight (lbs)  5    Straight Leg Raises  Left;10 reps    Straight Leg Raises Limitations  no quad lag             PT Education - 06/14/19 1013    Education Details  see self care    Person(s) Educated  Patient    Methods  Explanation;Demonstration;Handout    Comprehension  Verbalized understanding;Returned demonstration           PT Long Term Goals - 06/14/19 1013      PT LONG TERM GOAL #1   Title  He will be independent with all HEP issued    Status  Achieved      PT LONG TERM GOAL #2   Title  He will be able to walk up and down stairs with one rail step over step    Baseline  1/18: able to perform - limited by new pain in lower leg    Status  Achieved      PT LONG TERM GOAL #3   Title  improve Lt knee AROM 0-115 for improved function    Status  Achieved      PT LONG TERM GOAL #4   Title  demonstrate improved strength by performing Lt SLR x10 reps without quad lag    Status  Achieved      PT LONG TERM GOAL #5   Title  report pain < 3/10 with activity for improved function    Baseline  1/18: met with knee; new lower limb pain up to 8/10 (currently undergoing testing to determine cause of pain)    Status  Partially Met            Plan - 06/14/19 1121    Clinical Impression Statement  Pt has met all PT goals and is ready for d/c from PT.  He still has some pain near the Lt ankle and residual swelling which he has seen providers and is getting continued testing to determine cause.  At this time he's ready for d/c from PT, recommended continued exercise and follow up with providers about new pain.    Personal Factors and Comorbidities  Past/Current Experience;Comorbidity 3+    Comorbidities  TKA with revision x 2; HTN, Hep C, arthritis    Examination-Activity Limitations  Bathing;Locomotion Level;Transfers;Stairs;Stand    Examination-Participation Restrictions  Driving   occupation   Stability/Clinical Decision Making  Evolving/Moderate complexity    Rehab Potential  Good    PT Frequency  2x / week    PT Duration  6 weeks    PT Treatment/Interventions  ADLs/Self Care Home Management;Cryotherapy;Electrical Stimulation;Ultrasound;Moist Heat;Iontophoresis 69m/ml Dexamethasone;Gait training;Stair training;Functional mobility training;Therapeutic activities;Therapeutic exercise;Balance  training;Patient/family education;Neuromuscular re-education;Manual techniques;Passive range of motion;Vasopneumatic Device;Taping    PT Next Visit Plan  d/c PT today    PT Home Exercise Plan  Access Code: [email or web address]    Consulted and Agree with Plan of Care  Patient       Patient will benefit from skilled therapeutic intervention in order to improve the following deficits and impairments:  Abnormal gait, Decreased range of motion, Increased fascial restricitons, Difficulty walking, Increased muscle spasms, Decreased balance, Decreased strength, Decreased mobility, Impaired flexibility, Pain, Increased edema  Visit Diagnosis: Acute pain of left knee  Stiffness of left knee  Localized edema  Abnormality of gait     Problem List Patient Active Problem List   Diagnosis Date Noted  . 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 Status post revision of total knee replacement, 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  . Unilateral primary osteoarthritis, left hip 09/08/2018  . Positive depression screening 05/22/2017  . Hepatitis C virus infection cured after antiviral drug therapy 01/19/2017  . Hyperlipidemia 10/23/2016  . Hypertension 10/18/2016  . Tobacco dependence 10/18/2016  . Substance abuse in remission ([place]) 10/18/2016  . Right rotator cuff tear 01/29/2013      Laureen Abrahams, PT, DPT 06/14/19 11:25 AM    [facility] Physical Therapy [address] [place], [place], [address] Phone: [phone]   Fax:  [phone]  Name: Michael [name] MRN: [id number] Date of Birth: Jun 04, 1960      PHYSICAL THERAPY DISCHARGE SUMMARY  Visits from Start of Care: 5  Current functional level related to goals / functional outcomes: See above   Remaining deficits: See above   Education / Equipment: HEP  Plan: Patient agrees to discharge.  Patient goals were met. Patient is being discharged due to meeting the stated rehab goals.  ?????      Laureen Abrahams, PT, DPT 06/14/19 11:25 AM [facility] Physical Therapy [address] [place], [place], [address] Phone: [phone]   Fax:  [phone]

## 2019-06-15 ENCOUNTER — Encounter: Payer: Self-pay | Admitting: Orthopaedic Surgery

## 2019-06-15 ENCOUNTER — Ambulatory Visit (HOSPITAL_COMMUNITY)
Admission: RE | Admit: 2019-06-15 | Discharge: 2019-06-15 | Disposition: A | Discharge status: Home / Self Care | Payer: BC Managed Care – PPO | Source: Ambulatory Visit | Attending: Internal Medicine | Admitting: Internal Medicine

## 2019-06-15 DIAGNOSIS — M25572 Pain in left ankle and joints of left foot: Secondary | ICD-10-CM | POA: Insufficient documentation

## 2019-06-15 DIAGNOSIS — M19072 Primary osteoarthritis, left ankle and foot: Secondary | ICD-10-CM | POA: Diagnosis not present

## 2019-06-17 ENCOUNTER — Encounter: Payer: Self-pay | Admitting: Internal Medicine

## 2019-06-17 ENCOUNTER — Ambulatory Visit (HOSPITAL_BASED_OUTPATIENT_CLINIC_OR_DEPARTMENT_OTHER): Payer: BC Managed Care – PPO | Admitting: Family

## 2019-06-17 ENCOUNTER — Encounter: Payer: Self-pay | Admitting: Family

## 2019-06-17 ENCOUNTER — Other Ambulatory Visit: Payer: Self-pay

## 2019-06-17 ENCOUNTER — Ambulatory Visit: Payer: BC Managed Care – PPO | Attending: Internal Medicine

## 2019-06-17 VITALS — BP 151/83 | HR 62 | Temp 97.9°F | Resp 16 | Wt 256.4 lb

## 2019-06-17 DIAGNOSIS — R82998 Other abnormal findings in urine: Secondary | ICD-10-CM | POA: Diagnosis not present

## 2019-06-17 DIAGNOSIS — R739 Hyperglycemia, unspecified: Secondary | ICD-10-CM | POA: Diagnosis not present

## 2019-06-17 DIAGNOSIS — I10 Essential (primary) hypertension: Secondary | ICD-10-CM | POA: Diagnosis not present

## 2019-06-17 DIAGNOSIS — R600 Localized edema: Secondary | ICD-10-CM | POA: Diagnosis not present

## 2019-06-17 DIAGNOSIS — E785 Hyperlipidemia, unspecified: Secondary | ICD-10-CM | POA: Diagnosis not present

## 2019-06-17 DIAGNOSIS — M79662 Pain in left lower leg: Secondary | ICD-10-CM

## 2019-06-17 MED ORDER — AMLODIPINE BESYLATE 10 MG PO TABS: 10.0000 mg | ORAL_TABLET | Freq: Every day | ORAL | 0 refills | Status: DC

## 2019-06-17 NOTE — Progress Notes (Signed)
Pt states he doesn't;t feel good being on the medicine  Pt states he doesn't have any energy   Pt states he has been having slight headaches

## 2019-06-17 NOTE — Telephone Encounter (Signed)
Good morning Doctor,  Please advice if you would like me to schedule an tele visit appt or in person! Thank you!

## 2019-06-17 NOTE — Progress Notes (Signed)
Patient ID: Michael [name], male    DOB: [birth year]  MRN: [id number]  CC: Hypertension  Subjective: Michael [name] is a 59 y.o. male who presents for concerns of hypertension management.   1. HYPERTENSION:  Currently taking: see medication list Med Adherence: [x]  Yes    []  No Medication side effects: [x]  Yes    []  No Adherence with salt restriction: [x]  Yes    []  No Home Monitoring?: [x]  Yes    []  No Monitoring Frequency: [x]  Yes    []  No Home BP results range: [x]  Yes    []  No SOB? [x]  Yes    []  No Chest Pain?: []  Yes    [x]  No Leg swelling?: []  Yes    [x]  No Headaches?: []  Yes    [x]  No Dizziness? []  Yes    [x]  No Comments: Has been taking Carvedilol for everyday for 1 week without missing any doses. Was taking Amlodipine prior to taking Carvedilol. States since taking Carvedilol that he doesn't feel good (cannot specify what doesn't feel good) and has headaches. Requesting to return to Amlodipine because he felt better on that medication. Reports that blood pressure was still high on Amlodipine but sometimes he had normal readings. Overall Amlodipine makes him feel better than Carvedilol. States that if left lower extremity swelling returns as a result of resuming Amlodipine he is fine with that because he does not want to take Carvedilol any longer. Admits shortness of breath since beginning Carvedilol. Recent home monitoring of blood pressure readings are 157/95, 165/92, 130/87, and 163/92.   2. LEFT LEG PAIN:  Reports last surgery of left knee was October 2020. Range of motion is not limited. Reports has 25% use of left leg, can not stand or walk on left leg longer than 1 hour. Admits cannot put pressure on left shin reporting that it is painful to touch. Reports that left ankle is not causing pain or discomfort. Reports that the issue of concern is not his left ankle but rather his left shin. Denies swelling of left lower leg since beginning Carvedilol. Reports left lower leg  swelling has improved since beginning Carvedilol. Reports wearing bilateral compression socks daily. Takes Methadone 60 mg daily for pain management. Also has Oxycontin and Hydrocodone as well prescribed by orthopedic specialist around November 2020. States he was prescribed 20 pills and never used all of them and that he has some left at home if he needs to use them. Concerned about losing job as he is a and required to be on his feet all day. Reports has applied for disability and doesn't have any FMLA left.   Patient Active Problem List   Diagnosis Date Noted  . 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 Status post revision of total knee replacement, 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  . Unilateral primary osteoarthritis, left hip 09/08/2018  . Positive depression screening 05/22/2017  . Hepatitis C virus infection cured after antiviral drug therapy 01/19/2017  . Hyperlipidemia 10/23/2016  . Hypertension 10/18/2016  . Tobacco dependence 10/18/2016  . 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 Right rotator cuff tear 01/29/2013     Current Outpatient Medications on File Prior to Visit  Medication Sig Dispense Refill  . aspirin 81 MG chewable tablet Chew 1 tablet (81 mg total) by mouth 2 (two) times daily. 84 tablet 0  . atorvastatin (LIPITOR) 10 MG tablet Take 1 tablet (10 mg total) by mouth daily. 30 tablet 6  .  carvedilol (COREG) 6.25 MG tablet Take 1 tablet (6.25 mg total) by mouth 2 (two) times daily with a meal. 60 tablet 3  . ibuprofen (ADVIL) 800 MG tablet Take 1 tablet (800 mg total) by mouth 3 (three) times daily. 21 tablet 0  . lisinopril-hydrochlorothiazide (ZESTORETIC) 20-25 MG tablet Take 1 tablet by mouth daily. 30 tablet 6  . methylPREDNISolone (MEDROL DOSEPAK) 4 MG TBPK tablet Take as directed 21 tablet 0  . oxyCODONE (ROXICODONE) 5 MG immediate release tablet Take 1-2 tabs po q 6-8 hours prn pain (Patient not taking: Reported on  03/30/2019) 30 tablet 0  . psyllium (METAMUCIL) 58.6 % powder Take 1 packet by mouth daily.    . traZODone (DESYREL) 50 MG tablet TAKE 0.5 TABLETS (25 MG TOTAL) BY MOUTH AT BEDTIME AS NEEDED FOR SLEEP. 45 tablet 0   No current facility-administered medications on file prior to visit.    Allergies  Allergen Reactions  . Chantix [Varenicline] Itching and Rash    Social History   Socioeconomic History  . Marital status: Single    Spouse name: Not on file  . Number of children: 0  . Years of education: 39  . Highest education level: Not on file  Occupational History  . Occupation: Chartered certified accountant  Tobacco Use  . Smoking status: Current Every Day Smoker    Packs/day: 1.00    Years: 37.00    Pack years: 37.00    Types: Cigarettes  . Smokeless tobacco: Never Used  Substance and Sexual Activity  . Alcohol use: Not Currently  . Drug use: No    Comment: hx of marijuana and cocaine, LSD  . Sexual activity: Yes    Partners: Female    Birth control/protection: None  Other Topics Concern  . Not on file  Social History Narrative  . Not on file   Social Determinants of Health   Financial Resource Strain:   . Difficulty of Paying Living Expenses: Not on file  Food Insecurity:   . Worried About Programme researcher, broadcasting/film/video in the Last Year: Not on file  . Ran Out of Food in the Last Year: Not on file  Transportation Needs:   . Lack of Transportation (Medical): Not on file  . Lack of Transportation (Non-Medical): Not on file  Physical Activity:   . Days of Exercise per Week: Not on file  . Minutes of Exercise per Session: Not on file  Stress:   . Feeling of Stress : Not on file  Social Connections:   . Frequency of Communication with Friends and Family: Not on file  . Frequency of Social Gatherings with Friends and Family: Not on file  . Attends Religious Services: Not on file  . Active Member of Clubs or Organizations: Not on file  . Attends Banker Meetings: Not on file  .  Marital Status: Not on file  Intimate Partner Violence:   . Fear of Current or Ex-Partner: Not on file  . Emotionally Abused: Not on file  . Physically Abused: Not on file  . Sexually Abused: Not on file    Family History  Problem Relation Age of Onset  . CAD Mother   . Deep vein thrombosis Brother     Past Surgical History:  Procedure Laterality Date  . COLONOSCOPY    . left knee arthroscopy     . left rotator cuff surgery     . REVISION TOTAL KNEE ARTHROPLASTY Left 06/15/2018  . SHOULDER OPEN ROTATOR CUFF REPAIR Right  01/29/2013   Procedure: RIGHT SHOULDER MINI ROTATOR CUFF REPAIR;  Surgeon: Johnn Hai, MD;  Location: [facility];  Service: Orthopedics;  Laterality: Right;  With ANCHORS  . TOTAL KNEE ARTHROPLASTY Left 02/12/2017  . TOTAL KNEE ARTHROPLASTY Left 02/12/2017   Procedure: LEFT TOTAL KNEE ARTHROPLASTY;  Surgeon: Leandrew Koyanagi, MD;  Location: [place];  Service: Orthopedics;  Laterality: Left;  . TOTAL KNEE REVISION Left 06/15/2018   Procedure: LEFT TOTAL KNEE REVISION;  Surgeon: Leandrew Koyanagi, MD;  Location: [place];  Service: Orthopedics;  Laterality: Left;  . TOTAL KNEE REVISION Left 03/08/2019   Procedure: LEFT TOTAL KNEE REVISION;  Surgeon: Leandrew Koyanagi, MD;  Location: [place];  Service: Orthopedics;  Laterality: Left;    ROS:  Review of Systems  Constitutional: Positive for weight loss. Negative for chills and fever.  Respiratory: Positive for shortness of breath. Negative for cough and wheezing.   Cardiovascular: Negative for chest pain and palpitations.  Respiratory: Shortness of breath related to beginning Amlodipine Negative except as stated above   PHYSICAL EXAM: BP (!) 151/83   Pulse 62   Temp 97.9 F (36.6 C) (Oral)   Resp 16   Wt 256 lb 6.4 oz (116.3 kg)   SpO2 96%   BMI 35.76 kg/m   Physical Exam General appearance - alert, well appearing, and in no distress and oriented to person, place, and time Mental status - alert, oriented to person, place,  and time, normal mood, behavior, speech, dress, motor activity, and thought processes Chest - clear to auscultation, no wheezes, rales or rhonchi, symmetric air entry, no tachypnea, retractions or cyanosis Heart - normal rate, regular rhythm, normal S1, S2, no murmurs, rubs, clicks or gallops, normal rate and regular rhythm, S1 and S2 normal, no murmurs noted, no gallops noted Musculoskeletal - full range of motion without pain, tenderness at left lower extremity shin, negative for edema of left lower extremity  CMP Latest Ref Rng & Units 03/09/2019 03/02/2019 06/16/2018  Glucose 70 - 99 mg/dL 112(H) 89 113(H)  BUN 6 - 20 mg/dL 12 13 15   Creatinine 0.61 - 1.24 mg/dL 0.88 0.89 1.01  Sodium 135 - 145 mmol/L 137 137 136  Potassium 3.5 - 5.1 mmol/L 4.3 4.2 3.9  Chloride 98 - 111 mmol/L 101 99 102  CO2 22 - 32 mmol/L 24 28 26   Calcium 8.9 - 10.3 mg/dL 9.2 9.4 8.6(L)  Total Protein 6.5 - 8.1 g/dL - 6.9 -  Total Bilirubin 0.3 - 1.2 mg/dL - 0.9 -  Alkaline Phos 38 - 126 U/L - 80 -  AST 15 - 41 U/L - 24 -  ALT 0 - 44 U/L - 28 -   Lipid Panel     Component Value Date/Time   CHOL 175 03/02/2018 0934   TRIG 116 03/02/2018 0934   HDL 54 03/02/2018 0934   CHOLHDL 3.2 03/02/2018 0934   LDLCALC 98 03/02/2018 0934    CBC    Component Value Date/Time   WBC 13.6 (H) 03/09/2019 0235   RBC 4.41 03/09/2019 0235   HGB 13.5 03/09/2019 0235   HGB 14.3 05/12/2018 0853   HCT 39.1 03/09/2019 0235   HCT 43.4 05/12/2018 0853   PLT 260 03/09/2019 0235   PLT 282 03/02/2018 0934   MCV 88.7 03/09/2019 0235   MCV 90 05/12/2018 0853   MCH 30.6 03/09/2019 0235   MCHC 34.5 03/09/2019 0235   RDW 12.8 03/09/2019 0235   RDW 13.1 05/12/2018 0853  LYMPHSABS 1.7 03/02/2019 0927   LYMPHSABS 1.4 05/12/2018 0853   MONOABS 0.7 03/02/2019 0927   EOSABS 0.2 03/02/2019 0927   EOSABS 0.2 05/12/2018 0853   BASOSABS 0.1 03/02/2019 0927   BASOSABS 0.0 05/12/2018 0853    ASSESSMENT AND PLAN: 1. HYPERTENSION:  -Take  Amlodipine (NORVASC); Take 1 tablet (10 mg) by mouth daily; Dispense: 90; Refill: 0 -Discussed with patient that Amlodipine was discontinued on last week related to concerns that the medication was causing increased swelling of the left lower extremity. Patient was started on Carvedilol in replacement of Amlodipine. Informed patient that swelling may return to left lower extremity as a result of resuming Amlodipine. -Discontinue taking Carvedilol (COREG); 1 tablet (6.25 mg total) by mouth 2 (two) times daily with a meal  -Monitor blood pressure readings at home and record readings in a journal -Follow-up with clinical pharmacist in 2 weeks for blood pressure check -Follow a Healthy Eating Plan - You can do it! . Limit sugary drinks.  Avoid sodas, sweet tea, sport or energy drinks, or fruit drinks.  Drink water, lo-fat milk, or diet drinks. . Limit snack foods.   Cut back on candy, cake, cookies, chips, ice cream.  These are a special treat, only in small amounts. . Eat plenty of vegetables.  Especially dark green, red, and orange vegetables. Aim for at least 3 servings a day. More is better!  Include fruit in your daily diet.  Whole fruit is much healthier than fruit juice! . Limit "white" bread, "white" pasta, "white" rice.   Choose "100% whole grain" products, brown or wild rice. [name] Avoid fatty meats. Try "Meatless Monday" and choose eggs or beans one day a week.  When eating meat, choose lean meats like chicken, [place], and fish.  Grill, broil, or bake meats instead of frying, and eat poultry without the skin. . Eat less salt.  Avoid frozen pizzas, frozen dinners and salty foods.  Use seasonings other than salt in cooking.  This can help blood pressure and keep you from swelling . Beer, wine and liquor have calories.  If you can safely drink alcohol, limit to 1 drink per day for women, 2 drinks for men . Patient have been counseled extensively about nutrition and exercise. Other issues discussed  during this visit include: low cholesterol diet, weight control and daily exercise, importance of adherence with medications and regular follow-up. We also discussed long term complications of uncontrolled hypertension.  2. PAIN IN LEFT SHIN:  -Last surgery in October 2020 for left knee revision related to left tibial component loosening subsidence. -X-ray of left knee June 03, 2019 results as stable left total knee without complication.  -X-ray of knee June 15, 2019 results no acute osseous abnormality and minimal degenerative changes.   -Additional imaging is not advised at this time. -Referral to orthopedic specialist may be required if pain continues -Discussed with patient that Amlodipine was discontinued on last week related to concerns that the medication was causing increased swelling of the left lower extremity. Patient was started on Carvedilol in replacement of Amlodipine. Informed patient that swelling may return to left lower extremity as a result of resuming Amlodipine.  There are no diagnoses linked to this encounter.   Patient was given the opportunity to ask questions.  Patient verbalized understanding of the plan and was able to repeat key elements of the plan.   No orders of the defined types were placed in this encounter.    Requested Prescriptions  No prescriptions requested or ordered in this encounter    No follow-ups on file.  Camillia Herter, NP

## 2019-06-17 NOTE — Patient Instructions (Addendum)
Return in 2 weeks for blood pressure check with clinical pharmacist. Resume Amlodipine. Continue Lisinopril-HCTZ. Hypertension, Adult Hypertension is another name for high blood pressure. High blood pressure forces your heart to work harder to pump blood. This can cause problems over time. There are two numbers in a blood pressure reading. There is a top number (systolic) over a bottom number (diastolic). It is best to have a blood pressure that is below 120/80. Healthy choices can help lower your blood pressure, or you may need medicine to help lower it. What are the causes? The cause of this condition is not known. Some conditions may be related to high blood pressure. What increases the risk?  Smoking.  Having type 2 diabetes mellitus, high cholesterol, or both.  Not getting enough exercise or physical activity.  Being overweight.  Having too much fat, sugar, calories, or salt (sodium) in your diet.  Drinking too much alcohol.  Having long-term (chronic) kidney disease.  Having a family history of high blood pressure.  Age. Risk increases with age.  Race. You may be at higher risk if you are African American.  Gender. Men are at higher risk than women before age 83. After age 39, women are at higher risk than men.  Having obstructive sleep apnea.  Stress. What are the signs or symptoms?  High blood pressure may not cause symptoms. Very high blood pressure (hypertensive crisis) may cause: ? Headache. ? Feelings of worry or nervousness (anxiety). ? Shortness of breath. ? Nosebleed. ? A feeling of being sick to your stomach (nausea). ? Throwing up (vomiting). ? Changes in how you see. ? Very bad chest pain. ? Seizures. How is this treated?  This condition is treated by making healthy lifestyle changes, such as: ? Eating healthy foods. ? Exercising more. ? Drinking less alcohol.  Your health care provider may prescribe medicine if lifestyle changes are not enough to  get your blood pressure under control, and if: ? Your top number is above 130. ? Your bottom number is above 80.  Your personal target blood pressure may vary. Follow these instructions at home: Eating and drinking   If told, follow the DASH eating plan. To follow this plan: ? Fill one half of your plate at each meal with fruits and vegetables. ? Fill one fourth of your plate at each meal with whole grains. Whole grains include whole-wheat pasta, brown rice, and whole-grain bread. ? Eat or drink low-fat dairy products, such as skim milk or low-fat yogurt. ? Fill one fourth of your plate at each meal with low-fat (lean) proteins. Low-fat proteins include fish, chicken without skin, eggs, beans, and tofu. ? Avoid fatty meat, cured and processed meat, or chicken with skin. ? Avoid pre-made or processed food.  Eat less than 1,500 mg of salt each day.  Do not drink alcohol if: ? Your doctor tells you not to drink. ? You are pregnant, may be pregnant, or are planning to become pregnant.  If you drink alcohol: ? Limit how much you use to:  0-1 drink a day for women.  0-2 drinks a day for men. ? Be aware of how much alcohol is in your drink. In the [place] one drink equals one 12 oz bottle of beer (355 mL), one 5 oz glass of wine (148 mL), or one 1 oz glass of hard liquor (44 mL). Lifestyle   Work with your doctor to stay at a healthy weight or to lose weight. Ask your doctor what  the best weight is for you.  Get at least 30 minutes of exercise most days of the week. This may include walking, swimming, or biking.  Get at least 30 minutes of exercise that strengthens your muscles (resistance exercise) at least 3 days a week. This may include lifting weights or doing Pilates.  Do not use any products that contain nicotine or tobacco, such as cigarettes, e-cigarettes, and chewing tobacco. If you need help quitting, ask your doctor.  Check your blood pressure at home as told by your  doctor.  Keep all follow-up visits as told by your doctor. This is important. Medicines  Take over-the-counter and prescription medicines only as told by your doctor. Follow directions carefully.  Do not skip doses of blood pressure medicine. The medicine does not work as well if you skip doses. Skipping doses also puts you at risk for problems.  Ask your doctor about side effects or reactions to medicines that you should watch for. Contact a doctor if you:  Think you are having a reaction to the medicine you are taking.  Have headaches that keep coming back (recurring).  Feel dizzy.  Have swelling in your ankles.  Have trouble with your vision. Get help right away if you:  Get a very bad headache.  Start to feel mixed up (confused).  Feel weak or numb.  Feel faint.  Have very bad pain in your: ? Chest. ? Belly (abdomen).  Throw up more than once.  Have trouble breathing. Summary  Hypertension is another name for high blood pressure.  High blood pressure forces your heart to work harder to pump blood.  For most people, a normal blood pressure is less than 120/80.  Making healthy choices can help lower blood pressure. If your blood pressure does not get lower with healthy choices, you may need to take medicine. This information is not intended to replace advice given to you by your health care provider. Make sure you discuss any questions you have with your health care provider. Document Revised: 01/21/2018 Document Reviewed: 01/21/2018 Elsevier Patient Education  2020 [employer].

## 2019-06-18 LAB — LIPID PANEL
Chol/HDL Ratio: 4.3 ratio (ref 0.0–5.0)
Cholesterol, Total: 189 mg/dL (ref 100–199)
HDL: 44 mg/dL (ref >39)
LDL Chol Calc (NIH): 112 mg/dL — ABNORMAL HIGH (ref 0–99)
Triglycerides: 189 mg/dL — ABNORMAL HIGH (ref 0–149)
VLDL Cholesterol Cal: 33 mg/dL (ref 5–40)

## 2019-06-18 LAB — MICROALBUMIN / CREATININE URINE RATIO
Creatinine, Urine: 33.1 mg/dL
Microalb/Creat Ratio: <9 mg/g creat (ref 0–29)
Microalbumin, Urine: <3 ug/mL

## 2019-06-18 LAB — HEMOGLOBIN A1C
Est. average glucose Bld gHb Est-mCnc: 111 mg/dL
Hgb A1c MFr Bld: 5.5 % (ref 4.8–5.6)

## 2019-06-18 LAB — BRAIN NATRIURETIC PEPTIDE: BNP: 12.4 pg/mL (ref 0.0–100.0)

## 2019-06-21 ENCOUNTER — Encounter: Payer: Self-pay | Admitting: Internal Medicine

## 2019-06-21 DIAGNOSIS — E785 Hyperlipidemia, unspecified: Secondary | ICD-10-CM

## 2019-06-21 MED ORDER — ATORVASTATIN CALCIUM 20 MG PO TABS: 20.0000 mg | ORAL_TABLET | Freq: Every day | ORAL | 6 refills | Status: DC

## 2019-07-02 ENCOUNTER — Encounter: Payer: Self-pay | Admitting: Orthopaedic Surgery

## 2019-07-05 NOTE — Telephone Encounter (Signed)
Yes that's fine to add.  Thanks.

## 2019-07-07 NOTE — Telephone Encounter (Signed)
indefinitely

## 2019-07-20 ENCOUNTER — Ambulatory Visit: Payer: BC Managed Care – PPO | Admitting: Internal Medicine

## 2019-07-30 ENCOUNTER — Ambulatory Visit: Payer: BC Managed Care – PPO | Admitting: Internal Medicine

## 2019-08-16 ENCOUNTER — Other Ambulatory Visit: Payer: Self-pay | Admitting: Internal Medicine

## 2019-08-16 DIAGNOSIS — G4700 Insomnia, unspecified: Secondary | ICD-10-CM

## 2019-08-17 ENCOUNTER — Encounter: Payer: Self-pay | Admitting: Internal Medicine

## 2019-09-02 ENCOUNTER — Other Ambulatory Visit: Payer: Self-pay | Admitting: Physician Assistant

## 2019-09-02 DIAGNOSIS — E785 Hyperlipidemia, unspecified: Secondary | ICD-10-CM

## 2019-09-10 ENCOUNTER — Ambulatory Visit: Payer: BC Managed Care – PPO | Admitting: Internal Medicine

## 2019-10-26 ENCOUNTER — Other Ambulatory Visit: Payer: Self-pay | Admitting: Physician Assistant

## 2019-10-26 DIAGNOSIS — I10 Essential (primary) hypertension: Secondary | ICD-10-CM

## 2019-10-28 ENCOUNTER — Encounter: Payer: Self-pay | Admitting: Internal Medicine

## 2019-10-28 DIAGNOSIS — I10 Essential (primary) hypertension: Secondary | ICD-10-CM

## 2019-10-28 MED ORDER — LISINOPRIL-HYDROCHLOROTHIAZIDE 20-25 MG PO TABS: 1.0000 | ORAL_TABLET | Freq: Every day | ORAL | 6 refills | Status: DC

## 2019-11-10 ENCOUNTER — Telehealth: Payer: Self-pay | Admitting: Orthopaedic Surgery

## 2019-11-10 ENCOUNTER — Encounter: Payer: Self-pay | Admitting: Orthopaedic Surgery

## 2019-11-10 NOTE — Telephone Encounter (Signed)
Continue same restrictions

## 2019-11-10 NOTE — Telephone Encounter (Signed)
What do I need to do?

## 2019-11-10 NOTE — Telephone Encounter (Signed)
Patient called asked if he can get a copy of the form that was sent by [facility]. The number to contact patient is [phone]

## 2019-11-11 NOTE — Telephone Encounter (Signed)
We have it, Marisue Ivan has it to do.

## 2019-11-11 NOTE — Telephone Encounter (Signed)
Do we have a copy of this?

## 2019-11-11 NOTE — Telephone Encounter (Signed)
Holding for [employer]. I called and spoke with patient. He said ok to wait until next week. Michael [name] please make sure that this is addressed first thing next week and contact patient once completed.

## 2019-11-16 NOTE — Telephone Encounter (Signed)
Pending. Roda Shutters has form in folder. Pending signature and restrictions if any.

## 2019-11-17 NOTE — Telephone Encounter (Signed)
Form was faxed yesterday. Patient aware. Sent message through [employer].

## 2019-12-10 ENCOUNTER — Ambulatory Visit: Payer: 59 | Admitting: Orthopaedic Surgery

## 2019-12-10 ENCOUNTER — Ambulatory Visit (INDEPENDENT_AMBULATORY_CARE_PROVIDER_SITE_OTHER): Payer: 59

## 2019-12-10 ENCOUNTER — Encounter: Payer: Self-pay | Admitting: Orthopaedic Surgery

## 2019-12-10 VITALS — Ht 71.0 in | Wt 268.8 lb

## 2019-12-10 DIAGNOSIS — Z96652 Presence of left artificial knee joint: Secondary | ICD-10-CM

## 2019-12-10 DIAGNOSIS — M1612 Unilateral primary osteoarthritis, left hip: Secondary | ICD-10-CM | POA: Diagnosis not present

## 2019-12-10 NOTE — Progress Notes (Signed)
Office Visit Note   Patient: Michael [name]           Date of Birth: 1960-11-20           MRN: [id number] Visit Date: 12/10/2019              Requested by: Marcine Matar, MD [address] [place],  [place] [address] PCP: Marcine Matar, MD   Assessment & Plan: Visit Diagnoses:  1. S/P revision of total knee, left   2. Primary osteoarthritis of left hip     Plan: Impression is 70-month status post left total knee revision and end-stage left hip DJD.  In regards to the knee revision I think he may be having pain around the distal tip of the tibial stem.  Based on discussion I think it is best to give this more time as I do not see any problems with the implant or any areas of bony reaction or stress risers.  For the left hip he has had cortisone injection in the past with temporary relief.  At this point he has decided to proceed with a left total hip replacement after discussion of risk benefits rehab recovery.  We will contact the patient in the near future to arrange this.  Follow-Up Instructions: Return if symptoms worsen or fail to improve.   Orders:  Orders Placed This Encounter  Procedures  . XR Knee 1-2 Views Left  . XR HIP UNILAT W OR W/O PELVIS 2-3 VIEWS LEFT   No orders of the defined types were placed in this encounter.     Procedures: No procedures performed   Clinical Data: No additional findings.   Subjective: Chief Complaint  Patient presents with  . Left Knee - Pain, Follow-up    Michael [name] is approximately 9 months status post left total knee revision.  I the knee itself feels good but he feels that he is having some pain around his mid shin and tibia region.  He states that it hurts after he walks for a while.  He does not feel like it is getting better.  He is also having severe left hip and groin pain.  This pain radiates into the thigh.   Review of Systems  Constitutional: Negative.   All other systems reviewed and are  negative.    Objective: Vital Signs: Ht 5\' 11"  (1.803 m)   Wt 268 lb 12.8 oz (121.9 kg)   BMI 37.49 kg/m   Physical Exam Vitals and nursing note reviewed.  Constitutional:      Appearance: He is well-developed.  Pulmonary:     Effort: Pulmonary effort is normal.  Abdominal:     Palpations: Abdomen is soft.  Skin:    General: Skin is warm.  Neurological:     Mental Status: He is alert and oriented to person, place, and time.  Psychiatric:        Behavior: Behavior normal.        Thought Content: Thought content normal.        Judgment: Judgment normal.     Ortho Exam Left hip shows limited range of motion with severe pain.  Left knee shows a fully healed surgical scar.  No swelling around the knee.  No signs of infection.  Range of motion of the knee is painless.  He has tenderness over the tibial crest. Specialty Comments:  No specialty comments available.  Imaging: XR HIP UNILAT W OR W/O PELVIS 2-3 VIEWS LEFT  Result Date:  12/10/2019 Advanced left hip DJD with complete joint space destruction.  XR Knee 1-2 Views Left  Result Date: 12/10/2019 Stable total knee revision.  No bony reaction around the tip of the tibial stem.    PMFS History: Patient Active Problem List   Diagnosis Date Noted  . Primary osteoarthritis of left hip 12/10/2019  . 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 Status post revision of total knee replacement, 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  . Unilateral primary osteoarthritis, left hip 09/08/2018  . S/P revision of total knee, left 06/15/2018  . Positive depression screening 05/22/2017  . Hepatitis C virus infection cured after antiviral drug therapy 01/19/2017  . Hyperlipidemia 10/23/2016  . Hypertension 10/18/2016  . Tobacco dependence 10/18/2016  . 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 Right rotator cuff tear 01/29/2013   Past Medical History:  Diagnosis Date  . Arthritis   . Hepatitis    Hep C rx   . Hypertension     Family History  Problem Relation Age of Onset  . CAD Mother   . Deep vein thrombosis Brother     Past Surgical History:  Procedure Laterality Date  . COLONOSCOPY    . left knee arthroscopy     . left rotator cuff surgery     . REVISION TOTAL KNEE ARTHROPLASTY Left 06/15/2018  . SHOULDER OPEN ROTATOR CUFF REPAIR Right 01/29/2013   Procedure: RIGHT SHOULDER MINI ROTATOR CUFF REPAIR;  Surgeon: Javier Docker, MD;  Location: [facility];  Service: Orthopedics;  Laterality: Right;  With ANCHORS  . TOTAL KNEE ARTHROPLASTY Left 02/12/2017  . TOTAL KNEE ARTHROPLASTY Left 02/12/2017   Procedure: LEFT TOTAL KNEE ARTHROPLASTY;  Surgeon: Tarry Kos, MD;  Location: [facility];  Service: Orthopedics;  Laterality: Left;  . TOTAL KNEE REVISION Left 06/15/2018   Procedure: LEFT TOTAL KNEE REVISION;  Surgeon: Tarry Kos, MD;  Location: [facility];  Service: Orthopedics;  Laterality: Left;  . TOTAL KNEE REVISION Left 03/08/2019   Procedure: LEFT TOTAL KNEE REVISION;  Surgeon: Tarry Kos, MD;  Location: [facility];  Service: Orthopedics;  Laterality: Left;   Social History   Occupational History  . Occupation: Chartered certified accountant  Tobacco Use  . Smoking status: Current Every Day Smoker    Packs/day: 1.00    Years: 37.00    Pack years: 37.00    Types: Cigarettes  . Smokeless tobacco: Never Used  Vaping Use  . Vaping Use: Never used  Substance and Sexual Activity  . Alcohol use: Not Currently  . Drug use: No    Comment: hx of marijuana and cocaine, LSD  . Sexual activity: Yes    Partners: Female    Birth control/protection: None

## 2019-12-22 ENCOUNTER — Encounter: Payer: Self-pay | Admitting: Orthopaedic Surgery

## 2019-12-22 ENCOUNTER — Telehealth: Payer: Self-pay

## 2019-12-22 NOTE — Telephone Encounter (Signed)
Patient called in wanting to schedule his surgery for dr yates.

## 2019-12-23 ENCOUNTER — Other Ambulatory Visit: Payer: Self-pay

## 2019-12-23 ENCOUNTER — Ambulatory Visit: Payer: 59 | Attending: Physician Assistant | Admitting: Physician Assistant

## 2019-12-23 ENCOUNTER — Encounter: Payer: Self-pay | Admitting: Physician Assistant

## 2019-12-23 DIAGNOSIS — E785 Hyperlipidemia, unspecified: Secondary | ICD-10-CM | POA: Diagnosis not present

## 2019-12-23 DIAGNOSIS — I10 Essential (primary) hypertension: Secondary | ICD-10-CM

## 2019-12-23 MED ORDER — LISINOPRIL-HYDROCHLOROTHIAZIDE 20-25 MG PO TABS: 1.0000 | ORAL_TABLET | Freq: Every day | ORAL | 1 refills | Status: DC

## 2019-12-23 MED ORDER — AMLODIPINE BESYLATE 10 MG PO TABS: 10.0000 mg | ORAL_TABLET | Freq: Every day | ORAL | 0 refills | Status: DC

## 2019-12-23 MED ORDER — ATORVASTATIN CALCIUM 20 MG PO TABS: 20.0000 mg | ORAL_TABLET | Freq: Every day | ORAL | 1 refills | Status: DC

## 2019-12-23 NOTE — Progress Notes (Signed)
Michael [name], is a 59 y.o. male  DGL:[id number]  [id number]  DOB - Mar 12, 1961  Subjective:  Chief Complaint and HPI: Michael [name] is a 59 y.o. male here today for BP and cholesterol.  His last lipids in January and no diabetes at that time.  No concerns or complaints.  No HA. No CP.  No dizziness.     ROS:   Constitutional:  No f/c, No night sweats, No unexplained weight loss. EENT:  No vision changes, No blurry vision, No hearing changes. No mouth, throat, or ear problems.  Respiratory: No cough, No SOB Cardiac: No CP, no palpitations GI:  No abd pain, No N/V/D. GU: No Urinary s/sx Musculoskeletal: No joint pain Neuro: No headache, no dizziness, no motor weakness.  Skin: No rash Endocrine:  No polydipsia. No polyuria.  Psych: Denies SI/HI  No problems updated.  ALLERGIES: Allergies  Allergen Reactions   Chantix [Varenicline] Itching and Rash    PAST MEDICAL HISTORY: Past Medical History:  Diagnosis Date   Arthritis    Hepatitis    Hep C rx   Hypertension     MEDICATIONS AT HOME: Prior to Admission medications   Medication Sig Start Date End Date Taking? Authorizing Provider  amLODipine (NORVASC) 10 MG tablet Take 1 tablet (10 mg total) by mouth daily. 12/23/19  Yes Anders Simmonds, PA-C  ibuprofen (ADVIL) 800 MG tablet Take 1 tablet (800 mg total) by mouth 3 (three) times daily. 05/15/19  Yes Yu, Amy V, PA-C  lisinopril-hydrochlorothiazide (ZESTORETIC) 20-25 MG tablet Take 1 tablet by mouth daily. 12/23/19  Yes Anders Simmonds, PA-C  methylPREDNISolone (MEDROL DOSEPAK) 4 MG TBPK tablet Take as directed 05/27/19  Yes Cristie Hem, PA-C  psyllium (METAMUCIL) 58.6 % powder Take 1 packet by mouth daily.   Yes [provider]  traZODone (DESYREL) 50 MG tablet TAKE 0.5 TABLETS (25 MG TOTAL) BY MOUTH AT BEDTIME AS NEEDED FOR SLEEP. 08/16/19  Yes Marcine Matar, MD  aspirin 81 MG chewable tablet Chew 1 tablet (81 mg total) by mouth 2 (two)  times daily. 03/09/19   Cristie Hem, PA-C  atorvastatin (LIPITOR) 20 MG tablet Take 1 tablet (20 mg total) by mouth daily. 12/23/19   Anders Simmonds, PA-C  methadone (DOLOPHINE) 5 MG tablet Take 60 mg by mouth daily. Do not order or prescribe. Patient reported medication.    [provider]  oxyCODONE (ROXICODONE) 5 MG immediate release tablet Take 1-2 tabs po q 6-8 hours prn pain Patient not taking: Reported on 03/30/2019 03/15/19   Cristie Hem, PA-C  carvedilol (COREG) 6.25 MG tablet Take 1 tablet (6.25 mg total) by mouth 2 (two) times daily with a meal. 06/10/19 06/17/19  Marcine Matar, MD     Objective:  EXAM:   Vitals:   12/23/19 1406  BP: (!) 138/79  Pulse: 101  Temp: 98.6 F (37 C)  TempSrc: Temporal  SpO2: 94%  Weight: (!) 271 lb (122.9 kg)  Height: 5\' 11"  (1.803 m)    General appearance : A&OX3. NAD. Non-toxic-appearing HEENT: Atraumatic and Normocephalic.  PERRLA. EOM intact.  Neck: supple, no JVD. No cervical lymphadenopathy. No thyromegaly Chest/Lungs:  Breathing-non-labored, Good air entry bilaterally, breath sounds normal without rales, rhonchi, or wheezing  CVS: S1 S2 regular, no murmurs, gallops, rubs  Extremities: Bilateral Lower Ext shows no edema, both legs are warm to touch with = pulse throughout Neurology:  CN II-XII grossly intact, Non focal.   Psych:  TP  linear. J/I WNL. Normal speech. Appropriate eye contact and affect.  Skin:  No Rash  Data Review Lab Results  Component Value Date   HGBA1C 5.5 06/17/2019   HGBA1C 5.4 03/02/2018     Assessment & Plan   1. Hyperlipidemia, unspecified hyperlipidemia type Resume meds - Basic metabolic panel - atorvastatin (LIPITOR) 20 MG tablet; Take 1 tablet (20 mg total) by mouth daily.  Dispense: 90 tablet; Refill: 1  2. Essential hypertension -suboptimal control but he has gained weight.  Work on healthier lifestyle.  Drink more water.  Eliminate sugars and processed/starchy food.  We  have discussed target BP range and blood pressure goal. I have advised patient to check BP regularly and to call us back or report to clinic if the numbers are consistently higher than 140/90. We discussed the importance of compliance with medical therapy and DASH diet recommended, consequences of uncontrolled hypertension discussed.  - Basic metabolic panel - lisinopril-hydrochlorothiazide (ZESTORETIC) 20-25 MG tablet; Take 1 tablet by mouth daily.  Dispense: 90 tablet; Refill: 1 - amLODipine (NORVASC) 10 MG tablet; Take 1 tablet (10 mg total) by mouth daily.  Dispense: 90 tablet; Refill: 0   Patient have been counseled extensively about nutrition and exercise  Return in about 6 months (around 06/24/2020) for PCP;  chronic conditions.  The patient was given clear instructions to go to ER or return to medical center if symptoms don't improve, worsen or new problems develop. The patient verbalized understanding. The patient was told to call to get lab results if they haven't heard anything in the next week.     Georgian Co, PA-C [facility] [place], [place] [phone]   12/23/2019, 2:23 PMPatient ID: Michael [name], male   DOB: 11/04/1960, 59 y.o.   MRN: [id number]

## 2019-12-23 NOTE — Telephone Encounter (Signed)
Please order bone scan.  Please let him know.

## 2019-12-24 ENCOUNTER — Other Ambulatory Visit: Payer: Self-pay

## 2019-12-24 DIAGNOSIS — Z96652 Presence of left artificial knee joint: Secondary | ICD-10-CM

## 2019-12-24 LAB — BASIC METABOLIC PANEL
BUN/Creatinine Ratio: 17 (ref 9–20)
BUN: 16 mg/dL (ref 6–24)
CO2: 25 mmol/L (ref 20–29)
Calcium: 9.5 mg/dL (ref 8.7–10.2)
Chloride: 98 mmol/L (ref 96–106)
Creatinine, Ser: 0.94 mg/dL (ref 0.76–1.27)
GFR calc Af Amer: 104 mL/min/{1.73_m2} (ref >59)
GFR calc non Af Amer: 90 mL/min/{1.73_m2} (ref >59)
Glucose: 101 mg/dL — ABNORMAL HIGH (ref 65–99)
Potassium: 3.9 mmol/L (ref 3.5–5.2)
Sodium: 137 mmol/L (ref 134–144)

## 2019-12-29 ENCOUNTER — Encounter: Payer: Self-pay | Admitting: *Deleted

## 2019-12-29 ENCOUNTER — Telehealth: Payer: Self-pay | Admitting: *Deleted

## 2019-12-29 NOTE — Telephone Encounter (Signed)
Please disregard, error

## 2019-12-31 ENCOUNTER — Telehealth: Payer: Self-pay | Admitting: Orthopaedic Surgery

## 2020-01-04 ENCOUNTER — Encounter (HOSPITAL_COMMUNITY)
Admission: RE | Admit: 2020-01-04 | Discharge: 2020-01-04 | Disposition: A | Discharge status: Home / Self Care | Payer: 59 | Source: Ambulatory Visit | Attending: Orthopaedic Surgery | Admitting: Orthopaedic Surgery

## 2020-01-04 ENCOUNTER — Other Ambulatory Visit: Payer: Self-pay

## 2020-01-04 DIAGNOSIS — Z96652 Presence of left artificial knee joint: Secondary | ICD-10-CM | POA: Diagnosis not present

## 2020-01-04 MED ORDER — TECHNETIUM TC 99M MEDRONATE IV KIT
20.0000 | PACK | Freq: Once | INTRAVENOUS | Status: AC | PRN
  Administered 2020-01-04: 20 via INTRAVENOUS

## 2020-01-05 NOTE — Progress Notes (Signed)
Patient needs to come in for blood work for CRP, ESR, CBC.  Thank you

## 2020-01-11 ENCOUNTER — Encounter: Payer: Self-pay | Admitting: Orthopaedic Surgery

## 2020-01-11 ENCOUNTER — Ambulatory Visit (INDEPENDENT_AMBULATORY_CARE_PROVIDER_SITE_OTHER): Payer: 59 | Admitting: Orthopaedic Surgery

## 2020-01-11 VITALS — Ht 71.0 in | Wt 269.6 lb

## 2020-01-11 DIAGNOSIS — Z96652 Presence of left artificial knee joint: Secondary | ICD-10-CM | POA: Diagnosis not present

## 2020-01-11 NOTE — Addendum Note (Signed)
Addended by: Albertina Parr on: 01/11/2020 01:28 PM   Modules accepted: Orders

## 2020-01-11 NOTE — Progress Notes (Signed)
Office Visit Note   Patient: Michael [name]           Date of Birth: September 05, 1960           MRN: [id number] Visit Date: 01/11/2020              Requested by: Marcine Matar, MD [address] [place],  [place] [address] PCP: Marcine Matar, MD   Assessment & Plan: Visit Diagnoses:  1. S/P revision of total knee, left     Plan: Impression is chronic left knee pain status post revision.  I reviewed the bone scan which is suspicious for loosening although his knee revision was less than a year ago which could just mean that this is still postsurgical findings.  I do not feel that he has an infection either.  After discussion he would like to seek out a second opinion.  I have made a referral to Dr. Dewayne Hatch of [facility] in [place].   Follow-Up Instructions: Return if symptoms worsen or fail to improve.   Orders:  No orders of the defined types were placed in this encounter.  No orders of the defined types were placed in this encounter.     Procedures: No procedures performed   Clinical Data: No additional findings.   Subjective: Chief Complaint  Patient presents with  . Left Knee - Pain    Yotam returns today for bone scan review.  He denies any changes.  He continues to have pain in his lower leg.   Review of Systems  Constitutional: Negative.   All other systems reviewed and are negative.    Objective: Vital Signs: Ht 5\' 11"  (1.803 m)   Wt 269 lb 9.6 oz (122.3 kg)   BMI 37.60 kg/m   Physical Exam Vitals and nursing note reviewed.  Constitutional:      Appearance: He is well-developed.  Pulmonary:     Effort: Pulmonary effort is normal.  Abdominal:     Palpations: Abdomen is soft.  Skin:    General: Skin is warm.  Neurological:     Mental Status: He is alert and oriented to person, place, and time.  Psychiatric:        Behavior: Behavior normal.        Thought Content: Thought content normal.        Judgment: Judgment  normal.     Ortho Exam He reports pain deep in the lower leg on the anterior lateral side.  Palpation of the anterior compartment does not elicit any pain.  No pain with ankle dorsiflexion or plantarflexion.  Surgical scar is fully healed.  No joint effusion.  No bony tenderness to palpation.  Proximal tibia is nontender to palpation. Specialty Comments:  No specialty comments available.  Imaging: No results found.   PMFS History: Patient Active Problem List   Diagnosis Date Noted  . Primary osteoarthritis of left hip 12/10/2019  . 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 Status post revision of total knee replacement, 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  . Unilateral primary osteoarthritis, left hip 09/08/2018  . S/P revision of total knee, left 06/15/2018  . Positive depression screening 05/22/2017  . Hepatitis C virus infection cured after antiviral drug therapy 01/19/2017  . Hyperlipidemia 10/23/2016  . Hypertension 10/18/2016  . Tobacco dependence 10/18/2016  . 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 Right rotator cuff tear 01/29/2013   Past Medical History:  Diagnosis Date  . Arthritis   .  Hepatitis    Hep C rx  . Hypertension     Family History  Problem Relation Age of Onset  . CAD Mother   . Deep vein thrombosis Brother     Past Surgical History:  Procedure Laterality Date  . COLONOSCOPY    . left knee arthroscopy     . left rotator cuff surgery     . REVISION TOTAL KNEE ARTHROPLASTY Left 06/15/2018  . SHOULDER OPEN ROTATOR CUFF REPAIR Right 01/29/2013   Procedure: RIGHT SHOULDER MINI ROTATOR CUFF REPAIR;  Surgeon: Javier Docker, MD;  Location: [facility];  Service: Orthopedics;  Laterality: Right;  With ANCHORS  . TOTAL KNEE ARTHROPLASTY Left 02/12/2017  . TOTAL KNEE ARTHROPLASTY Left 02/12/2017   Procedure: LEFT TOTAL KNEE ARTHROPLASTY;  Surgeon: Tarry Kos, MD;  Location: [facility];  Service: Orthopedics;  Laterality: Left;  . TOTAL KNEE  REVISION Left 06/15/2018   Procedure: LEFT TOTAL KNEE REVISION;  Surgeon: Tarry Kos, MD;  Location: [facility];  Service: Orthopedics;  Laterality: Left;  . TOTAL KNEE REVISION Left 03/08/2019   Procedure: LEFT TOTAL KNEE REVISION;  Surgeon: Tarry Kos, MD;  Location: [facility];  Service: Orthopedics;  Laterality: Left;   Social History   Occupational History  . Occupation: Chartered certified accountant  Tobacco Use  . Smoking status: Current Some Day Smoker    Packs/day: 1.00    Years: 37.00    Pack years: 37.00    Types: Cigarettes  . Smokeless tobacco: Never Used  Vaping Use  . Vaping Use: Never used  Substance and Sexual Activity  . Alcohol use: Not Currently  . Drug use: No    Comment: hx of marijuana and cocaine, LSD  . Sexual activity: Yes    Partners: Female    Birth control/protection: None

## 2020-02-18 IMAGING — NM NM BONE 3 PHASE
8 series · 18 of 18 positions shown · non-contrast
Comparison: None

Correlation: LEFT knee radiographs 11/16/2017, CT LEFT knee
12/11/2017

CLINICAL DATA: LEFT knee pain, prior LEFT knee replacement
January 2017, was fine until returning to work in May 2017
when he developed swelling, sudden sharp pain of medial LEFT knee,
question loosening

EXAM:
NUCLEAR MEDICINE 3-PHASE BONE SCAN
TECHNIQUE: Radionuclide angiographic images, immediate static blood pool
images, and 3-hour delayed static images were obtained of the knees
after intravenous injection of radiopharmaceutical.
RADIOPHARMACEUTICALS:  21.8 mCi Cc-99m MDP IV

[Series 1: flow · 2.07mm/px · 6 of 48 frames shown (1 of 2)]
[frame 5/48]
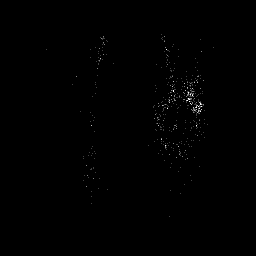
[frame 13/48  full-range]
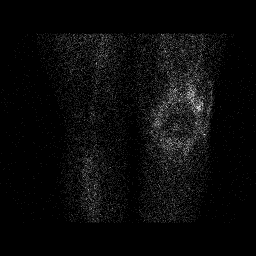
[frame 21/48  full-range]
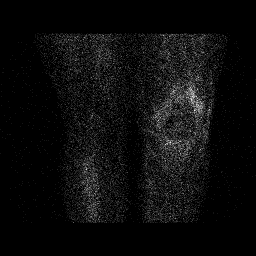
[frame 29/48  full-range]
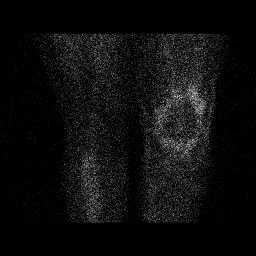
[frame 37/48  full-range]
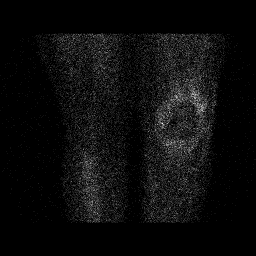
[frame 45/48  full-range]
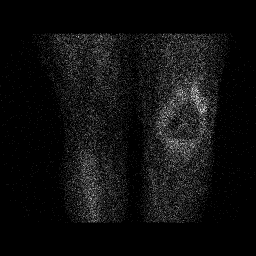

[Series 1: flow · 2.07mm/px · 6 of 48 frames shown (2 of 2)]
[frame 5/48]
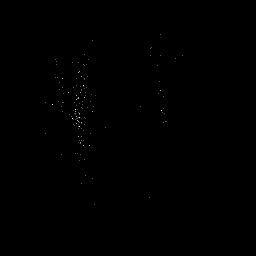
[frame 13/48  full-range]
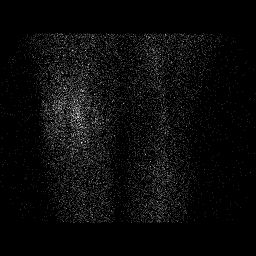
[frame 21/48  full-range]
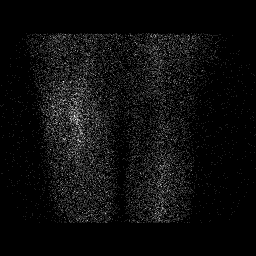
[frame 29/48  full-range]
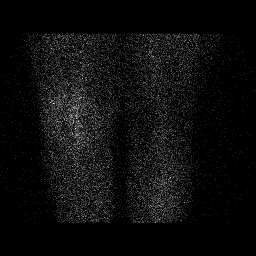
[frame 37/48  full-range]
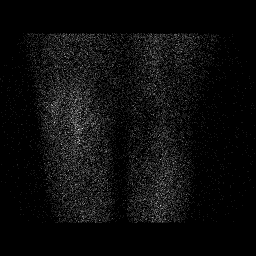
[frame 45/48  full-range]
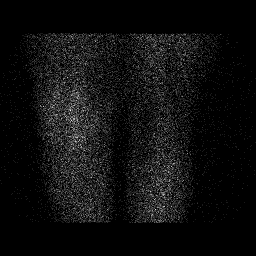

[Series 2: blood pool · 2.07mm/px · 1 of 1 slices shown (1 of 2)]
[im 1/1  full-range]
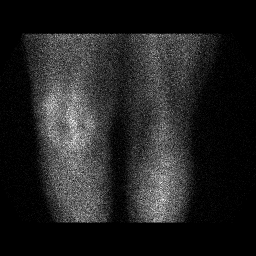

[Series 2: blood pool · 2.07mm/px · 1 of 1 slices shown (2 of 2)]
[im 1/1]
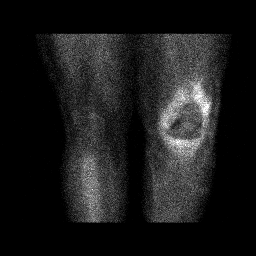

[Series 3: lat bp · 2.07mm/px · 1 of 1 slices shown (1 of 2)]
[im 1/1  full-range]
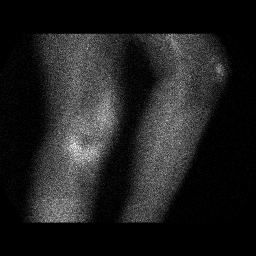

[Series 3: lat bp · 2.07mm/px · 1 of 1 slices shown (2 of 2)]
[im 1/1]
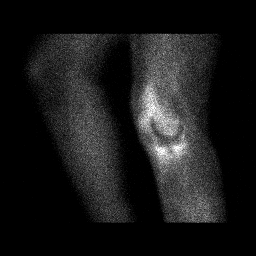

[Series 4: delay · delayed · 2.07mm/px · 1 of 1 slices shown (1 of 2)]
[im 1/1]
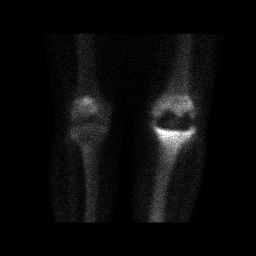

[Series 5: delay · delayed · 2.07mm/px · 1 of 1 slices shown (2 of 2)]
[im 1/1]
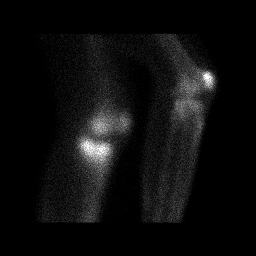

[18 of 18 positions shown; findings below may reference images not displayed]

FINDINGS: Vascular phase: Increased blood flow diffusely about LEFT knee
joint. Normal blood flow to RIGHT knee. Normal blood flow to RIGHT
knee

Blood pool phase: Diffusely increased blood pool surrounding LEFT
knee. Normal blood pool at RIGHT knee.

Delayed phase: Photopenic defect LEFT knee from prosthetic hardware.
Increased tracer localization throughout the LEFT tibial metaphysis
adjacent to the tibial component of the prosthesis suspicious for
either aseptic loosening or infection of the prosthesis. No abnormal
tracer localization adjacent to the femoral component of the
prosthesis. Normal tracer uptake at RIGHT knee.
IMPRESSION: Increased blood flow and blood pool tracer at the LEFT knee with
delayed abnormal increased tracer accumulation adjacent to the
tibial component of the LEFT knee prosthesis suspicious for either
aseptic loosening or infection of the prosthesis.

## 2020-03-02 ENCOUNTER — Ambulatory Visit: Payer: BC Managed Care – PPO | Admitting: Orthopaedic Surgery

## 2020-03-15 ENCOUNTER — Other Ambulatory Visit: Payer: Self-pay | Admitting: Physician Assistant

## 2020-03-15 DIAGNOSIS — I10 Essential (primary) hypertension: Secondary | ICD-10-CM

## 2020-03-15 NOTE — Telephone Encounter (Signed)
Requested medications are due for refill today?  Yes  Requested medications are on active medication list?  Yes  Last Refill:   12/23/2019  # 90 with no  refills   Future visit scheduled?  No   Notes to Clinic:  Please see below.    Why Am I Seeing These Alternatives?   amLODipine (NORVASC) 10 MG tablet [Pharmacy Med Name: AMLODIPINE BESYLATE 10 MG TAB] is not on the preferred formulary for the patient's insurance plan. Below are alternatives which are likely to be more affordable. Do not assume that every medication presented is a clinically appropriate alternative.  These alternatives are medications that are in the same pharmaceutical subclass (Calcium Channel Blockers) as the ordered medication and are on formulary for the patient's insurance plan.

## 2020-03-28 ENCOUNTER — Encounter: Payer: Self-pay | Admitting: Orthopaedic Surgery

## 2020-03-30 ENCOUNTER — Encounter: Payer: Self-pay | Admitting: Internal Medicine

## 2020-03-30 ENCOUNTER — Other Ambulatory Visit: Payer: Self-pay

## 2020-03-30 ENCOUNTER — Ambulatory Visit: Payer: 59 | Attending: Internal Medicine | Admitting: Internal Medicine

## 2020-03-30 VITALS — BP 136/80 | HR 86 | Temp 97.8°F | Ht 71.0 in | Wt 269.0 lb

## 2020-03-30 DIAGNOSIS — F17200 Nicotine dependence, unspecified, uncomplicated: Secondary | ICD-10-CM | POA: Diagnosis not present

## 2020-03-30 DIAGNOSIS — Z23 Encounter for immunization: Secondary | ICD-10-CM | POA: Diagnosis not present

## 2020-03-30 DIAGNOSIS — Z01818 Encounter for other preprocedural examination: Secondary | ICD-10-CM

## 2020-03-30 DIAGNOSIS — Z6839 Body mass index (BMI) 39.0-39.9, adult: Secondary | ICD-10-CM | POA: Diagnosis not present

## 2020-03-30 DIAGNOSIS — M1612 Unilateral primary osteoarthritis, left hip: Secondary | ICD-10-CM | POA: Diagnosis not present

## 2020-03-30 DIAGNOSIS — Z01812 Encounter for preprocedural laboratory examination: Secondary | ICD-10-CM | POA: Diagnosis present

## 2020-03-30 DIAGNOSIS — E669 Obesity, unspecified: Secondary | ICD-10-CM | POA: Diagnosis not present

## 2020-03-30 DIAGNOSIS — Z79899 Other long term (current) drug therapy: Secondary | ICD-10-CM | POA: Insufficient documentation

## 2020-03-30 DIAGNOSIS — I10 Essential (primary) hypertension: Secondary | ICD-10-CM

## 2020-03-30 DIAGNOSIS — F17210 Nicotine dependence, cigarettes, uncomplicated: Secondary | ICD-10-CM | POA: Insufficient documentation

## 2020-03-30 DIAGNOSIS — Z7982 Long term (current) use of aspirin: Secondary | ICD-10-CM | POA: Diagnosis not present

## 2020-03-30 MED ORDER — NICOTINE POLACRILEX 2 MG MT GUM: 2.0000 mg | CHEWING_GUM | OROMUCOSAL | 0 refills | Status: DC | PRN

## 2020-03-30 MED ORDER — HYDRALAZINE HCL 10 MG PO TABS: 10.0000 mg | ORAL_TABLET | Freq: Two times a day (BID) | ORAL | 4 refills | Status: DC

## 2020-03-30 NOTE — Progress Notes (Signed)
Having left hip surgery first, then having knee surgery later down the line.

## 2020-03-30 NOTE — Patient Instructions (Signed)
Your blood pressure is not at goal of 130/80.  We have added Hydralazine 10 mg twice a day t get your blood pressure better.  I have sent the prescription for the nicotine patches to your pharmacy.

## 2020-03-30 NOTE — Progress Notes (Signed)
Patient ID: Michael [name], male    DOB: [birth year]  MRN: [id number]  CC: Pre-op Exam   Subjective: Michael [name] is a 59 y.o. male who presents for preoperative evaluation.  He will be needing left hip replacement surgery  His concerns today include:  Pt with hx ofHTN,HL, OA LT hip and lumbar spine,Hep C treated with interferon/rib combo in [place] 2005, tobacco abuse, substance use disorderwas onMethadone in past  Patient will be having hip replacement surgery by Dr. Linna Caprice of the [facility].  This will be done with spinal anesthesia.  Patient has had surgeries on his knees and shoulder before.  He denies any problems with anesthesia in the past.  He is not as active as he would like to be at this time but states on good days he is able to walk at least 2 blocks.  Denies any chest pains or shortness of breath at rest on exertion.  No palpitations.  He has not had any lower extremity edema.  HTN: elev today.  Has home device.  Checks daily.  Reported home blood pressure range has been BP 129/80-85 Limits salt in his foods Currently on Norvasc and Lisinopril/HCTZ and takes consistently.  Took medicines already for today.  Off Methadone 2 mths  Tob dep: He is cut down.  He is down to about 12 cigarettes a day.  Wants to quit.   Chantix causes facial rash.  Patches do not help.  He is willing to try the nicotine gum.  HM:  Completed Pfizer COVID vaccine.  Will get flu shot today  Patient Active Problem List   Diagnosis Date Noted  . Primary osteoarthritis of left hip 12/10/2019  . 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 Status post revision of total knee replacement, 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  . Unilateral primary osteoarthritis, left hip 09/08/2018  . S/P revision of total knee, left 06/15/2018  . Positive depression screening 05/22/2017  . Hepatitis C virus infection cured after antiviral drug therapy 01/19/2017  . Hyperlipidemia 10/23/2016  .  Hypertension 10/18/2016  . Tobacco dependence 10/18/2016  . 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 Right rotator cuff tear 01/29/2013     Current Outpatient Medications on File Prior to Visit  Medication Sig Dispense Refill  . amLODipine (NORVASC) 10 MG tablet TAKE 1 TABLET BY MOUTH EVERY DAY 90 tablet 0  . atorvastatin (LIPITOR) 20 MG tablet Take 1 tablet (20 mg total) by mouth daily. 90 tablet 1  . ibuprofen (ADVIL) 800 MG tablet Take 1 tablet (800 mg total) by mouth 3 (three) times daily. 21 tablet 0  . lisinopril-hydrochlorothiazide (ZESTORETIC) 20-25 MG tablet Take 1 tablet by mouth daily. 90 tablet 1  . psyllium (METAMUCIL) 58.6 % powder Take 1 packet by mouth daily.    . traZODone (DESYREL) 50 MG tablet TAKE 0.5 TABLETS (25 MG TOTAL) BY MOUTH AT BEDTIME AS NEEDED FOR SLEEP. 45 tablet 0  . aspirin 81 MG chewable tablet Chew 1 tablet (81 mg total) by mouth 2 (two) times daily. 84 tablet 0  . methadone (DOLOPHINE) 5 MG tablet Take 60 mg by mouth daily. Do not order or prescribe. Patient reported medication.    . [DISCONTINUED] carvedilol (COREG) 6.25 MG tablet Take 1 tablet (6.25 mg total) by mouth 2 (two) times daily with a meal. 60 tablet 3   No current facility-administered medications on file prior to visit.    Allergies  Allergen Reactions  . Chantix [  Varenicline] Itching and Rash    Social History   Socioeconomic History  . Marital status: Single    Spouse name: Not on file  . Number of children: 0  . Years of education: 62  . Highest education level: Not on file  Occupational History  . Occupation: Chartered certified accountant  Tobacco Use  . Smoking status: Current Some Day Smoker    Packs/day: 1.00    Years: 37.00    Pack years: 37.00    Types: Cigarettes  . Smokeless tobacco: Never Used  Vaping Use  . Vaping Use: Never used  Substance and Sexual Activity  . Alcohol use: Not Currently  . Drug use: No    Comment: hx of marijuana and cocaine, LSD  . Sexual activity:  Yes    Partners: Female    Birth control/protection: None  Other Topics Concern  . Not on file  Social History Narrative  . Not on file   Social Determinants of Health   Financial Resource Strain:   . Difficulty of Paying Living Expenses: Not on file  Food Insecurity:   . Worried About Programme researcher, broadcasting/film/video in the Last Year: Not on file  . Ran Out of Food in the Last Year: Not on file  Transportation Needs:   . Lack of Transportation (Medical): Not on file  . Lack of Transportation (Non-Medical): Not on file  Physical Activity:   . Days of Exercise per Week: Not on file  . Minutes of Exercise per Session: Not on file  Stress:   . Feeling of Stress : Not on file  Social Connections:   . Frequency of Communication with Friends and Family: Not on file  . Frequency of Social Gatherings with Friends and Family: Not on file  . Attends Religious Services: Not on file  . Active Member of Clubs or Organizations: Not on file  . Attends Banker Meetings: Not on file  . Marital Status: Not on file  Intimate Partner Violence:   . Fear of Current or Ex-Partner: Not on file  . Emotionally Abused: Not on file  . Physically Abused: Not on file  . Sexually Abused: Not on file    Family History  Problem Relation Age of Onset  . CAD Mother   . Deep vein thrombosis Brother     Past Surgical History:  Procedure Laterality Date  . COLONOSCOPY    . left knee arthroscopy     . left rotator cuff surgery     . REVISION TOTAL KNEE ARTHROPLASTY Left 06/15/2018  . SHOULDER OPEN ROTATOR CUFF REPAIR Right 01/29/2013   Procedure: RIGHT SHOULDER MINI ROTATOR CUFF REPAIR;  Surgeon: Javier Docker, MD;  Location: [facility];  Service: Orthopedics;  Laterality: Right;  With ANCHORS  . TOTAL KNEE ARTHROPLASTY Left 02/12/2017  . TOTAL KNEE ARTHROPLASTY Left 02/12/2017   Procedure: LEFT TOTAL KNEE ARTHROPLASTY;  Surgeon: Tarry Kos, MD;  Location: [facility];  Service: Orthopedics;  Laterality: Left;   . TOTAL KNEE REVISION Left 06/15/2018   Procedure: LEFT TOTAL KNEE REVISION;  Surgeon: Tarry Kos, MD;  Location: [facility];  Service: Orthopedics;  Laterality: Left;  . TOTAL KNEE REVISION Left 03/08/2019   Procedure: LEFT TOTAL KNEE REVISION;  Surgeon: Tarry Kos, MD;  Location: [facility];  Service: Orthopedics;  Laterality: Left;    ROS: Review of Systems Negative except as stated above  PHYSICAL EXAM: BP 136/80   Pulse 86   Temp 97.8 F (36.6 C) (  Oral)   Ht 5\' 11"  (1.803 m)   Wt 269 lb (122 kg)   SpO2 98%   BMI 37.52 kg/m   Wt Readings from Last 3 Encounters:  03/30/20 269 lb (122 kg)  01/11/20 269 lb 9.6 oz (122.3 kg)  12/23/19 (!) 271 lb (122.9 kg)   136/80, 140/80 Physical Exam   General appearance - alert, well appearing, and in no distress Mental status - normal mood, behavior, speech, dress, motor activity, and thought processes Eyes - pupils equal and reactive, extraocular eye movements intact Mouth - mucous membranes moist, pharynx normal without lesions Neck - supple, no significant adenopathy Chest - clear to auscultation, no wheezes, rales or rhonchi, symmetric air entry Heart - normal rate, regular rhythm, normal S1, S2, no murmurs, rubs, clicks or gallops Extremities - peripheral pulses normal, no pedal edema, no clubbing or cyanosis  CMP Latest Ref Rng & Units 12/23/2019 03/09/2019 03/02/2019  Glucose 65 - 99 mg/dL 05/02/2019) 573(U) 89  BUN 6 - 24 mg/dL 16 12 13   Creatinine 0.76 - 1.27 mg/dL 202(R 4.27  Sodium 134 - 144 mmol/L 137 137 137  Potassium 3.5 - 5.2 mmol/L 3.9 4.3 4.2  Chloride 96 - 106 mmol/L 98 101 99  CO2 20 - 29 mmol/L 25 24 28   Calcium 8.7 - 10.2 mg/dL 9.5 9.2 9.4  Total Protein 6.5 - 8.1 g/dL - - 6.9  Total Bilirubin 0.3 - 1.2 mg/dL - - 0.9  Alkaline Phos 38 - 126 U/L - - 80  AST 15 - 41 U/L - - 24  ALT 0 - 44 U/L - - 28   Lipid Panel     Component Value Date/Time   CHOL 189 06/17/2019 0935   TRIG 189 (H) 06/17/2019 0935   HDL  44 06/17/2019 0935   CHOLHDL 4.3 06/17/2019 0935   LDLCALC 112 (H) 06/17/2019 0935    CBC    Component Value Date/Time   WBC 13.6 (H) 03/09/2019 0235   RBC 4.41 03/09/2019 0235   HGB 13.5 03/09/2019 0235   HGB 14.3 05/12/2018 0853   HCT 39.1 03/09/2019 0235   HCT 43.4 05/12/2018 0853   PLT 260 03/09/2019 0235   PLT 282 03/02/2018 0934   MCV 88.7 03/09/2019 0235   MCV 90 05/12/2018 0853   MCH 30.6 03/09/2019 0235   MCHC 34.5 03/09/2019 0235   RDW 12.8 03/09/2019 0235   RDW 13.1 05/12/2018 0853   LYMPHSABS 1.7 03/02/2019 0927   LYMPHSABS 1.4 05/12/2018 0853   MONOABS 0.7 03/02/2019 0927   EOSABS 0.2 03/02/2019 0927   EOSABS 0.2 05/12/2018 0853   BASOSABS 0.1 03/02/2019 0927   BASOSABS 0.0 05/12/2018 0853    ASSESSMENT AND PLAN: 1. Encounter for preoperative assessment -Patient does have factors that put him at risk including blood pressure, tobacco dependence, obesity and decreased activity level.  However he has had several surgeries in the past 2 to 3 years and has not had issues with anesthesia.  The main thing right now is that he needs to quit smoking for at least 4 weeks prior to surgery and this is actually requested by the surgeon on their preoperative clearance form. 2. Essential hypertension Diastolic blood pressure not at goal.  Add hydralazine.  Continue Norvasc and lisinopril/HCTZ. - hydrALAZINE (APRESOLINE) 10 MG tablet; Take 1 tablet (10 mg total) by mouth 2 (two) times daily.  Dispense: 60 tablet; Refill: 4 - CBC - Comprehensive metabolic panel - Lipid panel  3. Tobacco dependence Advised  to quit.  Discussed health risks associated with smoking.  Encouraged him to try to quit at least 4 weeks prior to surgery.  Patient will try to quit.  He would like to try the nicotine gum.  We will bring him back in 4 weeks.  Once he is quit we can go ahead and clear him for surgery. - nicotine polacrilex (NICORETTE) 2 MG gum; Take 1 each (2 mg total) by mouth as needed for  smoking cessation. Max 30 pieces/day  Dispense: 100 tablet; Refill: 0  4. Obesity (BMI 35.0-39.9 without comorbidity) Discussed and encourage healthy eating habits- Hemoglobin A1c  5. Need for immunization against influenza  - Flu Vaccine QUAD 36+ mos IM    Patient was given the opportunity to ask questions.  Patient verbalized understanding of the plan and was able to repeat key elements of the plan.   Orders Placed This Encounter  Procedures  . Flu Vaccine QUAD 36+ mos IM  . CBC  . Comprehensive metabolic panel  . Lipid panel  . Hemoglobin A1c     Requested Prescriptions   Signed Prescriptions Disp Refills  . hydrALAZINE (APRESOLINE) 10 MG tablet 60 tablet 4    Sig: Take 1 tablet (10 mg total) by mouth 2 (two) times daily.  . nicotine polacrilex (NICORETTE) 2 MG gum 100 tablet 0    Sig: Take 1 each (2 mg total) by mouth as needed for smoking cessation. Max 30 pieces/day    No follow-ups on file.  Jonah Blueeborah Mayzie Caughlin, MD, FACP

## 2020-03-31 LAB — LIPID PANEL
Chol/HDL Ratio: 3.8 ratio (ref 0.0–5.0)
Cholesterol, Total: 197 mg/dL (ref 100–199)
HDL: 52 mg/dL (ref >39)
LDL Chol Calc (NIH): 99 mg/dL (ref 0–99)
Triglycerides: 272 mg/dL — ABNORMAL HIGH (ref 0–149)
VLDL Cholesterol Cal: 46 mg/dL — ABNORMAL HIGH (ref 5–40)

## 2020-03-31 LAB — COMPREHENSIVE METABOLIC PANEL
ALT: 22 IU/L (ref 0–44)
AST: 19 IU/L (ref 0–40)
Albumin/Globulin Ratio: 1.7 (ref 1.2–2.2)
Albumin: 4.6 g/dL (ref 3.8–4.9)
Alkaline Phosphatase: 111 IU/L (ref 44–121)
BUN/Creatinine Ratio: 20 (ref 9–20)
BUN: 16 mg/dL (ref 6–24)
Bilirubin Total: 0.3 mg/dL (ref 0.0–1.2)
CO2: 25 mmol/L (ref 20–29)
Calcium: 9.6 mg/dL (ref 8.7–10.2)
Chloride: 96 mmol/L (ref 96–106)
Creatinine, Ser: 0.8 mg/dL (ref 0.76–1.27)
GFR calc Af Amer: 114 mL/min/{1.73_m2} (ref >59)
GFR calc non Af Amer: 98 mL/min/{1.73_m2} (ref >59)
Globulin, Total: 2.7 g/dL (ref 1.5–4.5)
Glucose: 90 mg/dL (ref 65–99)
Potassium: 4.2 mmol/L (ref 3.5–5.2)
Sodium: 137 mmol/L (ref 134–144)
Total Protein: 7.3 g/dL (ref 6.0–8.5)

## 2020-03-31 LAB — CBC
Hematocrit: 45.4 % (ref 37.5–51.0)
Hemoglobin: 15.1 g/dL (ref 13.0–17.7)
MCH: 30.1 pg (ref 26.6–33.0)
MCHC: 33.3 g/dL (ref 31.5–35.7)
MCV: 90 fL (ref 79–97)
Platelets: 339 10*3/uL (ref 150–450)
RBC: 5.02 x10E6/uL (ref 4.14–5.80)
RDW: 12.8 % (ref 11.6–15.4)
WBC: 10.2 10*3/uL (ref 3.4–10.8)

## 2020-03-31 LAB — HEMOGLOBIN A1C
Est. average glucose Bld gHb Est-mCnc: 117 mg/dL
Hgb A1c MFr Bld: 5.7 % — ABNORMAL HIGH (ref 4.8–5.6)

## 2020-04-03 ENCOUNTER — Encounter: Payer: Self-pay | Admitting: Internal Medicine

## 2020-04-17 ENCOUNTER — Encounter: Payer: 59 | Admitting: Internal Medicine

## 2020-05-01 ENCOUNTER — Other Ambulatory Visit: Payer: Self-pay

## 2020-05-01 ENCOUNTER — Encounter: Payer: Self-pay | Admitting: Internal Medicine

## 2020-05-01 ENCOUNTER — Ambulatory Visit: Payer: 59 | Attending: Internal Medicine | Admitting: Internal Medicine

## 2020-05-01 VITALS — BP 130/80 | HR 94 | Temp 98.2°F | Resp 16 | Wt 267.0 lb

## 2020-05-01 DIAGNOSIS — F17210 Nicotine dependence, cigarettes, uncomplicated: Secondary | ICD-10-CM | POA: Insufficient documentation

## 2020-05-01 DIAGNOSIS — F1120 Opioid dependence, uncomplicated: Secondary | ICD-10-CM

## 2020-05-01 DIAGNOSIS — E669 Obesity, unspecified: Secondary | ICD-10-CM | POA: Diagnosis not present

## 2020-05-01 DIAGNOSIS — F17200 Nicotine dependence, unspecified, uncomplicated: Secondary | ICD-10-CM | POA: Diagnosis not present

## 2020-05-01 DIAGNOSIS — Z6839 Body mass index (BMI) 39.0-39.9, adult: Secondary | ICD-10-CM | POA: Diagnosis not present

## 2020-05-01 DIAGNOSIS — I10 Essential (primary) hypertension: Secondary | ICD-10-CM | POA: Insufficient documentation

## 2020-05-01 DIAGNOSIS — R7303 Prediabetes: Secondary | ICD-10-CM | POA: Insufficient documentation

## 2020-05-01 DIAGNOSIS — Z01818 Encounter for other preprocedural examination: Secondary | ICD-10-CM | POA: Diagnosis not present

## 2020-05-01 NOTE — Progress Notes (Signed)
Patient ID: Michael [name], male    DOB: [birth year]  MRN: [id number]  CC: Follow-up (4 week )   Subjective: Michael [name] is a 59 y.o. male who presents for 1 mth f/u to get surgical clearance for LT hip surgery. His concerns today include:  Pt with hx ofHTN,HL,preDM, obesity, OA LT hip and lumbar spine,Hep C treated with interferon/rib combo in [place] 2005, tobacco abuse, substance use disorderwas onMethadone in past  Patient was seen 1 month ago for medical clearance for left total hip replacement surgery.  Part of the requirement set forth by the surgeon is that if the patient is a smoker, he needs to be tobacco free for 1 month or more prior to surgery.  Patient was made aware of this.  He has been working on quitting.  When I last saw him he was down to 12 cigarettes a day.  We prescribed the nicotine gum for him.  He reports that it has been hard as his girlfriend and roommate smokes but he has not smoked in the past 29 days.  He is using the nicotine gum which he finds helpful.  Start back on Methadone through [facility] 2 wks ago because he was in severe pain and did not want to resort to purchasing rxn narcotics from off the streets On Methadone 50 mg daily.   HTN:  Home BP range 140/90 No CP/SOB Some swelling in legs when he does a lot of walking.  Does some walking but not as much as he would like because of pain in LT leg.  PreDM:  Based on labs done on last visit. Reports wgh fluctuates b/w 259-269.  Thinks he retains water.  Has cut back on salt intact. Cut back on bread intake Eats about 3 apples a day and manderine.  This surgery will be done with spinal anesthesia.  Patient has had surgeries on his knees and shoulder before.  He denies any problems with anesthesia in the past.  He is not as active as he would like to be at this time but states on good days he is able to walk at least 2 blocks.  Denies any chest pains or shortness of breath at rest on exertion.  No  palpitations.   Patient Active Problem List   Diagnosis Date Noted  . Primary osteoarthritis of left hip 12/10/2019  . 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 Status post revision of total knee replacement, 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  . Unilateral primary osteoarthritis, left hip 09/08/2018  . S/P revision of total knee, left 06/15/2018  . Positive depression screening 05/22/2017  . Hepatitis C virus infection cured after antiviral drug therapy 01/19/2017  . Hyperlipidemia 10/23/2016  . Hypertension 10/18/2016  . Tobacco dependence 10/18/2016  . 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 Right rotator cuff tear 01/29/2013     Current Outpatient Medications on File Prior to Visit  Medication Sig Dispense Refill  . amLODipine (NORVASC) 10 MG tablet TAKE 1 TABLET BY MOUTH EVERY DAY 90 tablet 0  . aspirin 81 MG chewable tablet Chew 1 tablet (81 mg total) by mouth 2 (two) times daily. 84 tablet 0  . atorvastatin (LIPITOR) 20 MG tablet Take 1 tablet (20 mg total) by mouth daily. 90 tablet 1  . hydrALAZINE (APRESOLINE) 10 MG tablet Take 1 tablet (10 mg total) by mouth 2 (two) times daily. 60 tablet 4  . ibuprofen (ADVIL) 800 MG tablet Take 1 tablet (800  mg total) by mouth 3 (three) times daily. 21 tablet 0  . lisinopril-hydrochlorothiazide (ZESTORETIC) 20-25 MG tablet Take 1 tablet by mouth daily. 90 tablet 1  . methadone (DOLOPHINE) 5 MG tablet Take 50 mg by mouth daily. Do not order or prescribe. Patient reported medication.     . nicotine polacrilex (NICORETTE) 2 MG gum Take 1 each (2 mg total) by mouth as needed for smoking cessation. Max 30 pieces/day 100 tablet 0  . psyllium (METAMUCIL) 58.6 % powder Take 1 packet by mouth daily.    . traZODone (DESYREL) 50 MG tablet TAKE 0.5 TABLETS (25 MG TOTAL) BY MOUTH AT BEDTIME AS NEEDED FOR SLEEP. 45 tablet 0  . [DISCONTINUED] carvedilol (COREG) 6.25 MG tablet Take 1 tablet (6.25 mg total) by mouth 2 (two) times daily  with a meal. 60 tablet 3   No current facility-administered medications on file prior to visit.    Allergies  Allergen Reactions  . Chantix [Varenicline] Itching and Rash    Social History   Socioeconomic History  . Marital status: Single    Spouse name: Not on file  . Number of children: 0  . Years of education: 75  . Highest education level: Not on file  Occupational History  . Occupation: Chartered certified accountant  Tobacco Use  . Smoking status: Current Some Day Smoker    Packs/day: 1.00    Years: 37.00    Pack years: 37.00    Types: Cigarettes  . Smokeless tobacco: Never Used  Vaping Use  . Vaping Use: Never used  Substance and Sexual Activity  . Alcohol use: Not Currently  . Drug use: No    Comment: hx of marijuana and cocaine, LSD  . Sexual activity: Yes    Partners: Female    Birth control/protection: None  Other Topics Concern  . Not on file  Social History Narrative  . Not on file   Social Determinants of Health   Financial Resource Strain:   . Difficulty of Paying Living Expenses: Not on file  Food Insecurity:   . Worried About Programme researcher, broadcasting/film/video in the Last Year: Not on file  . Ran Out of Food in the Last Year: Not on file  Transportation Needs:   . Lack of Transportation (Medical): Not on file  . Lack of Transportation (Non-Medical): Not on file  Physical Activity:   . Days of Exercise per Week: Not on file  . Minutes of Exercise per Session: Not on file  Stress:   . Feeling of Stress : Not on file  Social Connections:   . Frequency of Communication with Friends and Family: Not on file  . Frequency of Social Gatherings with Friends and Family: Not on file  . Attends Religious Services: Not on file  . Active Member of Clubs or Organizations: Not on file  . Attends Banker Meetings: Not on file  . Marital Status: Not on file  Intimate Partner Violence:   . Fear of Current or Ex-Partner: Not on file  . Emotionally Abused: Not on file  .  Physically Abused: Not on file  . Sexually Abused: Not on file    Family History  Problem Relation Age of Onset  . CAD Mother   . Deep vein thrombosis Brother     Past Surgical History:  Procedure Laterality Date  . COLONOSCOPY    . left knee arthroscopy     . left rotator cuff surgery     . REVISION TOTAL KNEE ARTHROPLASTY  Left 06/15/2018  . SHOULDER OPEN ROTATOR CUFF REPAIR Right 01/29/2013   Procedure: RIGHT SHOULDER MINI ROTATOR CUFF REPAIR;  Surgeon: Javier Docker, MD;  Location: [facility];  Service: Orthopedics;  Laterality: Right;  With ANCHORS  . TOTAL KNEE ARTHROPLASTY Left 02/12/2017  . TOTAL KNEE ARTHROPLASTY Left 02/12/2017   Procedure: LEFT TOTAL KNEE ARTHROPLASTY;  Surgeon: Tarry Kos, MD;  Location: [facility];  Service: Orthopedics;  Laterality: Left;  . TOTAL KNEE REVISION Left 06/15/2018   Procedure: LEFT TOTAL KNEE REVISION;  Surgeon: Tarry Kos, MD;  Location: [facility];  Service: Orthopedics;  Laterality: Left;  . TOTAL KNEE REVISION Left 03/08/2019   Procedure: LEFT TOTAL KNEE REVISION;  Surgeon: Tarry Kos, MD;  Location: [facility];  Service: Orthopedics;  Laterality: Left;    ROS: Review of Systems Negative except as stated above  PHYSICAL EXAM: BP 130/80   Pulse 94   Temp 98.2 F (36.8 C)   Resp 16   Wt 267 lb (121.1 kg)   SpO2 96%   BMI 37.24 kg/m   Wt Readings from Last 3 Encounters:  05/01/20 267 lb (121.1 kg)  03/30/20 269 lb (122 kg)  01/11/20 269 lb 9.6 oz (122.3 kg)    Physical Exam  General appearance - alert, well appearing, obese middle-age Caucasian male and in no distress Mental status - normal mood, behavior, speech, dress, motor activity, and thought processes Eyes - pupils equal and reactive, extraocular eye movements intact Nose - normal and patent, no erythema, discharge or polyps Mouth - mucous membranes moist, pharynx normal without lesions Neck - supple, no significant adenopathy.  No thyromegaly. Lymphatics -no cervical  axillary lymphadenopathy Chest - clear to auscultation, no wheezes, rales or rhonchi, symmetric air entry Heart - normal rate, regular rhythm, normal S1, S2, no murmurs, rubs, clicks or gallops Abdomen -obese.  Normal bowel sounds.  Soft and nontender.  No masses appreciated.   Extremities -no lower extremity edema at this time. CMP Latest Ref Rng & Units 03/30/2020 12/23/2019 03/09/2019  Glucose 65 - 99 mg/dL 90 660(Y) 301(S)  BUN 6 - 24 mg/dL 16 16 12   Creatinine 0.76 - 1.27 mg/dL 0.10 9.32  Sodium 134 - 144 mmol/L 137 137 137  Potassium 3.5 - 5.2 mmol/L 4.2 3.9 4.3  Chloride 96 - 106 mmol/L 96 98 101  CO2 20 - 29 mmol/L 25 25 24   Calcium 8.7 - 10.2 mg/dL 9.6 9.5 9.2  Total Protein 6.0 - 8.5 g/dL 7.3 - -  Total Bilirubin 0.0 - 1.2 mg/dL 0.3 - -  Alkaline Phos 44 - 121 IU/L 111 - -  AST 0 - 40 IU/L 19 - -  ALT 0 - 44 IU/L 22 - -   Lipid Panel     Component Value Date/Time   CHOL 197 03/30/2020 1456   TRIG 272 (H) 03/30/2020 1456   HDL 52 03/30/2020 1456   CHOLHDL 3.8 03/30/2020 1456   LDLCALC 99 03/30/2020 1456    CBC    Component Value Date/Time   WBC 10.2 03/30/2020 1456   WBC 13.6 (H) 03/09/2019 0235   RBC 5.02 03/30/2020 1456   RBC 4.41 03/09/2019 0235   HGB 15.1 03/30/2020 1456   HCT 45.4 03/30/2020 1456   PLT 339 03/30/2020 1456   MCV 90 03/30/2020 1456   MCH 30.1 03/30/2020 1456   MCH 30.6 03/09/2019 0235   MCHC 33.3 03/30/2020 1456   MCHC 34.5 03/09/2019 0235   RDW 12.8 03/30/2020  1456   LYMPHSABS 1.7 03/02/2019 0927   LYMPHSABS 1.4 05/12/2018 0853   MONOABS 0.7 03/02/2019 0927   EOSABS 0.2 03/02/2019 0927   EOSABS 0.2 05/12/2018 0853   BASOSABS 0.1 03/02/2019 0927   BASOSABS 0.0 05/12/2018 0853   Lab Results  Component Value Date   HGBA1C 5.7 (H) 03/30/2020   EKG: EKG repeated on this visit given that patient was recently restarted on methadone.  EKG reveals sinus rhythm with occasional PAC.  no prolonged QT interval.  ASSESSMENT AND PLAN: 1.  Pre-operative clearance Patient does have factors that put him at risk including blood pressure, tobacco dependence, obesity and decreased activity level.  However he has had several surgeries in the past 2 to 3 years and has not had issues with anesthesia.  No concerning symptoms at this time that would warrant further testing prior to surgery.  2. Essential hypertension At goal.  Continue current medications  3. Tobacco dependence Patient reports that he has not smoked in the past 29 days.  I have commended him on this and encouraged him to remain tobacco free as a requirement for his surgery and to continue thereafter to improve his overall health.  Patient expressed understanding.  Less than 5 minutes spent on counseling.  4. Prediabetes 5. Obesity (BMI 35.0-39.9 without comorbidity) Discussed and encourage healthy eating habits.  Hopefully once he has his surgery he would be able to move more.  6. Patient on methadone maintenance therapy (HCC) - EKG 12-Lead   Patient was given the opportunity to ask questions.  Patient verbalized understanding of the plan and was able to repeat key elements of the plan.   Orders Placed This Encounter  Procedures  . EKG 12-Lead     Requested Prescriptions    No prescriptions requested or ordered in this encounter    Return in about 3 months (around 07/30/2020).  Jonah Blueeborah Arlyn Bumpus, MD, FACP

## 2020-05-01 NOTE — Patient Instructions (Signed)
Please remain free of tobacco/cigarettes. Continue your current blood pressure medications. I will complete your preoperative form and send it to the orthopedic specialist.   Healthy Eating Following a healthy eating pattern may help you to achieve and maintain a healthy body weight, reduce the risk of chronic disease, and live a long and productive life. It is important to follow a healthy eating pattern at an appropriate calorie level for your body. Your nutritional needs should be met primarily through food by choosing a variety of nutrient-rich foods. What are tips for following this plan? Reading food labels  Read labels and choose the following: ? Reduced or low sodium. ? Juices with 100% fruit juice. ? Foods with low saturated fats and high polyunsaturated and monounsaturated fats. ? Foods with whole grains, such as whole wheat, cracked wheat, brown rice, and wild rice. ? Whole grains that are fortified with folic acid. This is recommended for women who are pregnant or who want to become pregnant.  Read labels and avoid the following: ? Foods with a lot of added sugars. These include foods that contain brown sugar, corn sweetener, corn syrup, dextrose, fructose, glucose, high-fructose corn syrup, honey, invert sugar, lactose, malt syrup, maltose, molasses, raw sugar, sucrose, trehalose, or turbinado sugar.  Do not eat more than the following amounts of added sugar per day:  6 teaspoons (25 g) for women.  9 teaspoons (38 g) for men. ? Foods that contain processed or refined starches and grains. ? Refined grain products, such as white flour, degermed cornmeal, white bread, and white rice. Shopping  Choose nutrient-rich snacks, such as vegetables, whole fruits, and nuts. Avoid high-calorie and high-sugar snacks, such as potato chips, fruit snacks, and candy.  Use oil-based dressings and spreads on foods instead of solid fats such as butter, stick margarine, or cream  cheese.  Limit pre-made sauces, mixes, and "instant" products such as flavored rice, instant noodles, and ready-made pasta.  Try more plant-protein sources, such as tofu, tempeh, black beans, edamame, lentils, nuts, and seeds.  Explore eating plans such as the Mediterranean diet or vegetarian diet. Cooking  Use oil to saut or stir-fry foods instead of solid fats such as butter, stick margarine, or lard.  Try baking, boiling, grilling, or broiling instead of frying.  Remove the fatty part of meats before cooking.  Steam vegetables in water or broth. Meal planning   At meals, imagine dividing your plate into fourths: ? One-half of your plate is fruits and vegetables. ? One-fourth of your plate is whole grains. ? One-fourth of your plate is protein, especially lean meats, poultry, eggs, tofu, beans, or nuts.  Include low-fat dairy as part of your daily diet. Lifestyle  Choose healthy options in all settings, including home, work, school, restaurants, or stores.  Prepare your food safely: ? Wash your hands after handling raw meats. ? Keep food preparation surfaces clean by regularly washing with hot, soapy water. ? Keep raw meats separate from ready-to-eat foods, such as fruits and vegetables. ? Cook seafood, meat, poultry, and eggs to the recommended internal temperature. ? Store foods at safe temperatures. In general:  Keep cold foods at 56F (4.4C) or below.  Keep hot foods at 156F (60C) or above.  Keep your freezer at [facility] (-17.8C) or below.  Foods are no longer safe to eat when they have been between the temperatures of 40-156F (4.4-60C) for more than 2 hours. What foods should I eat? Fruits Aim to eat 2 cup-equivalents of fresh, canned (in  natural juice), or frozen fruits each day. Examples of 1 cup-equivalent of fruit include 1 small apple, 8 large strawberries, 1 cup canned fruit,  cup dried fruit, or 1 cup 100% juice. Vegetables Aim to eat 2-3  cup-equivalents of fresh and frozen vegetables each day, including different varieties and colors. Examples of 1 cup-equivalent of vegetables include 2 medium carrots, 2 cups raw, leafy greens, 1 cup chopped vegetable (raw or cooked), or 1 medium baked potato. Grains Aim to eat 6 ounce-equivalents of whole grains each day. Examples of 1 ounce-equivalent of grains include 1 slice of bread, 1 cup ready-to-eat cereal, 3 cups popcorn, or  cup cooked rice, pasta, or cereal. Meats and other proteins Aim to eat 5-6 ounce-equivalents of protein each day. Examples of 1 ounce-equivalent of protein include 1 egg, 1/2 cup nuts or seeds, or 1 tablespoon (16 g) peanut butter. A cut of meat or fish that is the size of a deck of cards is about 3-4 ounce-equivalents.  Of the protein you eat each week, try to have at least 8 ounces come from seafood. This includes salmon, trout, herring, and anchovies. Dairy Aim to eat 3 cup-equivalents of fat-free or low-fat dairy each day. Examples of 1 cup-equivalent of dairy include 1 cup (240 mL) milk, 8 ounces (250 g) yogurt, 1 ounces (44 g) natural cheese, or 1 cup (240 mL) fortified soy milk. Fats and oils  Aim for about 5 teaspoons (21 g) per day. Choose monounsaturated fats, such as canola and olive oils, avocados, peanut butter, and most nuts, or polyunsaturated fats, such as sunflower, corn, and soybean oils, walnuts, pine nuts, sesame seeds, sunflower seeds, and flaxseed. Beverages  Aim for six 8-oz glasses of water per day. Limit coffee to three to five 8-oz cups per day.  Limit caffeinated beverages that have added calories, such as soda and energy drinks.  Limit alcohol intake to no more than 1 drink a day for nonpregnant women and 2 drinks a day for men. One drink equals 12 oz of beer (355 mL), 5 oz of wine (148 mL), or 1 oz of hard liquor (44 mL). Seasoning and other foods  Avoid adding excess amounts of salt to your foods. Try flavoring foods with herbs and  spices instead of salt.  Avoid adding sugar to foods.  Try using oil-based dressings, sauces, and spreads instead of solid fats. This information is based on general [place] nutrition guidelines. For more information, visit [employer]. Exact amounts may vary based on your nutrition needs. Summary  A healthy eating plan may help you to maintain a healthy weight, reduce the risk of chronic diseases, and stay active throughout your life.  Plan your meals. Make sure you eat the right portions of a variety of nutrient-rich foods.  Try baking, boiling, grilling, or broiling instead of frying.  Choose healthy options in all settings, including home, work, school, restaurants, or stores. This information is not intended to replace advice given to you by your health care provider. Make sure you discuss any questions you have with your health care provider. Document Revised: 08/25/2017 Document Reviewed: 08/25/2017 Elsevier Patient Education  [place].

## 2020-05-03 DIAGNOSIS — R7303 Prediabetes: Secondary | ICD-10-CM | POA: Insufficient documentation

## 2020-05-09 ENCOUNTER — Ambulatory Visit: Payer: Self-pay | Admitting: Student

## 2020-05-09 NOTE — H&P (View-Only) (Signed)
TOTAL HIP ADMISSION H&P  Patient is admitted for left total hip arthroplasty.  Subjective:  Chief Complaint: left hip pain  HPI: Michael [name], 59 y.o. male, has a history of pain and functional disability in the left hip(s) due to arthritis and patient has failed non-surgical conservative treatments for greater than 12 weeks to include NSAID's and/or analgesics and corticosteriod injections.  Onset of symptoms was gradual starting 2 years ago with gradually worsening course since that time.The patient noted no past surgery on the left hip(s).  Patient currently rates pain in the left hip at 8 out of 10 with activity. Patient has worsening of pain with activity and weight bearing, pain that interfers with activities of daily living and pain with passive range of motion. Patient has evidence of joint space narrowing by imaging studies. This condition presents safety issues increasing the risk of falls. There is no current active infection.  Patient Active Problem List   Diagnosis Date Noted  . Prediabetes 05/03/2020  . Primary osteoarthritis of left hip 12/10/2019  . 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 Status post revision of total knee replacement, 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  . Unilateral primary osteoarthritis, left hip 09/08/2018  . S/P revision of total knee, left 06/15/2018  . Positive depression screening 05/22/2017  . Hepatitis C virus infection cured after antiviral drug therapy 01/19/2017  . Hyperlipidemia 10/23/2016  . Hypertension 10/18/2016  . Tobacco dependence 10/18/2016  . 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 Right rotator cuff tear 01/29/2013   Past Medical History:  Diagnosis Date  . Arthritis   . Hepatitis    Hep C rx  . Hypertension     Past Surgical History:  Procedure Laterality Date  . COLONOSCOPY    . left knee arthroscopy     . left rotator cuff surgery     . REVISION TOTAL KNEE ARTHROPLASTY Left 06/15/2018  .  SHOULDER OPEN ROTATOR CUFF REPAIR Right 01/29/2013   Procedure: RIGHT SHOULDER MINI ROTATOR CUFF REPAIR;  Surgeon: Javier Docker, MD;  Location: [facility];  Service: Orthopedics;  Laterality: Right;  With ANCHORS  . TOTAL KNEE ARTHROPLASTY Left 02/12/2017  . TOTAL KNEE ARTHROPLASTY Left 02/12/2017   Procedure: LEFT TOTAL KNEE ARTHROPLASTY;  Surgeon: Tarry Kos, MD;  Location: [facility];  Service: Orthopedics;  Laterality: Left;  . TOTAL KNEE REVISION Left 06/15/2018   Procedure: LEFT TOTAL KNEE REVISION;  Surgeon: Tarry Kos, MD;  Location: [facility];  Service: Orthopedics;  Laterality: Left;  . TOTAL KNEE REVISION Left 03/08/2019   Procedure: LEFT TOTAL KNEE REVISION;  Surgeon: Tarry Kos, MD;  Location: [facility];  Service: Orthopedics;  Laterality: Left;    Current Outpatient Medications  Medication Sig Dispense Refill Last Dose  . amLODipine (NORVASC) 10 MG tablet TAKE 1 TABLET BY MOUTH EVERY DAY (Patient taking differently: Take 10 mg by mouth daily.) 90 tablet 0   . atorvastatin (LIPITOR) 20 MG tablet Take 1 tablet (20 mg total) by mouth daily. 90 tablet 1   . hydrALAZINE (APRESOLINE) 10 MG tablet Take 1 tablet (10 mg total) by mouth 2 (two) times daily. 60 tablet 4   . ibuprofen (ADVIL) 200 MG tablet Take 400-800 mg by mouth every 6 (six) hours as needed for moderate pain.     [name] lisinopril-hydrochlorothiazide (ZESTORETIC) 20-25 MG tablet Take 1 tablet by mouth daily. 90 tablet 1   . nicotine polacrilex (NICORETTE) 2 MG gum Take 1 each (  2 mg total) by mouth as needed for smoking cessation. Max 30 pieces/day 100 tablet 0   . traZODone (DESYREL) 50 MG tablet TAKE 0.5 TABLETS (25 MG TOTAL) BY MOUTH AT BEDTIME AS NEEDED FOR SLEEP. 45 tablet 0    No current facility-administered medications for this visit.   Allergies  Allergen Reactions  . Chantix [Varenicline] Itching and Rash    Social History   Tobacco Use  . Smoking status: Current Some Day Smoker    Packs/day: 1.00    Years: 37.00     Pack years: 37.00    Types: Cigarettes  . Smokeless tobacco: Never Used  Substance Use Topics  . Alcohol use: Not Currently    Family History  Problem Relation Age of Onset  . CAD Mother   . Deep vein thrombosis Brother      Review of Systems  Musculoskeletal: Positive for arthralgias.  All other systems reviewed and are negative.   Objective:  Physical Exam Vitals reviewed.  HENT:     Head: Normocephalic.  Eyes:     Pupils: Pupils are equal, round, and reactive to light.  Cardiovascular:     Rate and Rhythm: Normal rate and regular rhythm.     Heart sounds: Normal heart sounds.  Pulmonary:     Breath sounds: Normal breath sounds.  Abdominal:     Palpations: Abdomen is soft.     Tenderness: There is no abdominal tenderness.  Genitourinary:    Comments: Deferred Musculoskeletal:     Cervical back: Normal range of motion.     Comments: Examination of the left hip reveals no skin wounds or lesions. Mild trochanteric tenderness to palpation. He has severely restricted range of motion of the left hip. Pain in the position of impingement. Pain with terminal flexion and rotation  Skin:    General: Skin is warm and dry.  Neurological:     Mental Status: He is alert and oriented to person, place, and time.  Psychiatric:        Mood and Affect: Mood normal.     Vital signs in last 24 hours: @VSRANGES @  Labs:   Estimated body mass index is 37.24 kg/m as calculated from the following:   Height as of 03/30/20: 5\' 11"  (1.803 m).   Weight as of 05/01/20: 121.1 kg.   Imaging Review Plain radiographs demonstrate severe degenerative joint disease of the left hip(s). The bone quality appears to be adequate for age and reported activity level.      Assessment/Plan:  End stage arthritis, left hip(s)  The patient history, physical examination, clinical judgement of the provider and imaging studies are consistent with end stage degenerative joint disease of the left  hip(s) and total hip arthroplasty is deemed medically necessary. The treatment options including medical management, injection therapy, arthroscopy and arthroplasty were discussed at length. The risks and benefits of total hip arthroplasty were presented and reviewed. The risks due to aseptic loosening, infection, stiffness, dislocation/subluxation,  thromboembolic complications and other imponderables were discussed.  The patient acknowledged the explanation, agreed to proceed with the plan and consent was signed. Patient is being admitted for inpatient treatment for surgery, pain control, PT, OT, prophylactic antibiotics, VTE prophylaxis, progressive ambulation and ADL's and discharge planning.The patient is planning to be discharged home with same day discharge

## 2020-05-09 NOTE — H&P (Signed)
TOTAL HIP ADMISSION H&P  Patient is admitted for left total hip arthroplasty.  Subjective:  Chief Complaint: left hip pain  HPI: Michael [name], 59 y.o. male, has a history of pain and functional disability in the left hip(s) due to arthritis and patient has failed non-surgical conservative treatments for greater than 12 weeks to include NSAID's and/or analgesics and corticosteriod injections.  Onset of symptoms was gradual starting 2 years ago with gradually worsening course since that time.The patient noted no past surgery on the left hip(s).  Patient currently rates pain in the left hip at 8 out of 10 with activity. Patient has worsening of pain with activity and weight bearing, pain that interfers with activities of daily living and pain with passive range of motion. Patient has evidence of joint space narrowing by imaging studies. This condition presents safety issues increasing the risk of falls. There is no current active infection.  Patient Active Problem List   Diagnosis Date Noted  . Prediabetes 05/03/2020  . Primary osteoarthritis of left hip 12/10/2019  . 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 Status post revision of total knee replacement, 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  . Unilateral primary osteoarthritis, left hip 09/08/2018  . S/P revision of total knee, left 06/15/2018  . Positive depression screening 05/22/2017  . Hepatitis C virus infection cured after antiviral drug therapy 01/19/2017  . Hyperlipidemia 10/23/2016  . Hypertension 10/18/2016  . Tobacco dependence 10/18/2016  . 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 Right rotator cuff tear 01/29/2013   Past Medical History:  Diagnosis Date  . Arthritis   . Hepatitis    Hep C rx  . Hypertension     Past Surgical History:  Procedure Laterality Date  . COLONOSCOPY    . left knee arthroscopy     . left rotator cuff surgery     . REVISION TOTAL KNEE ARTHROPLASTY Left 06/15/2018  .  SHOULDER OPEN ROTATOR CUFF REPAIR Right 01/29/2013   Procedure: RIGHT SHOULDER MINI ROTATOR CUFF REPAIR;  Surgeon: Javier Docker, MD;  Location: [facility];  Service: Orthopedics;  Laterality: Right;  With ANCHORS  . TOTAL KNEE ARTHROPLASTY Left 02/12/2017  . TOTAL KNEE ARTHROPLASTY Left 02/12/2017   Procedure: LEFT TOTAL KNEE ARTHROPLASTY;  Surgeon: Tarry Kos, MD;  Location: [facility];  Service: Orthopedics;  Laterality: Left;  . TOTAL KNEE REVISION Left 06/15/2018   Procedure: LEFT TOTAL KNEE REVISION;  Surgeon: Tarry Kos, MD;  Location: [facility];  Service: Orthopedics;  Laterality: Left;  . TOTAL KNEE REVISION Left 03/08/2019   Procedure: LEFT TOTAL KNEE REVISION;  Surgeon: Tarry Kos, MD;  Location: [facility];  Service: Orthopedics;  Laterality: Left;    Current Outpatient Medications  Medication Sig Dispense Refill Last Dose  . amLODipine (NORVASC) 10 MG tablet TAKE 1 TABLET BY MOUTH EVERY DAY (Patient taking differently: Take 10 mg by mouth daily.) 90 tablet 0   . atorvastatin (LIPITOR) 20 MG tablet Take 1 tablet (20 mg total) by mouth daily. 90 tablet 1   . hydrALAZINE (APRESOLINE) 10 MG tablet Take 1 tablet (10 mg total) by mouth 2 (two) times daily. 60 tablet 4   . ibuprofen (ADVIL) 200 MG tablet Take 400-800 mg by mouth every 6 (six) hours as needed for moderate pain.     [name] lisinopril-hydrochlorothiazide (ZESTORETIC) 20-25 MG tablet Take 1 tablet by mouth daily. 90 tablet 1   . nicotine polacrilex (NICORETTE) 2 MG gum Take 1 each (  2 mg total) by mouth as needed for smoking cessation. Max 30 pieces/day 100 tablet 0   . traZODone (DESYREL) 50 MG tablet TAKE 0.5 TABLETS (25 MG TOTAL) BY MOUTH AT BEDTIME AS NEEDED FOR SLEEP. 45 tablet 0    No current facility-administered medications for this visit.   Allergies  Allergen Reactions  . Chantix [Varenicline] Itching and Rash    Social History   Tobacco Use  . Smoking status: Current Some Day Smoker    Packs/day: 1.00    Years: 37.00     Pack years: 37.00    Types: Cigarettes  . Smokeless tobacco: Never Used  Substance Use Topics  . Alcohol use: Not Currently    Family History  Problem Relation Age of Onset  . CAD Mother   . Deep vein thrombosis Brother      Review of Systems  Musculoskeletal: Positive for arthralgias.  All other systems reviewed and are negative.   Objective:  Physical Exam Vitals reviewed.  HENT:     Head: Normocephalic.  Eyes:     Pupils: Pupils are equal, round, and reactive to light.  Cardiovascular:     Rate and Rhythm: Normal rate and regular rhythm.     Heart sounds: Normal heart sounds.  Pulmonary:     Breath sounds: Normal breath sounds.  Abdominal:     Palpations: Abdomen is soft.     Tenderness: There is no abdominal tenderness.  Genitourinary:    Comments: Deferred Musculoskeletal:     Cervical back: Normal range of motion.     Comments: Examination of the left hip reveals no skin wounds or lesions. Mild trochanteric tenderness to palpation. He has severely restricted range of motion of the left hip. Pain in the position of impingement. Pain with terminal flexion and rotation  Skin:    General: Skin is warm and dry.  Neurological:     Mental Status: He is alert and oriented to person, place, and time.  Psychiatric:        Mood and Affect: Mood normal.     Vital signs in last 24 hours: @VSRANGES@  Labs:   Estimated body mass index is 37.24 kg/m as calculated from the following:   Height as of 03/30/20: 5' 11" (1.803 m).   Weight as of 05/01/20: 121.1 kg.   Imaging Review Plain radiographs demonstrate severe degenerative joint disease of the left hip(s). The bone quality appears to be adequate for age and reported activity level.      Assessment/Plan:  End stage arthritis, left hip(s)  The patient history, physical examination, clinical judgement of the provider and imaging studies are consistent with end stage degenerative joint disease of the left  hip(s) and total hip arthroplasty is deemed medically necessary. The treatment options including medical management, injection therapy, arthroscopy and arthroplasty were discussed at length. The risks and benefits of total hip arthroplasty were presented and reviewed. The risks due to aseptic loosening, infection, stiffness, dislocation/subluxation,  thromboembolic complications and other imponderables were discussed.  The patient acknowledged the explanation, agreed to proceed with the plan and consent was signed. Patient is being admitted for inpatient treatment for surgery, pain control, PT, OT, prophylactic antibiotics, VTE prophylaxis, progressive ambulation and ADL's and discharge planning.The patient is planning to be discharged home with same day discharge    

## 2020-05-12 ENCOUNTER — Ambulatory Visit: Payer: 59 | Attending: Internal Medicine

## 2020-05-12 DIAGNOSIS — Z23 Encounter for immunization: Secondary | ICD-10-CM

## 2020-05-12 NOTE — Progress Notes (Signed)
   Covid-19 Vaccination Clinic  Name:  Michael [name]    MRN: [id number] DOB: [birth year]  05/12/2020  Michael [name] was observed post Covid-19 immunization for 15 minutes without incident. He was provided with Vaccine Information Sheet and instruction to access the V-Safe system.   Michael [name] was instructed to call 911 with any severe reactions post vaccine: [name] Difficulty breathing  . Swelling of face and throat  . A fast heartbeat  . A bad rash all over body  . Dizziness and weakness   Immunizations Administered    Name Date Dose VIS Date Route   Pfizer COVID-19 Vaccine 05/12/2020  1:55 PM 0.3 mL 03/15/2020 Intramuscular   Manufacturer: [employer], [employer]   Lot: [id number]   NDC: [address]-6

## 2020-05-12 NOTE — Progress Notes (Signed)
DUE TO COVID-19 ONLY ONE VISITOR IS ALLOWED TO COME WITH YOU AND STAY IN THE WAITING ROOM ONLY DURING PRE OP AND PROCEDURE DAY OF SURGERY. THE 1 VISITOR  MAY VISIT WITH YOU AFTER SURGERY IN YOUR PRIVATE ROOM DURING VISITING HOURS ONLY!  YOU NEED TO HAVE A COVID 19 TEST ON_______ @_______ , THIS TEST MUST BE DONE BEFORE SURGERY,  COVID TESTING SITE [address] [place] , IT IS ON THE RIGHT GOING OUT [address] APPROXIMATELY  2 MINUTES PAST [employer] ON THE RIGHT. ONCE YOUR COVID TEST IS COMPLETED,  PLEASE BEGIN THE QUARANTINE INSTRUCTIONS AS OUTLINED IN YOUR HANDOUT.                Michael [name]  05/12/2020   Your procedure is scheduled on: 05/17/2020    Report to [facility] Main  Entrance   Report to admitting at    0530 AM     Call this number if you have problems the morning of surgery [phone]    REMEMBER: NO  SOLID FOOD CANDY OR GUM AFTER MIDNIGHT. CLEAR LIQUIDS UNTIL 0430am         . NOTHING BY MOUTH EXCEPT CLEAR LIQUIDS UNTIL    . PLEASE FINISH ENSURE DRINK PER SURGEON ORDER  WHICH NEEDS TO BE COMPLETED AT      .0430am       CLEAR LIQUID DIET   Foods Allowed                                                                    Coffee and tea, regular and decaf                            Fruit ices (not with fruit pulp)                                      Iced Popsicles                                    Carbonated beverages, regular and diet                                    Cranberry, grape and apple juices Sports drinks like Gatorade Lightly seasoned clear broth or consume(fat free) Sugar, honey syrup ___________________________________________________________________      BRUSH YOUR TEETH MORNING OF SURGERY AND RINSE YOUR MOUTH OUT, NO CHEWING GUM CANDY OR MINTS.     Take these medicines the morning of surgery with A SIP OF WATER: amlodipine, hydralazine   DO NOT TAKE ANY DIABETIC MEDICATIONS DAY OF YOUR  SURGERY                               You may not have any metal on your body including hair pins and              piercings  Do not  wear jewelry, make-up, lotions, powders or perfumes, deodorant             Do not wear nail polish on your fingernails.  Do not shave  48 hours prior to surgery.              Men may shave face and neck.   Do not bring valuables to the hospital. [place].  Contacts, dentures or bridgework may not be worn into surgery.  Leave suitcase in the car. After surgery it may be brought to your room.     Patients discharged the day of surgery will not be allowed to drive home. IF YOU ARE HAVING SURGERY AND GOING HOME THE SAME DAY, YOU MUST HAVE AN ADULT TO DRIVE YOU HOME AND BE WITH YOU FOR 24 HOURS. YOU MAY GO HOME BY TAXI OR UBER OR ORTHERWISE, BUT AN ADULT MUST ACCOMPANY YOU HOME AND STAY WITH YOU FOR 24 HOURS.  Name and phone number of your driver:  Special Instructions: N/A              Please read over the following fact sheets you were given: _____________________________________________________________________  [facility] - Preparing for Surgery Before surgery, you can play an important role.  Because skin is not sterile, your skin needs to be as free of germs as possible.  You can reduce the number of germs on your skin by washing with CHG (chlorahexidine gluconate) soap before surgery.  CHG is an antiseptic cleaner which kills germs and bonds with the skin to continue killing germs even after washing. Please DO NOT use if you have an allergy to CHG or antibacterial soaps.  If your skin becomes reddened/irritated stop using the CHG and inform your nurse when you arrive at Short Stay. Do not shave (including legs and underarms) for at least 48 hours prior to the first CHG shower.  You may shave your face/neck. Please follow these instructions carefully:  1.  Shower with CHG Soap the night before surgery and the   morning of Surgery.  2.  If you choose to wash your hair, wash your hair first as usual with your  normal  shampoo.  3.  After you shampoo, rinse your hair and body thoroughly to remove the  shampoo.                           4.  Use CHG as you would any other liquid soap.  You can apply chg directly  to the skin and wash                       Gently with a scrungie or clean washcloth.  5.  Apply the CHG Soap to your body ONLY FROM THE NECK DOWN.   Do not use on face/ open                           Wound or open sores. Avoid contact with eyes, ears mouth and genitals (private parts).                       Wash face,  Genitals (private parts) with your normal soap.             6.  Wash thoroughly, paying  special attention to the area where your surgery  will be performed.  7.  Thoroughly rinse your body with warm water from the neck down.  8.  DO NOT shower/wash with your normal soap after using and rinsing off  the CHG Soap.                9.  Pat yourself dry with a clean towel.            10.  Wear clean pajamas.            11.  Place clean sheets on your bed the night of your first shower and do not  sleep with pets. Day of Surgery : Do not apply any lotions/deodorants the morning of surgery.  Please wear clean clothes to the hospital/surgery center.  FAILURE TO FOLLOW THESE INSTRUCTIONS MAY RESULT IN THE CANCELLATION OF YOUR SURGERY PATIENT SIGNATURE_________________________________  NURSE SIGNATURE__________________________________  ________________________________________________________________________

## 2020-05-13 ENCOUNTER — Other Ambulatory Visit (HOSPITAL_COMMUNITY)
Admission: RE | Admit: 2020-05-13 | Discharge: 2020-05-13 | Disposition: A | Discharge status: Home / Self Care | Payer: 59 | Source: Ambulatory Visit | Attending: Orthopedic Surgery | Admitting: Orthopedic Surgery

## 2020-05-13 DIAGNOSIS — Z20822 Contact with and (suspected) exposure to covid-19: Secondary | ICD-10-CM | POA: Diagnosis not present

## 2020-05-13 DIAGNOSIS — Z01812 Encounter for preprocedural laboratory examination: Secondary | ICD-10-CM | POA: Insufficient documentation

## 2020-05-13 LAB — SARS CORONAVIRUS 2 (TAT 6-24 HRS): SARS Coronavirus 2: NEGATIVE

## 2020-05-15 ENCOUNTER — Encounter (HOSPITAL_COMMUNITY)
Admission: RE | Admit: 2020-05-15 | Discharge: 2020-05-15 | Disposition: A | Discharge status: Home / Self Care | Payer: 59 | Source: Ambulatory Visit | Attending: Orthopedic Surgery | Admitting: Orthopedic Surgery

## 2020-05-15 ENCOUNTER — Encounter (HOSPITAL_COMMUNITY): Payer: Self-pay

## 2020-05-15 ENCOUNTER — Other Ambulatory Visit: Payer: Self-pay

## 2020-05-15 DIAGNOSIS — Z01812 Encounter for preprocedural laboratory examination: Secondary | ICD-10-CM | POA: Insufficient documentation

## 2020-05-15 LAB — URINALYSIS, ROUTINE W REFLEX MICROSCOPIC
Bilirubin Urine: NEGATIVE
Glucose, UA: NEGATIVE mg/dL
Hgb urine dipstick: NEGATIVE
Ketones, ur: NEGATIVE mg/dL
Leukocytes,Ua: NEGATIVE
Nitrite: NEGATIVE
Protein, ur: NEGATIVE mg/dL
Specific Gravity, Urine: 1.013 (ref 1.005–1.030)
pH: 7 (ref 5.0–8.0)

## 2020-05-15 LAB — COMPREHENSIVE METABOLIC PANEL
ALT: 24 U/L (ref 0–44)
AST: 20 U/L (ref 15–41)
Albumin: 4.2 g/dL (ref 3.5–5.0)
Alkaline Phosphatase: 81 U/L (ref 38–126)
Anion gap: 12 (ref 5–15)
BUN: 12 mg/dL (ref 6–20)
CO2: 26 mmol/L (ref 22–32)
Calcium: 9.5 mg/dL (ref 8.9–10.3)
Chloride: 97 mmol/L — ABNORMAL LOW (ref 98–111)
Creatinine, Ser: 0.7 mg/dL (ref 0.61–1.24)
GFR, Estimated: >60 mL/min (ref >60)
Glucose, Bld: 94 mg/dL (ref 70–99)
Potassium: 4 mmol/L (ref 3.5–5.1)
Sodium: 135 mmol/L (ref 135–145)
Total Bilirubin: 0.6 mg/dL (ref 0.3–1.2)
Total Protein: 7.9 g/dL (ref 6.5–8.1)

## 2020-05-15 LAB — SURGICAL PCR SCREEN
MRSA, PCR: NEGATIVE
Staphylococcus aureus: NEGATIVE

## 2020-05-15 LAB — CBC
HCT: 44.6 % (ref 39.0–52.0)
Hemoglobin: 15.1 g/dL (ref 13.0–17.0)
MCH: 30.3 pg (ref 26.0–34.0)
MCHC: 33.9 g/dL (ref 30.0–36.0)
MCV: 89.6 fL (ref 80.0–100.0)
Platelets: 289 10*3/uL (ref 150–400)
RBC: 4.98 MIL/uL (ref 4.22–5.81)
RDW: 12.7 % (ref 11.5–15.5)
WBC: 7.2 10*3/uL (ref 4.0–10.5)
nRBC: 0 % (ref 0.0–0.2)

## 2020-05-15 LAB — PROTIME-INR
INR: 1 (ref 0.8–1.2)
Prothrombin Time: 12.8 seconds (ref 11.4–15.2)

## 2020-05-15 NOTE — Progress Notes (Signed)
Anesthesia Review:  PCP: DR Rod Holler 05/01/2020 for preop eval  Cardiologist : Chest x-ray : EKG :12/6/20201  Echo : Stress test: Cardiac Cath :  Activity level: can do a flight of stairs without difficulty  Sleep Study/ CPAP : no  Fasting Blood Sugar :      / Checks Blood Sugar -- times a day:   Blood Thinner/ Instructions /Last Dose: ASA / Instructions/ Last Dose :  03/30/20- [id number]

## 2020-05-17 LAB — TYPE AND SCREEN
ABO/RH(D): B POS
Antibody Screen: NEGATIVE

## 2020-05-22 ENCOUNTER — Ambulatory Visit: Payer: Self-pay | Admitting: Student

## 2020-05-23 ENCOUNTER — Encounter (HOSPITAL_COMMUNITY): Admission: RE | Admit: 2020-05-23 | Payer: 59 | Source: Ambulatory Visit

## 2020-05-31 ENCOUNTER — Other Ambulatory Visit: Payer: Self-pay | Admitting: Internal Medicine

## 2020-05-31 DIAGNOSIS — F17200 Nicotine dependence, unspecified, uncomplicated: Secondary | ICD-10-CM

## 2020-05-31 NOTE — Patient Instructions (Addendum)
DUE TO COVID-19 ONLY ONE VISITOR IS ALLOWED TO COME WITH YOU AND STAY IN THE WAITING ROOM ONLY DURING PRE OP AND PROCEDURE.   IF YOU WILL BE ADMITTED INTO THE HOSPITAL YOU ARE ALLOWED ONE SUPPORT PERSON DURING VISITATION HOURS ONLY (10AM -8PM)   . The support person may change daily. . The support person must pass our screening, gel in and out, and wear a mask at all times, including in the patient's room. . Patients must also wear a mask when staff or their support person are in the room.   COVID SWAB TESTING MUST BE COMPLETED ON:  Monday, 06-05-20 @ 8:35 AM   [address] [place], [place] [address]  (Must self quarantine after testing. Follow instructions on handout.)        Your procedure is scheduled on: Thursday, 06-08-20   Report to [facility] Main  Entrance   Report to Short Stay at 5:30 AM   [facility])    Call this number if you have problems the morning of surgery [phone]   Do not eat food :After Midnight.   May have liquids until 4:30 AM  day of surgery  CLEAR LIQUID DIET  Foods Allowed                                                                     Foods Excluded  Water, Black Coffee and tea, regular and decaf             liquids that you cannot  Plain Jell-O in any flavor  (No red)                                   see through such as: Fruit ices (not with fruit pulp)                                      milk, soups, orange juice              Iced Popsicles (No red)                                      All solid food                                   Apple juices Sports drinks like Gatorade (No red) Lightly seasoned clear broth or consume(fat free) Sugar, honey syrup      Complete one Ensure drink the morning of surgery at 4:30 AM  the day of surgery.   Oral Hygiene is also important to reduce your risk of infection.                                    Remember - BRUSH YOUR TEETH THE MORNING OF SURGERY WITH YOUR REGULAR TOOTHPASTE   Do NOT  smoke after Midnight   Take  these medicines the morning of surgery with A SIP OF WATER:   Amlodipine, Hydralazine, Atorvastatin                                You may not have any metal on your body including jewelry, and body piercings             Do not wear  lotions, powders, perfumes/cologne, or deodorant             Men may shave face and neck.   Do not bring valuables to the hospital. [place] IS NOT  RESPONSIBLE   FOR VALUABLES.   Contacts, dentures or bridgework may not be worn into surgery.   Bring small overnight bag day of surgery.                 Please read over the following fact sheets you were given: IF YOU HAVE QUESTIONS ABOUT YOUR PRE OP INSTRUCTIONS PLEASE CALL [phone]   [place] - Preparing for Surgery Before surgery, you can play an important role.  Because skin is not sterile, your skin needs to be as free of germs as possible.  You can reduce the number of germs on your skin by washing with CHG (chlorahexidine gluconate) soap before surgery.  CHG is an antiseptic cleaner which kills germs and bonds with the skin to continue killing germs even after washing. Please DO NOT use if you have an allergy to CHG or antibacterial soaps.  If your skin becomes reddened/irritated stop using the CHG and inform your nurse when you arrive at Short Stay. Do not shave (including legs and underarms) for at least 48 hours prior to the first CHG shower.  You may shave your face/neck.  Please follow these instructions carefully:  1.  Shower with CHG Soap the night before surgery and the  morning of surgery.  2.  If you choose to wash your hair, wash your hair first as usual with your normal  shampoo.  3.  After you shampoo, rinse your hair and body thoroughly to remove the shampoo.                             4.  Use CHG as you would any other liquid soap.  You can apply chg directly to the skin and wash.  Gently with a scrungie or clean washcloth.  5.  Apply the CHG Soap to  your body ONLY FROM THE NECK DOWN.   Do   not use on face/ open                           Wound or open sores. Avoid contact with eyes, ears mouth and   genitals (private parts).                       Wash face,  Genitals (private parts) with your normal soap.             6.  Wash thoroughly, paying special attention to the area where your    surgery  will be performed.  7.  Thoroughly rinse your body with warm water from the neck down.  8.  DO NOT shower/wash with your normal soap after using and rinsing off the CHG Soap.  9.  Pat yourself dry with a clean towel.            10.  Wear clean pajamas.            11.  Place clean sheets on your bed the night of your first shower and do not  sleep with pets. Day of Surgery : Do not apply any lotions/deodorants the morning of surgery.  Please wear clean clothes to the hospital/surgery center.  FAILURE TO FOLLOW THESE INSTRUCTIONS MAY RESULT IN THE CANCELLATION OF YOUR SURGERY  PATIENT SIGNATURE_________________________________  NURSE SIGNATURE__________________________________  ________________________________________________________________________     Michael [name]  An incentive spirometer is a tool that can help keep your lungs clear and active. This tool measures how well you are filling your lungs with each breath. Taking long deep breaths may help reverse or decrease the chance of developing breathing (pulmonary) problems (especially infection) following:  A long period of time when you are unable to move or be active. BEFORE THE PROCEDURE   If the spirometer includes an indicator to show your best effort, your nurse or respiratory therapist will set it to a desired goal.  If possible, sit up straight or lean slightly forward. Try not to slouch.  Hold the incentive spirometer in an upright position. INSTRUCTIONS FOR USE  1. Sit on the edge of your bed if possible, or sit up as far as you can in bed or on a  chair. 2. Hold the incentive spirometer in an upright position. 3. Breathe out normally. 4. Place the mouthpiece in your mouth and seal your lips tightly around it. 5. Breathe in slowly and as deeply as possible, raising the piston or the ball toward the top of the column. 6. Hold your breath for 3-5 seconds or for as long as possible. Allow the piston or ball to fall to the bottom of the column. 7. Remove the mouthpiece from your mouth and breathe out normally. 8. Rest for a few seconds and repeat Steps 1 through 7 at least 10 times every 1-2 hours when you are awake. Take your time and take a few normal breaths between deep breaths. 9. The spirometer may include an indicator to show your best effort. Use the indicator as a goal to work toward during each repetition. 10. After each set of 10 deep breaths, practice coughing to be sure your lungs are clear. If you have an incision (the cut made at the time of surgery), support your incision when coughing by placing a pillow or rolled up towels firmly against it. Once you are able to get out of bed, walk around indoors and cough well. You may stop using the incentive spirometer when instructed by your caregiver.  RISKS AND COMPLICATIONS  Take your time so you do not get dizzy or light-headed.  If you are in pain, you may need to take or ask for pain medication before doing incentive spirometry. It is harder to take a deep breath if you are having pain. AFTER USE  Rest and breathe slowly and easily.  It can be helpful to keep track of a log of your progress. Your caregiver can provide you with a simple table to help with this. If you are using the spirometer at home, follow these instructions: [place] IF:   You are having difficultly using the spirometer.  You have trouble using the spirometer as often as instructed.  Your pain medication is not giving enough relief while using the spirometer.  You develop fever of 100.5 F  (38.1 C) or higher. SEEK IMMEDIATE MEDICAL CARE IF:   You cough up bloody sputum that had not been present before.  You develop fever of 102 F (38.9 C) or greater.  You develop worsening pain at or near the incision site. MAKE SURE YOU:   Understand these instructions.  Will watch your condition.  Will get help right away if you are not doing well or get worse. Document Released: 09/23/2006 Document Revised: 08/05/2011 Document Reviewed: 11/24/2006 Exit[employer] Patient Information 2014 Exit[employer], [place].   ________________________________________________________________________    WHAT IS A BLOOD TRANSFUSION? Blood Transfusion Information  A transfusion is the replacement of blood or some of its parts. Blood is made up of multiple cells which provide different functions.  Red blood cells carry oxygen and are used for blood loss replacement.  White blood cells fight against infection.  Platelets control bleeding.  Plasma helps clot blood.  Other blood products are available for specialized needs, such as hemophilia or other clotting disorders. BEFORE THE TRANSFUSION  Who gives blood for transfusions?   Healthy volunteers who are fully evaluated to make sure their blood is safe. This is blood bank blood. Transfusion therapy is the safest it has ever been in the practice of medicine. Before blood is taken from a donor, a complete history is taken to make sure that person has no history of diseases nor engages in risky social behavior (examples are intravenous drug use or sexual activity with multiple partners). The donor's travel history is screened to minimize risk of transmitting infections, such as malaria. The donated blood is tested for signs of infectious diseases, such as HIV and hepatitis. The blood is then tested to be sure it is compatible with you in order to minimize the chance of a transfusion reaction. If you or a relative donates blood, this is often done in  anticipation of surgery and is not appropriate for emergency situations. It takes many days to process the donated blood. RISKS AND COMPLICATIONS Although transfusion therapy is very safe and saves many lives, the main dangers of transfusion include:   Getting an infectious disease.  Developing a transfusion reaction. This is an allergic reaction to something in the blood you were given. Every precaution is taken to prevent this. The decision to have a blood transfusion has been considered carefully by your caregiver before blood is given. Blood is not given unless the benefits outweigh the risks. AFTER THE TRANSFUSION  Right after receiving a blood transfusion, you will usually feel much better and more energetic. This is especially true if your red blood cells have gotten low (anemic). The transfusion raises the level of the red blood cells which carry oxygen, and this usually causes an energy increase.  The nurse administering the transfusion will monitor you carefully for complications. HOME CARE INSTRUCTIONS  No special instructions are needed after a transfusion. You may find your energy is better. Speak with your caregiver about any limitations on activity for underlying diseases you may have. SEEK MEDICAL CARE IF:   Your condition is not improving after your transfusion.  You develop redness or irritation at the intravenous (IV) site. SEEK IMMEDIATE MEDICAL CARE IF:  Any of the following symptoms occur over the next 12 hours:  Shaking chills.  You have a temperature by mouth above 102 F (38.9 C), not controlled by medicine.  Chest, back, or muscle pain.  People around you feel you are not acting correctly or are confused.  Shortness of breath or difficulty breathing.  Dizziness and fainting.  You get a rash or develop hives.  You have a decrease in urine output.  Your urine turns a dark color or changes to pink, red, or brown. Any of the following symptoms occur over  the next 10 days:  You have a temperature by mouth above 102 F (38.9 C), not controlled by medicine.  Shortness of breath.  Weakness after normal activity.  The white part of the eye turns yellow (jaundice).  You have a decrease in the amount of urine or are urinating less often.  Your urine turns a dark color or changes to pink, red, or brown. Document Released: 05/10/2000 Document Revised: 08/05/2011 Document Reviewed: 12/28/2007 [facility] Patient Information 2014 [place], [place].  _______________________________________________________________________

## 2020-05-31 NOTE — Progress Notes (Addendum)
COVID Vaccine Completed: x3 Date COVID Vaccine completed:  05-12-20 Booster COVID vaccine manufacturer: Pfizer      PCP - Jonah Blue, MD Cardiologist - N/A  Chest x-ray - N/A EKG - 05-01-20 in Epic Stress Test -  ECHO -  Cardiac Cath -  Pacemaker/ICD device last checked:  Sleep Study - N/A CPAP -   Fasting Blood Sugar - N/A Checks Blood Sugar _____ times a day  Blood Thinner Instructions:  N/A Aspirin Instructions: Last Dose:  Anesthesia review:   Patient denies shortness of breath, fever, cough and chest pain at PAT appointment.  Pt has difficulty climbing stairs due to knee and hip pain, prior to this was able to do without difficulty.  Able to perform all ADLs indenpendently.   Patient verbalized understanding of instructions that were given to them at the PAT appointment. Patient was also instructed that they will need to review over the PAT instructions again at home before surgery.

## 2020-06-01 ENCOUNTER — Encounter (HOSPITAL_COMMUNITY)
Admission: RE | Admit: 2020-06-01 | Discharge: 2020-06-01 | Disposition: A | Discharge status: Home / Self Care | Payer: 59 | Source: Ambulatory Visit | Attending: Orthopedic Surgery | Admitting: Orthopedic Surgery

## 2020-06-01 ENCOUNTER — Other Ambulatory Visit: Payer: Self-pay

## 2020-06-01 ENCOUNTER — Encounter (HOSPITAL_COMMUNITY): Payer: Self-pay

## 2020-06-01 DIAGNOSIS — Z01812 Encounter for preprocedural laboratory examination: Secondary | ICD-10-CM | POA: Insufficient documentation

## 2020-06-01 HISTORY — DX: Prediabetes: R73.03

## 2020-06-01 LAB — COMPREHENSIVE METABOLIC PANEL
ALT: 22 U/L (ref 0–44)
AST: 23 U/L (ref 15–41)
Albumin: 4.6 g/dL (ref 3.5–5.0)
Alkaline Phosphatase: 76 U/L (ref 38–126)
Anion gap: 10 (ref 5–15)
BUN: 21 mg/dL — ABNORMAL HIGH (ref 6–20)
CO2: 28 mmol/L (ref 22–32)
Calcium: 9.6 mg/dL (ref 8.9–10.3)
Chloride: 100 mmol/L (ref 98–111)
Creatinine, Ser: 0.9 mg/dL (ref 0.61–1.24)
GFR, Estimated: >60 mL/min (ref >60)
Glucose, Bld: 75 mg/dL (ref 70–99)
Potassium: 4.5 mmol/L (ref 3.5–5.1)
Sodium: 138 mmol/L (ref 135–145)
Total Bilirubin: 0.6 mg/dL (ref 0.3–1.2)
Total Protein: 7.9 g/dL (ref 6.5–8.1)

## 2020-06-01 LAB — CBC
HCT: 42.9 % (ref 39.0–52.0)
Hemoglobin: 14.5 g/dL (ref 13.0–17.0)
MCH: 30.4 pg (ref 26.0–34.0)
MCHC: 33.8 g/dL (ref 30.0–36.0)
MCV: 89.9 fL (ref 80.0–100.0)
Platelets: 299 10*3/uL (ref 150–400)
RBC: 4.77 MIL/uL (ref 4.22–5.81)
RDW: 12.9 % (ref 11.5–15.5)
WBC: 8.1 10*3/uL (ref 4.0–10.5)
nRBC: 0 % (ref 0.0–0.2)

## 2020-06-01 LAB — HEMOGLOBIN A1C
Hgb A1c MFr Bld: 5.6 % (ref 4.8–5.6)
Mean Plasma Glucose: 114.02 mg/dL

## 2020-06-01 LAB — SURGICAL PCR SCREEN
MRSA, PCR: NEGATIVE
Staphylococcus aureus: NEGATIVE

## 2020-06-05 ENCOUNTER — Other Ambulatory Visit (HOSPITAL_COMMUNITY)
Admission: RE | Admit: 2020-06-05 | Discharge: 2020-06-05 | Disposition: A | Discharge status: Home / Self Care | Payer: 59 | Source: Ambulatory Visit | Attending: Orthopedic Surgery | Admitting: Orthopedic Surgery

## 2020-06-05 DIAGNOSIS — Z20822 Contact with and (suspected) exposure to covid-19: Secondary | ICD-10-CM | POA: Insufficient documentation

## 2020-06-05 DIAGNOSIS — Z01812 Encounter for preprocedural laboratory examination: Secondary | ICD-10-CM | POA: Insufficient documentation

## 2020-06-05 LAB — SARS CORONAVIRUS 2 (TAT 6-24 HRS): SARS Coronavirus 2: NEGATIVE

## 2020-06-08 ENCOUNTER — Encounter (HOSPITAL_COMMUNITY)
Admission: RE | Disposition: A | Discharge status: Home / Self Care | Payer: Self-pay | Source: Home / Self Care | Attending: Orthopedic Surgery

## 2020-06-08 ENCOUNTER — Encounter (HOSPITAL_COMMUNITY): Payer: Self-pay | Admitting: Orthopedic Surgery

## 2020-06-08 ENCOUNTER — Ambulatory Visit (HOSPITAL_COMMUNITY)
Admission: RE | Admit: 2020-06-08 | Discharge: 2020-06-08 | Disposition: A | Discharge status: Home / Self Care | Payer: 59 | Attending: Orthopedic Surgery | Admitting: Orthopedic Surgery

## 2020-06-08 ENCOUNTER — Ambulatory Visit (HOSPITAL_COMMUNITY): Payer: 59

## 2020-06-08 ENCOUNTER — Ambulatory Visit (HOSPITAL_COMMUNITY): Payer: 59 | Admitting: Certified Registered"

## 2020-06-08 ENCOUNTER — Ambulatory Visit (HOSPITAL_COMMUNITY): Payer: 59 | Admitting: Physician Assistant

## 2020-06-08 DIAGNOSIS — Z09 Encounter for follow-up examination after completed treatment for conditions other than malignant neoplasm: Secondary | ICD-10-CM

## 2020-06-08 DIAGNOSIS — Z888 Allergy status to other drugs, medicaments and biological substances status: Secondary | ICD-10-CM | POA: Insufficient documentation

## 2020-06-08 DIAGNOSIS — Z79899 Other long term (current) drug therapy: Secondary | ICD-10-CM | POA: Diagnosis not present

## 2020-06-08 DIAGNOSIS — M1612 Unilateral primary osteoarthritis, left hip: Secondary | ICD-10-CM | POA: Diagnosis present

## 2020-06-08 DIAGNOSIS — Z419 Encounter for procedure for purposes other than remedying health state, unspecified: Secondary | ICD-10-CM

## 2020-06-08 DIAGNOSIS — F17210 Nicotine dependence, cigarettes, uncomplicated: Secondary | ICD-10-CM | POA: Diagnosis not present

## 2020-06-08 HISTORY — PX: TOTAL HIP ARTHROPLASTY: SHX124

## 2020-06-08 LAB — TYPE AND SCREEN
ABO/RH(D): B POS
Antibody Screen: NEGATIVE

## 2020-06-08 SURGERY — ARTHROPLASTY, HIP, TOTAL, ANTERIOR APPROACH
Anesthesia: Spinal | Site: Hip | Laterality: Left

## 2020-06-08 MED ORDER — PROPOFOL 500 MG/50ML IV EMUL
INTRAVENOUS | Status: DC | PRN
  Administered 2020-06-08: 100 ug/kg/min via INTRAVENOUS

## 2020-06-08 MED ORDER — LACTATED RINGERS IV BOLUS
500.0000 mL | Freq: Once | INTRAVENOUS | Status: AC
  Administered 2020-06-08: 500 mL via INTRAVENOUS

## 2020-06-08 MED ORDER — 0.9 % SODIUM CHLORIDE (POUR BTL) OPTIME
TOPICAL | Status: DC | PRN
  Administered 2020-06-08: 1000 mL

## 2020-06-08 MED ORDER — DEXAMETHASONE SODIUM PHOSPHATE 10 MG/ML IJ SOLN
INTRAMUSCULAR | Status: DC | PRN
  Administered 2020-06-08: 10 mg via INTRAVENOUS

## 2020-06-08 MED ORDER — LACTATED RINGERS IV BOLUS: 250.0000 mL | Freq: Once | INTRAVENOUS | Status: DC

## 2020-06-08 MED ORDER — POVIDONE-IODINE 10 % EX SWAB
2.0000 "application " | Freq: Once | CUTANEOUS | Status: AC
  Administered 2020-06-08: 2 via TOPICAL

## 2020-06-08 MED ORDER — METHOCARBAMOL 500 MG IVPB - SIMPLE MED: 500.0000 mg | Freq: Four times a day (QID) | INTRAVENOUS | Status: DC | PRN

## 2020-06-08 MED ORDER — OXYCODONE HCL 5 MG PO TABS: 5.0000 mg | ORAL_TABLET | Freq: Once | ORAL | Status: DC | PRN

## 2020-06-08 MED ORDER — PROPOFOL 10 MG/ML IV BOLUS
INTRAVENOUS | Status: AC
  Filled 2020-06-08: qty 20

## 2020-06-08 MED ORDER — OXYCODONE HCL 5 MG/5ML PO SOLN: 5.0000 mg | Freq: Once | ORAL | Status: DC | PRN

## 2020-06-08 MED ORDER — CHLORHEXIDINE GLUCONATE 0.12 % MT SOLN
15.0000 mL | Freq: Once | OROMUCOSAL | Status: AC
  Administered 2020-06-08: 15 mL via OROMUCOSAL

## 2020-06-08 MED ORDER — FENTANYL CITRATE (PF) 100 MCG/2ML IJ SOLN
INTRAMUSCULAR | Status: AC
  Filled 2020-06-08: qty 2

## 2020-06-08 MED ORDER — ONDANSETRON HCL 4 MG PO TABS: 4.0000 mg | ORAL_TABLET | Freq: Three times a day (TID) | ORAL | 0 refills | Status: DC | PRN

## 2020-06-08 MED ORDER — MIDAZOLAM HCL 2 MG/2ML IJ SOLN
INTRAMUSCULAR | Status: DC | PRN
  Administered 2020-06-08: 2 mg via INTRAVENOUS

## 2020-06-08 MED ORDER — KETOROLAC TROMETHAMINE 30 MG/ML IJ SOLN
INTRAMUSCULAR | Status: AC
  Filled 2020-06-08: qty 1

## 2020-06-08 MED ORDER — LACTATED RINGERS IV SOLN: INTRAVENOUS | Status: DC

## 2020-06-08 MED ORDER — CEFAZOLIN SODIUM-DEXTROSE 2-4 GM/100ML-% IV SOLN: 2.0000 g | Freq: Four times a day (QID) | INTRAVENOUS | Status: DC

## 2020-06-08 MED ORDER — ISOPROPYL ALCOHOL 70 % SOLN
Status: AC
  Filled 2020-06-08: qty 480

## 2020-06-08 MED ORDER — SODIUM CHLORIDE 0.9 % IV SOLN: INTRAVENOUS | Status: DC

## 2020-06-08 MED ORDER — KETOROLAC TROMETHAMINE 30 MG/ML IJ SOLN
INTRAMUSCULAR | Status: DC | PRN
  Administered 2020-06-08: 30 mg

## 2020-06-08 MED ORDER — DOCUSATE SODIUM 100 MG PO CAPS: 100.0000 mg | ORAL_CAPSULE | Freq: Two times a day (BID) | ORAL | 1 refills | Status: DC

## 2020-06-08 MED ORDER — TRANEXAMIC ACID-NACL 1000-0.7 MG/100ML-% IV SOLN
1000.0000 mg | INTRAVENOUS | Status: AC
  Administered 2020-06-08: 1000 mg via INTRAVENOUS
  Filled 2020-06-08: qty 100

## 2020-06-08 MED ORDER — SODIUM CHLORIDE (PF) 0.9 % IJ SOLN
INTRAMUSCULAR | Status: AC
  Filled 2020-06-08: qty 30

## 2020-06-08 MED ORDER — LACTATED RINGERS IV BOLUS
250.0000 mL | Freq: Once | INTRAVENOUS | Status: AC
  Administered 2020-06-08: 250 mL via INTRAVENOUS

## 2020-06-08 MED ORDER — HYDROCODONE-ACETAMINOPHEN 5-325 MG PO TABS: 1.0000 | ORAL_TABLET | ORAL | 0 refills | Status: DC | PRN

## 2020-06-08 MED ORDER — SENNA 8.6 MG PO TABS: 2.0000 | ORAL_TABLET | Freq: Every day | ORAL | 1 refills | Status: DC

## 2020-06-08 MED ORDER — BUPIVACAINE-EPINEPHRINE 0.25% -1:200000 IJ SOLN
INTRAMUSCULAR | Status: DC | PRN
  Administered 2020-06-08: 30 mL

## 2020-06-08 MED ORDER — POVIDONE-IODINE 10 % EX SWAB: 2.0000 "application " | Freq: Once | CUTANEOUS | Status: DC

## 2020-06-08 MED ORDER — BUPIVACAINE HCL (PF) 0.75 % IJ SOLN
INTRAMUSCULAR | Status: DC | PRN
  Administered 2020-06-08: 1.6 mL via INTRATHECAL

## 2020-06-08 MED ORDER — BUPIVACAINE-EPINEPHRINE (PF) 0.25% -1:200000 IJ SOLN
INTRAMUSCULAR | Status: AC
  Filled 2020-06-08: qty 30

## 2020-06-08 MED ORDER — ISOPROPYL ALCOHOL 70 % SOLN
Status: DC | PRN
  Administered 2020-06-08: 1 via TOPICAL

## 2020-06-08 MED ORDER — ACETAMINOPHEN 10 MG/ML IV SOLN
1000.0000 mg | Freq: Once | INTRAVENOUS | Status: AC
  Administered 2020-06-08: 1000 mg via INTRAVENOUS
  Filled 2020-06-08: qty 100

## 2020-06-08 MED ORDER — SODIUM CHLORIDE (PF) 0.9 % IJ SOLN
INTRAMUSCULAR | Status: DC | PRN
  Administered 2020-06-08: 30 mL

## 2020-06-08 MED ORDER — CEFAZOLIN SODIUM-DEXTROSE 2-4 GM/100ML-% IV SOLN
2.0000 g | INTRAVENOUS | Status: AC
  Administered 2020-06-08: 2000 mg via INTRAVENOUS
  Filled 2020-06-08: qty 100

## 2020-06-08 MED ORDER — WATER FOR IRRIGATION, STERILE IR SOLN
Status: DC | PRN
  Administered 2020-06-08: 1000 mL

## 2020-06-08 MED ORDER — ASPIRIN 81 MG PO CHEW: 81.0000 mg | CHEWABLE_TABLET | Freq: Two times a day (BID) | ORAL | 0 refills | Status: AC

## 2020-06-08 MED ORDER — PROMETHAZINE HCL 25 MG/ML IJ SOLN: 6.2500 mg | INTRAMUSCULAR | Status: DC | PRN

## 2020-06-08 MED ORDER — METHOCARBAMOL 500 MG PO TABS: 500.0000 mg | ORAL_TABLET | Freq: Four times a day (QID) | ORAL | Status: DC | PRN

## 2020-06-08 MED ORDER — SODIUM CHLORIDE 0.9 % IR SOLN
Status: DC | PRN
  Administered 2020-06-08: 1000 mL

## 2020-06-08 MED ORDER — HYDROMORPHONE HCL 1 MG/ML IJ SOLN: 0.2500 mg | INTRAMUSCULAR | Status: DC | PRN

## 2020-06-08 MED ORDER — ORAL CARE MOUTH RINSE: 15.0000 mL | Freq: Once | OROMUCOSAL | Status: AC

## 2020-06-08 MED ORDER — PHENYLEPHRINE HCL-NACL 20-0.9 MG/250ML-% IV SOLN
INTRAVENOUS | Status: DC | PRN
  Administered 2020-06-08: 25 ug/min via INTRAVENOUS

## 2020-06-08 MED ORDER — MIDAZOLAM HCL 2 MG/2ML IJ SOLN
INTRAMUSCULAR | Status: AC
  Filled 2020-06-08: qty 2

## 2020-06-08 SURGICAL SUPPLY — 66 items
ACETAB CUP W/GRIPTION 54 (Plate) ×2 IMPLANT
ADH SKN CLS APL DERMABOND .7 (GAUZE/BANDAGES/DRESSINGS) ×1
APL PRP STRL LF DISP 70% ISPRP (MISCELLANEOUS) ×1
BAG DECANTER FOR FLEXI CONT (MISCELLANEOUS) IMPLANT
BAG SPEC THK2 15X12 ZIP CLS (MISCELLANEOUS)
BAG ZIPLOCK 12X15 (MISCELLANEOUS) IMPLANT
BLADE SURG SZ10 CARB STEEL (BLADE) IMPLANT
CHLORAPREP W/TINT 26 (MISCELLANEOUS) ×2 IMPLANT
COVER PERINEAL POST (MISCELLANEOUS) ×2 IMPLANT
COVER SURGICAL LIGHT HANDLE (MISCELLANEOUS) ×2 IMPLANT
COVER WAND RF STERILE (DRAPES) IMPLANT
CUP ACETAB W/GRIPTION 54 (Plate) IMPLANT
DECANTER SPIKE VIAL GLASS SM (MISCELLANEOUS) ×2 IMPLANT
DERMABOND ADVANCED (GAUZE/BANDAGES/DRESSINGS) ×1
DERMABOND ADVANCED .7 DNX12 (GAUZE/BANDAGES/DRESSINGS) ×2 IMPLANT
DRAPE IMP U-DRAPE 54X76 (DRAPES) ×2 IMPLANT
DRAPE SHEET LG 3/4 BI-LAMINATE (DRAPES) ×6 IMPLANT
DRAPE STERI IOBAN 125X83 (DRAPES) IMPLANT
DRAPE U-SHAPE 47X51 STRL (DRAPES) ×4 IMPLANT
DRSG AQUACEL AG ADV 3.5X10 (GAUZE/BANDAGES/DRESSINGS) ×2 IMPLANT
DRSG AQUACEL AG ADV 3.5X14 (GAUZE/BANDAGES/DRESSINGS) ×1 IMPLANT
ELECT REM PT RETURN 15FT ADLT (MISCELLANEOUS) ×2 IMPLANT
GAUZE SPONGE 4X4 12PLY STRL (GAUZE/BANDAGES/DRESSINGS) ×2 IMPLANT
GLOVE BIO SURGEON STRL SZ8.5 (GLOVE) ×4 IMPLANT
GLOVE BIOGEL PI IND STRL 8.5 (GLOVE) ×1 IMPLANT
GLOVE BIOGEL PI INDICATOR 8.5 (GLOVE) ×1
GLOVE SRG 8 PF TXTR STRL LF DI (GLOVE) ×1 IMPLANT
GLOVE SURG ENC TEXT LTX SZ7.5 (GLOVE) ×4 IMPLANT
GLOVE SURG UNDER POLY LF SZ8 (GLOVE) ×2
GOWN SPEC L3 XXLG W/TWL (GOWN DISPOSABLE) ×2 IMPLANT
GOWN STRL REUS W/ TWL LRG LVL3 (GOWN DISPOSABLE) ×1 IMPLANT
GOWN STRL REUS W/TWL LRG LVL3 (GOWN DISPOSABLE) ×2
HANDPIECE INTERPULSE COAX TIP (DISPOSABLE) ×2
HEAD CERAMIC 36 PLUS5 (Hips) ×1 IMPLANT
HOLDER FOLEY CATH W/STRAP (MISCELLANEOUS) ×2 IMPLANT
HOOD PEEL AWAY FLYTE STAYCOOL (MISCELLANEOUS) ×8 IMPLANT
JET LAVAGE IRRISEPT WOUND (IRRIGATION / IRRIGATOR) ×2
KIT TURNOVER KIT A (KITS) IMPLANT
LAVAGE JET IRRISEPT WOUND (IRRIGATION / IRRIGATOR) ×1 IMPLANT
LINER NEUTRAL 36ID 54OD (Liner) ×1 IMPLANT
MANIFOLD NEPTUNE II (INSTRUMENTS) ×2 IMPLANT
MARKER SKIN DUAL TIP RULER LAB (MISCELLANEOUS) ×2 IMPLANT
NDL SAFETY ECLIPSE 18X1.5 (NEEDLE) ×1 IMPLANT
NDL SPNL 18GX3.5 QUINCKE PK (NEEDLE) ×1 IMPLANT
NEEDLE HYPO 18GX1.5 SHARP (NEEDLE) ×2
NEEDLE SPNL 18GX3.5 QUINCKE PK (NEEDLE) ×2 IMPLANT
PACK ANTERIOR HIP CUSTOM (KITS) ×2 IMPLANT
PENCIL SMOKE EVACUATOR (MISCELLANEOUS) IMPLANT
SAW OSC TIP CART 19.5X105X1.3 (SAW) ×2 IMPLANT
SEALER BIPOLAR AQUA 6.0 (INSTRUMENTS) ×2 IMPLANT
SET HNDPC FAN SPRY TIP SCT (DISPOSABLE) ×1 IMPLANT
STEM TRI LOC BPS SZ7 W GRIPTON (Hips) IMPLANT
SUT ETHIBOND NAB CT1 #1 30IN (SUTURE) ×4 IMPLANT
SUT MNCRL AB 3-0 PS2 18 (SUTURE) ×2 IMPLANT
SUT MNCRL AB 4-0 PS2 18 (SUTURE) ×2 IMPLANT
SUT MON AB 2-0 CT1 36 (SUTURE) ×4 IMPLANT
SUT STRATAFIX PDO 1 14 VIOLET (SUTURE) ×2
SUT STRATFX PDO 1 14 VIOLET (SUTURE) ×1
SUT VIC AB 2-0 CT1 27 (SUTURE) ×2
SUT VIC AB 2-0 CT1 TAPERPNT 27 (SUTURE) ×1 IMPLANT
SUTURE STRATFX PDO 1 14 VIOLET (SUTURE) ×1 IMPLANT
SYR 3ML LL SCALE MARK (SYRINGE) ×2 IMPLANT
TRAY FOLEY MTR SLVR 16FR STAT (SET/KITS/TRAYS/PACK) IMPLANT
TRI LOC BPS SZ 7 W GRIPTON (Hips) ×2 IMPLANT
TUBE SUCTION HIGH CAP CLEAR NV (SUCTIONS) ×2 IMPLANT
WATER STERILE IRR 1000ML POUR (IV SOLUTION) ×2 IMPLANT

## 2020-06-08 NOTE — Addendum Note (Signed)
Addendum  created 06/08/20 1141 by Donna Bernard, CRNA   Intraprocedure Meds edited

## 2020-06-08 NOTE — Evaluation (Signed)
Physical Therapy Evaluation Patient Details Name: ODEL SCHMID MRN: [id number] DOB: 1960/10/11 Today's Date: 06/08/2020   History of Present Illness  Patient is 60 y.o. male s/p Lt THA anterior approach on 06/08/20 with PMH significant for HTN, hep C, OA, Lt TKA s/p 2 revisions in 2020, Lt RCR.    Clinical Impression  MARDY LUCIER is a 60 y.o. male POD 0 s/p Lt THA. Patient reports independence with mobility at baseline. Patient is now limited by functional impairments (see PT problem list below) and requires min guard/supervision for transfers and gait with RW. Patient was able to ambulate ~140 feet with RW and supervision and cues for safe walker management. Patient educated on safe sequencing for stair mobility and verbalized safe guarding position for people assisting with mobility. Patient instructed in exercises to facilitate ROM and circulation. Patient will benefit from continued skilled PT interventions to address impairments and progress towards PLOF. Patient has met mobility goals at adequate level for discharge home; will continue to follow if pt continues acute stay to progress towards Mod I goals.     Follow Up Recommendations Follow surgeon's recommendation for DC plan and follow-up therapies    Equipment Recommendations  None recommended by PT    Recommendations for Other Services       Precautions / Restrictions Precautions Precautions: Fall Restrictions Weight Bearing Restrictions: No Other Position/Activity Restrictions: WBAT      Mobility  Bed Mobility Overal bed mobility: Needs Assistance Bed Mobility: Supine to Sit     Supine to sit: Supervision     General bed mobility comments: no assist needed, pt takign some incresaed time.    Transfers Overall transfer level: Needs assistance Equipment used: Rolling walker (2 wheeled) Transfers: Sit to/from Stand Sit to Stand: Min guard;Supervision         General transfer comment: cues for safe  hand placment with RW, no assist to rise an dpt steady once standing.  Ambulation/Gait Ambulation/Gait assistance: Min guard;Supervision Gait Distance (Feet): 140 Feet Assistive device: Rolling walker (2 wheeled) Gait Pattern/deviations: Step-to pattern;Step-through pattern;Decreased stride length Gait velocity: fair   General Gait Details: cues for step to pattern and pt eventaully prgoresed to step through. no overt LOB noted and pt maintained safe proximityi to RW.  Stairs Stairs: Yes Stairs assistance: Min guard Stair Management: No rails;Step to pattern;Forwards;With walker Number of Stairs: 2 General stair comments: VC's for sequencing step up with RW, "up with good, down with bad". educated on safe guarding position for family/friends to provide on stairs.  Wheelchair Mobility    Modified Rankin (Stroke Patients Only)       Balance Overall balance assessment: Mild deficits observed, not formally tested                                           Pertinent Vitals/Pain Pain Assessment: 0-10 Pain Score: 0-No pain Pain Intervention(s): Monitored during session    [place] expects to be discharged to:: Private residence Living Arrangements: Non-relatives/Friends Available Help at Discharge: Friend(s) Type of Home: House Home Access: Stairs to enter Entrance Stairs-Rails: None Entrance Stairs-Number of Steps: 1 Home Layout: One level Home Equipment: Environmental consultant - 2 wheels;Cane - single point;Bedside commode;Shower seat Additional Comments: girlfriend will be available to help 24/7 at home    Prior Function Level of Independence: Independent  Hand Dominance        Extremity/Trunk Assessment   Upper Extremity Assessment Upper Extremity Assessment: Overall WFL for tasks assessed    Lower Extremity Assessment Lower Extremity Assessment: Overall WFL for tasks assessed;LLE deficits/detail LLE Deficits / Details:  good quad activation and 4/5 for ankle dorsi/plantar flexion LLE Sensation: WNL LLE Coordination: WNL    Cervical / Trunk Assessment Cervical / Trunk Assessment: Normal  Communication   Communication: No difficulties  Cognition Arousal/Alertness: Awake/alert Behavior During Therapy: WFL for tasks assessed/performed Overall Cognitive Status: Within Functional Limits for tasks assessed                                        General Comments      Exercises Total Joint Exercises Ankle Circles/Pumps: AROM;Both;10 reps;Seated Quad Sets: AROM;5 reps;Seated;Both Short Arc Quad: Left;AROM;Seated (3) Heel Slides: Left;AROM;Seated (3) Hip ABduction/ADduction: Left;AROM;Seated (3) Long Arc Quad: Left;AROM;Seated (3)   Assessment/Plan    PT Assessment Patient needs continued PT services  PT Problem List Decreased strength;Decreased range of motion;Decreased activity tolerance;Decreased balance;Decreased mobility;Decreased knowledge of use of DME;Decreased knowledge of precautions       PT Treatment Interventions DME instruction;Gait training;Stair training;Functional mobility training;Therapeutic activities;Therapeutic exercise;Balance training;Patient/family education    PT Goals (Current goals can be found in the Care Plan section)  Acute Rehab PT Goals Patient Stated Goal: get home and recover from hip to work on knee next PT Goal Formulation: With patient Time For Goal Achievement: 06/15/20 Potential to Achieve Goals: Good    Frequency 7X/week   Barriers to discharge        Co-evaluation               AM-PAC PT "6 Clicks" Mobility  Outcome Measure Help needed turning from your back to your side while in a flat bed without using bedrails?: None Help needed moving from lying on your back to sitting on the side of a flat bed without using bedrails?: None Help needed moving to and from a bed to a chair (including a wheelchair)?: A Little Help needed  standing up from a chair using your arms (e.g., wheelchair or bedside chair)?: A Little Help needed to walk in hospital room?: A Little Help needed climbing 3-5 steps with a railing? : A Little 6 Click Score: 20    End of Session Equipment Utilized During Treatment: Gait belt Activity Tolerance: Patient tolerated treatment well Patient left: in chair;with call bell/phone within reach Nurse Communication: Mobility status PT Visit Diagnosis: Muscle weakness (generalized) (M62.81);Difficulty in walking, not elsewhere classified (R26.2)    Time: 1130-1200 PT Time Calculation (min) (ACUTE ONLY): 30 min   Charges:   PT Evaluation $PT Eval Low Complexity: 1 Low PT Treatments $Gait Training: 8-22 mins        Verner Mould, DPT Acute Rehabilitation Services Office [phone] Pager [phone]    Jacques Navy 06/08/2020, 1:39 PM

## 2020-06-08 NOTE — Anesthesia Postprocedure Evaluation (Signed)
Anesthesia Post Note  Patient: Michael [name]  Procedure(s) Performed: TOTAL HIP ARTHROPLASTY ANTERIOR APPROACH (Left Hip)     Patient location during evaluation: PACU Anesthesia Type: Spinal Level of consciousness: awake and alert Pain management: pain level controlled Vital Signs Assessment: post-procedure vital signs reviewed and stable Respiratory status: spontaneous breathing, nonlabored ventilation and respiratory function stable Cardiovascular status: blood pressure returned to baseline and stable Postop Assessment: no apparent nausea or vomiting Anesthetic complications: no   No complications documented.  Last Vitals:  Vitals:   06/08/20 1100 06/08/20 1115  BP: (!) 145/92 (!) 141/84  Pulse: 60 62  Resp: 13 14  Temp:  36.5 C  SpO2: 100% 100%    Last Pain:  Vitals:   06/08/20 1115  TempSrc:   PainSc: 0-No pain                 Lowella Curb

## 2020-06-08 NOTE — Discharge Instructions (Signed)
°Dr. Sevrin Sally °Joint Replacement Specialist °[facility] °[address] °[place], [place] [address] °[phone] ° ° °TOTAL HIP REPLACEMENT POSTOPERATIVE DIRECTIONS ° ° ° °Hip Rehabilitation, Guidelines Following Surgery  ° °WEIGHT BEARING °Weight bearing as tolerated with assist device (walker, cane, etc) as directed, use it as long as suggested by your surgeon or therapist, typically at least 4-6 weeks. ° °The results of a hip operation are greatly improved after range of motion and muscle strengthening exercises. Follow all safety measures which are given to protect your hip. If any of these exercises cause increased pain or swelling in your joint, decrease the amount until you are comfortable again. Then slowly increase the exercises. Call your caregiver if you have problems or questions.  ° °HOME CARE INSTRUCTIONS  °Most of the following instructions are designed to prevent the dislocation of your new hip.  °Remove items at home which could result in a fall. This includes throw rugs or furniture in walking pathways.  °Continue medications as instructed at time of discharge. °· You may have some home medications which will be placed on hold until you complete the course of blood thinner medication. °· You may start showering once you are discharged home. Do not remove your dressing. °Do not put on socks or shoes without following the instructions of your caregivers.   °Sit on chairs with arms. Use the chair arms to help push yourself up when arising.  °Arrange for the use of a toilet seat elevator so you are not sitting low.  °· Walk with walker as instructed.  °You may resume a sexual relationship in one month or when given the OK by your caregiver.  °Use walker as long as suggested by your caregivers.  °You may put full weight on your legs and walk as much as is comfortable. °Avoid periods of inactivity such as sitting longer than an hour when not asleep. This helps prevent  blood clots.  °You may return to work once you are cleared by your surgeon.  °Do not drive a car for 6 weeks or until released by your surgeon.  °Do not drive while taking narcotics.  °Wear elastic stockings for two weeks following surgery during the day but you may remove then at night.  °Make sure you keep all of your appointments after your operation with all of your doctors and caregivers. You should call the office at the above phone number and make an appointment for approximately two weeks after the date of your surgery. °Please pick up a stool softener and laxative for home use as long as you are requiring pain medications. °· ICE to the affected hip every three hours for 30 minutes at a time and then as needed for pain and swelling. Continue to use ice on the hip for pain and swelling from surgery. You may notice swelling that will progress down to the foot and ankle.  This is normal after surgery.  Elevate the leg when you are not up walking on it.   °It is important for you to complete the blood thinner medication as prescribed by your doctor. °· Continue to use the breathing machine which will help keep your temperature down.  It is common for your temperature to cycle up and down following surgery, especially at night when you are not up moving around and exerting yourself.  The breathing machine keeps your lungs expanded and your temperature down. ° °RANGE OF MOTION AND STRENGTHENING EXERCISES  °These exercises are   designed to help you keep full movement of your hip joint. Follow your caregiver's or physical therapist's instructions. Perform all exercises about fifteen times, three times per day or as directed. Exercise both hips, even if you have had only one joint replacement. These exercises can be done on a training (exercise) mat, on the floor, on a table or on a bed. Use whatever works the best and is most comfortable for you. Use music or television while you are exercising so that the exercises  are a pleasant break in your day. This will make your life better with the exercises acting as a break in routine you can look forward to.  °Lying on your back, slowly slide your foot toward your buttocks, raising your knee up off the floor. Then slowly slide your foot back down until your leg is straight again.  °Lying on your back spread your legs as far apart as you can without causing discomfort.  °Lying on your side, raise your upper leg and foot straight up from the floor as far as is comfortable. Slowly lower the leg and repeat.  °Lying on your back, tighten up the muscle in the front of your thigh (quadriceps muscles). You can do this by keeping your leg straight and trying to raise your heel off the floor. This helps strengthen the largest muscle supporting your knee.  °Lying on your back, tighten up the muscles of your buttocks both with the legs straight and with the knee bent at a comfortable angle while keeping your heel on the floor.  ° °SKILLED REHAB INSTRUCTIONS: °If the patient is transferred to a skilled rehab facility following release from the hospital, a list of the current medications will be sent to the facility for the patient to continue.  When discharged from the skilled rehab facility, please have the facility set up the patient's Home Health Physical Therapy prior to being released. Also, the skilled facility will be responsible for providing the patient with their medications at time of release from the facility to include their pain medication and their blood thinner medication. If the patient is still at the rehab facility at time of the two week follow up appointment, the skilled rehab facility will also need to assist the patient in arranging follow up appointment in our office and any transportation needs. ° °MAKE SURE YOU:  °Understand these instructions.  °Will watch your condition.  °Will get help right away if you are not doing well or get worse. ° °Pick up stool softner and  laxative for home use following surgery while on pain medications. °Do not remove your dressing. °The dressing is waterproof--it is OK to take showers. °Continue to use ice for pain and swelling after surgery. °Do not use any lotions or creams on the incision until instructed by your surgeon. °Total Hip Protocol. ° ° °

## 2020-06-08 NOTE — Anesthesia Preprocedure Evaluation (Signed)
Anesthesia Evaluation  Patient identified by MRN, date of birth, ID band Patient awake    Reviewed: Allergy & Precautions, NPO status , Patient's Chart, lab work & pertinent test results  Airway Mallampati: II  TM Distance: >3 FB Neck ROM: Full    Dental  (+) Teeth Intact, Dental Advisory Given   Pulmonary former smoker,    Pulmonary exam normal breath sounds clear to auscultation       Cardiovascular hypertension, Pt. on medications Normal cardiovascular exam Rhythm:Regular Rate:Normal     Neuro/Psych negative neurological ROS  negative psych ROS   GI/Hepatic negative GI ROS, (+) Hepatitis - (treated), C  Endo/Other  Obesity   Renal/GU negative Renal ROS     Musculoskeletal  (+) Arthritis , Osteoarthritis,    Abdominal (+) + obese,   Peds  Hematology negative hematology ROS (+) Plt 231k    Anesthesia Other Findings Day of surgery medications reviewed with the patient.  Reproductive/Obstetrics                             Anesthesia Physical  Anesthesia Plan  ASA: II  Anesthesia Plan: Spinal   Post-op Pain Management:    Induction: Intravenous  PONV Risk Score and Plan: 1 and Propofol infusion and Treatment may vary due to age or medical condition  Airway Management Planned: Natural Airway and Simple Face Mask  Additional Equipment:   Intra-op Plan:   Post-operative Plan:   Informed Consent: I have reviewed the patients History and Physical, chart, labs and discussed the procedure including the risks, benefits and alternatives for the proposed anesthesia with the patient or authorized representative who has indicated his/her understanding and acceptance.     Dental advisory given  Plan Discussed with: CRNA, Anesthesiologist and Surgeon  Anesthesia Plan Comments:         Anesthesia Quick Evaluation

## 2020-06-08 NOTE — Op Note (Signed)
OPERATIVE REPORT  SURGEON: Rod Can, MD   ASSISTANT: Cherlynn June, PA-C.  PREOPERATIVE DIAGNOSIS: Left hip arthritis.   POSTOPERATIVE DIAGNOSIS: Left hip arthritis.   PROCEDURE: Left total hip arthroplasty, anterior approach.   IMPLANTS: DePuy Tri Lock stem, size 7, hi offset. DePuy Pinnacle Cup, size 54 mm. DePuy Altrx liner, size 36 by 54 mm, neutral. DePuy Biolox ceramic head ball, size 36 + 5 mm.  ANESTHESIA:  MAC and Spinal  ESTIMATED BLOOD LOSS:-300 mL    ANTIBIOTICS: 2g Ancef.  DRAINS: None.  COMPLICATIONS: None.   CONDITION: PACU - hemodynamically stable.   BRIEF CLINICAL NOTE: Michael [name] is a 60 y.o. male with a long-standing history of Left hip arthritis. After failing conservative management, the patient was indicated for total hip arthroplasty. The risks, benefits, and alternatives to the procedure were explained, and the patient elected to proceed.  PROCEDURE IN DETAIL: Surgical site was marked by myself in the pre-op holding area. Once inside the operating room, spinal anesthesia was obtained, and a foley catheter was inserted. The patient was then positioned on the Hana table.  All bony prominences were well padded.  The hip was prepped and draped in the normal sterile surgical fashion.  A time-out was called verifying side and site of surgery. The patient received IV antibiotics within 60 minutes of beginning the procedure.   The direct anterior approach to the hip was performed through the Hueter interval.  Lateral femoral circumflex vessels were treated with the Auqumantys. The anterior capsule was exposed and an inverted T capsulotomy was made. The femoral neck cut was made to the level of the templated cut.  A corkscrew was placed into the head and the head was removed.  The femoral head was found to have eburnated bone. The head was passed to the back table and was measured.   Acetabular exposure was achieved, and the pulvinar and labrum  were excised. Sequential reaming of the acetabulum was then performed up to a size 53 mm reamer. A 54 mm cup was then opened and impacted into place at approximately 40 degrees of abduction and 20 degrees of anteversion. The final polyethylene liner was impacted into place and acetabular osteophytes were removed.    I then gained femoral exposure taking care to protect the abductors and greater trochanter.  This was performed using standard external rotation, extension, and adduction.  The capsule was peeled off the inner aspect of the greater trochanter, taking care to preserve the short external rotators. A cookie cutter was used to enter the femoral canal, and then the femoral canal finder was placed.  Sequential broaching was performed up to a size 7.  Calcar planer was used on the femoral neck remnant.  I placed a hi offset neck and a trial head ball.  The hip was reduced.  Leg lengths and offset were checked fluoroscopically.  The hip was dislocated and trial components were removed.  The final implants were placed, and the hip was reduced.  Fluoroscopy was used to confirm component position and leg lengths.  At 90 degrees of external rotation and full extension, the hip was stable to an anterior directed force.   The wound was copiously irrigated with Irrisept solution and normal saline using pule lavage.  Marcaine solution was injected into the periarticular soft tissue.  The wound was closed in layers using #1 Stratafix for the fascia, 2-0 Vicryl for the subcutaneous fat, 2-0 Monocryl for the deep dermal layer, 3-0 running Monocryl subcuticular stitch,  and Dermabond for the skin.  Once the glue was fully dried, an Aquacell Ag dressing was applied.  The patient was transported to the recovery room in stable condition.  Sponge, needle, and instrument counts were correct at the end of the case x2.  The patient tolerated the procedure well and there were no known complications.  Please note that a  surgical assistant was a medical necessity for this procedure to perform it in a safe and expeditious manner. Assistant was necessary to provide appropriate retraction of vital neurovascular structures, to prevent femoral fracture, and to allow for anatomic placement of the prosthesis.

## 2020-06-08 NOTE — Anesthesia Procedure Notes (Signed)
Spinal  Patient location during procedure: OR Start time: 06/08/2020 7:50 AM End time: 06/08/2020 7:55 AM Staffing Performed: anesthesiologist  Anesthesiologist: Lowella Curb, MD Preanesthetic Checklist Completed: patient identified, IV checked, site marked, risks and benefits discussed, surgical consent, monitors and equipment checked, pre-op evaluation and timeout performed Spinal Block Patient position: sitting Prep: DuraPrep Patient monitoring: heart rate, cardiac monitor, continuous pulse ox and blood pressure Approach: midline Location: L3-4 Injection technique: single-shot Needle Needle type: Quincke  Needle gauge: 22 G Needle length: 9 cm Assessment Sensory level: T4

## 2020-06-08 NOTE — Transfer of Care (Signed)
Immediate Anesthesia Transfer of Care Note  Patient: Michael [name]  Procedure(s) Performed: TOTAL HIP ARTHROPLASTY ANTERIOR APPROACH (Left Hip)  Patient Location: PACU  Anesthesia Type:MAC and Spinal  Level of Consciousness: awake, alert , sedated and pateint uncooperative  Airway & Oxygen Therapy: Patient Spontanous Breathing and Patient connected to face mask oxygen  Post-op Assessment: Report given to RN and Post -op Vital signs reviewed and stable  Post vital signs: stable  Last Vitals:  Vitals Value Taken Time  BP 136/97 06/08/20 1027  Temp    Pulse 77 06/08/20 1028  Resp 11 06/08/20 1028  SpO2 98 % 06/08/20 1028  Vitals shown include unvalidated device data.  Last Pain:  Vitals:   06/08/20 0624  TempSrc: Oral  PainSc:          Complications: No complications documented.

## 2020-06-08 NOTE — Interval H&P Note (Signed)
History and Physical Interval Note:  06/08/2020 7:45 AM  Michael [name]  has presented today for surgery, with the diagnosis of degenerative joint disease left hip.  The various methods of treatment have been discussed with the patient and family. After consideration of risks, benefits and other options for treatment, the patient has consented to  Procedure(s): TOTAL HIP ARTHROPLASTY ANTERIOR APPROACH (Left) as a surgical intervention.  The patient's history has been reviewed, patient examined, no change in status, stable for surgery.  I have reviewed the patient's chart and labs.  Questions were answered to the patient's satisfaction.    The risks, benefits, and alternatives were discussed with the patient. There are risks associated with the surgery including, but not limited to, problems with anesthesia (death), infection, instability (giving out of the joint), dislocation, differences in leg length/angulation/rotation, fracture of bones, loosening or failure of implants, hematoma (blood accumulation) which may require surgical drainage, blood clots, pulmonary embolism, nerve injury (foot drop and lateral thigh numbness), and blood vessel injury. The patient understands these risks and elects to proceed.    Michael [name]

## 2020-06-09 ENCOUNTER — Encounter (HOSPITAL_COMMUNITY): Payer: Self-pay | Admitting: Orthopedic Surgery

## 2020-06-09 ENCOUNTER — Other Ambulatory Visit: Payer: Self-pay | Admitting: Internal Medicine

## 2020-06-09 DIAGNOSIS — I10 Essential (primary) hypertension: Secondary | ICD-10-CM

## 2020-07-05 ENCOUNTER — Other Ambulatory Visit: Payer: Self-pay | Admitting: Internal Medicine

## 2020-07-05 MED ORDER — METHADONE HCL 10 MG PO TABS
30.0000 mg | ORAL_TABLET | Freq: Three times a day (TID) | ORAL | 0 refills | Status: DC
Start: 2020-07-05 — End: 2022-12-17

## 2020-07-22 ENCOUNTER — Other Ambulatory Visit: Payer: Self-pay | Admitting: Internal Medicine

## 2020-07-22 ENCOUNTER — Other Ambulatory Visit: Payer: Self-pay | Admitting: Physician Assistant

## 2020-07-22 DIAGNOSIS — I10 Essential (primary) hypertension: Secondary | ICD-10-CM

## 2020-07-22 DIAGNOSIS — E785 Hyperlipidemia, unspecified: Secondary | ICD-10-CM

## 2020-07-22 NOTE — Telephone Encounter (Signed)
Requested Prescriptions  Pending Prescriptions Disp Refills  . hydrALAZINE (APRESOLINE) 10 MG tablet [Pharmacy Med Name: HYDRALAZINE 10 MG TABLET] 180 tablet 0    Sig: TAKE 1 TABLET BY MOUTH TWICE A DAY     Cardiovascular:  Vasodilators Failed - 07/22/2020 11:00 AM      Failed - Last BP in normal range    BP Readings from Last 1 Encounters:  06/08/20 (!) 154/84         Passed - HCT in normal range and within 360 days    HCT  Date Value Ref Range Status  06/01/2020 42.9 39.0 - 52.0 % Final   Hematocrit  Date Value Ref Range Status  03/30/2020 45.4 37.5 - 51.0 % Final         Passed - HGB in normal range and within 360 days    Hemoglobin  Date Value Ref Range Status  06/01/2020 14.5 13.0 - 17.0 g/dL Final  78/93/8101 75.1 13.0 - 17.7 g/dL Final         Passed - RBC in normal range and within 360 days    RBC  Date Value Ref Range Status  06/01/2020 4.77 4.22 - 5.81 MIL/uL Final         Passed - WBC in normal range and within 360 days    WBC  Date Value Ref Range Status  06/01/2020 8.1 4.0 - 10.5 K/uL Final         Passed - PLT in normal range and within 360 days    Platelets  Date Value Ref Range Status  06/01/2020 299 150 - 400 K/uL Final  03/30/2020 339 150 - 450 x10E3/uL Final         Passed - Valid encounter within last 12 months    Recent Outpatient Visits          2 months ago Pre-operative clearance   [place] [facility] Marcine Matar, MD   3 months ago Encounter for preoperative assessment   [place] [facility] Marcine Matar, MD   7 months ago Hyperlipidemia, unspecified hyperlipidemia type   [facility] [place], Marzella Schlein, [place]   1 year ago Essential hypertension   [place] [facility] [place], [place], [place]   1 year ago Edema of both legs   [facility] Marcine Matar, MD      Future Appointments            In 1  week Marcine Matar, MD [facility]

## 2020-07-22 NOTE — Telephone Encounter (Signed)
Requested Prescriptions  Pending Prescriptions Disp Refills  . atorvastatin (LIPITOR) 10 MG tablet [Pharmacy Med Name: ATORVASTATIN 10 MG TABLET] 90 tablet     Sig: TAKE 1 TABLET BY MOUTH EVERY DAY     Cardiovascular:  Antilipid - Statins Failed - 07/22/2020 12:21 PM      Failed - LDL in normal range and within 360 days    LDL Chol Calc (NIH)  Date Value Ref Range Status  03/30/2020 99 0 - 99 mg/dL Final         Failed - Triglycerides in normal range and within 360 days    Triglycerides  Date Value Ref Range Status  03/30/2020 272 (H) 0 - 149 mg/dL Final         Passed - Total Cholesterol in normal range and within 360 days    Cholesterol, Total  Date Value Ref Range Status  03/30/2020 197 100 - 199 mg/dL Final         Passed - HDL in normal range and within 360 days    HDL  Date Value Ref Range Status  03/30/2020 52 >39 mg/dL Final         Passed - Patient is not pregnant      Passed - Valid encounter within last 12 months    Recent Outpatient Visits          2 months ago Pre-operative clearance   [place] [facility] Marcine Matar, MD   3 months ago Encounter for preoperative assessment   [place] [facility] Marcine Matar, MD   7 months ago Hyperlipidemia, unspecified hyperlipidemia type   [facility] [place], Marzella Schlein, [place]   1 year ago Essential hypertension   [place] [facility] [place], [place], [place]   1 year ago Edema of both legs   [facility] Marcine Matar, MD      Future Appointments            In 1 week Marcine Matar, MD [facility]           . lisinopril-hydrochlorothiazide (ZESTORETIC) 20-25 MG tablet [Pharmacy Med Name: LISINOPRIL-HCTZ 20-25 MG TAB] 90 tablet 1    Sig: TAKE 1 TABLET BY MOUTH EVERY DAY     Cardiovascular:  ACEI + Diuretic Combos Failed - 07/22/2020 12:21 PM       Failed - Last BP in normal range    BP Readings from Last 1 Encounters:  06/08/20 (!) 154/84         Passed - Na in normal range and within 180 days    Sodium  Date Value Ref Range Status  06/01/2020 138 135 - 145 mmol/L Final  03/30/2020 137 134 - 144 mmol/L Final         Passed - K in normal range and within 180 days    Potassium  Date Value Ref Range Status  06/01/2020 4.5 3.5 - 5.1 mmol/L Final         Passed - Cr in normal range and within 180 days    Creatinine, Ser  Date Value Ref Range Status  06/01/2020 0.90 0.61 - 1.24 mg/dL Final         Passed - Ca in normal range and within 180 days    Calcium  Date Value Ref Range Status  06/01/2020 9.6 8.9 - 10.3 mg/dL Final  Passed - Patient is not pregnant      Passed - Valid encounter within last 6 months    Recent Outpatient Visits          2 months ago Pre-operative clearance   [place] [facility] Marcine Matar, MD   3 months ago Encounter for preoperative assessment   [place] [facility] Marcine Matar, MD   7 months ago Hyperlipidemia, unspecified hyperlipidemia type   [facility] [place], Marzella Schlein, [place]   1 year ago Essential hypertension   [place] [facility] [place], [place], [place]   1 year ago Edema of both legs   [facility] Marcine Matar, MD      Future Appointments            In 1 week Marcine Matar, MD [facility]

## 2020-07-25 ENCOUNTER — Ambulatory Visit: Payer: Self-pay | Admitting: Student

## 2020-07-26 NOTE — Progress Notes (Signed)
DUE TO COVID-19 ONLY ONE VISITOR IS ALLOWED TO COME WITH YOU AND STAY IN THE WAITING ROOM ONLY DURING PRE OP AND PROCEDURE DAY OF SURGERY. THE 1 VISITOR  MAY VISIT WITH YOU AFTER SURGERY IN YOUR PRIVATE ROOM DURING VISITING HOURS ONLY!  YOU NEED TO HAVE A COVID 19 TEST ON___3/04/2021 ____ @_______ , THIS TEST MUST BE DONE BEFORE SURGERY,  COVID TESTING SITE [address] [place] , IT IS ON THE RIGHT GOING OUT [address] APPROXIMATELY  2 MINUTES PAST [employer] ON THE RIGHT. ONCE YOUR COVID TEST IS COMPLETED,  PLEASE BEGIN THE QUARANTINE INSTRUCTIONS AS OUTLINED IN YOUR HANDOUT.                Michael [name]  07/26/2020   Your procedure is scheduled on: 08/09/2020    Report to [facility] Main  Entrance   Report to admitting at     0600 AM     Call this number if you have problems the morning of surgery [phone]    REMEMBER: NO  SOLID FOOD CANDY OR GUM AFTER MIDNIGHT. CLEAR LIQUIDS UNTIL  0530am        . NOTHING BY MOUTH EXCEPT CLEAR LIQUIDS UNTIL    . PLEASE FINISH ENSURE DRINK PER SURGEON ORDER  WHICH NEEDS TO BE COMPLETED AT   0530am    .      CLEAR LIQUID DIET   Foods Allowed                                                                    Coffee and tea, regular and decaf                            Fruit ices (not with fruit pulp)                                      Iced Popsicles                                    Carbonated beverages, regular and diet                                    Cranberry, grape and apple juices Sports drinks like Gatorade Lightly seasoned clear broth or consume(fat free) Sugar, honey syrup ___________________________________________________________________      BRUSH YOUR TEETH MORNING OF SURGERY AND RINSE YOUR MOUTH OUT, NO CHEWING GUM CANDY OR MINTS.     Take these medicines the morning of surgery with A SIP OF WATER: Amlodipine, hydralazine, methadone   DO NOT TAKE ANY DIABETIC MEDICATIONS  DAY OF YOUR SURGERY                               You may not have any metal on your body including hair pins and              piercings  Do not wear jewelry, make-up, lotions, powders or perfumes, deodorant             Do not wear nail polish on your fingernails.  Do not shave  48 hours prior to surgery.              Men may shave face and neck.   Do not bring valuables to the hospital. [place].  Contacts, dentures or bridgework may not be worn into surgery.  Leave suitcase in the car. After surgery it may be brought to your room.     Patients discharged the day of surgery will not be allowed to drive home. IF YOU ARE HAVING SURGERY AND GOING HOME THE SAME DAY, YOU MUST HAVE AN ADULT TO DRIVE YOU HOME AND BE WITH YOU FOR 24 HOURS. YOU MAY GO HOME BY TAXI OR UBER OR ORTHERWISE, BUT AN ADULT MUST ACCOMPANY YOU HOME AND STAY WITH YOU FOR 24 HOURS.  Name and phone number of your driver:  Special Instructions: N/A              Please read over the following fact sheets you were given: _____________________________________________________________________  [facility] - Preparing for Surgery Before surgery, you can play an important role.  Because skin is not sterile, your skin needs to be as free of germs as possible.  You can reduce the number of germs on your skin by washing with CHG (chlorahexidine gluconate) soap before surgery.  CHG is an antiseptic cleaner which kills germs and bonds with the skin to continue killing germs even after washing. Please DO NOT use if you have an allergy to CHG or antibacterial soaps.  If your skin becomes reddened/irritated stop using the CHG and inform your nurse when you arrive at Short Stay. Do not shave (including legs and underarms) for at least 48 hours prior to the first CHG shower.  You may shave your face/neck. Please follow these instructions carefully:  1.  Shower with CHG Soap the night before  surgery and the  morning of Surgery.  2.  If you choose to wash your hair, wash your hair first as usual with your  normal  shampoo.  3.  After you shampoo, rinse your hair and body thoroughly to remove the  shampoo.                           4.  Use CHG as you would any other liquid soap.  You can apply chg directly  to the skin and wash                       Gently with a scrungie or clean washcloth.  5.  Apply the CHG Soap to your body ONLY FROM THE NECK DOWN.   Do not use on face/ open                           Wound or open sores. Avoid contact with eyes, ears mouth and genitals (private parts).                       Wash face,  Genitals (private parts) with your normal soap.             6.  Wash  thoroughly, paying special attention to the area where your surgery  will be performed.  7.  Thoroughly rinse your body with warm water from the neck down.  8.  DO NOT shower/wash with your normal soap after using and rinsing off  the CHG Soap.                9.  Pat yourself dry with a clean towel.            10.  Wear clean pajamas.            11.  Place clean sheets on your bed the night of your first shower and do not  sleep with pets. Day of Surgery : Do not apply any lotions/deodorants the morning of surgery.  Please wear clean clothes to the hospital/surgery center.  FAILURE TO FOLLOW THESE INSTRUCTIONS MAY RESULT IN THE CANCELLATION OF YOUR SURGERY PATIENT SIGNATURE_________________________________  NURSE SIGNATURE__________________________________  ________________________________________________________________________

## 2020-07-31 ENCOUNTER — Ambulatory Visit: Payer: Self-pay | Admitting: Student

## 2020-07-31 NOTE — H&P (Signed)
TOTAL KNEE REVISION ADMISSION H&P  Patient is being admitted for left revision total knee arthroplasty.  Subjective:  Chief Complaint:left knee pain.  HPI: Michael [name], 60 y.o. male, has a history of pain and functional disability in the left knee(s) due to failed previous arthroplasty and patient has failed non-surgical conservative treatments for greater than 12 weeks to include NSAID's and/or analgesics, weight reduction as appropriate and activity modification. The indications for the revision of the total knee arthroplasty are loosening of one or more components. Onset of symptoms was abrupt starting 1 years ago with gradually worsening course since that time.  Prior procedures on the left knee(s) include total knee arthroplasty, total knee arthroplasty revision.  Patient currently rates pain in the left knee(s) at 8 out of 10 with activity. There is worsening of pain with activity and weight bearing, pain that interferes with activities of daily living and pain with passive range of motion.  Patient has evidence of prosthetic loosening by imaging studies. This condition presents safety issues increasing the risk of falls.  There is no current active infection.  Patient Active Problem List   Diagnosis Date Noted  . Prediabetes 05/03/2020  . Osteoarthritis of left hip 12/10/2019  . 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 Status post revision of total knee replacement, 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  . Unilateral primary osteoarthritis, left hip 09/08/2018  . S/P revision of total knee, left 06/15/2018  . Positive depression screening 05/22/2017  . Hepatitis C virus infection cured after antiviral drug therapy 01/19/2017  . Hyperlipidemia 10/23/2016  . Hypertension 10/18/2016  . Tobacco dependence 10/18/2016  . 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 Right rotator cuff tear 01/29/2013   Past Medical History:  Diagnosis Date  . Arthritis   . Hepatitis     Hep C rx  . Hypertension   . Pre-diabetes     Past Surgical History:  Procedure Laterality Date  . COLONOSCOPY    . left knee arthroscopy     . left rotator cuff surgery     . REVISION TOTAL KNEE ARTHROPLASTY Left 06/15/2018  . SHOULDER OPEN ROTATOR CUFF REPAIR Right 01/29/2013   Procedure: RIGHT SHOULDER MINI ROTATOR CUFF REPAIR;  Surgeon: Javier Docker, MD;  Location: [facility];  Service: Orthopedics;  Laterality: Right;  With ANCHORS  . TOTAL HIP ARTHROPLASTY Left 06/08/2020   Procedure: TOTAL HIP ARTHROPLASTY ANTERIOR APPROACH;  Surgeon: Samson Frederic, MD;  Location: [facility];  Service: Orthopedics;  Laterality: Left;  . TOTAL KNEE ARTHROPLASTY Left 02/12/2017  . TOTAL KNEE ARTHROPLASTY Left 02/12/2017   Procedure: LEFT TOTAL KNEE ARTHROPLASTY;  Surgeon: Tarry Kos, MD;  Location: [facility] OR;  Service: Orthopedics;  Laterality: Left;  . TOTAL KNEE REVISION Left 06/15/2018   Procedure: LEFT TOTAL KNEE REVISION;  Surgeon: Tarry Kos, MD;  Location: [facility] OR;  Service: Orthopedics;  Laterality: Left;  . TOTAL KNEE REVISION Left 03/08/2019   Procedure: LEFT TOTAL KNEE REVISION;  Surgeon: Tarry Kos, MD;  Location: [facility] OR;  Service: Orthopedics;  Laterality: Left;    Current Outpatient Medications  Medication Sig Dispense Refill Last Dose  . amLODipine (NORVASC) 10 MG tablet TAKE 1 TABLET BY MOUTH EVERY DAY (Patient taking differently: Take 10 mg by mouth daily.) 90 tablet 0   . aspirin 81 MG chewable tablet Chew 81 mg by mouth 2 (two) times daily.     [name] atorvastatin (LIPITOR) 20 MG tablet Take 1 tablet (20 mg  2 (two) times daily.     . atorvastatin (LIPITOR) 20 MG tablet Take 1 tablet (20 mg total) by mouth daily. 90 tablet 1   . docusate sodium (COLACE) 100 MG capsule Take 1 capsule (100 mg total) by mouth 2 (two) times daily. (Patient not taking: Reported on 07/25/2020) 60 capsule 1   . hydrALAZINE (APRESOLINE) 10 MG tablet TAKE 1 TABLET BY MOUTH TWICE A DAY (Patient taking differently: Take 10 mg by mouth 2  (two) times daily.) 180 tablet 0   . HYDROcodone-acetaminophen (NORCO) 5-325 MG tablet Take 1 tablet by mouth every 4 (four) hours as needed for moderate pain. (Patient not taking: Reported on 07/25/2020) 40 tablet 0   . lisinopril-hydrochlorothiazide (ZESTORETIC) 20-25 MG tablet TAKE 1 TABLET BY MOUTH EVERY DAY (Patient taking differently: Take 1 tablet by mouth daily.) 90 tablet 1   . methadone (DOLOPHINE) 10 MG tablet Take 3 tablets (30 mg total) by mouth every 8 (eight) hours. (Patient not taking: Reported on 07/25/2020) 30 tablet 0   . nicotine polacrilex (NICORETTE) 2 MG gum TAKE 1 EACH (2 MG TOTAL) BY MOUTH AS NEEDED FOR SMOKING CESSATION. MAX 30 PIECES/DAY 100 each 0   . ondansetron (ZOFRAN) 4 MG tablet Take 1 tablet (4 mg total) by mouth every 8 (eight) hours as needed for nausea or vomiting. (Patient not taking: Reported on 07/25/2020) 20 tablet 0   . psyllium (METAMUCIL) 58.6 % packet Take 1 packet by mouth daily.     . senna (SENOKOT) 8.6 MG TABS tablet Take 2 tablets (17.2 mg total) by mouth at bedtime. (Patient not taking: Reported on 07/25/2020) 60 tablet 1   . traZODone (DESYREL) 50 MG tablet TAKE 0.5 TABLETS (25 MG TOTAL) BY MOUTH AT BEDTIME AS NEEDED FOR SLEEP. 45 tablet 0    No current facility-administered medications for this visit.       Allergies  Allergen Reactions  . Chantix [Varenicline] Itching and Rash    Social History        Tobacco Use  . Smoking status: Former Smoker    Packs/day: 1.00    Years: 37.00    Pack years: 37.00    Types: Cigarettes    Quit date: 04/20/2020    Years since quitting: 0.2  . Smokeless tobacco: Never Used  Substance Use Topics  . Alcohol use: Not Currently         Family History  Problem Relation Age of Onset  . CAD Mother   . Deep vein thrombosis Brother       Review of Systems  Musculoskeletal: Positive for arthralgias.  All other systems reviewed and are negative.    Objective:  Physical  Exam HENT:     Head: Normocephalic and atraumatic.  Eyes:     Extraocular Movements: Extraocular movements intact.     Pupils: Pupils are equal, round, and reactive to light.  Cardiovascular:     Rate and Rhythm: Normal rate and regular rhythm.     Heart sounds: Normal heart sounds.  Abdominal:     Palpations: Abdomen is soft.     Tenderness: There is no abdominal tenderness.  Genitourinary:    Comments: Deferred Musculoskeletal:        General: Tenderness present.     Cervical back: Normal range of motion and neck supple.  Skin:    General: Skin is warm and dry.  Neurological:     Mental Status: He is alert and oriented to person, place, and time.  Psychiatric:          calculated from the following:   Height as of 06/08/20: 5\' 11"  (1.803 m).   Weight as of 06/08/20: 119.7 kg.  Imaging Review Plain radiographs demonstrate severe degenerative joint disease of the left knee(s). The overall alignment is neutral.There is evidence of loosening of the tibial components. The bone quality appears to be adequate for age and reported activity level.    Assessment/Plan:  End stage arthritis, left knee(s) with failed previous arthroplasty.   The patient history, physical examination, clinical judgment of the provider and imaging studies are consistent with end stage degenerative joint disease of the left knee(s), previous total knee arthroplasty. Revision total knee arthroplasty is deemed medically necessary. The treatment options including medical management, injection therapy, arthroscopy and revision arthroplasty were discussed at length. The risks and benefits of revision total knee arthroplasty were presented and reviewed. The risks due to aseptic loosening, infection, stiffness, patella tracking problems, thromboembolic complications and other imponderables were discussed. The patient acknowledged the explanation, agreed to proceed with the plan and consent was  signed. Patient is being admitted for inpatient treatment for surgery, pain control, PT, OT, prophylactic antibiotics, VTE prophylaxis, progressive ambulation and ADL's and discharge planning.The patient is planning to be discharged home

## 2020-08-01 ENCOUNTER — Encounter (HOSPITAL_COMMUNITY)
Admission: RE | Admit: 2020-08-01 | Discharge: 2020-08-01 | Disposition: A | Discharge status: Home / Self Care | Payer: 59 | Source: Ambulatory Visit | Attending: Orthopedic Surgery | Admitting: Orthopedic Surgery

## 2020-08-03 ENCOUNTER — Encounter: Payer: Self-pay | Admitting: Internal Medicine

## 2020-08-03 ENCOUNTER — Other Ambulatory Visit: Payer: Self-pay

## 2020-08-03 ENCOUNTER — Ambulatory Visit: Payer: 59 | Attending: Internal Medicine | Admitting: Internal Medicine

## 2020-08-03 VITALS — BP 148/80 | HR 84 | Resp 16 | Wt 267.8 lb

## 2020-08-03 DIAGNOSIS — Z79899 Other long term (current) drug therapy: Secondary | ICD-10-CM | POA: Insufficient documentation

## 2020-08-03 DIAGNOSIS — Z7982 Long term (current) use of aspirin: Secondary | ICD-10-CM | POA: Insufficient documentation

## 2020-08-03 DIAGNOSIS — E669 Obesity, unspecified: Secondary | ICD-10-CM | POA: Insufficient documentation

## 2020-08-03 DIAGNOSIS — I10 Essential (primary) hypertension: Secondary | ICD-10-CM | POA: Diagnosis not present

## 2020-08-03 DIAGNOSIS — R7303 Prediabetes: Secondary | ICD-10-CM | POA: Insufficient documentation

## 2020-08-03 DIAGNOSIS — E785 Hyperlipidemia, unspecified: Secondary | ICD-10-CM | POA: Diagnosis not present

## 2020-08-03 DIAGNOSIS — Z6839 Body mass index (BMI) 39.0-39.9, adult: Secondary | ICD-10-CM | POA: Diagnosis not present

## 2020-08-03 DIAGNOSIS — Z8249 Family history of ischemic heart disease and other diseases of the circulatory system: Secondary | ICD-10-CM | POA: Insufficient documentation

## 2020-08-03 DIAGNOSIS — F1120 Opioid dependence, uncomplicated: Secondary | ICD-10-CM

## 2020-08-03 DIAGNOSIS — Z87891 Personal history of nicotine dependence: Secondary | ICD-10-CM | POA: Diagnosis not present

## 2020-08-03 DIAGNOSIS — F17200 Nicotine dependence, unspecified, uncomplicated: Secondary | ICD-10-CM | POA: Diagnosis not present

## 2020-08-03 MED ORDER — VARENICLINE TARTRATE 0.5 MG PO TABS: ORAL_TABLET | ORAL | 0 refills | Status: DC

## 2020-08-03 MED ORDER — VARENICLINE TARTRATE 1 MG PO TABS: 1.0000 mg | ORAL_TABLET | Freq: Two times a day (BID) | ORAL | 1 refills | Status: DC

## 2020-08-03 NOTE — Progress Notes (Signed)
Patient ID: Michael [name], male    DOB: 08-26-1960  MRN: [id number]  CC: Chronic disease management  Subjective: Michael [name] is a 60 y.o. male who presents for chronic ds management His concerns today include:  Pt with hx ofHTN,HL,preDM, obesity, OA LT hip and lumbar spine,Hep C treated with interferon/rib combo in [place] 2005, tobacco abuse, substance use disorderwas onMethadone in past  Since last visit with me, patient has had the left total hip replacement.  He states that the hip is feeling a lot better.  He is now being scheduled for revision of the knee replacement for the left knee.  It was scheduled for next week but procedure canceled as patient started smoking again 2 weeks after his hip replacement surgery.  He needs to be tobacco free for at least 6 weeks prior to surgery on the left knee.  He has smoked since he was in his 15s.  One time he had quit for 1 full year but he was in jail at that time.   He is requesting to be placed on Chantix to help with smoking cessation.  Dates that the nicotine gum does not decrease his cravings significantly.  Reports being on Chantix in the past but had quit it after about 1 to 2 weeks because he had developed a facial rash.  However in hindsight he feels that the rash was not due to Chantix.  During that time he was buying drugs off the street and drinking heavily and thinks that played a role.  He does recall that when he took the Chantix for 2 weeks it did decrease his cravings for cigarettes significantly.  He would like to have the Chantix removed from his allergy list so that he can give it another try. He is still on methadone 60 mg daily through [employer]. He will need another preoperative exam for the revision to his left knee.  Obesity/PreDM:  Eating a lot greens and fish, fruits. Never been a sweet eater.  Cut back to one 12 oz can Coke a day.  Not able to do much in terms of exercise due to the issue that he has with  his left knee.  It feels like the knee slips when he walks.  HTN:  Checks BP with arm cuff.  Gives home blood pressure range in 120s/70s.  Blood pressure yesterday however was 132/83.  Compliant with meds and took them already for the morning.   HL: Taking and tolerating Lipitor Patient Active Problem List   Diagnosis Date Noted  . Prediabetes 05/03/2020  . Osteoarthritis of left hip 12/10/2019  . 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 Status post revision of total knee replacement, 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  . Unilateral primary osteoarthritis, left hip 09/08/2018  . S/P revision of total knee, left 06/15/2018  . Positive depression screening 05/22/2017  . Hepatitis C virus infection cured after antiviral drug therapy 01/19/2017  . Hyperlipidemia 10/23/2016  . Hypertension 10/18/2016  . Tobacco dependence 10/18/2016  . 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 Right rotator cuff tear 01/29/2013     Current Outpatient Medications on File Prior to Visit  Medication Sig Dispense Refill  . amLODipine (NORVASC) 10 MG tablet TAKE 1 TABLET BY MOUTH EVERY DAY (Patient taking differently: Take 10 mg by mouth daily.) 90 tablet 0  . atorvastatin (LIPITOR) 20 MG tablet Take 1 tablet (20 mg total) by mouth daily. 90 tablet 1  . hydrALAZINE (  APRESOLINE) 10 MG tablet TAKE 1 TABLET BY MOUTH TWICE A DAY (Patient taking differently: Take 10 mg by mouth 2 (two) times daily.) 180 tablet 0  . lisinopril-hydrochlorothiazide (ZESTORETIC) 20-25 MG tablet TAKE 1 TABLET BY MOUTH EVERY DAY (Patient taking differently: Take 1 tablet by mouth daily.) 90 tablet 1  . methadone (DOLOPHINE) 10 MG tablet Take 3 tablets (30 mg total) by mouth every 8 (eight) hours. (Patient taking differently: Take 60 mg by mouth every 8 (eight) hours.) 30 tablet 0  . nicotine polacrilex (NICORETTE) 2 MG gum TAKE 1 EACH (2 MG TOTAL) BY MOUTH AS NEEDED FOR SMOKING CESSATION. MAX 30 PIECES/DAY 100 each 0   . psyllium (METAMUCIL) 58.6 % packet Take 1 packet by mouth daily.    [name] aspirin 81 MG chewable tablet Chew 81 mg by mouth 2 (two) times daily. (Patient not taking: Reported on 08/03/2020)    . ondansetron (ZOFRAN) 4 MG tablet Take 1 tablet (4 mg total) by mouth every 8 (eight) hours as needed for nausea or vomiting. (Patient not taking: No sig reported) 20 tablet 0  . [DISCONTINUED] carvedilol (COREG) 6.25 MG tablet Take 1 tablet (6.25 mg total) by mouth 2 (two) times daily with a meal. 60 tablet 3   No current facility-administered medications on file prior to visit.    No Active Allergies  Social History   Socioeconomic History  . Marital status: Single    Spouse name: Not on file  . Number of children: 0  . Years of education: 80  . Highest education level: Not on file  Occupational History  . Occupation: Chartered certified accountant  Tobacco Use  . Smoking status: Former Smoker    Packs/day: 1.00    Years: 37.00    Pack years: 37.00    Types: Cigarettes    Quit date: 04/20/2020    Years since quitting: 0.2  . Smokeless tobacco: Never Used  Vaping Use  . Vaping Use: Never used  Substance and Sexual Activity  . Alcohol use: Not Currently  . Drug use: No    Comment: hx of marijuana and cocaine, LSD  . Sexual activity: Yes    Partners: Female    Birth control/protection: None  Other Topics Concern  . Not on file  Social History Narrative  . Not on file   Social Determinants of Health   Financial Resource Strain: Not on file  Food Insecurity: Not on file  Transportation Needs: Not on file  Physical Activity: Not on file  Stress: Not on file  Social Connections: Not on file  Intimate Partner Violence: Not on file    Family History  Problem Relation Age of Onset  . CAD Mother   . Deep vein thrombosis Brother     Past Surgical History:  Procedure Laterality Date  . COLONOSCOPY    . left knee arthroscopy     . left rotator cuff surgery     . REVISION TOTAL KNEE ARTHROPLASTY  Left 06/15/2018  . SHOULDER OPEN ROTATOR CUFF REPAIR Right 01/29/2013   Procedure: RIGHT SHOULDER MINI ROTATOR CUFF REPAIR;  Surgeon: Javier Docker, MD;  Location: [facility];  Service: Orthopedics;  Laterality: Right;  With ANCHORS  . TOTAL HIP ARTHROPLASTY Left 06/08/2020   Procedure: TOTAL HIP ARTHROPLASTY ANTERIOR APPROACH;  Surgeon: Samson Frederic, MD;  Location: [facility];  Service: Orthopedics;  Laterality: Left;  . TOTAL KNEE ARTHROPLASTY Left 02/12/2017  . TOTAL KNEE ARTHROPLASTY Left 02/12/2017   Procedure: LEFT TOTAL KNEE ARTHROPLASTY;  Surgeon:  Tarry Kos, MD;  Location[facility];  Service: Orthopedics;  Laterality: Left;  . TOTAL KNEE REVISION Left 06/15/2018   Procedure: LEFT TOTAL KNEE REVISION;  Surgeon: Tarry Kos, MD;  Location: [facility] OR;  Service: Orthopedics;  Laterality: Left;  . TOTAL KNEE REVISION Left 03/08/2019   Procedure: LEFT TOTAL KNEE REVISION;  Surgeon: Tarry Kos, MD;  Location: [facility] OR;  Service: Orthopedics;  Laterality: Left;    ROS: Review of Systems Negative except as stated above  PHYSICAL EXAM: BP (!) 151/91   Pulse 84   Resp 16   Wt 267 lb 12.8 oz (121.5 kg)   SpO2 96%   BMI 37.35 kg/m   Wt Readings from Last 3 Encounters:  08/03/20 267 lb 12.8 oz (121.5 kg)  06/08/20 264 lb (119.7 kg)  06/01/20 264 lb (119.7 kg)  BP 148/80  Physical Exam General appearance - alert, well appearing, obese middle-age Caucasian male and in no distress Mental status - normal mood, behavior, speech, dress, motor activity, and thought processes Neck - supple, no significant adenopathy Chest - clear to auscultation, no wheezes, rales or rhonchi, symmetric air entry Heart - normal rate, regular rhythm, normal S1, S2, no murmurs, rubs, clicks or gallops Musculoskeletal -left knee: The joint is enlarged.  No point tenderness.  He has mild discomfort with passive range of motion.  Gait is wide-based.  He walks without an assistive device. Extremities -trace lower extremity  edema CMP Latest Ref Rng & Units 06/01/2020 05/15/2020 03/30/2020  Glucose 70 - 99 mg/dL 75 94 90  BUN 6 - 20 mg/dL 81(X) 12 16  Creatinine 0.61 - 1.24 mg/dL 9.14 7.82 9.56  Sodium 135 - 145 mmol/L 138 135 137  Potassium 3.5 - 5.1 mmol/L 4.5 4.0 4.2  Chloride 98 - 111 mmol/L 100 97(L) 96  CO2 22 - 32 mmol/L 28 26 25   Calcium 8.9 - 10.3 mg/dL 9.6 9.5 9.6  Total Protein 6.5 - 8.1 g/dL 7.9 7.9 7.3  Total Bilirubin 0.3 - 1.2 mg/dL 0.6 0.6 0.3  Alkaline Phos 38 - 126 U/L 76 81 111  AST 15 - 41 U/L 23 20 19   ALT 0 - 44 U/L 22 24 22    Lipid Panel     Component Value Date/Time   CHOL 197 03/30/2020 1456   TRIG 272 (H) 03/30/2020 1456   HDL 52 03/30/2020 1456   CHOLHDL 3.8 03/30/2020 1456   LDLCALC 99 03/30/2020 1456    CBC    Component Value Date/Time   WBC 8.1 06/01/2020 0826   RBC 4.77 06/01/2020 0826   HGB 14.5 06/01/2020 0826   HGB 15.1 03/30/2020 1456   HCT 42.9 06/01/2020 0826   HCT 45.4 03/30/2020 1456   PLT 299 06/01/2020 0826   PLT 339 03/30/2020 1456   MCV 89.9 06/01/2020 0826   MCV 90 03/30/2020 1456   MCH 30.4 06/01/2020 0826   MCHC 33.8 06/01/2020 0826   RDW 12.9 06/01/2020 0826   RDW 12.8 03/30/2020 1456   LYMPHSABS 1.7 03/02/2019 0927   LYMPHSABS 1.4 05/12/2018 0853   MONOABS 0.7 03/02/2019 0927   EOSABS 0.2 03/02/2019 0927   EOSABS 0.2 05/12/2018 0853   BASOSABS 0.1 03/02/2019 0927   BASOSABS 0.0 05/12/2018 0853   EKG is normal with heart rate of 79, QT interval not prolonged.  EKG unchanged from the one done in December.  ASSESSMENT AND PLAN: 1. Essential hypertension Not at goal but improved on repeat blood pressure check today.  He reports good home blood pressure readings.  I will have him follow-up with the clinical pharmacist in about 2 weeks.  Advised to bring his blood pressure readings and his blood pressure device with him to that visit.  Continue current medications which include Norvasc 10 mg daily, hydralazine 10 mg twice a day and  lisinopril/hydrochlorothiazide 20/25 daily.  2. Tobacco dependence Patient advised to quit smoking. Discussed health risks associated with smoking including lung and other types of cancers, chronic lung diseases and CV risks.. Pt ready to give trail of quitting.   Discussed methods to help quit including quitting cold [place], use of NRT, Chantix and Bupropion.  Patient wanting to try Chantix.  We will start him off at the dose for the initiation pack which she will do for 7 days then increase the dose to the continuation pack.  If he develops any adverse effects including rash, advised to stop the medication and give me a call.  5 minutes spent on counseling.  Patient will follow up with me again in about 5 weeks to assess how he is doing.  - varenicline (CHANTIX) 0.5 MG tablet; 1 tab PO daily x 3 days then increase to 1 tab BID x 4 days  Dispense: 11 tablet; Refill: 0 - varenicline (CHANTIX) 1 MG tablet; Take 1 tablet (1 mg total) by mouth 2 (two) times daily. Start once 0.5 mg dose completed  Dispense: 60 tablet; Refill: 1  3. Prediabetes 4. Obesity (BMI 35.0-39.9 without comorbidity) Commended him on the changes that he has made so far in his eating habits.  However his weight continues to increase.  I specifically recommends that he cut out sugary drinks from the diet including sodas, juices and sweet tea, cut back on portion sizes especially of white carbohydrates, eat more lean white meat like chicken [place] and seafood rather than beef or pork and to incorporate fresh fruits and vegetables into his diet.  6.  Patient on methadone maintenance therapy -no significant QT prolongation on EKG Patient was given the opportunity to ask questions.  Patient verbalized understanding of the plan and was able to repeat key elements of the plan.   No orders of the defined types were placed in this encounter.    Requested Prescriptions   Signed Prescriptions Disp Refills  . varenicline (CHANTIX)  0.5 MG tablet 11 tablet 0    Sig: 1 tab PO daily x 3 days then increase to 1 tab BID x 4 days  . varenicline (CHANTIX) 1 MG tablet 60 tablet 1    Sig: Take 1 tablet (1 mg total) by mouth 2 (two) times daily. Start once 0.5 mg dose completed    Return in about 5 weeks (around 09/07/2020) for Give appt with [facility] in 1 wk for recheck BP.  Jonah Blue, MD, FACP

## 2020-08-03 NOTE — Progress Notes (Unsigned)
Pt is wanting to speak with provider about chantix

## 2020-08-03 NOTE — Progress Notes (Incomplete)
Patient ID: Michael [name], male    DOB: 08-26-1960  MRN: [id number]  CC: Chronic disease management  Subjective: Michael [name] is a 60 y.o. male who presents for chronic ds management His concerns today include:  Pt with hx ofHTN,HL,preDM, obesity, OA LT hip and lumbar spine,Hep C treated with interferon/rib combo in [place] 2005, tobacco abuse, substance use disorderwas onMethadone in past  Since last visit with me, patient has had the left total hip replacement.  He states that the hip is feeling a lot better.  He is now being scheduled for revision of the knee replacement for the left knee.  It was scheduled for next week but procedure canceled as patient started smoking again 2 weeks after his hip replacement surgery.  He needs to be tobacco free for at least 6 weeks prior to surgery on the left knee.  He has smoked since he was in his 15s.  One time he had quit for 1 full year but he was in jail at that time.   He is requesting to be placed on Chantix to help with smoking cessation.  Dates that the nicotine gum does not decrease his cravings significantly.  Reports being on Chantix in the past but had quit it after about 1 to 2 weeks because he had developed a facial rash.  However in hindsight he feels that the rash was not due to Chantix.  During that time he was buying drugs off the street and drinking heavily and thinks that played a role.  He does recall that when he took the Chantix for 2 weeks it did decrease his cravings for cigarettes significantly.  He would like to have the Chantix removed from his allergy list so that he can give it another try. He is still on methadone 60 mg daily through [employer]. He will need another preoperative exam for the revision to his left knee.  Obesity/PreDM:  Eating a lot greens and fish, fruits. Never been a sweet eater.  Cut back to one 12 oz can Coke a day.  Not able to do much in terms of exercise due to the issue that he has with  his left knee.  It feels like the knee slips when he walks.  HTN:  Checks BP with arm cuff.  Gives home blood pressure range in 120s/70s.  Blood pressure yesterday however was 132/83.  Compliant with meds and took them already for the morning.   HL: Taking and tolerating Lipitor Patient Active Problem List   Diagnosis Date Noted  . Prediabetes 05/03/2020  . Osteoarthritis of left hip 12/10/2019  . 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 Status post revision of total knee replacement, 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  . Unilateral primary osteoarthritis, left hip 09/08/2018  . S/P revision of total knee, left 06/15/2018  . Positive depression screening 05/22/2017  . Hepatitis C virus infection cured after antiviral drug therapy 01/19/2017  . Hyperlipidemia 10/23/2016  . Hypertension 10/18/2016  . Tobacco dependence 10/18/2016  . 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 Right rotator cuff tear 01/29/2013     Current Outpatient Medications on File Prior to Visit  Medication Sig Dispense Refill  . amLODipine (NORVASC) 10 MG tablet TAKE 1 TABLET BY MOUTH EVERY DAY (Patient taking differently: Take 10 mg by mouth daily.) 90 tablet 0  . atorvastatin (LIPITOR) 20 MG tablet Take 1 tablet (20 mg total) by mouth daily. 90 tablet 1  . hydrALAZINE (  APRESOLINE) 10 MG tablet TAKE 1 TABLET BY MOUTH TWICE A DAY (Patient taking differently: Take 10 mg by mouth 2 (two) times daily.) 180 tablet 0  . lisinopril-hydrochlorothiazide (ZESTORETIC) 20-25 MG tablet TAKE 1 TABLET BY MOUTH EVERY DAY (Patient taking differently: Take 1 tablet by mouth daily.) 90 tablet 1  . methadone (DOLOPHINE) 10 MG tablet Take 3 tablets (30 mg total) by mouth every 8 (eight) hours. (Patient taking differently: Take 60 mg by mouth every 8 (eight) hours.) 30 tablet 0  . nicotine polacrilex (NICORETTE) 2 MG gum TAKE 1 EACH (2 MG TOTAL) BY MOUTH AS NEEDED FOR SMOKING CESSATION. MAX 30 PIECES/DAY 100 each 0   . psyllium (METAMUCIL) 58.6 % packet Take 1 packet by mouth daily.    [name] aspirin 81 MG chewable tablet Chew 81 mg by mouth 2 (two) times daily. (Patient not taking: Reported on 08/03/2020)    . ondansetron (ZOFRAN) 4 MG tablet Take 1 tablet (4 mg total) by mouth every 8 (eight) hours as needed for nausea or vomiting. (Patient not taking: No sig reported) 20 tablet 0  . [DISCONTINUED] carvedilol (COREG) 6.25 MG tablet Take 1 tablet (6.25 mg total) by mouth 2 (two) times daily with a meal. 60 tablet 3   No current facility-administered medications on file prior to visit.    No Active Allergies  Social History   Socioeconomic History  . Marital status: Single    Spouse name: Not on file  . Number of children: 0  . Years of education: 80  . Highest education level: Not on file  Occupational History  . Occupation: Chartered certified accountant  Tobacco Use  . Smoking status: Former Smoker    Packs/day: 1.00    Years: 37.00    Pack years: 37.00    Types: Cigarettes    Quit date: 04/20/2020    Years since quitting: 0.2  . Smokeless tobacco: Never Used  Vaping Use  . Vaping Use: Never used  Substance and Sexual Activity  . Alcohol use: Not Currently  . Drug use: No    Comment: hx of marijuana and cocaine, LSD  . Sexual activity: Yes    Partners: Female    Birth control/protection: None  Other Topics Concern  . Not on file  Social History Narrative  . Not on file   Social Determinants of Health   Financial Resource Strain: Not on file  Food Insecurity: Not on file  Transportation Needs: Not on file  Physical Activity: Not on file  Stress: Not on file  Social Connections: Not on file  Intimate Partner Violence: Not on file    Family History  Problem Relation Age of Onset  . CAD Mother   . Deep vein thrombosis Brother     Past Surgical History:  Procedure Laterality Date  . COLONOSCOPY    . left knee arthroscopy     . left rotator cuff surgery     . REVISION TOTAL KNEE ARTHROPLASTY  Left 06/15/2018  . SHOULDER OPEN ROTATOR CUFF REPAIR Right 01/29/2013   Procedure: RIGHT SHOULDER MINI ROTATOR CUFF REPAIR;  Surgeon: Javier Docker, MD;  Location: [facility];  Service: Orthopedics;  Laterality: Right;  With ANCHORS  . TOTAL HIP ARTHROPLASTY Left 06/08/2020   Procedure: TOTAL HIP ARTHROPLASTY ANTERIOR APPROACH;  Surgeon: Samson Frederic, MD;  Location: [facility];  Service: Orthopedics;  Laterality: Left;  . TOTAL KNEE ARTHROPLASTY Left 02/12/2017  . TOTAL KNEE ARTHROPLASTY Left 02/12/2017   Procedure: LEFT TOTAL KNEE ARTHROPLASTY;  Surgeon:  Tarry Kos, MD;  Location: [facility] OR;  Service: Orthopedics;  Laterality: Left;  . TOTAL KNEE REVISION Left 06/15/2018   Procedure: LEFT TOTAL KNEE REVISION;  Surgeon: Tarry Kos, MD;  Location: [facility] OR;  Service: Orthopedics;  Laterality: Left;  . TOTAL KNEE REVISION Left 03/08/2019   Procedure: LEFT TOTAL KNEE REVISION;  Surgeon: Tarry Kos, MD;  Location: [facility] OR;  Service: Orthopedics;  Laterality: Left;    ROS: Review of Systems Negative except as stated above  PHYSICAL EXAM: BP (!) 151/91   Pulse 84   Resp 16   Wt 267 lb 12.8 oz (121.5 kg)   SpO2 96%   BMI 37.35 kg/m   Wt Readings from Last 3 Encounters:  08/03/20 267 lb 12.8 oz (121.5 kg)  06/08/20 264 lb (119.7 kg)  06/01/20 264 lb (119.7 kg)  BP 148/80  Physical Exam General appearance - alert, well appearing, obese middle-age Caucasian male and in no distress Mental status - normal mood, behavior, speech, dress, motor activity, and thought processes Neck - supple, no significant adenopathy Chest - clear to auscultation, no wheezes, rales or rhonchi, symmetric air entry Heart - normal rate, regular rhythm, normal S1, S2, no murmurs, rubs, clicks or gallops Musculoskeletal -left knee: The joint is enlarged.  No point tenderness.  He has mild discomfort with passive range of motion.  Gait is wide-based.  He walks without an assistive device. Extremities -trace lower extremity  edema CMP Latest Ref Rng & Units 06/01/2020 05/15/2020 03/30/2020  Glucose 70 - 99 mg/dL 75 94 90  BUN 6 - 20 mg/dL 40(G) 12 16  Creatinine 0.61 - 1.24 mg/dL 8.67 6.19 5.09  Sodium 135 - 145 mmol/L 138 135 137  Potassium 3.5 - 5.1 mmol/L 4.5 4.0 4.2  Chloride 98 - 111 mmol/L 100 97(L) 96  CO2 22 - 32 mmol/L 28 26 25   Calcium 8.9 - 10.3 mg/dL 9.6 9.5 9.6  Total Protein 6.5 - 8.1 g/dL 7.9 7.9 7.3  Total Bilirubin 0.3 - 1.2 mg/dL 0.6 0.6 0.3  Alkaline Phos 38 - 126 U/L 76 81 111  AST 15 - 41 U/L 23 20 19   ALT 0 - 44 U/L 22 24 22    Lipid Panel     Component Value Date/Time   CHOL 197 03/30/2020 1456   TRIG 272 (H) 03/30/2020 1456   HDL 52 03/30/2020 1456   CHOLHDL 3.8 03/30/2020 1456   LDLCALC 99 03/30/2020 1456    CBC    Component Value Date/Time   WBC 8.1 06/01/2020 0826   RBC 4.77 06/01/2020 0826   HGB 14.5 06/01/2020 0826   HGB 15.1 03/30/2020 1456   HCT 42.9 06/01/2020 0826   HCT 45.4 03/30/2020 1456   PLT 299 06/01/2020 0826   PLT 339 03/30/2020 1456   MCV 89.9 06/01/2020 0826   MCV 90 03/30/2020 1456   MCH 30.4 06/01/2020 0826   MCHC 33.8 06/01/2020 0826   RDW 12.9 06/01/2020 0826   RDW 12.8 03/30/2020 1456   LYMPHSABS 1.7 03/02/2019 0927   LYMPHSABS 1.4 05/12/2018 0853   MONOABS 0.7 03/02/2019 0927   EOSABS 0.2 03/02/2019 0927   EOSABS 0.2 05/12/2018 0853   BASOSABS 0.1 03/02/2019 0927   BASOSABS 0.0 05/12/2018 0853    ASSESSMENT AND PLAN: 1. Essential hypertension Not at goal but improved on repeat blood pressure check today.  He reports good home blood pressure readings.  I will have him follow-up with the clinical pharmacist in about 2 weeks.  Advised to bring his blood pressure readings and his blood pressure device with him to that visit.  Continue current medications  2. Tobacco dependence *** - varenicline (CHANTIX) 0.5 MG tablet; 1 tab PO daily x 3 days then increase to 1 tab BID x 4 days  Dispense: 11 tablet; Refill: 0 - varenicline (CHANTIX) 1 MG  tablet; Take 1 tablet (1 mg total) by mouth 2 (two) times daily. Start once 0.5 mg dose completed  Dispense: 60 tablet; Refill: 1  3. Prediabetes ***  4. Obesity (BMI 35.0-39.9 without comorbidity) ***   Patient was given the opportunity to ask questions.  Patient verbalized understanding of the plan and was able to repeat key elements of the plan.   No orders of the defined types were placed in this encounter.    Requested Prescriptions   Signed Prescriptions Disp Refills  . varenicline (CHANTIX) 0.5 MG tablet 11 tablet 0    Sig: 1 tab PO daily x 3 days then increase to 1 tab BID x 4 days  . varenicline (CHANTIX) 1 MG tablet 60 tablet 1    Sig: Take 1 tablet (1 mg total) by mouth 2 (two) times daily. Start once 0.5 mg dose completed    Return in about 5 weeks (around 09/07/2020) for Give appt with [facility] in 1 wk for recheck BP.  Jonah Blue, MD, FACP

## 2020-08-04 ENCOUNTER — Encounter: Payer: Self-pay | Admitting: Internal Medicine

## 2020-08-09 ENCOUNTER — Ambulatory Visit (HOSPITAL_COMMUNITY): Admission: RE | Admit: 2020-08-09 | Payer: 59 | Source: Home / Self Care | Admitting: Orthopedic Surgery

## 2020-08-09 ENCOUNTER — Encounter (HOSPITAL_COMMUNITY): Admission: RE | Payer: Self-pay | Source: Home / Self Care

## 2020-08-09 SURGERY — TOTAL KNEE REVISION
Anesthesia: Spinal | Site: Knee | Laterality: Left

## 2020-08-17 ENCOUNTER — Other Ambulatory Visit: Payer: Self-pay

## 2020-08-17 ENCOUNTER — Encounter: Payer: Self-pay | Admitting: Pharmacist

## 2020-08-17 ENCOUNTER — Ambulatory Visit: Payer: 59 | Attending: Internal Medicine | Admitting: Pharmacist

## 2020-08-17 VITALS — BP 118/68

## 2020-08-17 DIAGNOSIS — I10 Essential (primary) hypertension: Secondary | ICD-10-CM

## 2020-08-17 NOTE — Progress Notes (Signed)
   S:    PCP: Dr. Laural Benes  Patient arrives in good spirits. Presents to the clinic for hypertension evaluation, counseling, and management. Patient was referred and last seen by Primary Care Provider on 08/03/2020. Of note, BP at that visit was elevated.  Also, generic Chantix was started for aid with smoking cessation.   Medication adherence reported. He has taken his medication today.   Patient denies chest pain, dyspnea, HA or blurred vision  Current BP Medications include:  Amlodipine 10 mg daily, hydralazine 10 mg BID, Zestoretic 20-25 daily  Dietary habits include: compliant with salt restriction; denies excessive caffeine intake Exercise habits include: mainly active at work Family / Social history:  - FHx: CAD, DVT - Tobacco: has not smoked since starting Chantix - Alcohol: none currently    O:  Vitals:   08/17/20 0948  BP: 118/68    Home BP readings:  - Range from this week using a wrist cuff:  - SBPs: 129 - 137 - DBPs: 78 - 85   Last 3 Office BP readings: BP Readings from Last 3 Encounters:  08/17/20 118/68  08/04/20 (!) 148/80  06/08/20 (!) 154/84   BMET    Component Value Date/Time   NA 138 06/01/2020 0826   NA 137 03/30/2020 1456   K 4.5 06/01/2020 0826   CL 100 06/01/2020 0826   CO2 28 06/01/2020 0826   GLUCOSE 75 06/01/2020 0826   BUN 21 (H) 06/01/2020 0826   BUN 16 03/30/2020 1456   CREATININE 0.90 06/01/2020 0826   CALCIUM 9.6 06/01/2020 0826   GFRNONAA >60 06/01/2020 0826   GFRAA 114 03/30/2020 1456   Renal function: CrCl cannot be calculated (Patient's most recent lab result is older than the maximum 21 days allowed.).  Clinical ASCVD: No  The 10-year ASCVD risk score Denman George DC Jr., et al., 2013) is: 7.1%   Values used to calculate the score:     Age: 60 years     Sex: Male     Is Non-Hispanic African American: No     Diabetic: No     Tobacco smoker: No     Systolic Blood Pressure: 118 mmHg     Is BP treated: Yes     HDL Cholesterol:  52 mg/dL     Total Cholesterol: 197 mg/dL   A/P: Hypertension longstanding currently at goal on current medications. BP Goal = < 130/80 mmHg. Medication adherence reported. Of note, I took his BP manually and found it to be 118/68 mmHg. Two minutes later, pt took his BP using his wrist cuff and got 129/79. I explained that this amount of variance can be expected between a wrist cuff and brachial measurement. Given these readings, will hold off on any med changes today.  -Continued current BP regimen.  -Counseled on lifestyle modifications for blood pressure control including reduced dietary sodium, increased exercise, adequate sleep.  Results reviewed and written information provided.   Total time in face-to-face counseling 15 minutes.   F/U Clinic Visit 09/07/2020 with Dr. Laural Benes.  Butch Penny, PharmD, P[place]y [place], CPP Clinical Pharmacist [facility] [phone]

## 2020-08-26 ENCOUNTER — Other Ambulatory Visit: Payer: Self-pay | Admitting: Internal Medicine

## 2020-08-26 DIAGNOSIS — F17200 Nicotine dependence, unspecified, uncomplicated: Secondary | ICD-10-CM

## 2020-09-04 ENCOUNTER — Other Ambulatory Visit: Payer: Self-pay | Admitting: Internal Medicine

## 2020-09-04 DIAGNOSIS — I10 Essential (primary) hypertension: Secondary | ICD-10-CM

## 2020-09-06 ENCOUNTER — Other Ambulatory Visit: Payer: Self-pay | Admitting: Physician Assistant

## 2020-09-06 DIAGNOSIS — E785 Hyperlipidemia, unspecified: Secondary | ICD-10-CM

## 2020-09-06 NOTE — Telephone Encounter (Signed)
  Notes to clinic:  Patient has appointment tomorrow    Requested Prescriptions  Pending Prescriptions Disp Refills   atorvastatin (LIPITOR) 20 MG tablet [Pharmacy Med Name: ATORVASTATIN 20 MG TABLET] 90 tablet 1    Sig: TAKE 1 TABLET BY MOUTH EVERY DAY      Cardiovascular:  Antilipid - Statins Failed - 09/06/2020  4:49 PM      Failed - LDL in normal range and within 360 days    LDL Chol Calc (NIH)  Date Value Ref Range Status  03/30/2020 99 0 - 99 mg/dL Final          Failed - Triglycerides in normal range and within 360 days    Triglycerides  Date Value Ref Range Status  03/30/2020 272 (H) 0 - 149 mg/dL Final          Passed - Total Cholesterol in normal range and within 360 days    Cholesterol, Total  Date Value Ref Range Status  03/30/2020 197 100 - 199 mg/dL Final          Passed - HDL in normal range and within 360 days    HDL  Date Value Ref Range Status  03/30/2020 52 >39 mg/dL Final          Passed - Patient is not pregnant      Passed - Valid encounter within last 12 months    Recent Outpatient Visits           2 weeks ago Essential hypertension   [facility] Lois Huxley, Cornelius Moras, RPH-CPP   1 month ago Essential hypertension   [facility] Marcine Matar, MD   4 months ago Pre-operative clearance   [facility] Marcine Matar, MD   5 months ago Encounter for preoperative assessment   [facility] Marcine Matar, MD   8 months ago Hyperlipidemia, unspecified hyperlipidemia type   [facility] [place], Marzella Schlein, [place]       Future Appointments             Tomorrow Marcine Matar, MD [facility]

## 2020-09-07 ENCOUNTER — Encounter: Payer: Self-pay | Admitting: Internal Medicine

## 2020-09-07 ENCOUNTER — Ambulatory Visit: Payer: 59 | Attending: Internal Medicine | Admitting: Internal Medicine

## 2020-09-07 ENCOUNTER — Other Ambulatory Visit: Payer: Self-pay

## 2020-09-07 VITALS — BP 129/79 | HR 80 | Resp 18 | Ht 71.0 in | Wt 264.6 lb

## 2020-09-07 DIAGNOSIS — Z01818 Encounter for other preprocedural examination: Secondary | ICD-10-CM | POA: Diagnosis not present

## 2020-09-07 DIAGNOSIS — F17200 Nicotine dependence, unspecified, uncomplicated: Secondary | ICD-10-CM

## 2020-09-07 DIAGNOSIS — I10 Essential (primary) hypertension: Secondary | ICD-10-CM | POA: Diagnosis not present

## 2020-09-07 NOTE — Patient Instructions (Signed)
We can continue the maintenance dose of Chantix for several months to help decrease your cravings.  Please remain tobacco free as discussed today.

## 2020-09-07 NOTE — Progress Notes (Signed)
Patient ID: Michael [name], male    DOB: November 02, 1960  MRN: [id number]  CC: Pre-op clearance  Subjective: Michael [name] is a 60 y.o. male who presents for 4-6 wks  His concerns today include:  Pt with hx ofHTN,HL,preDM, obesity,OA LT hip and lumbar spine,Hep C treated with interferon/rib combo in [place] 2005, tobacco abuse, substance use disorder onMethadone   Patient seen a month ago.  He was to have revision of left total knee replacement but it had to be canceled because patient had started smoking again.  He needs to be tobacco free for 4 to 6 weeks prior to surgery.  Patient wanted to be placed on Chantix because he felt it would work better for him than nicotine replacement products.  Started on Chantix and so far has done well.  He quit smoking 08/05/2020.  He feels he would be able to stick with smoking cessation at this time. -He is ready to have his knee surgery.  Reports a lot of discomfort from the hardware that is in place.  Able to walk only about 100 yards before having to sit down. -No problems waking up from anesthesia in the past. -EKG done on last visit was okay.  HTN:  Reports BP has been good.  Taking blood pressure medications as prescribed Checks 3x/day.  Gives range 120s/70s.  No chest pains or shortness of breath at rest or on exertion at this time.  Intermittent lower extremity edema but none at this time.   Patient Active Problem List   Diagnosis Date Noted  . Prediabetes 05/03/2020  . Osteoarthritis of left hip 12/10/2019  . 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 Status post revision of total knee replacement, 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  . Unilateral primary osteoarthritis, left hip 09/08/2018  . S/P revision of total knee, left 06/15/2018  . Positive depression screening 05/22/2017  . Hepatitis C virus infection cured after antiviral drug therapy 01/19/2017  . Hyperlipidemia 10/23/2016  . Hypertension 10/18/2016  . Tobacco  dependence 10/18/2016  . 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 Right rotator cuff tear 01/29/2013     Current Outpatient Medications on File Prior to Visit  Medication Sig Dispense Refill  . amLODipine (NORVASC) 10 MG tablet TAKE 1 TABLET BY MOUTH EVERY DAY 90 tablet 0  . atorvastatin (LIPITOR) 20 MG tablet TAKE 1 TABLET BY MOUTH EVERY DAY 90 tablet 1  . hydrALAZINE (APRESOLINE) 10 MG tablet TAKE 1 TABLET BY MOUTH TWICE A DAY (Patient taking differently: Take 10 mg by mouth 2 (two) times daily.) 180 tablet 0  . lisinopril-hydrochlorothiazide (ZESTORETIC) 20-25 MG tablet TAKE 1 TABLET BY MOUTH EVERY DAY (Patient taking differently: Take 1 tablet by mouth daily.) 90 tablet 1  . methadone (DOLOPHINE) 10 MG tablet Take 3 tablets (30 mg total) by mouth every 8 (eight) hours. (Patient taking differently: Take 60 mg by mouth every 8 (eight) hours.) 30 tablet 0  . ondansetron (ZOFRAN) 4 MG tablet Take 1 tablet (4 mg total) by mouth every 8 (eight) hours as needed for nausea or vomiting. 20 tablet 0  . psyllium (METAMUCIL) 58.6 % packet Take 1 packet by mouth daily.    . varenicline (CHANTIX) 0.5 MG tablet 1 tab PO daily x 3 days then increase to 1 tab BID x 4 days 11 tablet 0  . varenicline (CHANTIX) 1 MG tablet Take 1 tablet (1 mg total) by mouth 2 (two) times daily. Start once 0.5 mg dose  completed 60 tablet 1  . [DISCONTINUED] carvedilol (COREG) 6.25 MG tablet Take 1 tablet (6.25 mg total) by mouth 2 (two) times daily with a meal. 60 tablet 3   No current facility-administered medications on file prior to visit.    No Active Allergies  Social History   Socioeconomic History  . Marital status: Single    Spouse name: Not on file  . Number of children: 0  . Years of education: 4412  . Highest education level: Not on file  Occupational History  . Occupation: Chartered certified accountantmachinist  Tobacco Use  . Smoking status: Former Smoker    Packs/day: 1.00    Years: 37.00    Pack years: 37.00    Types:  Cigarettes    Quit date: 04/20/2020    Years since quitting: 0.3  . Smokeless tobacco: Never Used  Vaping Use  . Vaping Use: Never used  Substance and Sexual Activity  . Alcohol use: Not Currently  . Drug use: No    Comment: hx of marijuana and cocaine, LSD  . Sexual activity: Yes    Partners: Female    Birth control/protection: None  Other Topics Concern  . Not on file  Social History Narrative  . Not on file   Social Determinants of Health   Financial Resource Strain: Not on file  Food Insecurity: Not on file  Transportation Needs: Not on file  Physical Activity: Not on file  Stress: Not on file  Social Connections: Not on file  Intimate Partner Violence: Not on file    Family History  Problem Relation Age of Onset  . CAD Mother   . Deep vein thrombosis Brother     Past Surgical History:  Procedure Laterality Date  . COLONOSCOPY    . left knee arthroscopy     . left rotator cuff surgery     . REVISION TOTAL KNEE ARTHROPLASTY Left 06/15/2018  . SHOULDER OPEN ROTATOR CUFF REPAIR Right 01/29/2013   Procedure: RIGHT SHOULDER MINI ROTATOR CUFF REPAIR;  Surgeon: Javier DockerJeffrey C Beane, MD;  Location: [facility];  Service: Orthopedics;  Laterality: Right;  With ANCHORS  . TOTAL HIP ARTHROPLASTY Left 06/08/2020   Procedure: TOTAL HIP ARTHROPLASTY ANTERIOR APPROACH;  Surgeon: Samson FredericSwinteck, Brian, MD;  Location: [facility];  Service: Orthopedics;  Laterality: Left;  . TOTAL KNEE ARTHROPLASTY Left 02/12/2017  . TOTAL KNEE ARTHROPLASTY Left 02/12/2017   Procedure: LEFT TOTAL KNEE ARTHROPLASTY;  Surgeon: Tarry KosXu, Naiping M, MD;  Location: [facility];  Service: Orthopedics;  Laterality: Left;  . TOTAL KNEE REVISION Left 06/15/2018   Procedure: LEFT TOTAL KNEE REVISION;  Surgeon: Tarry KosXu, Naiping M, MD;  Location: [facility];  Service: Orthopedics;  Laterality: Left;  . TOTAL KNEE REVISION Left 03/08/2019   Procedure: LEFT TOTAL KNEE REVISION;  Surgeon: Tarry KosXu, Naiping M, MD;  Location: [facility];  Service: Orthopedics;   Laterality: Left;    ROS: Review of Systems Negative except as stated above  PHYSICAL EXAM: BP 129/79   Pulse 80   Resp 18   Ht 5\' 11"  (1.803 m)   Wt 264 lb 9.6 oz (120 kg)   SpO2 95%   BMI 36.90 kg/m   Physical Exam  General appearance - alert, well appearing, and in no distress Mental status - normal mood, behavior, speech, dress, motor activity, and thought processes Eyes - pupils equal and reactive, extraocular eye movements intact Mouth - mucous membranes moist, pharynx normal without lesions Neck - supple, no significant adenopathy Chest - clear to auscultation, no  wheezes, rales or rhonchi, symmetric air entry Heart - normal rate, regular rhythm, normal S1, S2, no murmurs, rubs, clicks or gallops Musculoskeletal -left knee: Healed scar from previous surgery.  Moderate discomfort with passive range of motion. Extremities -no lower extremity edema. CMP Latest Ref Rng & Units 06/01/2020 05/15/2020 03/30/2020  Glucose 70 - 99 mg/dL 75 94 90  BUN 6 - 20 mg/dL 93(Z) 12 16  Creatinine 0.61 - 1.24 mg/dL 1.69 6.78 9.38  Sodium 135 - 145 mmol/L 138 135 137  Potassium 3.5 - 5.1 mmol/L 4.5 4.0 4.2  Chloride 98 - 111 mmol/L 100 97(L) 96  CO2 22 - 32 mmol/L 28 26 25   Calcium 8.9 - 10.3 mg/dL 9.6 9.5 9.6  Total Protein 6.5 - 8.1 g/dL 7.9 7.9 7.3  Total Bilirubin 0.3 - 1.2 mg/dL 0.6 0.6 0.3  Alkaline Phos 38 - 126 U/L 76 81 111  AST 15 - 41 U/L 23 20 19   ALT 0 - 44 U/L 22 24 22    Lipid Panel     Component Value Date/Time   CHOL 197 03/30/2020 1456   TRIG 272 (H) 03/30/2020 1456   HDL 52 03/30/2020 1456   CHOLHDL 3.8 03/30/2020 1456   LDLCALC 99 03/30/2020 1456    CBC    Component Value Date/Time   WBC 8.2 09/07/2020 1438   WBC 8.1 06/01/2020 0826   RBC 4.63 09/07/2020 1438   RBC 4.77 06/01/2020 0826   HGB 13.3 09/07/2020 1438   HCT 40.4 09/07/2020 1438   PLT 306 09/07/2020 1438   MCV 87 09/07/2020 1438   MCH 28.7 09/07/2020 1438   MCH 30.4 06/01/2020 0826   MCHC  32.9 09/07/2020 1438   MCHC 33.8 06/01/2020 0826   RDW 14.1 09/07/2020 1438   LYMPHSABS 1.7 03/02/2019 0927   LYMPHSABS 1.4 05/12/2018 0853   MONOABS 0.7 03/02/2019 0927   EOSABS 0.2 03/02/2019 0927   EOSABS 0.2 05/12/2018 0853   BASOSABS 0.1 03/02/2019 0927   BASOSABS 0.0 05/12/2018 0853   Lab Results  Component Value Date   HGBA1C 5.6 06/01/2020   Results for orders placed or performed in visit on 09/07/20  Urinalysis  Result Value Ref Range   Specific Gravity, UA 1.014 1.005 - 1.030   pH, UA 6.0 5.0 - 7.5   Color, UA Yellow Yellow   Appearance Ur Clear Clear   Leukocytes,UA Negative Negative   Protein,UA Negative Negative/Trace   Glucose, UA Negative Negative   Ketones, UA Negative Negative   RBC, UA Negative Negative   Bilirubin, UA Negative Negative   Urobilinogen, Ur 0.2 0.2 - 1.0 mg/dL   Nitrite, UA Negative Negative  Protime-INR  Result Value Ref Range   INR 1.0 0.9 - 1.2   Prothrombin Time 10.0 9.1 - 12.0 sec  CBC  Result Value Ref Range   WBC 8.2 3.4 - 10.8 x10E3/uL   RBC 4.63 4.14 - 5.80 x10E6/uL   Hemoglobin 13.3 13.0 - 17.7 g/dL   Hematocrit 05/14/2018 07/30/2020 - 51.0 %   MCV 87 79 - 97 fL   MCH 28.7 26.6 - 33.0 pg   MCHC 32.9 31.5 - 35.7 g/dL   RDW 09/09/20 10.1 - 75.1 %   Platelets 306 150 - 450 x10E3/uL     ASSESSMENT AND PLAN: 1. Encounter for preoperative assessment Patient does have factors that put him at risk for anesthesia will include essential hypertension, tobacco dependence, obesity and decreased activity level.  However he has had several surgeries in the  past 2 to 3 years and has not had any cardiovascular complications perioperatively or problems waking up from anesthesia.  Okay to proceed with surgery at this time. - Urinalysis - Protime-INR - CBC  2. Essential hypertension Controlled.  Continue current medications.  3. Tobacco dependence Commended him on quitting.  Encouraged him to remain tobacco free.  Patient advised that we can extend  the continuation pack of Chantix for a few months as he continues to try to remain tobacco free.    Patient was given the opportunity to ask questions.  Patient verbalized understanding of the plan and was able to repeat key elements of the plan.   Orders Placed This Encounter  Procedures  . Urinalysis  . Protime-INR  . CBC     Requested Prescriptions    No prescriptions requested or ordered in this encounter    Return in about 4 months (around 01/07/2021).  Jonah Blue, MD, FACP

## 2020-09-08 LAB — CBC
Hematocrit: 40.4 % (ref 37.5–51.0)
Hemoglobin: 13.3 g/dL (ref 13.0–17.7)
MCH: 28.7 pg (ref 26.6–33.0)
MCHC: 32.9 g/dL (ref 31.5–35.7)
MCV: 87 fL (ref 79–97)
Platelets: 306 10*3/uL (ref 150–450)
RBC: 4.63 x10E6/uL (ref 4.14–5.80)
RDW: 14.1 % (ref 11.6–15.4)
WBC: 8.2 10*3/uL (ref 3.4–10.8)

## 2020-09-08 LAB — URINALYSIS
Bilirubin, UA: NEGATIVE
Glucose, UA: NEGATIVE
Ketones, UA: NEGATIVE
Leukocytes,UA: NEGATIVE
Nitrite, UA: NEGATIVE
Protein,UA: NEGATIVE
RBC, UA: NEGATIVE
Specific Gravity, UA: 1.014 (ref 1.005–1.030)
Urobilinogen, Ur: 0.2 mg/dL (ref 0.2–1.0)
pH, UA: 6 (ref 5.0–7.5)

## 2020-09-08 LAB — PROTIME-INR
INR: 1 (ref 0.9–1.2)
Prothrombin Time: 10 s (ref 9.1–12.0)

## 2020-09-11 ENCOUNTER — Ambulatory Visit: Payer: Self-pay | Admitting: Student

## 2020-09-21 NOTE — Patient Instructions (Addendum)
DUE TO COVID-19 ONLY ONE VISITOR IS ALLOWED TO COME WITH YOU AND STAY IN THE WAITING ROOM ONLY DURING PRE OP AND PROCEDURE DAY OF SURGERY. THE 1 VISITOR  MAY VISIT WITH YOU AFTER SURGERY IN YOUR PRIVATE ROOM DURING VISITING HOURS ONLY!  YOU NEED TO HAVE A COVID 19 TEST ON_5/2______ @_8 :55______, THIS TEST MUST BE DONE BEFORE SURGERY,  COVID TESTING SITE [address] [place] , IT IS ON THE RIGHT GOING OUT [address] APPROXIMATELY  2 MINUTES PAST [employer] ON THE RIGHT. ONCE YOUR COVID TEST IS COMPLETED,  PLEASE BEGIN THE QUARANTINE INSTRUCTIONS AS OUTLINED IN YOUR HANDOUT.                [id number]    Your procedure is scheduled on: 09/27/20   Report to [facility] Main  Entrance   Report to admitting at 9:10 AM     Call this number if you have problems the morning of surgery [phone]   BRUSH YOUR TEETH MORNING OF SURGERY AND RINSE YOUR MOUTH OUT, NO CHEWING GUM CANDY OR MINTS.    Take these medicines the morning of surgery with A SIP OF WATER: Methadone, Hydralazine,Amlodipine                                You may not have any metal on your body including              piercings  Do not wear jewelry,  lotions, powders or deodorant              Men may shave face and neck.   Do not bring valuables to the hospital. [facility] IS NOT             RESPONSIBLE   FOR VALUABLES.  Contacts, dentures or bridgework may not be worn into surgery.     Special Instructions: N/A              Please read over the following fact sheets you were given: _____________________________________________________________________             [facility] - Preparing for Surgery Before surgery, you can play an important role.  Because skin is not sterile, your skin needs to be as free of germs as possible.  You can reduce the number of germs on your skin by washing with CHG (chlorahexidine gluconate) soap before surgery.  CHG is an antiseptic cleaner  which kills germs and bonds with the skin to continue killing germs even after washing. Please DO NOT use if you have an allergy to CHG or antibacterial soaps.  If your skin becomes reddened/irritated stop using the CHG and inform your nurse when you arrive at Short Stay. .  You may shave your face/neck. Please follow these instructions carefully:  1.  Shower with CHG Soap the night before surgery and the  morning of Surgery.  2.  If you choose to wash your hair, wash your hair first as usual with your  normal  shampoo.  3.  After you shampoo, rinse your hair and body thoroughly to remove the  shampoo.                                        4.  Use CHG as you would any other liquid  soap.  You can apply chg directly  to the skin and wash                       Gently with a scrungie or clean washcloth.  5.  Apply the CHG Soap to your body ONLY FROM THE NECK DOWN.   Do not use on face/ open                           Wound or open sores. Avoid contact with eyes, ears mouth and genitals (private parts).                       Wash face,  Genitals (private parts) with your normal soap.             6.  Wash thoroughly, paying special attention to the area where your surgery  will be performed.  7.  Thoroughly rinse your body with warm water from the neck down.  8.  DO NOT shower/wash with your normal soap after using and rinsing off  the CHG Soap.             9.  Pat yourself dry with a clean towel.            10.  Wear clean pajamas.            11.  Place clean sheets on your bed the night of your first shower and do not  sleep with pets. Day of Surgery : Do not apply any lotions/deodorants the morning of surgery.  Please wear clean clothes to the hospital/surgery center.  FAILURE TO FOLLOW THESE INSTRUCTIONS MAY RESULT IN THE CANCELLATION OF YOUR SURGERY PATIENT SIGNATURE_________________________________  NURSE  SIGNATURE__________________________________  ________________________________________________________________________   Michael [name]  An incentive spirometer is a tool that can help keep your lungs clear and active. This tool measures how well you are filling your lungs with each breath. Taking long deep breaths may help reverse or decrease the chance of developing breathing (pulmonary) problems (especially infection) following:  A long period of time when you are unable to move or be active. BEFORE THE PROCEDURE   If the spirometer includes an indicator to show your best effort, your nurse or respiratory therapist will set it to a desired goal.  If possible, sit up straight or lean slightly forward. Try not to slouch.  Hold the incentive spirometer in an upright position. INSTRUCTIONS FOR USE  1. Sit on the edge of your bed if possible, or sit up as far as you can in bed or on a chair. 2. Hold the incentive spirometer in an upright position. 3. Breathe out normally. 4. Place the mouthpiece in your mouth and seal your lips tightly around it. 5. Breathe in slowly and as deeply as possible, raising the piston or the ball toward the top of the column. 6. Hold your breath for 3-5 seconds or for as long as possible. Allow the piston or ball to fall to the bottom of the column. 7. Remove the mouthpiece from your mouth and breathe out normally. 8. Rest for a few seconds and repeat Steps 1 through 7 at least 10 times every 1-2 hours when you are awake. Take your time and take a few normal breaths between deep breaths. 9. The spirometer may include an indicator to show your best effort. Use the indicator as a goal to work toward during  each repetition. 10. After each set of 10 deep breaths, practice coughing to be sure your lungs are clear. If you have an incision (the cut made at the time of surgery), support your incision when coughing by placing a pillow or rolled up towels firmly  against it. Once you are able to get out of bed, walk around indoors and cough well. You may stop using the incentive spirometer when instructed by your caregiver.  RISKS AND COMPLICATIONS  Take your time so you do not get dizzy or light-headed.  If you are in pain, you may need to take or ask for pain medication before doing incentive spirometry. It is harder to take a deep breath if you are having pain. AFTER USE  Rest and breathe slowly and easily.  It can be helpful to keep track of a log of your progress. Your caregiver can provide you with a simple table to help with this. If you are using the spirometer at home, follow these instructions: [place] IF:   You are having difficultly using the spirometer.  You have trouble using the spirometer as often as instructed.  Your pain medication is not giving enough relief while using the spirometer.  You develop fever of 100.5 F (38.1 C) or higher. SEEK IMMEDIATE MEDICAL CARE IF:   You cough up bloody sputum that had not been present before.  You develop fever of 102 F (38.9 C) or greater.  You develop worsening pain at or near the incision site. MAKE SURE YOU:   Understand these instructions.  Will watch your condition.  Will get help right away if you are not doing well or get worse. Document Released: 09/23/2006 Document Revised: 08/05/2011 Document Reviewed: 11/24/2006 [facility] Patient Information 2014 [place], [place].   ________________________________________________________________________

## 2020-09-22 ENCOUNTER — Encounter (HOSPITAL_COMMUNITY): Payer: Self-pay

## 2020-09-22 ENCOUNTER — Other Ambulatory Visit: Payer: Self-pay

## 2020-09-22 ENCOUNTER — Encounter (HOSPITAL_COMMUNITY)
Admission: RE | Admit: 2020-09-22 | Discharge: 2020-09-22 | Disposition: A | Discharge status: Home / Self Care | Payer: 59 | Source: Ambulatory Visit | Attending: Orthopedic Surgery | Admitting: Orthopedic Surgery

## 2020-09-22 DIAGNOSIS — Z01812 Encounter for preprocedural laboratory examination: Secondary | ICD-10-CM | POA: Insufficient documentation

## 2020-09-22 LAB — CBC
HCT: 40.3 % (ref 39.0–52.0)
Hemoglobin: 13.5 g/dL (ref 13.0–17.0)
MCH: 29 pg (ref 26.0–34.0)
MCHC: 33.5 g/dL (ref 30.0–36.0)
MCV: 86.7 fL (ref 80.0–100.0)
Platelets: 265 10*3/uL (ref 150–400)
RBC: 4.65 MIL/uL (ref 4.22–5.81)
RDW: 14 % (ref 11.5–15.5)
WBC: 5 10*3/uL (ref 4.0–10.5)
nRBC: 0 % (ref 0.0–0.2)

## 2020-09-22 LAB — HEMOGLOBIN A1C
Hgb A1c MFr Bld: 5.8 % — ABNORMAL HIGH (ref 4.8–5.6)
Mean Plasma Glucose: 119.76 mg/dL

## 2020-09-22 LAB — COMPREHENSIVE METABOLIC PANEL
ALT: 22 U/L (ref 0–44)
AST: 21 U/L (ref 15–41)
Albumin: 4.3 g/dL (ref 3.5–5.0)
Alkaline Phosphatase: 77 U/L (ref 38–126)
Anion gap: 12 (ref 5–15)
BUN: 17 mg/dL (ref 6–20)
CO2: 25 mmol/L (ref 22–32)
Calcium: 9.4 mg/dL (ref 8.9–10.3)
Chloride: 104 mmol/L (ref 98–111)
Creatinine, Ser: 0.76 mg/dL (ref 0.61–1.24)
GFR, Estimated: >60 mL/min (ref >60)
Glucose, Bld: 90 mg/dL (ref 70–99)
Potassium: 3.8 mmol/L (ref 3.5–5.1)
Sodium: 141 mmol/L (ref 135–145)
Total Bilirubin: 0.2 mg/dL — ABNORMAL LOW (ref 0.3–1.2)
Total Protein: 7.2 g/dL (ref 6.5–8.1)

## 2020-09-22 LAB — URINALYSIS, ROUTINE W REFLEX MICROSCOPIC
Bacteria, UA: NONE SEEN
Bilirubin Urine: NEGATIVE
Glucose, UA: NEGATIVE mg/dL
Ketones, ur: NEGATIVE mg/dL
Leukocytes,Ua: NEGATIVE
Nitrite: NEGATIVE
Protein, ur: NEGATIVE mg/dL
Specific Gravity, Urine: 1.013 (ref 1.005–1.030)
pH: 5 (ref 5.0–8.0)

## 2020-09-22 LAB — SURGICAL PCR SCREEN
MRSA, PCR: NEGATIVE
Staphylococcus aureus: NEGATIVE

## 2020-09-22 LAB — PROTIME-INR
INR: 1 (ref 0.8–1.2)
Prothrombin Time: 12.7 seconds (ref 11.4–15.2)

## 2020-09-22 NOTE — Progress Notes (Signed)
COVID Vaccine Completed:Yes Date COVID Vaccine completed:08/2019-booster 10.2021 COVID vaccine manufacturer: Pfizer      PCP - Dr. Timoteo Expose Cardiologist -  none  Chest x-ray - no EKG - 08/03/20-epic Stress Test - no ECHO -no  Cardiac Cath - no Pacemaker/ICD device last checked:NA  Sleep Study - no CPAP -   Fasting Blood Sugar - NA Checks Blood Sugar _____ times a day  Blood Thinner Instructions:NA Aspirin Instructions: Last Dose:  Anesthesia review:   Patient denies shortness of breath, fever, cough and chest pain at PAT appointment Yes. Pt reports no SOB with activity but he has gained weight and is out of shape.  Patient verbalized understanding of instructions that were given to them at the PAT appointment. Patient was also instructed that they will need to review over the PAT instructions again at home before surgery.Yes

## 2020-09-25 ENCOUNTER — Other Ambulatory Visit (HOSPITAL_COMMUNITY)
Admission: RE | Admit: 2020-09-25 | Discharge: 2020-09-25 | Disposition: A | Discharge status: Home / Self Care | Payer: 59 | Source: Ambulatory Visit | Attending: Orthopedic Surgery | Admitting: Orthopedic Surgery

## 2020-09-25 DIAGNOSIS — Z01812 Encounter for preprocedural laboratory examination: Secondary | ICD-10-CM | POA: Insufficient documentation

## 2020-09-25 DIAGNOSIS — Z20822 Contact with and (suspected) exposure to covid-19: Secondary | ICD-10-CM | POA: Insufficient documentation

## 2020-09-26 LAB — SARS CORONAVIRUS 2 (TAT 6-24 HRS): SARS Coronavirus 2: NEGATIVE

## 2020-09-27 ENCOUNTER — Encounter (HOSPITAL_COMMUNITY)
Admission: RE | Disposition: A | Discharge status: Home / Self Care | Payer: Self-pay | Source: Home / Self Care | Attending: Orthopedic Surgery

## 2020-09-27 ENCOUNTER — Ambulatory Visit (HOSPITAL_COMMUNITY): Payer: 59

## 2020-09-27 ENCOUNTER — Ambulatory Visit (HOSPITAL_COMMUNITY): Payer: 59 | Admitting: Anesthesiology

## 2020-09-27 ENCOUNTER — Encounter (HOSPITAL_COMMUNITY): Payer: Self-pay | Admitting: Orthopedic Surgery

## 2020-09-27 ENCOUNTER — Other Ambulatory Visit: Payer: Self-pay

## 2020-09-27 ENCOUNTER — Inpatient Hospital Stay (HOSPITAL_COMMUNITY): Payer: 59

## 2020-09-27 ENCOUNTER — Inpatient Hospital Stay (HOSPITAL_COMMUNITY)
Admission: RE | Admit: 2020-09-27 | Discharge: 2020-09-28 | DRG: 468 | Disposition: A | Discharge status: Home / Self Care | Payer: 59 | Attending: Orthopedic Surgery | Admitting: Orthopedic Surgery

## 2020-09-27 DIAGNOSIS — Z7982 Long term (current) use of aspirin: Secondary | ICD-10-CM | POA: Diagnosis not present

## 2020-09-27 DIAGNOSIS — Z20822 Contact with and (suspected) exposure to covid-19: Secondary | ICD-10-CM | POA: Diagnosis present

## 2020-09-27 DIAGNOSIS — M1712 Unilateral primary osteoarthritis, left knee: Secondary | ICD-10-CM | POA: Diagnosis present

## 2020-09-27 DIAGNOSIS — E785 Hyperlipidemia, unspecified: Secondary | ICD-10-CM | POA: Diagnosis present

## 2020-09-27 DIAGNOSIS — I10 Essential (primary) hypertension: Secondary | ICD-10-CM | POA: Diagnosis present

## 2020-09-27 DIAGNOSIS — T84033A Mechanical loosening of internal left knee prosthetic joint, initial encounter: Secondary | ICD-10-CM | POA: Diagnosis present

## 2020-09-27 DIAGNOSIS — Y792 Prosthetic and other implants, materials and accessory orthopedic devices associated with adverse incidents: Secondary | ICD-10-CM | POA: Diagnosis present

## 2020-09-27 DIAGNOSIS — R7303 Prediabetes: Secondary | ICD-10-CM | POA: Diagnosis present

## 2020-09-27 DIAGNOSIS — Z87891 Personal history of nicotine dependence: Secondary | ICD-10-CM

## 2020-09-27 DIAGNOSIS — Z79899 Other long term (current) drug therapy: Secondary | ICD-10-CM

## 2020-09-27 DIAGNOSIS — Z419 Encounter for procedure for purposes other than remedying health state, unspecified: Secondary | ICD-10-CM

## 2020-09-27 HISTORY — PX: TOTAL KNEE REVISION: SHX996

## 2020-09-27 LAB — SYNOVIAL CELL COUNT + DIFF, W/ CRYSTALS
Crystals, Fluid: NONE SEEN
Lymphocytes-Synovial Fld: 36 % — ABNORMAL HIGH (ref 0–20)
Monocyte-Macrophage-Synovial Fluid: 55 % (ref 50–90)
Neutrophil, Synovial: 9 % (ref 0–25)
WBC, Synovial: 23 /mm3 (ref 0–200)

## 2020-09-27 LAB — TYPE AND SCREEN
ABO/RH(D): B POS
Antibody Screen: NEGATIVE

## 2020-09-27 SURGERY — TOTAL KNEE REVISION
Anesthesia: Regional | Site: Knee | Laterality: Left

## 2020-09-27 MED ORDER — DEXAMETHASONE SODIUM PHOSPHATE 10 MG/ML IJ SOLN
10.0000 mg | Freq: Once | INTRAMUSCULAR | Status: AC
  Administered 2020-09-28: 10 mg via INTRAVENOUS
  Filled 2020-09-27: qty 1

## 2020-09-27 MED ORDER — ACETAMINOPHEN 10 MG/ML IV SOLN
1000.0000 mg | Freq: Once | INTRAVENOUS | Status: DC
  Filled 2020-09-27: qty 100

## 2020-09-27 MED ORDER — ISOPROPYL ALCOHOL 70 % SOLN
Status: DC | PRN
  Administered 2020-09-27: 1 via TOPICAL

## 2020-09-27 MED ORDER — SODIUM CHLORIDE 0.9 % IR SOLN
Status: DC | PRN
  Administered 2020-09-27: 3000 mL

## 2020-09-27 MED ORDER — PROPOFOL 1000 MG/100ML IV EMUL
INTRAVENOUS | Status: AC
  Filled 2020-09-27: qty 100

## 2020-09-27 MED ORDER — LACTATED RINGERS IV SOLN: INTRAVENOUS | Status: DC

## 2020-09-27 MED ORDER — ALUM & MAG HYDROXIDE-SIMETH 200-200-20 MG/5ML PO SUSP: 30.0000 mL | ORAL | Status: DC | PRN

## 2020-09-27 MED ORDER — DEXAMETHASONE SODIUM PHOSPHATE 10 MG/ML IJ SOLN
INTRAMUSCULAR | Status: DC | PRN
  Administered 2020-09-27: 5 mg

## 2020-09-27 MED ORDER — POVIDONE-IODINE 10 % EX SWAB
2.0000 "application " | Freq: Once | CUTANEOUS | Status: AC
  Administered 2020-09-27: 2 via TOPICAL

## 2020-09-27 MED ORDER — TRANEXAMIC ACID-NACL 1000-0.7 MG/100ML-% IV SOLN
1000.0000 mg | INTRAVENOUS | Status: AC
  Administered 2020-09-27: 1000 mg via INTRAVENOUS
  Filled 2020-09-27: qty 100

## 2020-09-27 MED ORDER — POLYETHYLENE GLYCOL 3350 17 G PO PACK: 17.0000 g | PACK | Freq: Every day | ORAL | Status: DC | PRN

## 2020-09-27 MED ORDER — MENTHOL 3 MG MT LOZG: 1.0000 | LOZENGE | OROMUCOSAL | Status: DC | PRN

## 2020-09-27 MED ORDER — FENTANYL CITRATE (PF) 100 MCG/2ML IJ SOLN
25.0000 ug | INTRAMUSCULAR | Status: DC | PRN
  Administered 2020-09-27 (×2): 50 ug via INTRAVENOUS

## 2020-09-27 MED ORDER — CHLORHEXIDINE GLUCONATE 0.12 % MT SOLN
15.0000 mL | Freq: Once | OROMUCOSAL | Status: AC
  Administered 2020-09-27: 15 mL via OROMUCOSAL

## 2020-09-27 MED ORDER — PROPOFOL 500 MG/50ML IV EMUL
INTRAVENOUS | Status: DC | PRN
  Administered 2020-09-27: 75 ug/kg/min via INTRAVENOUS

## 2020-09-27 MED ORDER — AMLODIPINE BESYLATE 10 MG PO TABS
10.0000 mg | ORAL_TABLET | Freq: Every day | ORAL | Status: DC
  Administered 2020-09-28: 10 mg via ORAL
  Filled 2020-09-27: qty 1

## 2020-09-27 MED ORDER — METHADONE HCL 10 MG PO TABS
60.0000 mg | ORAL_TABLET | Freq: Every day | ORAL | Status: DC
  Administered 2020-09-28: 60 mg via ORAL
  Filled 2020-09-27 (×2): qty 6

## 2020-09-27 MED ORDER — HYDROCODONE-ACETAMINOPHEN 7.5-325 MG PO TABS
1.0000 | ORAL_TABLET | ORAL | Status: DC | PRN
  Administered 2020-09-27 – 2020-09-28 (×5): 2 via ORAL
  Administered 2020-09-28: 1 via ORAL
  Filled 2020-09-27 (×6): qty 2

## 2020-09-27 MED ORDER — ASPIRIN 81 MG PO CHEW
81.0000 mg | CHEWABLE_TABLET | Freq: Two times a day (BID) | ORAL | Status: DC
  Administered 2020-09-27 – 2020-09-28 (×2): 81 mg via ORAL
  Filled 2020-09-27 (×2): qty 1

## 2020-09-27 MED ORDER — ONDANSETRON HCL 4 MG/2ML IJ SOLN
INTRAMUSCULAR | Status: DC | PRN
  Administered 2020-09-27: 4 mg via INTRAVENOUS

## 2020-09-27 MED ORDER — BUPIVACAINE-EPINEPHRINE (PF) 0.25% -1:200000 IJ SOLN
INTRAMUSCULAR | Status: AC
  Filled 2020-09-27: qty 30

## 2020-09-27 MED ORDER — CEFAZOLIN SODIUM-DEXTROSE 2-4 GM/100ML-% IV SOLN
2.0000 g | Freq: Four times a day (QID) | INTRAVENOUS | Status: AC
  Administered 2020-09-27 – 2020-09-28 (×2): 2 g via INTRAVENOUS
  Filled 2020-09-27: qty 100

## 2020-09-27 MED ORDER — FENTANYL CITRATE (PF) 100 MCG/2ML IJ SOLN
INTRAMUSCULAR | Status: AC
  Filled 2020-09-27: qty 2

## 2020-09-27 MED ORDER — ROPIVACAINE HCL 5 MG/ML IJ SOLN
INTRAMUSCULAR | Status: DC | PRN
  Administered 2020-09-27: 20 mL via PERINEURAL

## 2020-09-27 MED ORDER — HYDRALAZINE HCL 10 MG PO TABS
10.0000 mg | ORAL_TABLET | Freq: Two times a day (BID) | ORAL | Status: DC
  Administered 2020-09-27 – 2020-09-28 (×2): 10 mg via ORAL
  Filled 2020-09-27 (×3): qty 1

## 2020-09-27 MED ORDER — ORAL CARE MOUTH RINSE
15.0000 mL | Freq: Once | OROMUCOSAL | Status: AC
Start: 2020-09-27 — End: 2020-09-27

## 2020-09-27 MED ORDER — SENNA 8.6 MG PO TABS
1.0000 | ORAL_TABLET | Freq: Two times a day (BID) | ORAL | Status: DC
  Administered 2020-09-27 – 2020-09-28 (×2): 8.6 mg via ORAL
  Filled 2020-09-27 (×2): qty 1

## 2020-09-27 MED ORDER — ONDANSETRON HCL 4 MG PO TABS: 4.0000 mg | ORAL_TABLET | Freq: Four times a day (QID) | ORAL | Status: DC | PRN

## 2020-09-27 MED ORDER — DEXTROSE 5 % IV SOLN
INTRAVENOUS | Status: DC | PRN
  Administered 2020-09-27: 3 g via INTRAVENOUS

## 2020-09-27 MED ORDER — ATORVASTATIN CALCIUM 20 MG PO TABS
20.0000 mg | ORAL_TABLET | Freq: Every day | ORAL | Status: DC
  Administered 2020-09-27 – 2020-09-28 (×2): 20 mg via ORAL
  Filled 2020-09-27 (×2): qty 1

## 2020-09-27 MED ORDER — CEFAZOLIN IN SODIUM CHLORIDE 3-0.9 GM/100ML-% IV SOLN
INTRAVENOUS | Status: AC
  Filled 2020-09-27: qty 100

## 2020-09-27 MED ORDER — KETOROLAC TROMETHAMINE 30 MG/ML IJ SOLN
INTRAMUSCULAR | Status: DC | PRN
  Administered 2020-09-27: 30 mg

## 2020-09-27 MED ORDER — SODIUM CHLORIDE 0.9 % IV SOLN: INTRAVENOUS | Status: DC

## 2020-09-27 MED ORDER — KETOROLAC TROMETHAMINE 30 MG/ML IJ SOLN
INTRAMUSCULAR | Status: AC
  Filled 2020-09-27: qty 1

## 2020-09-27 MED ORDER — PHENOL 1.4 % MT LIQD
1.0000 | OROMUCOSAL | Status: DC | PRN
Start: 2020-09-27 — End: 2020-09-28

## 2020-09-27 MED ORDER — DOCUSATE SODIUM 100 MG PO CAPS
100.0000 mg | ORAL_CAPSULE | Freq: Two times a day (BID) | ORAL | Status: DC
  Administered 2020-09-27 – 2020-09-28 (×2): 100 mg via ORAL
  Filled 2020-09-27 (×2): qty 1

## 2020-09-27 MED ORDER — FENTANYL CITRATE (PF) 100 MCG/2ML IJ SOLN
50.0000 ug | INTRAMUSCULAR | Status: DC
  Administered 2020-09-27: 100 ug via INTRAVENOUS
  Filled 2020-09-27: qty 2

## 2020-09-27 MED ORDER — METHOCARBAMOL 500 MG PO TABS
500.0000 mg | ORAL_TABLET | Freq: Four times a day (QID) | ORAL | Status: DC | PRN
  Administered 2020-09-28: 500 mg via ORAL
  Filled 2020-09-27: qty 1

## 2020-09-27 MED ORDER — PSYLLIUM 95 % PO PACK
1.0000 | PACK | Freq: Every day | ORAL | Status: DC
  Administered 2020-09-28: 1 via ORAL
  Filled 2020-09-27 (×2): qty 1

## 2020-09-27 MED ORDER — ONDANSETRON HCL 4 MG/2ML IJ SOLN
INTRAMUSCULAR | Status: AC
  Filled 2020-09-27: qty 2

## 2020-09-27 MED ORDER — ACETAMINOPHEN 10 MG/ML IV SOLN
1000.0000 mg | Freq: Once | INTRAVENOUS | Status: AC
  Administered 2020-09-27: 1000 mg via INTRAVENOUS

## 2020-09-27 MED ORDER — MIDAZOLAM HCL 2 MG/2ML IJ SOLN
1.0000 mg | INTRAMUSCULAR | Status: DC
  Administered 2020-09-27: 2 mg via INTRAVENOUS
  Filled 2020-09-27: qty 2

## 2020-09-27 MED ORDER — POVIDONE-IODINE 10 % EX SWAB: 2.0000 "application " | Freq: Once | CUTANEOUS | Status: DC

## 2020-09-27 MED ORDER — BUPIVACAINE IN DEXTROSE 0.75-8.25 % IT SOLN
INTRATHECAL | Status: DC | PRN
  Administered 2020-09-27: 2 mL via INTRATHECAL

## 2020-09-27 MED ORDER — PHENYLEPHRINE HCL (PRESSORS) 10 MG/ML IV SOLN
INTRAVENOUS | Status: AC
  Filled 2020-09-27: qty 1

## 2020-09-27 MED ORDER — PHENYLEPHRINE HCL-NACL 10-0.9 MG/250ML-% IV SOLN
INTRAVENOUS | Status: DC | PRN
  Administered 2020-09-27: 10 ug/min via INTRAVENOUS

## 2020-09-27 MED ORDER — CELECOXIB 200 MG PO CAPS
200.0000 mg | ORAL_CAPSULE | Freq: Two times a day (BID) | ORAL | Status: DC
  Administered 2020-09-27 – 2020-09-28 (×2): 200 mg via ORAL
  Filled 2020-09-27 (×2): qty 1

## 2020-09-27 MED ORDER — METHOCARBAMOL 1000 MG/10ML IJ SOLN
500.0000 mg | Freq: Four times a day (QID) | INTRAVENOUS | Status: DC | PRN
  Administered 2020-09-27: 500 mg via INTRAVENOUS
  Filled 2020-09-27: qty 500

## 2020-09-27 MED ORDER — ACETAMINOPHEN 325 MG PO TABS: 325.0000 mg | ORAL_TABLET | Freq: Four times a day (QID) | ORAL | Status: DC | PRN

## 2020-09-27 MED ORDER — METOCLOPRAMIDE HCL 5 MG PO TABS: 5.0000 mg | ORAL_TABLET | Freq: Three times a day (TID) | ORAL | Status: DC | PRN

## 2020-09-27 MED ORDER — HYDROCODONE-ACETAMINOPHEN 5-325 MG PO TABS: 1.0000 | ORAL_TABLET | ORAL | Status: DC | PRN

## 2020-09-27 MED ORDER — BUPIVACAINE-EPINEPHRINE 0.25% -1:200000 IJ SOLN
INTRAMUSCULAR | Status: DC | PRN
Start: 2020-09-27 — End: 2020-09-27
  Administered 2020-09-27: 30 mL

## 2020-09-27 MED ORDER — MORPHINE SULFATE (PF) 2 MG/ML IV SOLN
0.5000 mg | INTRAVENOUS | Status: DC | PRN
  Administered 2020-09-27 (×2): 1 mg via INTRAVENOUS
  Filled 2020-09-27 (×2): qty 1

## 2020-09-27 MED ORDER — LIDOCAINE 2% (20 MG/ML) 5 ML SYRINGE
INTRAMUSCULAR | Status: AC
  Filled 2020-09-27: qty 5

## 2020-09-27 MED ORDER — SODIUM CHLORIDE (PF) 0.9 % IJ SOLN
INTRAMUSCULAR | Status: DC | PRN
  Administered 2020-09-27: 50 mL

## 2020-09-27 MED ORDER — ONDANSETRON HCL 4 MG/2ML IJ SOLN: 4.0000 mg | Freq: Four times a day (QID) | INTRAMUSCULAR | Status: DC | PRN

## 2020-09-27 MED ORDER — DIPHENHYDRAMINE HCL 12.5 MG/5ML PO ELIX: 12.5000 mg | ORAL_SOLUTION | ORAL | Status: DC | PRN

## 2020-09-27 MED ORDER — ACETAMINOPHEN 500 MG PO TABS: 1000.0000 mg | ORAL_TABLET | Freq: Once | ORAL | Status: DC

## 2020-09-27 MED ORDER — CEFAZOLIN IN SODIUM CHLORIDE 3-0.9 GM/100ML-% IV SOLN
3.0000 g | INTRAVENOUS | Status: DC
  Filled 2020-09-27: qty 100

## 2020-09-27 MED ORDER — STERILE WATER FOR IRRIGATION IR SOLN
Status: DC | PRN
  Administered 2020-09-27: 2000 mL

## 2020-09-27 MED ORDER — METOCLOPRAMIDE HCL 5 MG/ML IJ SOLN: 5.0000 mg | Freq: Three times a day (TID) | INTRAMUSCULAR | Status: DC | PRN

## 2020-09-27 MED ORDER — SODIUM CHLORIDE (PF) 0.9 % IJ SOLN
INTRAMUSCULAR | Status: AC
  Filled 2020-09-27: qty 30

## 2020-09-27 MED ORDER — DEXMEDETOMIDINE (PRECEDEX) IN NS 20 MCG/5ML (4 MCG/ML) IV SYRINGE
PREFILLED_SYRINGE | INTRAVENOUS | Status: DC | PRN
  Administered 2020-09-27: 8 ug via INTRAVENOUS

## 2020-09-27 SURGICAL SUPPLY — 92 items
ADH SKN CLS APL DERMABOND .7 (GAUZE/BANDAGES/DRESSINGS) ×2
APL PRP STRL LF DISP 70% ISPRP (MISCELLANEOUS) ×2
AUG FEM 9 9+ 5 STRL KN DIST (Miscellaneous) ×2 IMPLANT
AUG TIB AB CONE CNTR STRL LF (Insert) ×1 IMPLANT
AUG TIB EF 10 HLF BLCK STRL LL (Joint) ×1 IMPLANT
AUG TIB EF 10 HLF BLCK STRL LM (Joint) ×1 IMPLANT
AUG TIB HALF BLOCK PS EF LL 10 (Joint) ×2 IMPLANT
AUG TIB HALF BLOCK PS EF LM 10 (Joint) ×2 IMPLANT
AUGMENT PSN FEM DIST SZ9 5 (Miscellaneous) ×2 IMPLANT
AUGMENT TIB HALF BLC PS LL 10 (Joint) IMPLANT
AUGMENT TIB HALF BLC PS LM 10 (Joint) IMPLANT
BAG DECANTER FOR FLEXI CONT (MISCELLANEOUS) ×4 IMPLANT
BAG SPEC THK2 15X12 ZIP CLS (MISCELLANEOUS) ×1
BAG ZIPLOCK 12X15 (MISCELLANEOUS) ×2 IMPLANT
BLADE SAW RECIPROCATING 77.5 (BLADE) ×2 IMPLANT
BLADE SAW SGTL 81X20 HD (BLADE) ×3 IMPLANT
BLADE SURG SZ10 CARB STEEL (BLADE) ×2 IMPLANT
BNDG ELASTIC 4X5.8 VLCR STR LF (GAUZE/BANDAGES/DRESSINGS) ×1 IMPLANT
BNDG ELASTIC 6X5.8 VLCR STR LF (GAUZE/BANDAGES/DRESSINGS) ×2 IMPLANT
CANISTER WOUND CARE 500ML ATS (WOUND CARE) ×1 IMPLANT
CEMENT BONE REFOBACIN R1X40 US (Cement) ×4 IMPLANT
CHLORAPREP W/TINT 26 (MISCELLANEOUS) ×4 IMPLANT
COMPONENT TIB KNEE SZ E UNC (Knees) ×1 IMPLANT
COVER SURGICAL LIGHT HANDLE (MISCELLANEOUS) ×2 IMPLANT
COVER WAND RF STERILE (DRAPES) IMPLANT
CUFF TOURN SGL QUICK 34 (TOURNIQUET CUFF) ×2
CUFF TRNQT CYL 34X4.125X (TOURNIQUET CUFF) ×1 IMPLANT
DECANTER SPIKE VIAL GLASS SM (MISCELLANEOUS) IMPLANT
DERMABOND ADVANCED (GAUZE/BANDAGES/DRESSINGS) ×2
DERMABOND ADVANCED .7 DNX12 (GAUZE/BANDAGES/DRESSINGS) ×2 IMPLANT
DRAPE SHEET LG 3/4 BI-LAMINATE (DRAPES) ×6 IMPLANT
DRAPE U-SHAPE 47X51 STRL (DRAPES) ×2 IMPLANT
DRESSING PREVENA PLUS CUSTOM (GAUZE/BANDAGES/DRESSINGS) IMPLANT
DRSG AQUACEL AG ADV 3.5X10 (GAUZE/BANDAGES/DRESSINGS) ×2 IMPLANT
DRSG PREVENA PLUS CUSTOM (GAUZE/BANDAGES/DRESSINGS) ×2
DRSG TEGADERM 4X4.75 (GAUZE/BANDAGES/DRESSINGS) ×1 IMPLANT
ELECT BLADE TIP CTD 4 INCH (ELECTRODE) ×2 IMPLANT
ELECT REM PT RETURN 15FT ADLT (MISCELLANEOUS) ×2 IMPLANT
EVACUATOR 1/8 PVC DRAIN (DRAIN) ×2 IMPLANT
GAUZE SPONGE 2X2 8PLY STRL LF (GAUZE/BANDAGES/DRESSINGS) IMPLANT
GLOVE SRG 8 PF TXTR STRL LF DI (GLOVE) ×1 IMPLANT
GLOVE SURG ENC MOIS LTX SZ8.5 (GLOVE) ×4 IMPLANT
GLOVE SURG ENC TEXT LTX SZ7.5 (GLOVE) ×4 IMPLANT
GLOVE SURG UNDER POLY LF SZ8 (GLOVE) ×2
GLOVE SURG UNDER POLY LF SZ8.5 (GLOVE) ×2 IMPLANT
GOWN SPEC L3 XXLG W/TWL (GOWN DISPOSABLE) ×2 IMPLANT
GOWN STRL REUS W/TWL XL LVL3 (GOWN DISPOSABLE) ×2 IMPLANT
HANDPIECE INTERPULSE COAX TIP (DISPOSABLE) ×2
HOLDER FOLEY CATH W/STRAP (MISCELLANEOUS) ×1 IMPLANT
HOOD PEEL AWAY FLYTE STAYCOOL (MISCELLANEOUS) ×8 IMPLANT
INSERT TIB BEAR AS PS 18 LT (Insert) ×1 IMPLANT
INSERT TIB PS CMTLS CC FX (Insert) ×1 IMPLANT
JET LAVAGE IRRISEPT WOUND (IRRIGATION / IRRIGATOR)
KIT TURNOVER KIT A (KITS) ×2 IMPLANT
LAVAGE JET IRRISEPT WOUND (IRRIGATION / IRRIGATOR) IMPLANT
MANIFOLD NEPTUNE II (INSTRUMENTS) ×2 IMPLANT
MARKER SKIN DUAL TIP RULER LAB (MISCELLANEOUS) ×2 IMPLANT
NDL SPNL 18GX3.5 QUINCKE PK (NEEDLE) ×1 IMPLANT
NEEDLE SPNL 18GX3.5 QUINCKE PK (NEEDLE) ×2 IMPLANT
NS IRRIG 1000ML POUR BTL (IV SOLUTION) ×2 IMPLANT
OSTEOTOME THIN 10.0 6 (INSTRUMENTS) ×1 IMPLANT
PACK TOTAL KNEE CUSTOM (KITS) ×2 IMPLANT
PADDING CAST COTTON 6X4 STRL (CAST SUPPLIES) ×1 IMPLANT
PENCIL SMOKE EVACUATOR (MISCELLANEOUS) IMPLANT
PROSTHESIS FEM CMT STD SZ9 LT (Miscellaneous) ×1 IMPLANT
PROTECTOR NERVE ULNAR (MISCELLANEOUS) ×2 IMPLANT
SAW OSC TIP CART 19.5X105X1.3 (SAW) ×2 IMPLANT
SCREW HEX HEADED 3.5X27 DISP (Screw) ×1 IMPLANT
SEALER BIPOLAR AQUA 6.0 (INSTRUMENTS) ×2 IMPLANT
SET HNDPC FAN SPRY TIP SCT (DISPOSABLE) ×1 IMPLANT
SET PAD KNEE POSITIONER (MISCELLANEOUS) ×2 IMPLANT
SPONGE DRAIN TRACH 4X4 STRL 2S (GAUZE/BANDAGES/DRESSINGS) ×2 IMPLANT
SPONGE GAUZE 2X2 STER 10/PKG (GAUZE/BANDAGES/DRESSINGS) ×1
SPONGE LAP 18X18 RF (DISPOSABLE) IMPLANT
STAPLER VISISTAT 35W (STAPLE) ×1 IMPLANT
STEM REV EXT OFFSET PS 12X175 (Stem) ×1 IMPLANT
STEM STRT SPLINE EXT PS 18X135 (Stem) ×1 IMPLANT
SUT MNCRL AB 3-0 PS2 18 (SUTURE) ×2 IMPLANT
SUT MON AB 2-0 CT1 36 (SUTURE) ×4 IMPLANT
SUT STRATAFIX PDO 1 14 VIOLET (SUTURE) ×2
SUT STRATFX PDO 1 14 VIOLET (SUTURE) ×1
SUT VIC AB 1 CTX 36 (SUTURE) ×6
SUT VIC AB 1 CTX36XBRD ANBCTR (SUTURE) ×2 IMPLANT
SUT VIC AB 2-0 CT1 27 (SUTURE) ×2
SUT VIC AB 2-0 CT1 TAPERPNT 27 (SUTURE) ×1 IMPLANT
SUTURE STRATFX PDO 1 14 VIOLET (SUTURE) ×1 IMPLANT
SYR 50ML LL SCALE MARK (SYRINGE) ×1 IMPLANT
TOWER CARTRIDGE SMART MIX (DISPOSABLE) ×4 IMPLANT
TRAY FOLEY MTR SLVR 16FR STAT (SET/KITS/TRAYS/PACK) ×2 IMPLANT
TUBE SUCTION HIGH CAP CLEAR NV (SUCTIONS) ×2 IMPLANT
WATER STERILE IRR 1000ML POUR (IV SOLUTION) ×2 IMPLANT
WRAP KNEE MAXI GEL POST OP (GAUZE/BANDAGES/DRESSINGS) ×2 IMPLANT

## 2020-09-27 NOTE — Progress Notes (Signed)
Assisted Dr. Chelsey Woodrum with left, ultrasound guided, adductor canal block. Side rails up, monitors on throughout procedure. See vital signs in flow sheet. Tolerated Procedure well.  

## 2020-09-27 NOTE — Op Note (Signed)
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  POSTOPERATIVE DIAGNOSIS:  Same.  PROCEDURE: Revision left total knee arthroplasty, femoral and tibial components. Application of negative pressure incisional dressing.  ANESTHESIA:   Regional block. Spinal. MAC.  ANTIBIOTICS: 2 g Ancef.  IMPLANTS: Zimmer persona revision femur size 9 with 5 mm distal augment x2 and 18 x 135 mm splined stem. Persona revision tibia size E with 10 mm medial and lateral augments, 12 x 175 mm splined stem. Persona revision trabecular metal tibial central cone. Vivacit-E polyinsert, size 18 mm CCK. Refobacin R bone cement.  SPECIMENS: Left knee synovial fluid for cell count and culture. Left knee pseudocapsule for tissue culture.  COMPLICATIONS: None.  DISPOSITION: Stable to PACU.  SURGICAL INDICATIONS:  Michael [name] is a 60 y.o. male who underwent primary left total knee arthroplasty by Dr. Erlinda Hong in 2018 with Zimmer persona cementless PS system.  Patient had persistent pain and ultimately underwent a subsequent polyexchange and then tibial component revision.  His last surgery was in 2020 with Dr Erlinda Hong.  He had continued pain.  Inflammatory markers were negative.  Bone scan showed loosening of femoral and tibial components.  I saw him in the office, and he was indicated for revision left total knee arthroplasty.  We discussed the risk, benefits, and alternatives.  He elected to proceed.  The risks, benefits, and alternatives were discussed with the patient. There are risks associated with the surgery including, but not limited to, problems with anesthesia (death), infection, instability (giving out of the joint), dislocation, differences in leg length/angulation/rotation, fracture of bones, loosening or failure of  implants, hematoma (blood accumulation) which may require surgical drainage, blood clots, pulmonary embolism, nerve injury (foot drop and lateral thigh numbness), and blood vessel injury. The patient understands these risks and elects to proceed.  PROCEDURE IN DETAIL: The patient was identified in the holding area using 2 identifiers.  The surgical site was marked by myself.  Adductor canal block was placed by the anesthesia team.  He was brought to the operating room, and spinal anesthesia was obtained.  A Foley catheter was placed.  Nonsterile tourniquet was applied to the left upper thigh.  All bony prominences were well-padded.  The left lower extremity was prepped and draped in the normal sterile surgical fashion.  Timeout was called, verifying site and site of surgery.  Esmarch exsanguination was performed, and then the tourniquet was elevated to 250 mmHg.  Using a #10 blade, I sharply excised his previous anterior skin incision.  Full-thickness skin flaps were created.  Before making the arthrotomy, I inserted an 18-gauge needle into the knee and aspirated about 6 cc of blood-tinged joint fluid.  This fluid was sent for cell count with differential and culture.  I then made a standard medial parapatellar arthrotomy.  I performed a radical synovectomy of the medial gutter, lateral gutter, and suprapatellar pouch.  A representative sample of synovium was sent for tissue culture.  Using Bovie electrocautery, I sharply excised the infrapatellar scar.  Using an ACL blade and flexible osteotomes, I attempted to loosen the implant bone interface on the femur and the implant cement interface on the tibia.  I then removed the polyliner without any difficulty.  Using a bone tamp, I remove the femur.  He had fibrous ingrowth to the  posterior condyle, posterior chamfer, and distal femoral surfaces.  There was bony ingrowth under the anterior flange and about one half of the anterior chamfer.  The tibia was then  exposed.  I easily removed the tibial component using a bone tamp.  The cement mantle was adherent to the implant, but the cement mantle itself was not adherent to the bone.  There were no large uncontained bony defects.  I debrided the fibrous covered bony surfaces with a curette.  I then sequentially reamed up to a 12 mm reamer on the tibia taking care to bypass the previous stem length, and an 18 mm reamer on the femur.  Intramedullary cutting guide was used to make a skim cut off the proximal tibia.  I sized the femur and the tibia.  I selected a +6 mm offset for the tibia.  Proximal tibia was punched for a keel.  Proximal tibia was broached for a fixed-bearing central tibial cone.  Trial implants were assembled on the back table.  I utilized +10 mm medial and lateral tibial augments.  I utilized +5 mm distal femoral augments x2.  The trial components were impacted and I selected a CCK bearing.  Once it was obvious that I had the flexion and extension gaps perfectly matched, the trial implants were removed.  The cut bony surface was drilled with pins to improve the cement mantle.  The bone was irrigated with pulse lavage and then dried.  The real implants were opened and assembled on the back table.  I impacted the tibial cone into place without any difficulty.  In 2 separate batches, I cemented first the tibia and then the femur using a hybrid fixation technique.  I then trialed with a 18 mm CCK polyinsert.  The knee was stable to a varus and valgus force throughout the range of motion.  The patella tracked well with no thumbs technique.  The flexion and extension gaps were well matched.  The tourniquet was then let down, and meticulous hemostasis was achieved with aqua mantis and Bovie electrocautery.  The knee was irrigated with Irrisept followed by normal saline using pulse lavage.  I closed the arthrotomy over a medium Hemovac drain with #1 Vicryl and #1 strata fix suture.  The deep dermal layer was  closed with 2-0 Monocryl.  The skin was reapproximated with staples.  Then, customizable Prevena incisional negative pressure dressing was applied according to manufacturer's instructions.  Suction was hooked up to 125 mmHg, and there was no leak.  Bulky dressing was then applied with cast padding and an Ace wrap.  POSTOPERATIVE PLAN: Postoperatively, patient be admitted to the orthopedic floor.  He may weight-bear as tolerated left lower extremity with a walker.  We will place him on aspirin for DVT prophylaxis.  He will receive routine antibiotic prophylaxis.  Mobilize out of bed with physical therapy.  Plan for discharge home with outpatient physical therapy.  We will follow his Intra-Op routine cultures.  Please note that a surgical assistant was a medical necessity for this procedure to perform it in a safe and expeditious manner. Assistant was necessary to provide appropriate retraction of vital neurovascular structures, to prevent femoral fracture, and to allow for anatomic placement of the prosthesis.

## 2020-09-27 NOTE — Anesthesia Procedure Notes (Signed)
Spinal  Patient location during procedure: OR Start time: 09/27/2020 12:28 PM End time: 09/27/2020 12:34 PM Reason for block: surgical anesthesia Staffing Performed: anesthesiologist  Anesthesiologist: Elmer Picker, MD Preanesthetic Checklist Completed: patient identified, IV checked, risks and benefits discussed, surgical consent, monitors and equipment checked, pre-op evaluation and timeout performed Spinal Block Patient position: sitting Prep: DuraPrep and site prepped and draped Patient monitoring: cardiac monitor, continuous pulse ox and blood pressure Approach: midline Location: L3-4 Injection technique: single-shot Needle Needle type: Pencan  Needle gauge: 24 G Needle length: 9 cm Assessment Sensory level: T6 Events: CSF return and second provider Additional Notes Functioning IV was confirmed and monitors were applied. Sterile prep and drape, including hand hygiene and sterile gloves were used. The patient was positioned and the spine was prepped. The skin was anesthetized with lidocaine.  Free flow of clear CSF was obtained prior to injecting local anesthetic into the CSF.  The spinal needle aspirated freely following injection.  The needle was carefully withdrawn.  The patient tolerated the procedure well.

## 2020-09-27 NOTE — H&P (Signed)
TOTAL KNEE REVISION ADMISSION H&P  Patient is being admitted for left revision total knee arthroplasty.  Subjective:  Chief Complaint:left knee pain.  HPI: Michael [name], 60 y.o. male, has a history of pain and functional disability in the left knee(s) due to failed previous arthroplasty and patient has failed non-surgical conservative treatments for greater than 12 weeks to include NSAID's and/or analgesics, weight reduction as appropriate and activity modification. The indications for the revision of the total knee arthroplasty are loosening of one or more components. Onset of symptoms was abrupt starting 1 years ago with gradually worsening course since that time.  Prior procedures on the left knee(s) include total knee arthroplasty, total knee arthroplasty revision. Patient currently rates pain in the left knee(s) at 8 out of 10 with activity. There is worsening of pain with activity and weight bearing, pain that interferes with activities of daily living and pain with passive range of motion.  Patient has evidence of prosthetic loosening by imaging studies. This condition presents safety issues increasing the risk of falls.  There is no current active infection.      Patient Active Problem List   Diagnosis Date Noted  . Prediabetes 05/03/2020  . Osteoarthritis of left hip 12/10/2019  . 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 Status post revision of total knee replacement, 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  . Unilateral primary osteoarthritis, left hip 09/08/2018  . S/P revision of total knee, left 06/15/2018  . Positive depression screening 05/22/2017  . Hepatitis C virus infection cured after antiviral drug therapy 01/19/2017  . Hyperlipidemia 10/23/2016  . Hypertension 10/18/2016  . Tobacco dependence 10/18/2016  . 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 Right rotator cuff tear 01/29/2013       Past Medical History:  Diagnosis Date  .  Arthritis   . Hepatitis    Hep C rx  . Hypertension   . Pre-diabetes          Past Surgical History:  Procedure Laterality Date  . COLONOSCOPY    . left knee arthroscopy     . left rotator cuff surgery     . REVISION TOTAL KNEE ARTHROPLASTY Left 06/15/2018  . SHOULDER OPEN ROTATOR CUFF REPAIR Right 01/29/2013   Procedure: RIGHT SHOULDER MINI ROTATOR CUFF REPAIR;  Surgeon: Javier Docker, MD;  Location: [facility];  Service: Orthopedics;  Laterality: Right;  With ANCHORS  . TOTAL HIP ARTHROPLASTY Left 06/08/2020   Procedure: TOTAL HIP ARTHROPLASTY ANTERIOR APPROACH;  Surgeon: Samson Frederic, MD;  Location: [facility];  Service: Orthopedics;  Laterality: Left;  . TOTAL KNEE ARTHROPLASTY Left 02/12/2017  . TOTAL KNEE ARTHROPLASTY Left 02/12/2017   Procedure: LEFT TOTAL KNEE ARTHROPLASTY;  Surgeon: Tarry Kos, MD;  Location: [facility];  Service: Orthopedics;  Laterality: Left;  . TOTAL KNEE REVISION Left 06/15/2018   Procedure: LEFT TOTAL KNEE REVISION;  Surgeon: Tarry Kos, MD;  Location: [facility] OR;  Service: Orthopedics;  Laterality: Left;  . TOTAL KNEE REVISION Left 03/08/2019   Procedure: LEFT TOTAL KNEE REVISION;  Surgeon: Tarry Kos, MD;  Location: [facility] OR;  Service: Orthopedics;  Laterality: Left;           Current Outpatient Medications  Medication Sig Dispense Refill Last Dose  . amLODipine (NORVASC) 10 MG tablet TAKE 1 TABLET BY MOUTH EVERY DAY (Patient taking differently: Take 10 mg by mouth daily.) 90 tablet 0   . aspirin 81 MG chewable tablet Chew 81 mg by mouth  2 (two) times daily.     [name] atorvastatin (LIPITOR) 20 MG tablet Take 1 tablet (20 mg total) by mouth daily. 90 tablet 1   . docusate sodium (COLACE) 100 MG capsule Take 1 capsule (100 mg total) by mouth 2 (two) times daily. (Patient not taking: Reported on 07/25/2020) 60 capsule 1   . hydrALAZINE (APRESOLINE) 10 MG tablet TAKE 1 TABLET BY MOUTH TWICE A DAY (Patient taking differently: Take 10 mg by mouth 2  (two) times daily.) 180 tablet 0   . HYDROcodone-acetaminophen (NORCO) 5-325 MG tablet Take 1 tablet by mouth every 4 (four) hours as needed for moderate pain. (Patient not taking: Reported on 07/25/2020) 40 tablet 0   . lisinopril-hydrochlorothiazide (ZESTORETIC) 20-25 MG tablet TAKE 1 TABLET BY MOUTH EVERY DAY (Patient taking differently: Take 1 tablet by mouth daily.) 90 tablet 1   . methadone (DOLOPHINE) 10 MG tablet Take 3 tablets (30 mg total) by mouth every 8 (eight) hours. (Patient not taking: Reported on 07/25/2020) 30 tablet 0   . nicotine polacrilex (NICORETTE) 2 MG gum TAKE 1 EACH (2 MG TOTAL) BY MOUTH AS NEEDED FOR SMOKING CESSATION. MAX 30 PIECES/DAY 100 each 0   . ondansetron (ZOFRAN) 4 MG tablet Take 1 tablet (4 mg total) by mouth every 8 (eight) hours as needed for nausea or vomiting. (Patient not taking: Reported on 07/25/2020) 20 tablet 0   . psyllium (METAMUCIL) 58.6 % packet Take 1 packet by mouth daily.     [name] senna (SENOKOT) 8.6 MG TABS tablet Take 2 tablets (17.2 mg total) by mouth at bedtime. (Patient not taking: Reported on 07/25/2020) 60 tablet 1   . traZODone (DESYREL) 50 MG tablet TAKE 0.5 TABLETS (25 MG TOTAL) BY MOUTH AT BEDTIME AS NEEDED FOR SLEEP. 45 tablet 0    No current facility-administered medications for this visit.       Allergies  Allergen Reactions  . Chantix [Varenicline] Itching and Rash    Social History        Tobacco Use  . Smoking status: Former Smoker    Packs/day: 1.00    Years: 37.00    Pack years: 37.00    Types: Cigarettes    Quit date: 04/20/2020    Years since quitting: 0.2  . Smokeless tobacco: Never Used  Substance Use Topics  . Alcohol use: Not Currently         Family History  Problem Relation Age of Onset  . CAD Mother   . Deep vein thrombosis Brother       Review of Systems  Musculoskeletal: Positive for arthralgias.  All other systems reviewed and are negative.    Objective:  Physical  Exam HENT:     Head: Normocephalic and atraumatic.  Eyes:     Extraocular Movements: Extraocular movements intact.     Pupils: Pupils are equal, round, and reactive to light.  Cardiovascular:     Rate and Rhythm: Normal rate and regular rhythm.     Heart sounds: Normal heart sounds.  Abdominal:     Palpations: Abdomen is soft.     Tenderness: There is no abdominal tenderness.  Genitourinary:    Comments: Deferred Musculoskeletal:        General: Tenderness present.     Cervical back: Normal range of motion and neck supple.  Skin:    General: Skin is warm and dry.  Neurological:     Mental Status: He is alert and oriented to person, place, and time.  Psychiatric:  Mood and Affect: Mood normal.     Vital signs in last 24 hours: @VSRANGES @  Labs:  Estimated body mass index is 36.82 kg/m as calculated from the following:   Height as of 06/08/20: 5\' 11"  (1.803 m).   Weight as of 06/08/20: 119.7 kg.  Imaging Review Plain radiographs demonstrate severe degenerative joint disease of the left knee(s). The overall alignment is neutral.There is evidence of loosening of the tibial components. The bone quality appears to be adequate for age and reported activity level.    Assessment/Plan:  End stage arthritis, left knee(s) with failed previous arthroplasty.   The patient history, physical examination, clinical judgment of the provider and imaging studies are consistent with end stage degenerative joint disease of the left knee(s), previous total knee arthroplasty. Revision total knee arthroplasty is deemed medically necessary. The treatment options including medical management, injection therapy, arthroscopy and revision arthroplasty were discussed at length. The risks and benefits of revision total knee arthroplasty were presented and reviewed. The risks due to aseptic loosening, infection, stiffness, patella tracking problems, thromboembolic complications and other  imponderables were discussed. The patient acknowledged the explanation, agreed to proceed with the plan and consent was signed. Patient is being admitted for inpatient treatment for surgery, pain control, PT, OT, prophylactic antibiotics, VTE prophylaxis, progressive ambulation and ADL's and discharge planning.The patient is planning to be discharged home

## 2020-09-27 NOTE — Anesthesia Postprocedure Evaluation (Signed)
Anesthesia Post Note  Patient: Michael [name]  Procedure(s) Performed: Left total knee arthroplasty revision (Left Knee)     Patient location during evaluation: PACU Anesthesia Type: Regional and Spinal Level of consciousness: oriented and awake and alert Pain management: pain level controlled Vital Signs Assessment: post-procedure vital signs reviewed and stable Respiratory status: spontaneous breathing, respiratory function stable and patient connected to nasal cannula oxygen Cardiovascular status: blood pressure returned to baseline and stable Postop Assessment: no headache, no backache and no apparent nausea or vomiting Anesthetic complications: no   No complications documented.  Last Vitals:  Vitals:   09/27/20 1745 09/27/20 1827  BP: (!) 141/81 122/87  Pulse: 76 75  Resp: 12 20  Temp: 36.6 C 36.6 C  SpO2: 99% 96%    Last Pain:  Vitals:   09/27/20 1827  TempSrc: Oral  PainSc: 8                  Makoto Sellitto L Donye Dauenhauer

## 2020-09-27 NOTE — Transfer of Care (Signed)
Immediate Anesthesia Transfer of Care Note  Patient: Michael [name]  Procedure(s) Performed: Left total knee arthroplasty revision (Left Knee)  Patient Location: PACU  Anesthesia Type:Spinal  Level of Consciousness: drowsy and patient cooperative  Airway & Oxygen Therapy: Patient Spontanous Breathing and Patient connected to face mask oxygen  Post-op Assessment: Report given to RN and Post -op Vital signs reviewed and stable  Post vital signs: Reviewed and stable  Last Vitals:  Vitals Value Taken Time  BP 167/103 09/27/20 1652  Temp    Pulse 91 09/27/20 1655  Resp 9 09/27/20 1655  SpO2 99 % 09/27/20 1655  Vitals shown include unvalidated device data.  Last Pain:  Vitals:   09/27/20 1138  TempSrc:   PainSc: 0-No pain         Complications: No complications documented.

## 2020-09-27 NOTE — Anesthesia Procedure Notes (Signed)
Procedure Name: MAC Date/Time: 09/27/2020 12:35 PM Performed by: Lieutenant Diego, CRNA Pre-anesthesia Checklist: Patient identified, Emergency Drugs available, Suction available, Patient being monitored and Timeout performed Patient Re-evaluated:Patient Re-evaluated prior to induction Oxygen Delivery Method: Simple face mask Preoxygenation: Pre-oxygenation with 100% oxygen Induction Type: IV induction

## 2020-09-27 NOTE — Anesthesia Procedure Notes (Signed)
Anesthesia Regional Block: Adductor canal block   Pre-Anesthetic Checklist: ,, timeout performed, Correct Patient, Correct Site, Correct Laterality, Correct Procedure, Correct Position, site marked, Risks and benefits discussed,  Surgical consent,  Pre-op evaluation,  At surgeon's request and post-op pain management  Laterality: Left  Prep: Maximum Sterile Barrier Precautions used, chloraprep       Needles:  Injection technique: Single-shot  Needle Type: Echogenic Stimulator Needle     Needle Length: 9cm  Needle Gauge: 22     Additional Needles:   Procedures:,,,, ultrasound used (permanent image in chart),,,,  Narrative:  Start time: 09/27/2020 11:27 AM End time: 09/27/2020 11:37 AM Injection made incrementally with aspirations every 5 mL.  Performed by: Personally  Anesthesiologist: Elmer Picker, MD  Additional Notes: Monitors applied. No increased pain on injection. No increased resistance to injection. Injection made in 5cc increments. Good needle visualization. Patient tolerated procedure well.

## 2020-09-27 NOTE — Anesthesia Preprocedure Evaluation (Signed)
Anesthesia Evaluation  Patient identified by MRN, date of birth, ID band Patient awake    Reviewed: Allergy & Precautions, NPO status , Patient's Chart, lab work & pertinent test results  Airway Mallampati: I  TM Distance: >3 FB Neck ROM: Full    Dental  (+) Missing, Chipped, Dental Advisory Given,    Pulmonary neg pulmonary ROS, former smoker,    Pulmonary exam normal breath sounds clear to auscultation       Cardiovascular hypertension, Pt. on medications Normal cardiovascular exam Rhythm:Regular Rate:Normal     Neuro/Psych negative neurological ROS  negative psych ROS   GI/Hepatic negative GI ROS, (+) Hepatitis -, C  Endo/Other  negative endocrine ROS  Renal/GU negative Renal ROS  negative genitourinary   Musculoskeletal  (+) Arthritis , narcotic dependent  Abdominal   Peds  Hematology negative hematology ROS (+)   Anesthesia Other Findings On methadone 30mg  QD  Reproductive/Obstetrics                             Anesthesia Physical Anesthesia Plan  ASA: II  Anesthesia Plan: Spinal and Regional   Post-op Pain Management:  Regional for Post-op pain   Induction:   PONV Risk Score and Plan: Treatment may vary due to age or medical condition, Propofol infusion, Midazolam, Ondansetron and Dexamethasone  Airway Management Planned: Natural Airway  Additional Equipment:   Intra-op Plan:   Post-operative Plan:   Informed Consent: I have reviewed the patients History and Physical, chart, labs and discussed the procedure including the risks, benefits and alternatives for the proposed anesthesia with the patient or authorized representative who has indicated his/her understanding and acceptance.     Dental advisory given  Plan Discussed with: CRNA  Anesthesia Plan Comments:         Anesthesia Quick Evaluation

## 2020-09-27 NOTE — Discharge Instructions (Signed)
 Dr. Sharada Albornoz Total Joint Specialist [facility] [address] [place], [place] [address] [phone]  TOTAL KNEE REPLACEMENT POSTOPERATIVE DIRECTIONS    Knee Rehabilitation, Guidelines Following Surgery  Results after knee surgery are often greatly improved when you follow the exercise, range of motion and muscle strengthening exercises prescribed by your doctor. Safety measures are also important to protect the knee from further injury. Any time any of these exercises cause you to have increased pain or swelling in your knee joint, decrease the amount until you are comfortable again and slowly increase them. If you have problems or questions, call your caregiver or physical therapist for advice.   WEIGHT BEARING Weight bearing as tolerated with assist device (walker, cane, etc) as directed, use it as long as suggested by your surgeon or therapist, typically at least 4-6 weeks.  HOME CARE INSTRUCTIONS  . Remove items at home which could result in a fall. This includes throw rugs or furniture in walking pathways.  . Continue medications as instructed at time of discharge. . You may have some home medications which will be placed on hold until you complete the course of blood thinner medication.  . You may start showering once you are discharged home but do not submerge the incision under water. Just pat the incision dry and apply a dry gauze dressing on daily. . Walk with walker as instructed.  . You may resume a sexual relationship in one month or when given the OK by your doctor.   Use walker as long as suggested by your caregivers.  Avoid periods of inactivity such as sitting longer than an hour when not asleep. This helps prevent blood clots.  . You may put full weight on your legs and walk as much as is comfortable.  . You may return to work once you are cleared by your doctor.  . Do not drive a car for 6 weeks or until released by you surgeon.    Do not drive while taking narcotics.  . Wear the elastic stockings for three weeks following surgery during the day but you may remove then at night. . Make sure you keep all of your appointments after your operation with all of your doctors and caregivers. You should call the office at the above phone number and make an appointment for approximately two weeks after the date of your surgery. . Do not remove your surgical dressing. The dressing is waterproof; you may take showers in 3 days, but do not take tub baths or submerge the dressing. . Please pick up a stool softener and laxative for home use as long as you are requiring pain medications.  ICE to the affected knee every three hours for 30 minutes at a time and then as needed for pain and swelling.  Continue to use ice on the knee for pain and swelling from surgery. You may notice swelling that will progress down to the foot and ankle.  This is normal after surgery.  Elevate the leg when you are not up walking on it.   . It is important for you to complete the blood thinner medication as prescribed by your doctor.  Continue to use the breathing machine which will help keep your temperature down.  It is common for your temperature to cycle up and down following surgery, especially at night when you are not up moving around and exerting yourself.  The breathing machine keeps your lungs expanded and your temperature down.    RANGE OF MOTION AND STRENGTHENING EXERCISES  Rehabilitation of the knee is important following a knee injury or an operation. After just a few days of immobilization, the muscles of the thigh which control the knee become weakened and shrink (atrophy). Knee exercises are designed to build up the tone and strength of the thigh muscles and to improve knee motion. Often times heat used for twenty to thirty minutes before working out will loosen up your tissues and help with improving the range of motion but do not use heat for the  first two weeks following surgery. These exercises can be done on a training (exercise) mat, on the floor, on a table or on a bed. Use what ever works the best and is most comfortable for you Knee exercises include:  . Leg Lifts - While your knee is still immobilized in a splint or cast, you can do straight leg raises. Lift the leg to 60 degrees, hold for 3 sec, and slowly lower the leg. Repeat 10-20 times 2-3 times daily. Perform this exercise against resistance later as your knee gets better.  . Quad and Hamstring Sets - Tighten up the muscle on the front of the thigh (Quad) and hold for 5-10 sec. Repeat this 10-20 times hourly. Hamstring sets are done by pushing the foot backward against an object and holding for 5-10 sec. Repeat as with quad sets.  A rehabilitation program following serious knee injuries can speed recovery and prevent re-injury in the future due to weakened muscles. Contact your doctor or a physical therapist for more information on knee rehabilitation.   POST-OPERATIVE OPIOID TAPER INSTRUCTIONS: . It is important to wean off of your opioid medication as soon as possible. If you do not need pain medication after your surgery it is ok to stop day one. . Opioids include: o Codeine, Hydrocodone(Norco, Vicodin), Oxycodone(Percocet, oxycontin) and hydromorphone amongst others.  . Long term and even short term use of opiods can cause: o Increased pain response o Dependence o Constipation o Depression o Respiratory depression o And more.  . Withdrawal symptoms can include o Flu like symptoms o Nausea, vomiting o And more . Techniques to manage these symptoms o Hydrate well o Eat regular healthy meals o Stay active o Use relaxation techniques(deep breathing, meditating, yoga) . Do Not substitute Alcohol to help with tapering . If you have been on opioids for less than two weeks and do not have pain than it is ok to stop all together.  . Plan to wean off of opioids o This plan  should start within one week post op of your joint replacement. o Maintain the same interval or time between taking each dose and first decrease the dose.  o Cut the total daily intake of opioids by one tablet each day o Next start to increase the time between doses. o The last dose that should be eliminated is the evening dose.      SKILLED REHAB INSTRUCTIONS: If the patient is transferred to a skilled rehab facility following release from the hospital, a list of the current medications will be sent to the facility for the patient to continue.  When discharged from the skilled rehab facility, please have the facility set up the patient's Home Health Physical Therapy prior to being released. Also, the skilled facility will be responsible for providing the patient with their medications at time of release from the facility to include their pain medication, the muscle relaxants, and their blood thinner medication. If the   patient is still at the rehab facility at time of the two week follow up appointment, the skilled rehab facility will also need to assist the patient in arranging follow up appointment in our office and any transportation needs.  MAKE SURE YOU:  . Understand these instructions.  . Will watch your condition.  . Will get help right away if you are not doing well or get worse.    Pick up stool softner and laxative for home use following surgery while on pain medications. Do NOT remove your dressing. You may shower.  Do not take tub baths or submerge incision under water. May shower starting three days after surgery. Please use a clean towel to pat the incision dry following showers. Continue to use ice for pain and swelling after surgery. Do not use any lotions or creams on the incision until instructed by your surgeon.  

## 2020-09-28 LAB — CBC
HCT: 34.3 % — ABNORMAL LOW (ref 39.0–52.0)
Hemoglobin: 11.3 g/dL — ABNORMAL LOW (ref 13.0–17.0)
MCH: 29.4 pg (ref 26.0–34.0)
MCHC: 32.9 g/dL (ref 30.0–36.0)
MCV: 89.1 fL (ref 80.0–100.0)
Platelets: 238 10*3/uL (ref 150–400)
RBC: 3.85 MIL/uL — ABNORMAL LOW (ref 4.22–5.81)
RDW: 13.8 % (ref 11.5–15.5)
WBC: 11.5 10*3/uL — ABNORMAL HIGH (ref 4.0–10.5)
nRBC: 0 % (ref 0.0–0.2)

## 2020-09-28 LAB — BASIC METABOLIC PANEL
Anion gap: 9 (ref 5–15)
BUN: 18 mg/dL (ref 6–20)
CO2: 25 mmol/L (ref 22–32)
Calcium: 9 mg/dL (ref 8.9–10.3)
Chloride: 104 mmol/L (ref 98–111)
Creatinine, Ser: 0.78 mg/dL (ref 0.61–1.24)
GFR, Estimated: >60 mL/min (ref >60)
Glucose, Bld: 115 mg/dL — ABNORMAL HIGH (ref 70–99)
Potassium: 4.6 mmol/L (ref 3.5–5.1)
Sodium: 138 mmol/L (ref 135–145)

## 2020-09-28 MED ORDER — SENNA 8.6 MG PO TABS: 1.0000 | ORAL_TABLET | Freq: Two times a day (BID) | ORAL | 0 refills | Status: DC

## 2020-09-28 MED ORDER — DOCUSATE SODIUM 100 MG PO CAPS: 100.0000 mg | ORAL_CAPSULE | Freq: Two times a day (BID) | ORAL | 0 refills | Status: DC

## 2020-09-28 MED ORDER — ONDANSETRON HCL 4 MG PO TABS: 4.0000 mg | ORAL_TABLET | Freq: Four times a day (QID) | ORAL | 0 refills | Status: DC | PRN

## 2020-09-28 MED ORDER — ASPIRIN 81 MG PO CHEW: 81.0000 mg | CHEWABLE_TABLET | Freq: Two times a day (BID) | ORAL | 0 refills | Status: AC

## 2020-09-28 MED ORDER — HYDROCODONE-ACETAMINOPHEN 7.5-325 MG PO TABS: 1.0000 | ORAL_TABLET | ORAL | 0 refills | Status: DC | PRN

## 2020-09-28 NOTE — Plan of Care (Signed)
Plan of care reviewed and discussed with the patient. 

## 2020-09-28 NOTE — Evaluation (Signed)
Physical Therapy Evaluation Patient Details Name: Michael [name] MRN: [id number] DOB: 24-Feb-1961 Today's Date: 09/28/2020   History of Present Illness  Pt is a 60 year old male s/p left total knee arthroplasty revision with PMH significant for HTN, previous left knee surgeries, Lt RCR, Lt THA  Clinical Impression  Patient is s/p above surgery resulting in functional limitations due to the deficits listed below (see PT Problem List).  Patient will benefit from skilled PT to increase their independence and safety with mobility to allow discharge to the venue listed below.  Pt ambulated in hallway and performed LE exercises.  Pt anticipates d/c home tomorrow and plans to f/u with OPPT.     Follow Up Recommendations Follow surgeon's recommendation for DC plan and follow-up therapies    Equipment Recommendations  None recommended by PT    Recommendations for Other Services       Precautions / Restrictions Precautions Precautions: Fall;Knee Precaution Comments: hemovac, NPWT to knee incision Restrictions Weight Bearing Restrictions: No      Mobility  Bed Mobility Overal bed mobility: Needs Assistance Bed Mobility: Supine to Sit     Supine to sit: Min guard;HOB elevated     General bed mobility comments: min/guard for lines only    Transfers Overall transfer level: Needs assistance Equipment used: Rolling walker (2 wheeled) Transfers: Sit to/from Stand Sit to Stand: Min guard         General transfer comment: verbal cues for UE and LE positioning  Ambulation/Gait Ambulation/Gait assistance: Min guard Gait Distance (Feet): 160 Feet Assistive device: Rolling walker (2 wheeled) Gait Pattern/deviations: Step-to pattern;Step-through pattern;Antalgic     General Gait Details: verbal cues for sequence, RW positioning, step length  Stairs            Wheelchair Mobility    Modified Rankin (Stroke Patients Only)       Balance                                              Pertinent Vitals/Pain Pain Assessment: 0-10 Pain Score: 7  Pain Location: left knee Pain Descriptors / Indicators: Aching;Sore Pain Intervention(s): Repositioned;Monitored during session;Ice applied;Premedicated before session    Home Living Family/patient expects to be discharged to:: Private residence Living Arrangements: Non-relatives/Friends   Type of Home: House Home Access: Stairs to enter Entrance Stairs-Rails: None Entrance Stairs-Number of Steps: 1 Home Layout: One level Home Equipment: Environmental consultant - 2 wheels;Cane - single point;Bedside commode;Shower seat      Prior Function Level of Independence: Independent               Hand Dominance        Extremity/Trunk Assessment        Lower Extremity Assessment Lower Extremity Assessment: LLE deficits/detail LLE Deficits / Details: approx -7-80* AAROM, unable to perform SLR       Communication   Communication: No difficulties  Cognition Arousal/Alertness: Awake/alert Behavior During Therapy: WFL for tasks assessed/performed Overall Cognitive Status: Within Functional Limits for tasks assessed                                        General Comments      Exercises Total Joint Exercises Ankle Circles/Pumps: AROM;Both;10 reps Quad Sets: AROM;Both;10 reps Short Arc QuadBarbaraann Boys;Left;10  reps Heel Slides: AAROM;Left;10 reps Hip ABduction/ADduction: AAROM;Left;10 reps Straight Leg Raises: AAROM;Left;10 reps Knee Flexion: AAROM;Seated;Left;10 reps   Assessment/Plan    PT Assessment Patient needs continued PT services  PT Problem List Decreased strength;Decreased mobility;Decreased range of motion;Decreased knowledge of use of DME;Pain       PT Treatment Interventions Gait training;Stair training;Therapeutic exercise;DME instruction;Functional mobility training;Therapeutic activities;Patient/family education    PT Goals (Current goals can be found in the  Care Plan section)  Acute Rehab PT Goals PT Goal Formulation: With patient Time For Goal Achievement: 10/05/20 Potential to Achieve Goals: Good    Frequency 7X/week   Barriers to discharge        Co-evaluation               AM-PAC PT "6 Clicks" Mobility  Outcome Measure Help needed turning from your back to your side while in a flat bed without using bedrails?: A Little Help needed moving from lying on your back to sitting on the side of a flat bed without using bedrails?: A Little Help needed moving to and from a bed to a chair (including a wheelchair)?: A Little Help needed standing up from a chair using your arms (e.g., wheelchair or bedside chair)?: A Little Help needed to walk in hospital room?: A Little Help needed climbing 3-5 steps with a railing? : A Little 6 Click Score: 18    End of Session Equipment Utilized During Treatment: Gait belt Activity Tolerance: Patient tolerated treatment well Patient left: in chair;with call bell/phone within reach;with chair alarm set   PT Visit Diagnosis: Other abnormalities of gait and mobility (R26.89)    Time: 1517-6160 PT Time Calculation (min) (ACUTE ONLY): 23 min   Charges:   PT Evaluation $PT Eval Low Complexity: 1 Low PT Treatments $Therapeutic Exercise: 8-22 mins        Thomasene Mohair PT, DPT Acute Rehabilitation Services Pager: [phone] Office: [phone]   Haylyn Halberg,KATHrine E 09/28/2020, 1:04 PM

## 2020-09-28 NOTE — TOC Transition Note (Signed)
Transition of Care [facility]) - CM/SW Discharge Note   Patient Details  Name: Michael [name] MRN: [id number] Date of Birth: 1960/06/30  Transition of Care [facility]) CM/SW Contact:  Lennart Pall, LCSW Phone Number: 09/28/2020, 12:33 PM   Clinical Narrative:    Met briefly with pt and confirming he has all needed DME at home.  Plan for OPPT at [facility].  No TOC needs.   Final next level of care: OP Rehab Barriers to Discharge: No Barriers Identified   Patient Goals and CMS Choice Patient states their goals for this hospitalization and ongoing recovery are:: return home      Discharge Placement                       Discharge Plan and Services                DME Arranged: N/A DME Agency: NA                  Social Determinants of Health (SDOH) Interventions     Readmission Risk Interventions No flowsheet data found.

## 2020-09-28 NOTE — Plan of Care (Signed)
Plan of care discussed with pt.

## 2020-09-28 NOTE — Progress Notes (Signed)
Physical Therapy Treatment Patient Details Name: Michael [name] MRN: [id number] DOB: 20-Jul-1960 Today's Date: 09/28/2020    History of Present Illness Pt is a 60 year old male s/p left total knee arthroplasty revision with PMH significant for HTN, previous left knee surgeries, Lt RCR, Lt THA    PT Comments    Pt ambulated in hallway and practiced one step.  Pt mobilizing well and eager for d/c home.  Pt provided with HEP handout per request.  Pt had no further questions.   Follow Up Recommendations  Follow surgeon's recommendation for DC plan and follow-up therapies     Equipment Recommendations  None recommended by PT    Recommendations for Other Services       Precautions / Restrictions Precautions Precautions: Fall;Knee Precaution Comments: NPWT to knee incision Restrictions Weight Bearing Restrictions: No    Mobility  Bed Mobility Overal bed mobility: Needs Assistance Bed Mobility: Supine to Sit     Supine to sit: Min guard;HOB elevated     General bed mobility comments: pt in recliner    Transfers Overall transfer level: Needs assistance Equipment used: Rolling walker (2 wheeled) Transfers: Sit to/from Stand Sit to Stand: Supervision         General transfer comment: verbal cues for UE and LE positioning  Ambulation/Gait Ambulation/Gait assistance: Supervision Gait Distance (Feet): 240 Feet Assistive device: Rolling walker (2 wheeled) Gait Pattern/deviations: Step-through pattern;Antalgic;Decreased stance time - left     General Gait Details: verbal cues for RW positioning, step length   Stairs Stairs: Yes Stairs assistance: Min guard Stair Management: Step to pattern;Forwards;With walker Number of Stairs: 1 General stair comments: verbal cues for RW positioning and sequence   Wheelchair Mobility    Modified Rankin (Stroke Patients Only)       Balance                                            Cognition  Arousal/Alertness: Awake/alert Behavior During Therapy: WFL for tasks assessed/performed Overall Cognitive Status: Within Functional Limits for tasks assessed                                        Exercises    General Comments        Pertinent Vitals/Pain Pain Assessment: 0-10 Pain Score: 5  Pain Location: left knee Pain Descriptors / Indicators: Aching;Sore Pain Intervention(s): Monitored during session;Premedicated before session;Repositioned;Ice applied    Home Living                      Prior Function            PT Goals (current goals can now be found in the care plan section) Acute Rehab PT Goals PT Goal Formulation: With patient Time For Goal Achievement: 10/05/20 Potential to Achieve Goals: Good Progress towards PT goals: Progressing toward goals    Frequency    7X/week      PT Plan Current plan remains appropriate    Co-evaluation              AM-PAC PT "6 Clicks" Mobility   Outcome Measure  Help needed turning from your back to your side while in a flat bed without using bedrails?: A Little Help needed moving from lying on your  back to sitting on the side of a flat bed without using bedrails?: A Little Help needed moving to and from a bed to a chair (including a wheelchair)?: A Little Help needed standing up from a chair using your arms (e.g., wheelchair or bedside chair)?: A Little Help needed to walk in hospital room?: A Little Help needed climbing 3-5 steps with a railing? : A Little 6 Click Score: 18    End of Session Equipment Utilized During Treatment: Gait belt Activity Tolerance: Patient tolerated treatment well Patient left: in chair;with call bell/phone within reach Nurse Communication: Mobility status PT Visit Diagnosis: Other abnormalities of gait and mobility (R26.89)     Time: 1610-9604 PT Time Calculation (min) (ACUTE ONLY): 10 min  Charges:  $Gait Training: 8-22 mins          Paulino Door,  DPT Acute Rehabilitation Services Pager: [phone] Office: [phone]  Maida Sale E 09/28/2020, 3:52 PM

## 2020-09-28 NOTE — Progress Notes (Signed)
    Subjective:  Patient reports pain as mild to moderate.  Denies N/V/CP/SOB.   Objective:   VITALS:   Vitals:   09/27/20 1827 09/27/20 2025 09/28/20 0136 09/28/20 0637  BP: 122/87 121/68 (!) 120/54 126/80  Pulse: 75 81 77 67  Resp: 20 14 14 16   Temp: 97.8 F (36.6 C) 98.1 F (36.7 C) 97.8 F (36.6 C) 98.1 F (36.7 C)  TempSrc: Oral Oral Oral Oral  SpO2: 96% 95% 99% 99%  Weight:      Height:        NAD ABD soft Neurovascular intact Sensation intact distally Intact pulses distally Dorsiflexion/Plantar flexion intact Incision: dressing C/D/I Incisional vac in place and functioning Hemovac in place  Lab Results  Component Value Date   WBC 11.5 (H) 09/28/2020   HGB 11.3 (L) 09/28/2020   HCT 34.3 (L) 09/28/2020   MCV 89.1 09/28/2020   PLT 238 09/28/2020   BMET    Component Value Date/Time   NA 138 09/28/2020 0318   NA 137 03/30/2020 1456   K 4.6 09/28/2020 0318   CL 104 09/28/2020 0318   CO2 25 09/28/2020 0318   GLUCOSE 115 (H) 09/28/2020 0318   BUN 18 09/28/2020 0318   BUN 16 03/30/2020 1456   CREATININE 0.78 09/28/2020 0318   CALCIUM 9.0 09/28/2020 0318   GFRNONAA >60 09/28/2020 0318   GFRAA 114 03/30/2020 1456     Assessment/Plan: 1 Day Post-Op   Principal Problem:   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

## 2020-09-29 ENCOUNTER — Telehealth: Payer: Self-pay

## 2020-09-29 ENCOUNTER — Other Ambulatory Visit: Payer: Self-pay | Admitting: Internal Medicine

## 2020-09-29 DIAGNOSIS — F17200 Nicotine dependence, unspecified, uncomplicated: Secondary | ICD-10-CM

## 2020-09-29 NOTE — Telephone Encounter (Signed)
Transition Care Management Follow-up Telephone Call  Date of discharge and from where:[facility] on 05.05.2022 How have you been since you were released from the hospital? [facility]   Any questions or concerns? No questions/concerns reported.  Items Reviewed: Did the pt receive and understand the discharge instructions provided? have the instructions and have no questions.  Medications obtained and verified? He said that he have the medication list and the hospital staff reviewed them in detail prior to discharge. Any new allergies since your discharge? None reported  Do you have support at home? Yes, friend Other (ie: DME, Home Health, etc)    Confirmed he has all needed DME at home  Functional Questionnaire: (I = Independent and D = Dependent) ADL's:  Independent.     Follow up appointments reviewed:   PCP Hospital f/u appt confirmed? Dr Laural Benes on 10/30/2020.  Specialist Hospital f/u appt confirmed? scheduled at this time  Are transportation arrangements needed? have transportation   If their condition worsens, is the pt aware to call  their PCP or go to the ED? Yes.Made pt aware if condition worsen or start experiencing rapid weight gain, chest pain, diff breathing, SOB, high fevers, or bleading to refer imediately to ED for further evaluation.  Was the patient provided with contact information for the PCP's office or ED? He has the phone number  Was the pt encouraged to call back with questions or concerns?yes

## 2020-09-30 LAB — BODY FLUID CULTURE W GRAM STAIN: Culture: NO GROWTH

## 2020-10-02 LAB — AEROBIC/ANAEROBIC CULTURE W GRAM STAIN (SURGICAL/DEEP WOUND)
Culture: NO GROWTH
Culture: NO GROWTH
Gram Stain: NONE SEEN

## 2020-10-02 NOTE — Discharge Summary (Signed)
Physician Discharge Summary  Patient ID: Michael [name] MRN: [id number] DOB/AGE: November 01, 1960 60 y.o.  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 Arthritis   . Hepatitis    Hep C rx took treatment.  . Hypertension   . Pre-diabetes     Surgeries: Procedure(s): Left total knee arthroplasty revision on 09/27/2020   Consultants (if any):   Discharged Condition: Improved  Hospital Course: Michael [name] is an 60 y.o. 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    He was given perioperative antibiotics:  Anti-infectives (From admission, onward)   Start     Dose/Rate Route Frequency Ordered Stop   09/27/20 1930  ceFAZolin (ANCEF) IVPB 2g/100 mL premix        2 g 200 mL/hr over 30 Minutes Intravenous Every 6 hours 09/27/20 1830 09/28/20 0210   09/27/20 1114  ceFAZolin (ANCEF) 3-0.9 GM/100ML-% IVPB       Note to Pharmacy: Montel ClockBenfield, Lindsay   : cabinet override      09/27/20 1114 09/27/20 2329   09/27/20 0915  ceFAZolin (ANCEF) IVPB 3g/100 mL premix  Status:  Discontinued        3 g 200 mL/hr over 30 Minutes Intravenous On call to O.R. 09/27/20 [id number] 09/27/20 1812    .  He was given sequential compression devices, early ambulation, and aspirin for DVT prophylaxis.  He benefited maximally from the hospital stay and there were no complications.    Recent vital signs:  Vitals:   09/28/20 0637 09/28/20 0856  BP: 126/80 (!) 148/75  Pulse: 67 78  Resp: 16 16  Temp: 98.1 F (36.7 C) 97.7 F (36.5 C)  SpO2: 99% 98%    Recent laboratory studies:  Lab Results  Component Value Date   HGB  11.3 (L) 09/28/2020   HGB 13.5 09/22/2020   HGB 13.3 09/07/2020   Lab Results  Component Value Date   WBC 11.5 (H) 09/28/2020   PLT 238 09/28/2020   Lab Results  Component Value Date   INR 1.0 09/22/2020   Lab Results  Component Value Date   NA 138 09/28/2020   K 4.6 09/28/2020   CL 104 09/28/2020   CO2 25 09/28/2020   BUN 18 09/28/2020   CREATININE 0.78 09/28/2020   GLUCOSE 115 (H) 09/28/2020     WEIGHT BEARING   Weight bearing as tolerated with assist device (walker, cane, etc) as directed, use it as long as suggested by your surgeon or therapist, typically at least 4-6 weeks.   EXERCISES  Results after joint replacement surgery are often greatly improved when you follow the exercise, range of motion and muscle strengthening exercises prescribed by your doctor. Safety measures are also important to protect the joint from further injury. Any time any of these exercises cause you to have increased pain or swelling, decrease what you are doing until you are comfortable again and then slowly increase them. If you have problems or questions, call your caregiver or physical therapist for advice.   Rehabilitation is important following a joint replacement. After just a few days of immobilization, the muscles of the leg can become weakened  and shrink (atrophy).  These exercises are designed to build up the tone and strength of the thigh and leg muscles and to improve motion. Often times heat used for twenty to thirty minutes before working out will loosen up your tissues and help with improving the range of motion but do not use heat for the first two weeks following surgery (sometimes heat can increase post-operative swelling).   These exercises can be done on a training (exercise) mat, on the floor, on a table or on a bed. Use whatever works the best and is most comfortable for you.    Use music or television while you are exercising so that the exercises are a pleasant break in your  day. This will make your life better with the exercises acting as a break in your routine that you can look forward to.   Perform all exercises about fifteen times, three times per day or as directed.  You should exercise both the operative leg and the other leg as well.  Exercises include:   . Quad Sets - Tighten up the muscle on the front of the thigh (Quad) and hold for 5-10 seconds.   . Straight Leg Raises - With your knee straight (if you were given a brace, keep it on), lift the leg to 60 degrees, hold for 3 seconds, and slowly lower the leg.  Perform this exercise against resistance later as your leg gets stronger.  . Leg Slides: Lying on your back, slowly slide your foot toward your buttocks, bending your knee up off the floor (only go as far as is comfortable). Then slowly slide your foot back down until your leg is flat on the floor again.  Lawanna Kobus Wings: Lying on your back spread your legs to the side as far apart as you can without causing discomfort.  . Hamstring Strength:  Lying on your back, push your heel against the floor with your leg straight by tightening up the muscles of your buttocks.  Repeat, but this time bend your knee to a comfortable angle, and push your heel against the floor.  You may put a pillow under the heel to make it more comfortable if necessary.   A rehabilitation program following joint replacement surgery can speed recovery and prevent re-injury in the future due to weakened muscles. Contact your doctor or a physical therapist for more information on knee rehabilitation.    CONSTIPATION  Constipation is defined medically as fewer than three stools per week and severe constipation as less than one stool per week.  Even if you have a regular bowel pattern at home, your normal regimen is likely to be disrupted due to multiple reasons following surgery.  Combination of anesthesia, postoperative narcotics, change in appetite and fluid intake all can affect your  bowels.   YOU MUST use at least one of the following options; they are listed in order of increasing strength to get the job done.  They are all available over the counter, and you may need to use some, POSSIBLY even all of these options:    Drink plenty of fluids (prune juice may be helpful) and high fiber foods Colace 100 mg by mouth twice a day  Senokot for constipation as directed and as needed Dulcolax (bisacodyl), take with full glass of water  Miralax (polyethylene glycol) once or twice a day as needed.  If you have tried all these things and are unable to have a bowel movement in the first 3-4 days  after surgery call either your surgeon or your primary doctor.    If you experience loose stools or diarrhea, hold the medications until you stool forms back up.  If your symptoms do not get better within 1 week or if they get worse, check with your doctor.  If you experience "the worst abdominal pain ever" or develop nausea or vomiting, please contact the office immediately for further recommendations for treatment.   ITCHING:  If you experience itching with your medications, try taking only a single pain pill, or even half a pain pill at a time.  You can also use Benadryl over the counter for itching or also to help with sleep.   TED HOSE STOCKINGS:  Use stockings on both legs until for at least 2 weeks or as directed by physician office. They may be removed at night for sleeping.  MEDICATIONS:  See your medication summary on the "After Visit Summary" that nursing will review with you.  You may have some home medications which will be placed on hold until you complete the course of blood thinner medication.  It is important for you to complete the blood thinner medication as prescribed.  PRECAUTIONS:  If you experience chest pain or shortness of breath - call 911 immediately for transfer to the hospital emergency department.   If you develop a fever greater that 101 F, purulent drainage  from wound, increased redness or drainage from wound, foul odor from the wound/dressing, or calf pain - CONTACT YOUR SURGEON.      POST-OPERATIVE OPIOID TAPER INSTRUCTIONS: . It is important to wean off of your opioid medication as soon as possible. If you do not need pain medication after your surgery it is ok to stop day one. [name] Opioids include: o Codeine, Hydrocodone(Norco, Vicodin), Oxycodone(Percocet, oxycontin) and hydromorphone amongst others.  . Long term and even short term use of opiods can cause: o Increased pain response o Dependence o Constipation o Depression o Respiratory depression o And more.  . Withdrawal symptoms can include o Flu like symptoms o Nausea, vomiting o And more . Techniques to manage these symptoms o Hydrate well o Eat regular healthy meals o Stay active o Use relaxation techniques(deep breathing, meditating, yoga) . Do Not substitute Alcohol to help with tapering . If you have been on opioids for less than two weeks and do not have pain than it is ok to stop all together.  . Plan to wean off of opioids o This plan should start within one week post op of your joint replacement. o Maintain the same interval or time between taking each dose and first decrease the dose.  o Cut the total daily intake of opioids by one tablet each day o Next start to increase the time between doses. o The last dose that should be eliminated is the evening dose.                                                 FOLLOW-UP APPOINTMENTS:  If you do not already have a post-op appointment, please call the office for an appointment to be seen by your surgeon.  Guidelines for how soon to be seen are listed in your "After Visit Summary", but are typically between 1-4 weeks after surgery.  OTHER INSTRUCTIONS:   Knee Replacement:  Do not place pillow under knee, focus  on keeping the knee straight while resting. CPM instructions: 0-90 degrees, 2 hours in the morning, 2 hours in the  afternoon, and 2 hours in the evening. Place foam block, curve side up under heel at all times except when in CPM or when walking.  DO NOT modify, tear, cut, or change the foam block in any way.   MAKE SURE YOU:  . Understand these instructions.  . Get help right away if you are not doing well or get worse.    Thank you for letting us be a part of your medical care team.  It is a privilege we respect greatly.  We hope these instructions will help you stay on track for a fast and full recovery!   Diagnostic Studies: DG Knee 1-2 Views Left  Result Date: 09/27/2020 CLINICAL DATA:  Check placement of spacing device during left knee revision EXAM: LEFT KNEE - 1-2 VIEW COMPARISON:  None. FINDINGS: Previously seen prosthesis has been removed. A spacing device is noted within the proximal and mid tibial shaft. IMPRESSION: Spacing device is noted within the tibial shaft. Electronically Signed   By: Alcide Clever M.D.   On: 09/27/2020 15:51   DG Knee Left Port  Result Date: 09/27/2020 CLINICAL DATA:  Status post left knee revision EXAM: PORTABLE LEFT KNEE - 1-2 VIEW COMPARISON:  Intraop films from earlier in the same day. FINDINGS: New tibial and femoral components are noted in satisfactory position. No acute bony or soft tissue abnormality is noted. IMPRESSION: Status post left knee revision. Electronically Signed   By: Alcide Clever M.D.   On: 09/27/2020 18:46    Disposition: Discharge disposition: 01-Home or Self Care       Discharge Instructions    Call MD / Call 911   Complete by: As directed    If you experience chest pain or shortness of breath, CALL 911 and be transported to the hospital emergency room.  If you develope a fever above 101 F, pus (white drainage) or increased drainage or redness at the wound, or calf pain, call your surgeon's office.   Constipation Prevention   Complete by: As directed    Drink plenty of fluids.  Prune juice may be helpful.  You may use a stool softener, such  as Colace (over the counter) 100 mg twice a day.  Use MiraLax (over the counter) for constipation as needed.   Diet - low sodium heart healthy   Complete by: As directed    Do not put a pillow under the knee. Place it under the heel.   Complete by: As directed    Driving restrictions   Complete by: As directed    No driving for 6 weeks   Increase activity slowly as tolerated   Complete by: As directed    Lifting restrictions   Complete by: As directed    No lifting for 6 weeks   Post-operative opioid taper instructions:   Complete by: As directed    POST-OPERATIVE OPIOID TAPER INSTRUCTIONS: It is important to wean off of your opioid medication as soon as possible. If you do not need pain medication after your surgery it is ok to stop day one. Opioids include: Codeine, Hydrocodone(Norco, Vicodin), Oxycodone(Percocet, oxycontin) and hydromorphone amongst others.  Long term and even short term use of opiods can cause: Increased pain response Dependence Constipation Depression Respiratory depression And more.  Withdrawal symptoms can include Flu like symptoms Nausea, vomiting And more Techniques to manage these symptoms Hydrate well Eat  regular healthy meals Stay active Use relaxation techniques(deep breathing, meditating, yoga) Do Not substitute Alcohol to help with tapering If you have been on opioids for less than two weeks and do not have pain than it is ok to stop all together.  Plan to wean off of opioids This plan should start within one week post op of your joint replacement. Maintain the same interval or time between taking each dose and first decrease the dose.  Cut the total daily intake of opioids by one tablet each day Next start to increase the time between doses. The last dose that should be eliminated is the evening dose.      TED hose   Complete by: As directed    Use stockings (TED hose) for 2 weeks on both leg(s).  You may remove them at night for  sleeping.    Dental Antibiotics:  In most cases prophylactic antibiotics for Dental procdeures after total joint surgery are not necessary.  Exceptions are as follows:  1. History of prior total joint infection  2. Severely immunocompromised (Organ Transplant, cancer chemotherapy, Rheumatoid biologic meds such as Humera)  3. Poorly controlled diabetes (A1C &gt; 8.0, blood glucose over 200)  If you have one of these conditions, contact your surgeon for an antibiotic prescription, prior to your dental procedure.   Follow-up Information    Swinteck, Arlys John, MD. Schedule an appointment as soon as possible for a visit on 10/06/2020.   Specialty: Orthopedic Surgery Why: [facility] removal Contact information: [address] [place] [address] [phone]                Signed: Darrick Grinder 10/02/2020, 10:19 AM

## 2020-10-03 ENCOUNTER — Encounter (HOSPITAL_COMMUNITY): Payer: Self-pay | Admitting: Orthopedic Surgery

## 2020-10-10 ENCOUNTER — Other Ambulatory Visit: Payer: Self-pay | Admitting: Internal Medicine

## 2020-10-10 DIAGNOSIS — I10 Essential (primary) hypertension: Secondary | ICD-10-CM

## 2020-10-10 NOTE — Telephone Encounter (Signed)
Pt has HFU appt 10/30/20

## 2020-10-18 ENCOUNTER — Other Ambulatory Visit: Payer: Self-pay | Admitting: Internal Medicine

## 2020-10-18 DIAGNOSIS — I10 Essential (primary) hypertension: Secondary | ICD-10-CM

## 2020-10-30 ENCOUNTER — Ambulatory Visit (HOSPITAL_BASED_OUTPATIENT_CLINIC_OR_DEPARTMENT_OTHER): Payer: 59 | Admitting: Internal Medicine

## 2020-10-30 ENCOUNTER — Other Ambulatory Visit: Payer: Self-pay

## 2020-10-30 DIAGNOSIS — Z09 Encounter for follow-up examination after completed treatment for conditions other than malignant neoplasm: Secondary | ICD-10-CM

## 2020-10-30 DIAGNOSIS — D649 Anemia, unspecified: Secondary | ICD-10-CM

## 2020-10-30 DIAGNOSIS — F17200 Nicotine dependence, unspecified, uncomplicated: Secondary | ICD-10-CM

## 2020-10-30 DIAGNOSIS — I10 Essential (primary) hypertension: Secondary | ICD-10-CM

## 2020-10-30 NOTE — Progress Notes (Addendum)
Virtual Visit via Telephone Note  I connected with [name] on 10/30/2020 at 11:41 AM by telephone and verified that I am speaking with the correct person using two identifiers  Location: Patient: home Provider: office  Participants: Myself Patient   I discussed the limitations, risks, security and privacy concerns of performing an evaluation and management service by telephone and the availability of in person appointments. I also discussed with the patient that there may be a patient responsible charge related to this service. The patient expressed understanding and agreed to proceed.   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  This is a TOC visit. Date hosp 5/4-09/28/2020 Date of call from RN post hosp: 09/29/2020  Patient was hospitalized for surgery on the left knee for revision of total knee replacement.  He was found to have loosening of his prosthesis.  He had postop anemia with H/H of 11.3/34.  He denies any dizziness.  Today he reports that he is doing well.  He goes to physical therapy 2 times a week.   Tob dep:  Doing well on Chantix.  Smoked only a few times since he last saw me.  He had called for refill on Chantix.  HTN:  Checks BP daily.  Recent readings 120/72, 120/83.  Limits salt in foods. No CP/SOB.   Observations/Objective: Lab Results  Component Value Date   WBC 11.5 (H) 09/28/2020   HGB 11.3 (L) 09/28/2020   HCT 34.3 (L) 09/28/2020   MCV 89.1 09/28/2020   PLT 238 09/28/2020     Assessment and Plan: 1. Hospital discharge follow-up Doing well posthospitalization.  He follows up with the orthopedic surgeon.  2. Essential hypertension Home blood pressure readings are at goal.  He will continue amlodipine, hydralazine and lisinopril/HCTZ  3. Tobacco dependence Commended him on his progress.  He  will continue the Chantix for several more weeks  4. Normocytic anemia Most likely due to blood loss intraoperatively.  We will recheck his CBC to see if H/H has rebounded. - CBC; Future  Addendum 11/02/2020: Patient still with mild anemia.  We will add iron studies. Follow Up Instructions: 4 mths   I discussed the assessment and treatment plan with the patient. The patient was provided an opportunity to ask questions and all were answered. The patient agreed with the plan and demonstrated an understanding of the instructions.   The patient was advised to call back or seek an in-person evaluation if the symptoms worsen or if the condition fails to improve as anticipated.  I  Spent 6 minutes on this telephone encounter  Jonah Blue, MD

## 2020-11-01 ENCOUNTER — Ambulatory Visit: Payer: 59 | Attending: Internal Medicine

## 2020-11-01 ENCOUNTER — Other Ambulatory Visit: Payer: Self-pay

## 2020-11-01 DIAGNOSIS — D649 Anemia, unspecified: Secondary | ICD-10-CM

## 2020-11-02 ENCOUNTER — Encounter: Payer: Self-pay | Admitting: Internal Medicine

## 2020-11-02 LAB — CBC
Hematocrit: 36 % — ABNORMAL LOW (ref 37.5–51.0)
Hemoglobin: 11.6 g/dL — ABNORMAL LOW (ref 13.0–17.7)
MCH: 27.4 pg (ref 26.6–33.0)
MCHC: 32.2 g/dL (ref 31.5–35.7)
MCV: 85 fL (ref 79–97)
Platelets: 404 10*3/uL (ref 150–450)
RBC: 4.24 x10E6/uL (ref 4.14–5.80)
RDW: 13.9 % (ref 11.6–15.4)
WBC: 8.5 10*3/uL (ref 3.4–10.8)

## 2020-11-02 NOTE — Addendum Note (Signed)
Addended by: Jonah Blue B on: 11/02/2020 07:54 AM   Modules accepted: Orders

## 2020-11-02 NOTE — Progress Notes (Signed)
Let patient know that he still has a mild anemia.  We will add iron level to see whether this is iron deficiency anemia.

## 2020-11-06 ENCOUNTER — Ambulatory Visit: Payer: 59 | Attending: Internal Medicine

## 2020-11-06 ENCOUNTER — Other Ambulatory Visit: Payer: Self-pay

## 2020-11-06 DIAGNOSIS — D649 Anemia, unspecified: Secondary | ICD-10-CM

## 2020-11-07 ENCOUNTER — Encounter: Payer: Self-pay | Admitting: Internal Medicine

## 2020-11-07 ENCOUNTER — Other Ambulatory Visit: Payer: Self-pay | Admitting: Internal Medicine

## 2020-11-07 DIAGNOSIS — F17200 Nicotine dependence, unspecified, uncomplicated: Secondary | ICD-10-CM

## 2020-11-07 LAB — IRON,TIBC AND FERRITIN PANEL
Ferritin: 204 ng/mL (ref 30–400)
Iron Saturation: 18 % (ref 15–55)
Iron: 55 ug/dL (ref 38–169)
Total Iron Binding Capacity: 306 ug/dL (ref 250–450)
UIBC: 251 ug/dL (ref 111–343)

## 2020-11-19 ENCOUNTER — Other Ambulatory Visit: Payer: Self-pay | Admitting: Internal Medicine

## 2020-11-19 DIAGNOSIS — I10 Essential (primary) hypertension: Secondary | ICD-10-CM

## 2020-11-19 NOTE — Telephone Encounter (Signed)
Requested Prescriptions  Pending Prescriptions Disp Refills  . amLODipine (NORVASC) 10 MG tablet [Pharmacy Med Name: AMLODIPINE BESYLATE 10 MG TAB] 90 tablet 0    Sig: TAKE 1 TABLET BY MOUTH EVERY DAY     Cardiovascular:  Calcium Channel Blockers Failed - 11/19/2020  7:57 AM      Failed - Last BP in normal range    BP Readings from Last 1 Encounters:  09/28/20 (!) 148/75         Passed - Valid encounter within last 6 months    Recent Outpatient Visits          2 weeks ago Hospital discharge follow-up   [facility] Marcine Matar, MD   2 months ago Encounter for preoperative assessment   [facility] Marcine Matar, MD   3 months ago Essential hypertension   [facility] Lois Huxley, Cornelius Moras, RPH-CPP   3 months ago Essential hypertension   [facility] Marcine Matar, MD   6 months ago Pre-operative clearance   [facility] Marcine Matar, MD      Future Appointments            In 3 months Laural Benes, Binnie Rail, MD [facility]           . hydrALAZINE (APRESOLINE) 10 MG tablet [Pharmacy Med Name: HYDRALAZINE 10 MG TABLET] 60 tablet 0    Sig: TAKE 1 TABLET BY MOUTH TWICE A DAY     Cardiovascular:  Vasodilators Failed - 11/19/2020  7:57 AM      Failed - HCT in normal range and within 360 days    Hematocrit  Date Value Ref Range Status  11/01/2020 36.0 (L) 37.5 - 51.0 % Final         Failed - HGB in normal range and within 360 days    Hemoglobin  Date Value Ref Range Status  11/01/2020 11.6 (L) 13.0 - 17.7 g/dL Final         Failed - Last BP in normal range    BP Readings from Last 1 Encounters:  09/28/20 (!) 148/75         Passed - RBC in normal range and within 360 days    RBC  Date Value Ref Range Status  11/01/2020 4.24 4.14 - 5.80 x10E6/uL Final  09/28/2020 3.85 (L) 4.22  - 5.81 MIL/uL Final         Passed - WBC in normal range and within 360 days    WBC  Date Value Ref Range Status  11/01/2020 8.5 3.4 - 10.8 x10E3/uL Final  09/28/2020 11.5 (H) 4.0 - 10.5 K/uL Final         Passed - PLT in normal range and within 360 days    Platelets  Date Value Ref Range Status  11/01/2020 404 150 - 450 x10E3/uL Final         Passed - Valid encounter within last 12 months    Recent Outpatient Visits          2 weeks ago Hospital discharge follow-up   [facility] Marcine Matar, MD   2 months ago Encounter for preoperative assessment   [facility] Marcine Matar, MD   3 months ago Essential hypertension   [facility] [place], Jeannett Senior  L, RPH-CPP   3 months ago Essential hypertension   [facility] Marcine Matar, MD   6 months ago Pre-operative clearance   [facility] Marcine Matar, MD      Future Appointments            In 3 months Laural Benes, Binnie Rail, MD [facility]

## 2020-12-13 ENCOUNTER — Other Ambulatory Visit: Payer: Self-pay | Admitting: Internal Medicine

## 2020-12-13 DIAGNOSIS — I10 Essential (primary) hypertension: Secondary | ICD-10-CM

## 2020-12-13 IMAGING — CR DG CHEST 2V
2 series · 2 of 2 positions shown · non-contrast
Comparison: Radiographs January 26, 2013.

CLINICAL DATA: Preop for total knee replacement.

EXAM:
CHEST - 2 VIEW

[w chest pa]
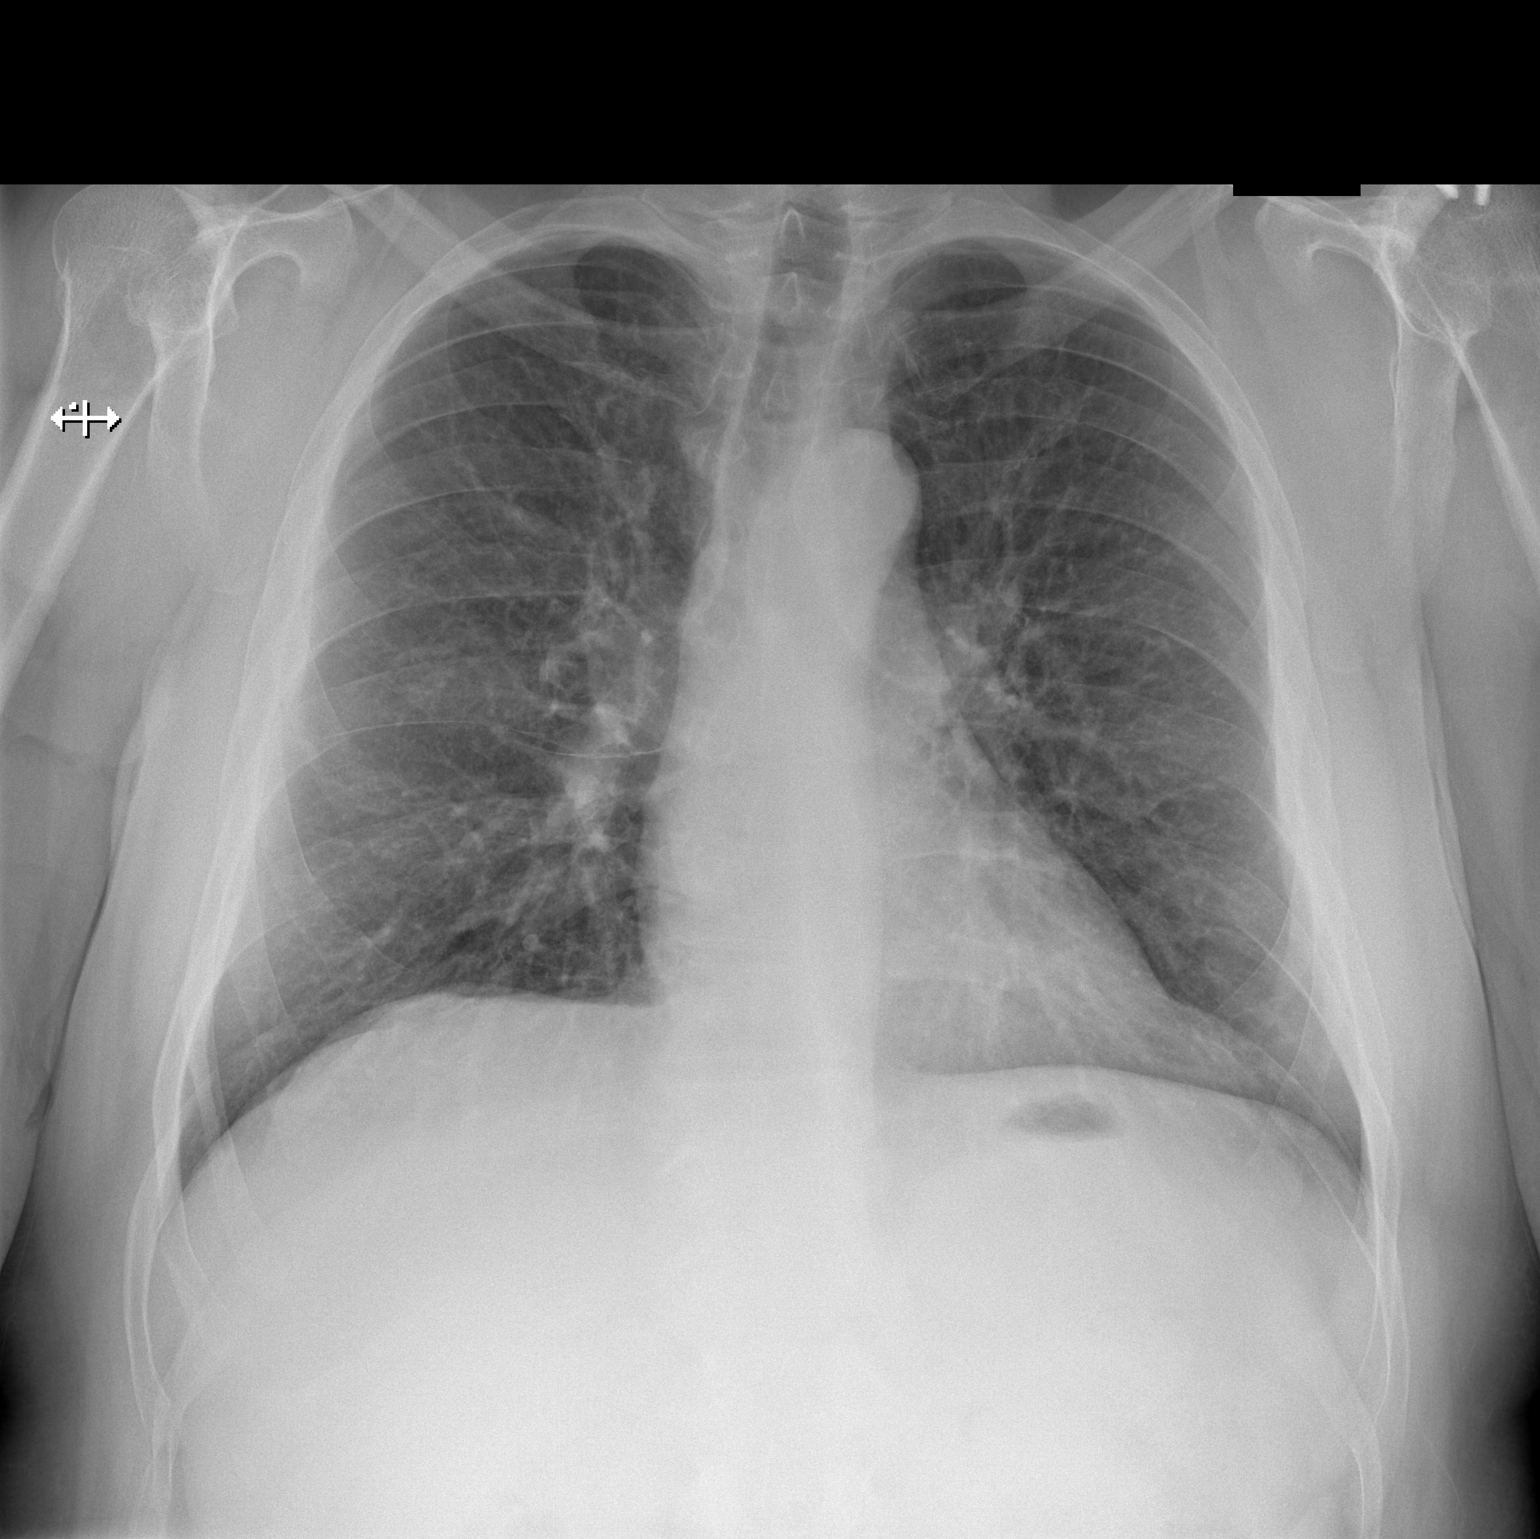

[w chest lat]
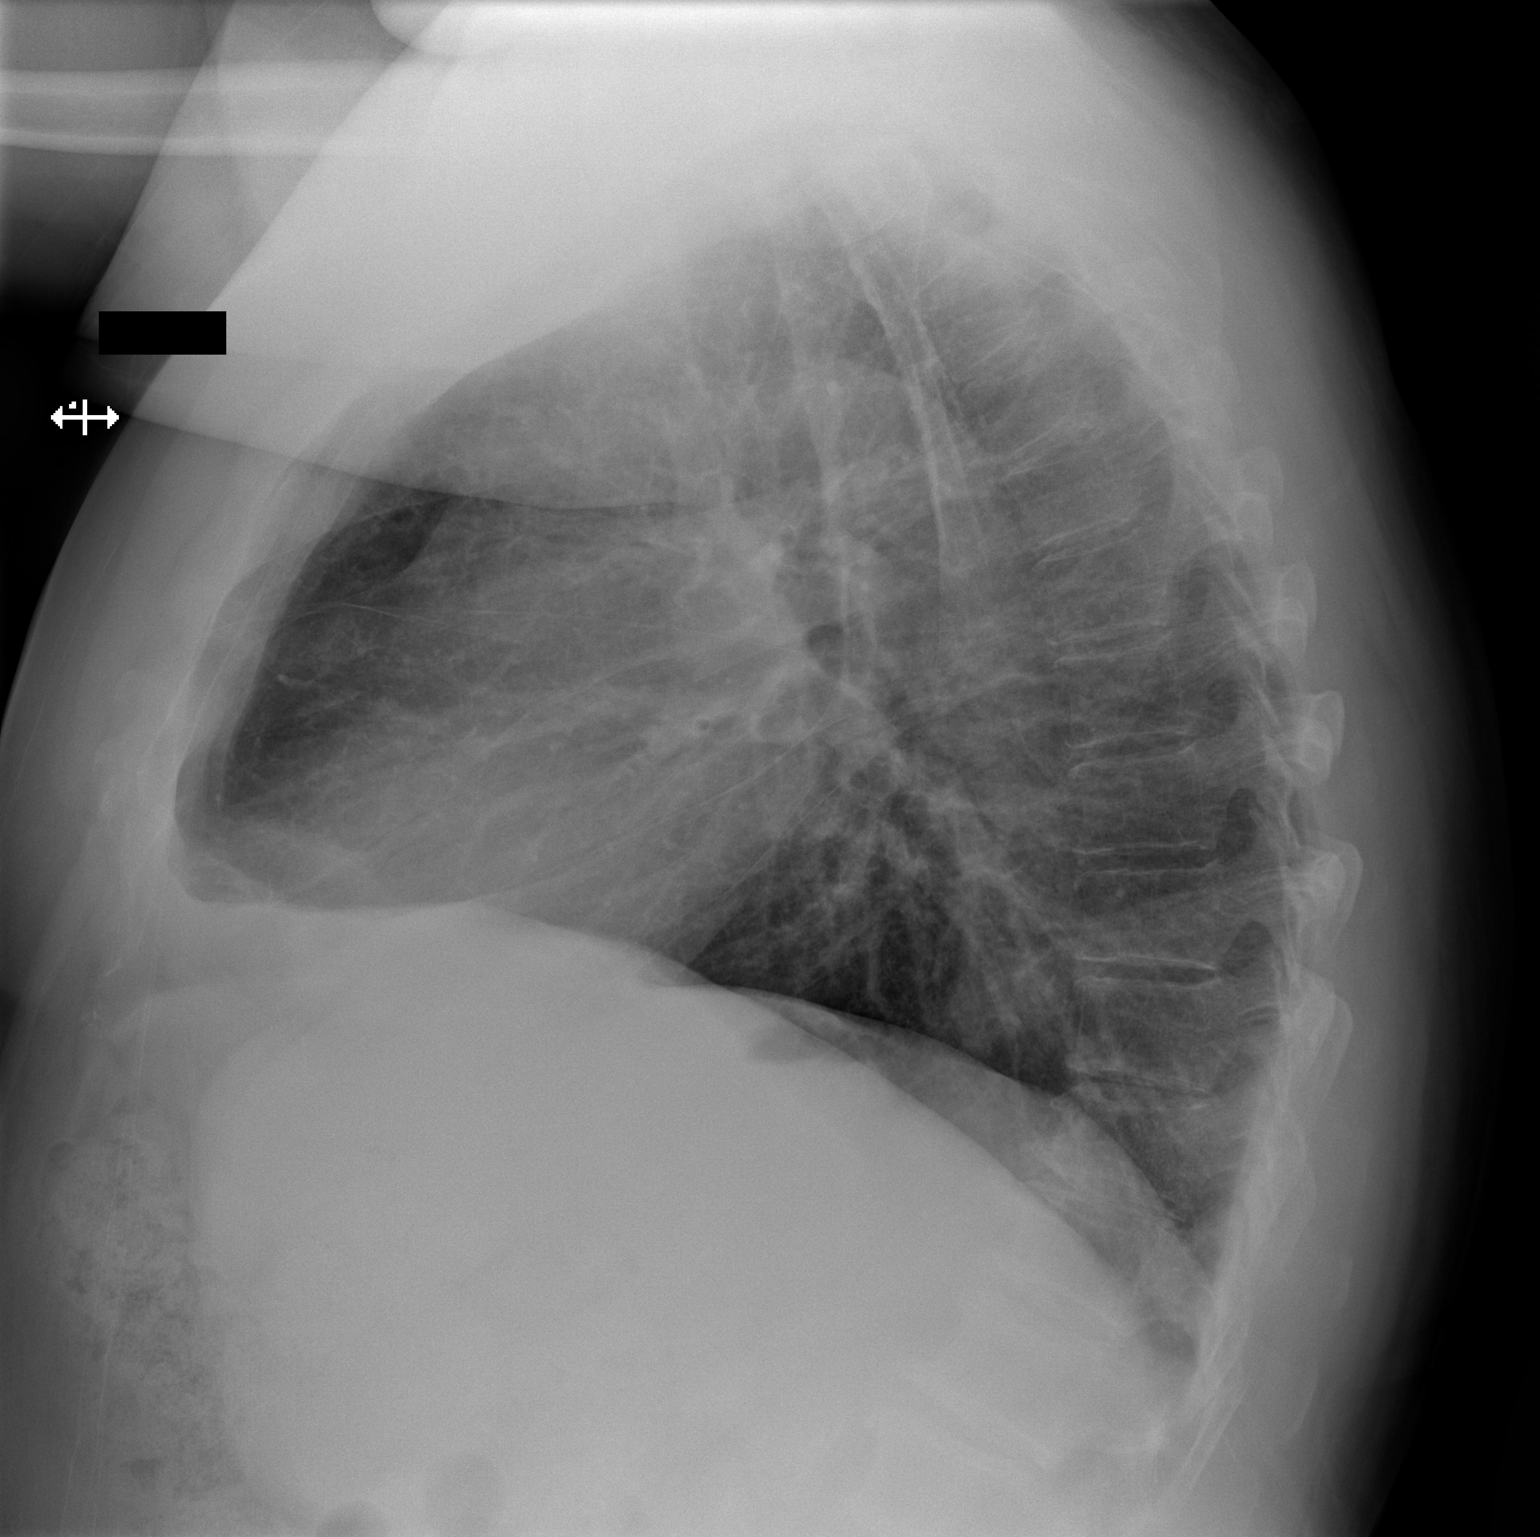

[2 of 2 positions shown; findings below may reference images not displayed]

FINDINGS: The heart size and mediastinal contours are within normal limits.
Both lungs are clear. The visualized skeletal structures are
unremarkable.
IMPRESSION: No active cardiopulmonary disease.

## 2021-01-05 ENCOUNTER — Other Ambulatory Visit: Payer: Self-pay | Admitting: Internal Medicine

## 2021-01-05 DIAGNOSIS — I10 Essential (primary) hypertension: Secondary | ICD-10-CM

## 2021-02-15 ENCOUNTER — Other Ambulatory Visit: Payer: Self-pay | Admitting: Internal Medicine

## 2021-02-15 DIAGNOSIS — E785 Hyperlipidemia, unspecified: Secondary | ICD-10-CM

## 2021-02-16 ENCOUNTER — Other Ambulatory Visit: Payer: Self-pay | Admitting: Internal Medicine

## 2021-02-16 DIAGNOSIS — I10 Essential (primary) hypertension: Secondary | ICD-10-CM

## 2021-03-01 ENCOUNTER — Ambulatory Visit: Payer: 59 | Admitting: Internal Medicine

## 2021-03-13 ENCOUNTER — Other Ambulatory Visit: Payer: Self-pay | Admitting: Internal Medicine

## 2021-03-13 DIAGNOSIS — I10 Essential (primary) hypertension: Secondary | ICD-10-CM

## 2021-03-13 NOTE — Telephone Encounter (Signed)
Requested Prescriptions  Pending Prescriptions Disp Refills  . hydrALAZINE (APRESOLINE) 10 MG tablet [Pharmacy Med Name: HYDRALAZINE 10 MG TABLET] 180 tablet 0    Sig: TAKE 1 TABLET BY MOUTH TWICE A DAY     Cardiovascular:  Vasodilators Failed - 03/13/2021  1:34 AM      Failed - HCT in normal range and within 360 days    Hematocrit  Date Value Ref Range Status  11/01/2020 36.0 (L) 37.5 - 51.0 % Final         Failed - HGB in normal range and within 360 days    Hemoglobin  Date Value Ref Range Status  11/01/2020 11.6 (L) 13.0 - 17.7 g/dL Final         Failed - Last BP in normal range    BP Readings from Last 1 Encounters:  09/28/20 (!) 148/75         Passed - RBC in normal range and within 360 days    RBC  Date Value Ref Range Status  11/01/2020 4.24 4.14 - 5.80 x10E6/uL Final  09/28/2020 3.85 (L) 4.22 - 5.81 MIL/uL Final         Passed - WBC in normal range and within 360 days    WBC  Date Value Ref Range Status  11/01/2020 8.5 3.4 - 10.8 x10E3/uL Final  09/28/2020 11.5 (H) 4.0 - 10.5 K/uL Final         Passed - PLT in normal range and within 360 days    Platelets  Date Value Ref Range Status  11/01/2020 404 150 - 450 x10E3/uL Final         Passed - Valid encounter within last 12 months    Recent Outpatient Visits          4 months ago Hospital discharge follow-up   [facility] Marcine Matar, MD   6 months ago Encounter for preoperative assessment   [facility] Marcine Matar, MD   6 months ago Essential hypertension   [facility] [place], Cornelius Moras, RPH-CPP   7 months ago Essential hypertension   [facility] Marcine Matar, MD   10 months ago Pre-operative clearance   [facility] Marcine Matar, MD      Future Appointments            In 3 days Marcine Matar, MD [facility]

## 2021-03-16 ENCOUNTER — Encounter: Payer: Self-pay | Admitting: Internal Medicine

## 2021-03-16 ENCOUNTER — Ambulatory Visit: Payer: 59 | Attending: Internal Medicine | Admitting: Internal Medicine

## 2021-03-16 ENCOUNTER — Other Ambulatory Visit: Payer: Self-pay

## 2021-03-16 VITALS — BP 132/82 | HR 82 | Resp 16 | Wt 249.0 lb

## 2021-03-16 DIAGNOSIS — Z23 Encounter for immunization: Secondary | ICD-10-CM

## 2021-03-16 DIAGNOSIS — Z87891 Personal history of nicotine dependence: Secondary | ICD-10-CM

## 2021-03-16 DIAGNOSIS — I10 Essential (primary) hypertension: Secondary | ICD-10-CM | POA: Diagnosis not present

## 2021-03-16 DIAGNOSIS — Z6834 Body mass index (BMI) 34.0-34.9, adult: Secondary | ICD-10-CM

## 2021-03-16 DIAGNOSIS — E669 Obesity, unspecified: Secondary | ICD-10-CM | POA: Diagnosis not present

## 2021-03-16 NOTE — Progress Notes (Signed)
Patient ID: Michael [name], male    DOB: 1960-10-26  MRN: [id number]  CC: Hypertension   Subjective: Michael [name] is a 60 y.o. male who presents for chronic ds management His concerns today include:  Pt with hx of HTN, HL, preDM, obesity, OA LT hip and lumbar spine, Hep C treated with interferon/rib combo in [place] 2005, tobacco abuse, substance use disorder on Methadone.  LT knee still swells when he is on his feet  a lot.  But overall he feels it is better.  Obesity/PreDM:  loss 15 lbs since May.  Wants to get to 200 lbs. Walking more. Eating fresh fruits, veggs.  No fried or fast foods.  Cut sodas out.  He tells me a nurse from his health care plan came out to his house about a week ago and checked his blood sugar and it was 89.  He is taking and tolerating atorvastatin.  Tob dep: He states that he slipped up and smoke 1 time when he was at the beach but other than that since his last visit with me he has been free of cigarettes.  Drinks occasionally now  HTN:   when checked last wk was 110/78. Checking QOD 110/75.  Reports compliance with medications that include amlodipine, hydralazine and lisinopril/HCTZ.  HM:  due for flu shot and shingles vaccine. 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   Status post revision of total knee replacement, 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   Unilateral primary osteoarthritis, left hip 09/08/2018   S/P revision of total knee, left 06/15/2018   Positive depression screening 05/22/2017   Hepatitis C virus infection cured after antiviral drug therapy 01/19/2017   Hyperlipidemia 10/23/2016   Hypertension 10/18/2016   Tobacco dependence 10/18/2016   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   Right rotator cuff tear 01/29/2013     Current Outpatient Medications on File Prior to Visit   Medication Sig Dispense Refill   amLODipine (NORVASC) 10 MG tablet TAKE 1 TABLET BY MOUTH EVERY DAY 90 tablet 0   atorvastatin (LIPITOR) 20 MG tablet TAKE 1 TABLET BY MOUTH EVERY DAY 90 tablet 0   hydrALAZINE (APRESOLINE) 10 MG tablet TAKE 1 TABLET BY MOUTH TWICE A DAY 180 tablet 0   lisinopril-hydrochlorothiazide (ZESTORETIC) 20-25 MG tablet TAKE 1 TABLET BY MOUTH EVERY DAY 90 tablet 0   methadone (DOLOPHINE) 10 MG tablet Take 3 tablets (30 mg total) by mouth every 8 (eight) hours. (Patient taking differently: Take 60 mg by mouth daily.) 30 tablet 0   psyllium (METAMUCIL) 58.6 % packet Take 1 packet by mouth daily.     tetrahydrozoline 0.05 % ophthalmic solution Place 1 drop into both eyes 2 (two) times daily as needed (dry eyes).     docusate sodium (COLACE) 100 MG capsule Take 1 capsule (100 mg total) by mouth 2 (two) times daily. 60 capsule 0   HYDROcodone-acetaminophen (NORCO) 7.5-325 MG tablet Take 1-2 tablets by mouth every 4 (four) hours as needed for severe pain (pain score 7-10). (Patient not taking: Reported on 03/16/2021) 50 tablet 0   ondansetron (ZOFRAN) 4 MG tablet Take 1 tablet (4 mg total) by mouth every 6 (six) hours as needed for nausea. 20 tablet 0   senna (SENOKOT) 8.6 MG TABS tablet Take 1 tablet (8.6 mg total) by mouth 2 (two) times daily.  120 tablet 0   varenicline (CHANTIX) 0.5 MG tablet 1 tab PO daily x 3 days then increase to 1 tab BID x 4 days (Patient not taking: No sig reported) 11 tablet 0   varenicline (CHANTIX) 1 MG tablet TAKE 1 TABLET (1 MG TOTAL) BY MOUTH 2 (TWO) TIMES DAILY. START ONCE 0.5 MG DOSE COMPLETED (Patient not taking: Reported on 03/16/2021) 180 tablet 0   [DISCONTINUED] carvedilol (COREG) 6.25 MG tablet Take 1 tablet (6.25 mg total) by mouth 2 (two) times daily with a meal. 60 tablet 3   No current facility-administered medications on file prior to visit.    No Known Allergies  Social History   Socioeconomic History   Marital status: Single     Spouse name: Not on file   Number of children: 0   Years of education: 12   Highest education level: Not on file  Occupational History   Occupation: Chartered certified accountant  Tobacco Use   Smoking status: Former    Packs/day: 1.00    Years: 37.00    Pack years: 37.00    Types: Cigarettes    Quit date: 04/20/2020    Years since quitting: 0.9   Smokeless tobacco: Never  Vaping Use   Vaping Use: Never used  Substance and Sexual Activity   Alcohol use: Yes    Comment: occational   Drug use: No    Comment: hx of marijuana and cocaine, LSD   Sexual activity: Yes    Partners: Female    Birth control/protection: None  Other Topics Concern   Not on file  Social History Narrative   Not on file   Social Determinants of Health   Financial Resource Strain: Not on file  Food Insecurity: Not on file  Transportation Needs: Not on file  Physical Activity: Not on file  Stress: Not on file  Social Connections: Not on file  Intimate Partner Violence: Not on file    Family History  Problem Relation Age of Onset   CAD Mother    Deep vein thrombosis Brother     Past Surgical History:  Procedure Laterality Date   COLONOSCOPY     left knee arthroscopy      left rotator cuff surgery      REVISION TOTAL KNEE ARTHROPLASTY Left 06/15/2018   SHOULDER OPEN ROTATOR CUFF REPAIR Right 01/29/2013   Procedure: RIGHT SHOULDER MINI ROTATOR CUFF REPAIR;  Surgeon: Javier Docker, MD;  Location: [facility];  Service: Orthopedics;  Laterality: Right;  With ANCHORS   TOTAL HIP ARTHROPLASTY Left 06/08/2020   Procedure: TOTAL HIP ARTHROPLASTY ANTERIOR APPROACH;  Surgeon: Samson Frederic, MD;  Location: [facility];  Service: Orthopedics;  Laterality: Left;   TOTAL KNEE ARTHROPLASTY Left 02/12/2017   TOTAL KNEE ARTHROPLASTY Left 02/12/2017   Procedure: LEFT TOTAL KNEE ARTHROPLASTY;  Surgeon: Tarry Kos, MD;  Location: [facility];  Service: Orthopedics;  Laterality: Left;   TOTAL KNEE REVISION Left 06/15/2018   Procedure: LEFT  TOTAL KNEE REVISION;  Surgeon: Tarry Kos, MD;  Location: [facility];  Service: Orthopedics;  Laterality: Left;   TOTAL KNEE REVISION Left 03/08/2019   Procedure: LEFT TOTAL KNEE REVISION;  Surgeon: Tarry Kos, MD;  Location: [facility];  Service: Orthopedics;  Laterality: Left;   TOTAL KNEE REVISION Left 09/27/2020   Procedure: Left total knee arthroplasty revision;  Surgeon: Samson Frederic, MD;  Location: [facility];  Service: Orthopedics;  Laterality: Left;    ROS: Review of Systems Negative except as stated above  PHYSICAL EXAM: BP 132/82   Pulse 82   Resp 16   Wt 249 lb (112.9 kg)   SpO2 97%   BMI 34.73 kg/m   Wt Readings from Last 3 Encounters:  03/16/21 249 lb (112.9 kg)  09/27/20 264 lb 15.9 oz (120.2 kg)  09/22/20 265 lb (120.2 kg)    Physical Exam   General appearance - alert, well appearing, and in no distress Mental status - normal mood, behavior, speech, dress, motor activity, and thought processes Neck - supple, no significant adenopathy Chest - clear to auscultation, no wheezes, rales or rhonchi, symmetric air entry Heart - normal rate, regular rhythm, normal S1, S2, no murmurs, rubs, clicks or gallops Extremities - peripheral pulses normal, no pedal edema, no clubbing or cyanosis  CMP Latest Ref Rng & Units 09/28/2020 09/22/2020 06/01/2020  Glucose 70 - 99 mg/dL 176(H) 90 75  BUN 6 - 20 mg/dL 18 17 60(V)  Creatinine 0.61 - 1.24 mg/dL 3.71 0.62 6.94  Sodium 135 - 145 mmol/L 138 141 138  Potassium 3.5 - 5.1 mmol/L 4.6 3.8 4.5  Chloride 98 - 111 mmol/L 104 104 100  CO2 22 - 32 mmol/L 25 25 28   Calcium 8.9 - 10.3 mg/dL 9.0 9.4 9.6  Total Protein 6.5 - 8.1 g/dL - 7.2 7.9  Total Bilirubin 0.3 - 1.2 mg/dL - ) 0.6  Alkaline Phos 38 - 126 U/L - 77 76  AST 15 - 41 U/L - 21 23  ALT 0 - 44 U/L - 22 22   Lipid Panel     Component Value Date/Time   CHOL 197 03/30/2020 1456   TRIG 272 (H) 03/30/2020 1456   HDL 52 03/30/2020 1456   CHOLHDL 3.8 03/30/2020 1456   LDLCALC  99 03/30/2020 1456    CBC    Component Value Date/Time   WBC 8.5 11/01/2020 0930   WBC 11.5 (H) 09/28/2020 0318   RBC 4.24 11/01/2020 0930   RBC 3.85 (L) 09/28/2020 0318   HGB 11.6 (L) 11/01/2020 0930   HCT 36.0 (L) 11/01/2020 0930   PLT 404 11/01/2020 0930   MCV 85 11/01/2020 0930   MCH 27.4 11/01/2020 0930   MCH 29.4 09/28/2020 0318   MCHC 32.2 11/01/2020 0930   MCHC 32.9 09/28/2020 0318   RDW 13.9 11/01/2020 0930   LYMPHSABS 1.7 03/02/2019 0927   LYMPHSABS 1.4 05/12/2018 0853   MONOABS 0.7 03/02/2019 0927   EOSABS 0.2 03/02/2019 0927   EOSABS 0.2 05/12/2018 0853   BASOSABS 0.1 03/02/2019 0927   BASOSABS 0.0 05/12/2018 0853    ASSESSMENT AND PLAN: 1. Essential hypertension Close to goal.  Continue current medications and low-salt diet.  2. Obesity (BMI 30.0-34.9) Commended him on weight loss.  Encouraged him to remain as active as he can and continue current healthy eating habits.  3. Former smoker Encouraged him to remain tobacco free.  4. Need for immunization against influenza - Flu Vaccine QUAD 59mo+IM (Fluarix, Fluzone & Alfiuria Quad PF)    Patient was given the opportunity to ask questions.  Patient verbalized understanding of the plan and was able to repeat key elements of the plan.   Orders Placed This Encounter  Procedures   Flu Vaccine QUAD 81mo+IM (Fluarix, Fluzone & Alfiuria Quad PF)     Requested Prescriptions    No prescriptions requested or ordered in this encounter    Return in about 4 months (around 07/17/2021) for Give appt with [facility] in 2 wks for shingles vaccine.  [facility]  Wynetta Emery, MD, Rosalita Chessman

## 2021-03-16 NOTE — Patient Instructions (Signed)

## 2021-03-30 ENCOUNTER — Ambulatory Visit: Payer: 59 | Attending: Internal Medicine | Admitting: Pharmacist

## 2021-03-30 ENCOUNTER — Other Ambulatory Visit: Payer: Self-pay

## 2021-03-30 DIAGNOSIS — Z23 Encounter for immunization: Secondary | ICD-10-CM | POA: Diagnosis not present

## 2021-03-30 NOTE — Progress Notes (Signed)
Patient presents for vaccination against zoster per orders of Dr. Johnson. Consent given. Counseling provided. No contraindications exists. Vaccine administered without incident.   Luke Van Ausdall, PharmD, BCACP, CPP Clinical Pharmacist [facility] [phone]  

## 2021-04-02 ENCOUNTER — Other Ambulatory Visit: Payer: Self-pay | Admitting: Internal Medicine

## 2021-04-02 DIAGNOSIS — I10 Essential (primary) hypertension: Secondary | ICD-10-CM

## 2021-04-02 NOTE — Telephone Encounter (Signed)
Requested medication (s) are due for refill today: Yes  Requested medication (s) are on the active medication list: Yes  Last refill:    Future visit scheduled: Yes  Notes to clinic:  Protocol indicates pt. Needs lab work.    Requested Prescriptions  Pending Prescriptions Disp Refills   lisinopril-hydrochlorothiazide (ZESTORETIC) 20-25 MG tablet [Pharmacy Med Name: LISINOPRIL-HCTZ 20-25 MG TAB] 90 tablet 0    Sig: TAKE 1 TABLET BY MOUTH EVERY DAY     Cardiovascular:  ACEI + Diuretic Combos Failed - 04/02/2021  1:42 AM      Failed - Na in normal range and within 180 days    Sodium  Date Value Ref Range Status  09/28/2020 138 135 - 145 mmol/L Final  03/30/2020 137 134 - 144 mmol/L Final          Failed - K in normal range and within 180 days    Potassium  Date Value Ref Range Status  09/28/2020 4.6 3.5 - 5.1 mmol/L Final          Failed - Cr in normal range and within 180 days    Creatinine, Ser  Date Value Ref Range Status  09/28/2020 0.78 0.61 - 1.24 mg/dL Final          Failed - Ca in normal range and within 180 days    Calcium  Date Value Ref Range Status  09/28/2020 9.0 8.9 - 10.3 mg/dL Final          Passed - Patient is not pregnant      Passed - Last BP in normal range    BP Readings from Last 1 Encounters:  03/16/21 132/82          Passed - Valid encounter within last 6 months    Recent Outpatient Visits           2 weeks ago Essential hypertension   [facility] Marcine Matar, MD   5 months ago Hospital discharge follow-up   [facility] Marcine Matar, MD   6 months ago Encounter for preoperative assessment   [facility] Marcine Matar, MD   7 months ago Essential hypertension   [facility] [place], Cornelius Moras, RPH-CPP   8 months ago Essential hypertension   [facility] Marcine Matar, MD       Future Appointments             In 3 months Laural Benes, Binnie Rail, MD [facility]

## 2021-04-24 ENCOUNTER — Ambulatory Visit: Payer: 59 | Admitting: Internal Medicine

## 2021-04-30 ENCOUNTER — Other Ambulatory Visit: Payer: Self-pay | Admitting: Internal Medicine

## 2021-04-30 DIAGNOSIS — E785 Hyperlipidemia, unspecified: Secondary | ICD-10-CM

## 2021-04-30 NOTE — Telephone Encounter (Signed)
Requested medications are due for refill today.  yes  Requested medications are on the active medications list.  yes  Last refill. 02/15/2021  Future visit scheduled.   yes  Notes to clinic.  Labs are expired.

## 2021-05-13 ENCOUNTER — Other Ambulatory Visit: Payer: Self-pay | Admitting: Internal Medicine

## 2021-05-13 DIAGNOSIS — I10 Essential (primary) hypertension: Secondary | ICD-10-CM

## 2021-05-14 NOTE — Telephone Encounter (Signed)
Requested Prescriptions  Pending Prescriptions Disp Refills   amLODipine (NORVASC) 10 MG tablet [Pharmacy Med Name: AMLODIPINE BESYLATE 10 MG TAB] 90 tablet 0    Sig: TAKE 1 TABLET BY MOUTH EVERY DAY     Cardiovascular:  Calcium Channel Blockers Passed - 05/13/2021  5:43 PM      Passed - Last BP in normal range    BP Readings from Last 1 Encounters:  03/16/21 132/82         Passed - Valid encounter within last 6 months    Recent Outpatient Visits          1 month ago Essential hypertension   [place] [facility] Marcine Matar, MD   6 months ago Hospital discharge follow-up   [facility] Marcine Matar, MD   8 months ago Encounter for preoperative assessment   [facility] Marcine Matar, MD   9 months ago Essential hypertension   [facility] [place], Cornelius Moras, RPH-CPP   9 months ago Essential hypertension   [place] [facility] Marcine Matar, MD      Future Appointments            In 2 months Laural Benes Binnie Rail, MD [facility]

## 2021-06-08 ENCOUNTER — Other Ambulatory Visit: Payer: Self-pay | Admitting: Internal Medicine

## 2021-06-08 DIAGNOSIS — I10 Essential (primary) hypertension: Secondary | ICD-10-CM

## 2021-06-08 NOTE — Telephone Encounter (Signed)
Requested Prescriptions  Pending Prescriptions Disp Refills   hydrALAZINE (APRESOLINE) 10 MG tablet [Pharmacy Med Name: HYDRALAZINE 10 MG TABLET] 180 tablet 0    Sig: TAKE 1 TABLET BY MOUTH TWICE A DAY     Cardiovascular:  Vasodilators Failed - 06/08/2021  1:39 AM      Failed - HCT in normal range and within 360 days    Hematocrit  Date Value Ref Range Status  11/01/2020 36.0 (L) 37.5 - 51.0 % Final         Failed - HGB in normal range and within 360 days    Hemoglobin  Date Value Ref Range Status  11/01/2020 11.6 (L) 13.0 - 17.7 g/dL Final         Passed - RBC in normal range and within 360 days    RBC  Date Value Ref Range Status  11/01/2020 4.24 4.14 - 5.80 x10E6/uL Final  09/28/2020 3.85 (L) 4.22 - 5.81 MIL/uL Final         Passed - WBC in normal range and within 360 days    WBC  Date Value Ref Range Status  11/01/2020 8.5 3.4 - 10.8 x10E3/uL Final  09/28/2020 11.5 (H) 4.0 - 10.5 K/uL Final         Passed - PLT in normal range and within 360 days    Platelets  Date Value Ref Range Status  11/01/2020 404 150 - 450 x10E3/uL Final         Passed - Last BP in normal range    BP Readings from Last 1 Encounters:  03/16/21 132/82         Passed - Valid encounter within last 12 months    Recent Outpatient Visits          2 months ago Essential hypertension   [place], Deborah B, MD   7 months ago Hospital discharge follow-up   [place], MD   9 months ago Encounter for preoperative assessment   [place], Deborah B, MD   9 months ago Essential hypertension   [place], Jarome Matin, RPH-CPP   10 months ago Essential hypertension   [place], Deborah B, MD      Future Appointments            In 1 month Wynetta Emery, Dalbert Batman, MD [place]

## 2021-06-28 ENCOUNTER — Other Ambulatory Visit: Payer: Self-pay | Admitting: Internal Medicine

## 2021-06-28 DIAGNOSIS — I10 Essential (primary) hypertension: Secondary | ICD-10-CM

## 2021-06-28 NOTE — Telephone Encounter (Signed)
Requested medication (s) are due for refill today: yes   Requested medication (s) are on the active medication list: yes  Last refill:  04/02/21 #90 0 refills  Future visit scheduled: yes in 3 weeks  Notes to clinic:  overdue last per protocol last labs 09/28/20. Do you want to refill Rx?     Requested Prescriptions  Pending Prescriptions Disp Refills   lisinopril-hydrochlorothiazide (ZESTORETIC) 20-25 MG tablet [Pharmacy Med Name: LISINOPRIL-HCTZ 20-25 MG TAB] 90 tablet 0    Sig: TAKE 1 TABLET BY MOUTH EVERY DAY     Cardiovascular:  ACEI + Diuretic Combos Failed - 06/28/2021  2:22 AM      Failed - Na in normal range and within 180 days    Sodium  Date Value Ref Range Status  09/28/2020 138 135 - 145 mmol/L Final  03/30/2020 137 134 - 144 mmol/L Final          Failed - K in normal range and within 180 days    Potassium  Date Value Ref Range Status  09/28/2020 4.6 3.5 - 5.1 mmol/L Final          Failed - Cr in normal range and within 180 days    Creatinine, Ser  Date Value Ref Range Status  09/28/2020 0.78 0.61 - 1.24 mg/dL Final          Failed - eGFR is 30 or above and within 180 days    GFR calc Af Amer  Date Value Ref Range Status  03/30/2020 114 >59 mL/min/1.73 Final    Comment:    **In accordance with recommendations from the NKF-ASN Task force,**   [facility] is in the process of updating its eGFR calculation to the   2021 CKD-EPI creatinine equation that estimates kidney function   without a race variable.    GFR, Estimated  Date Value Ref Range Status  09/28/2020 >60 >60 mL/min Final    Comment:    (NOTE) Calculated using the CKD-EPI Creatinine Equation (2021)           Passed - Patient is not pregnant      Passed - Last BP in normal range    BP Readings from Last 1 Encounters:  03/16/21 132/82          Passed - Valid encounter within last 6 months    Recent Outpatient Visits           3 months ago Essential hypertension   [place], MD   8 months ago Hospital discharge follow-up   [place], MD   9 months ago Encounter for preoperative assessment   [place], Deborah B, MD   10 months ago Essential hypertension   [place], RPH-CPP   10 months ago Essential hypertension   [place], MD       Future Appointments             In 3 weeks Ladell Pier, MD [place]

## 2021-07-19 ENCOUNTER — Ambulatory Visit: Payer: 59 | Attending: Internal Medicine | Admitting: Internal Medicine

## 2021-07-19 ENCOUNTER — Other Ambulatory Visit: Payer: Self-pay

## 2021-07-19 DIAGNOSIS — I10 Essential (primary) hypertension: Secondary | ICD-10-CM

## 2021-07-19 DIAGNOSIS — F17210 Nicotine dependence, cigarettes, uncomplicated: Secondary | ICD-10-CM

## 2021-07-19 DIAGNOSIS — Z23 Encounter for immunization: Secondary | ICD-10-CM

## 2021-07-19 DIAGNOSIS — F17200 Nicotine dependence, unspecified, uncomplicated: Secondary | ICD-10-CM

## 2021-07-19 DIAGNOSIS — E785 Hyperlipidemia, unspecified: Secondary | ICD-10-CM | POA: Diagnosis not present

## 2021-07-19 DIAGNOSIS — E669 Obesity, unspecified: Secondary | ICD-10-CM | POA: Diagnosis not present

## 2021-07-19 MED ORDER — LISINOPRIL-HYDROCHLOROTHIAZIDE 20-25 MG PO TABS: 1.0000 | ORAL_TABLET | Freq: Every day | ORAL | 2 refills | Status: DC

## 2021-07-19 MED ORDER — VARENICLINE TARTRATE 0.5 MG PO TABS: ORAL_TABLET | ORAL | 0 refills | Status: DC

## 2021-07-19 NOTE — Progress Notes (Signed)
Virtual Visit via Video Note  I connected with Michael [name] on 07/19/2021 at 8:32 AM by a video enabled telemedicine application and verified that I am speaking with the correct person using two identifiers.  Location: Patient: in the office Provider: working from home   I discussed the limitations of evaluation and management by telemedicine and the availability of in person appointments. The patient expressed understanding and agreed to proceed.  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  Patient was last seen 02/2021.  Purpose of today's visit is chronic disease management.  Patient tells me he will have Medicare as of March 1.  HTN: Patient reports doing well on amlodipine, hydralazine and lisinopril/HCTZ.  Limit salt in the foods.  Denies chest pains or shortness of breath. Checks blood pressure almost daily.  Some of his recent numbers were 120/78, 130/79.  The highest was 145/98.  Obesity/prediabetes: The last time I saw him he was down 15 pounds since May of last year.  He thinks he has loss a little bit more but has not weighed himself lately.  Feels he is doing much better with his eating habits.  He tries to walk as much as he can.  Some days he still has to use his cane but most days not.  Tobacco dependence: States that he smoked a few cigarettes over Christmas when he had some drinks at a party.  He has not touched a cigarette since then.  He finds the Chantix helpful but has not taken consistently.  He requests refill on the Chantix with the starter dose.  HL: He is taking and tolerating atorvastatin.  Outpatient Encounter Medications as of 07/19/2021  Medication Sig   amLODipine (NORVASC) 10 MG tablet TAKE 1 TABLET BY MOUTH EVERY DAY   atorvastatin (LIPITOR) 20 MG tablet TAKE 1 TABLET BY MOUTH EVERY DAY   docusate  sodium (COLACE) 100 MG capsule Take 1 capsule (100 mg total) by mouth 2 (two) times daily.   hydrALAZINE (APRESOLINE) 10 MG tablet TAKE 1 TABLET BY MOUTH TWICE A DAY   HYDROcodone-acetaminophen (NORCO) 7.5-325 MG tablet Take 1-2 tablets by mouth every 4 (four) hours as needed for severe pain (pain score 7-10). (Patient not taking: Reported on 03/16/2021)   lisinopril-hydrochlorothiazide (ZESTORETIC) 20-25 MG tablet TAKE 1 TABLET BY MOUTH EVERY DAY   methadone (DOLOPHINE) 10 MG tablet Take 3 tablets (30 mg total) by mouth every 8 (eight) hours. (Patient taking differently: Take 60 mg by mouth daily.)   ondansetron (ZOFRAN) 4 MG tablet Take 1 tablet (4 mg total) by mouth every 6 (six) hours as needed for nausea.   psyllium (METAMUCIL) 58.6 % packet Take 1 packet by mouth daily.   senna (SENOKOT) 8.6 MG TABS tablet Take 1 tablet (8.6 mg total) by mouth 2 (two) times daily.   tetrahydrozoline 0.05 % ophthalmic solution Place 1 drop into both eyes 2 (two) times daily as needed (dry eyes).   varenicline (CHANTIX) 0.5 MG tablet 1 tab PO daily x 3 days then increase to 1 tab BID x 4 days (Patient not taking: No sig reported)   varenicline (CHANTIX) 1 MG tablet TAKE 1 TABLET (1 MG TOTAL) BY MOUTH 2 (TWO) TIMES DAILY. START ONCE 0.5 MG DOSE COMPLETED (Patient not taking: Reported on 03/16/2021)   [DISCONTINUED] carvedilol (COREG) 6.25 MG tablet Take 1  tablet (6.25 mg total) by mouth 2 (two) times daily with a meal.   No facility-administered encounter medications on file as of 07/19/2021.      Observations/Objective: Patient sitting on exam table.  He appears in no acute distress.    Chemistry      Component Value Date/Time   NA 138 09/28/2020 0318   NA 137 03/30/2020 1456   K 4.6 09/28/2020 0318   CL 104 09/28/2020 0318   CO2 25 09/28/2020 0318   BUN 18 09/28/2020 0318   BUN 16 03/30/2020 1456   CREATININE 0.78 09/28/2020 0318      Component Value Date/Time   CALCIUM 9.0 09/28/2020 0318    ALKPHOS 77 09/22/2020 0849   AST 21 09/22/2020 0849   ALT 22 09/22/2020 0849   BILITOT 0.2 (L) 09/22/2020 0849   BILITOT 0.3 03/30/2020 1456     Lab Results  Component Value Date   WBC 8.5 11/01/2020   HGB 11.6 (L) 11/01/2020   HCT 36.0 (L) 11/01/2020   MCV 85 11/01/2020   PLT 404 11/01/2020   Lab Results  Component Value Date   CHOL 197 03/30/2020   HDL 52 03/30/2020   LDLCALC 99 03/30/2020   TRIG 272 (H) 03/30/2020   CHOLHDL 3.8 03/30/2020     Assessment and Plan: 1. Essential hypertension Reported home blood pressure readings are at goal.  Continue lisinopril/HCTZ, Norvasc and hydralazine - lisinopril-hydrochlorothiazide (ZESTORETIC) 20-25 MG tablet; Take 1 tablet by mouth daily.  Dispense: 90 tablet; Refill: 2  2. Obesity (BMI 30.0-34.9) Commended him on trying to improve his eating habits.  Encouraged him to move as much as he can.  3. Hyperlipidemia, unspecified hyperlipidemia type Continue atorvastatin.  Last LDL was at goal.  4. Tobacco dependence Continue to encourage him to quit.  Refill given on Chantix starter pack. - varenicline (CHANTIX) 0.5 MG tablet; 1 tab PO daily x 3 days then increase to 1 tab BID x 4 days  Dispense: 11 tablet; Refill: 0  5. Need for shingles vaccine Patient given Shingrix No. 2 today.   Follow Up Instructions: 4 mths for Medicare Wellness   I discussed the assessment and treatment plan with the patient. The patient was provided an opportunity to ask questions and all were answered. The patient agreed with the plan and demonstrated an understanding of the instructions.   The patient was advised to call back or seek an in-person evaluation if the symptoms worsen or if the condition fails to improve as anticipated.  I spent 9 minutes dedicated to the care of this patient on the date of this encounter to include previsit review of of my last note, face-to-face time with the patient and post visit ordering of tests.  This note has  been created with Education officer, environmental. Any transcriptional errors are unintentional.  Jonah Blue, MD

## 2021-08-02 ENCOUNTER — Other Ambulatory Visit: Payer: Self-pay | Admitting: Internal Medicine

## 2021-08-02 DIAGNOSIS — E785 Hyperlipidemia, unspecified: Secondary | ICD-10-CM

## 2021-08-02 NOTE — Telephone Encounter (Signed)
Requested medication (s) are due for refill today: yes ? ?Requested medication (s) are on the active medication list: yes ? ?Last refill:  05/01/21 #90 with 0 RF ? ?Future visit scheduled: 11/23/2021, was seen 07/19/21. ? ?Notes to clinic:  Failed protocol of labs within 12 months, (lipids 03/30/2020) has upcoming appt in June, please assess. ? ? ? ?  ? ?Requested Prescriptions  ?Pending Prescriptions Disp Refills  ? atorvastatin (LIPITOR) 20 MG tablet [Pharmacy Med Name: ATORVASTATIN 20 MG TABLET] 90 tablet 0  ?  Sig: TAKE 1 TABLET BY MOUTH EVERY DAY  ?  ? Cardiovascular:  Antilipid - Statins Failed - 08/02/2021 10:10 AM  ?  ?  Failed - Lipid Panel in normal range within the last 12 months  ?  Cholesterol, Total  ?Date Value Ref Range Status  ?03/30/2020 197 100 - 199 mg/dL Final  ? ?LDL Chol Calc (NIH)  ?Date Value Ref Range Status  ?03/30/2020 99 0 - 99 mg/dL Final  ? ?HDL  ?Date Value Ref Range Status  ?03/30/2020 52 >39 mg/dL Final  ? ?Triglycerides  ?Date Value Ref Range Status  ?03/30/2020 272 (H) 0 - 149 mg/dL Final  ? ?  ?  ?  Passed - Patient is not pregnant  ?  ?  Passed - Valid encounter within last 12 months  ?  Recent Outpatient Visits   ? ?      ? 2 weeks ago Essential hypertension  ? [place] Ladell Pier, MD  ? 4 months ago Essential hypertension  ? [place] Ladell Pier, MD  ? 9 months ago Hospital discharge follow-up  ? [place] Ladell Pier, MD  ? 10 months ago Encounter for preoperative assessment  ? [place] Ladell Pier, MD  ? 11 months ago Essential hypertension  ? [place], RPH-CPP  ? ?  ?  ?Future Appointments   ? ?        ? In 3 months Ladell Pier, MD [place]  ? ?  ? ?  ?  ?  ? ? ?

## 2021-08-07 ENCOUNTER — Other Ambulatory Visit: Payer: Self-pay | Admitting: Internal Medicine

## 2021-08-07 DIAGNOSIS — I10 Essential (primary) hypertension: Secondary | ICD-10-CM

## 2021-08-07 NOTE — Telephone Encounter (Signed)
Requested Prescriptions  ?Pending Prescriptions Disp Refills  ?? amLODipine (NORVASC) 5 MG tablet [Pharmacy Med Name: AMLODIPINE BESYLATE 5 MG TAB] 180 tablet 0  ?  Sig: TAKE 2 TABLETS DAILY (10MG  NOT AVAILABLE)  ?  ? Cardiovascular: Calcium Channel Blockers 2 Passed - 08/07/2021  8:32 AM  ?  ?  Passed - Last BP in normal range  ?  BP Readings from Last 1 Encounters:  ?03/16/21 132/82  ?   ?  ?  Passed - Last Heart Rate in normal range  ?  Pulse Readings from Last 1 Encounters:  ?03/16/21 82  ?   ?  ?  Passed - Valid encounter within last 6 months  ?  Recent Outpatient Visits   ?      ? 2 weeks ago Essential hypertension  ? [place] Ladell Pier, MD  ? 4 months ago Essential hypertension  ? [place] Ladell Pier, MD  ? 9 months ago Hospital discharge follow-up  ? [place] Ladell Pier, MD  ? 11 months ago Encounter for preoperative assessment  ? [place] Ladell Pier, MD  ? 11 months ago Essential hypertension  ? [place], RPH-CPP  ?  ?  ?Future Appointments   ?        ? In 3 months Ladell Pier, MD [place]  ?  ? ?  ?  ?  ? ? ?

## 2021-09-07 ENCOUNTER — Other Ambulatory Visit: Payer: Self-pay | Admitting: Internal Medicine

## 2021-09-07 DIAGNOSIS — I10 Essential (primary) hypertension: Secondary | ICD-10-CM

## 2021-09-07 NOTE — Telephone Encounter (Signed)
Requested Prescriptions  ?Pending Prescriptions Disp Refills  ?? hydrALAZINE (APRESOLINE) 10 MG tablet [Pharmacy Med Name: HYDRALAZINE 10 MG TABLET] 180 tablet 0  ?  Sig: TAKE 1 TABLET BY MOUTH TWICE A DAY  ?  ? Cardiovascular:  Vasodilators Failed - 09/07/2021  2:46 AM  ?  ?  Failed - HCT in normal range and within 360 days  ?  Hematocrit  ?Date Value Ref Range Status  ?11/01/2020 36.0 (L) 37.5 - 51.0 % Final  ?   ?  ?  Failed - HGB in normal range and within 360 days  ?  Hemoglobin  ?Date Value Ref Range Status  ?11/01/2020 11.6 (L) 13.0 - 17.7 g/dL Final  ?   ?  ?  Failed - ANA Screen, Ifa, Serum in normal range and within 360 days  ?  No results found for: ANA, ANATITER, LABANTI   ?  ?  Passed - RBC in normal range and within 360 days  ?  RBC  ?Date Value Ref Range Status  ?11/01/2020 4.24 4.14 - 5.80 x10E6/uL Final  ?09/28/2020 3.85 (L) 4.22 - 5.81 MIL/uL Final  ?   ?  ?  Passed - WBC in normal range and within 360 days  ?  WBC  ?Date Value Ref Range Status  ?11/01/2020 8.5 3.4 - 10.8 x10E3/uL Final  ?09/28/2020 11.5 (H) 4.0 - 10.5 K/uL Final  ?   ?  ?  Passed - PLT in normal range and within 360 days  ?  Platelets  ?Date Value Ref Range Status  ?11/01/2020 404 150 - 450 x10E3/uL Final  ?   ?  ?  Passed - Last BP in normal range  ?  BP Readings from Last 1 Encounters:  ?03/16/21 132/82  ?   ?  ?  Passed - Valid encounter within last 12 months  ?  Recent Outpatient Visits   ?      ? 1 month ago Essential hypertension  ? [place] Ladell Pier, MD  ? 5 months ago Essential hypertension  ? [place] Ladell Pier, MD  ? 10 months ago Hospital discharge follow-up  ? [place] Ladell Pier, MD  ? 1 year ago Encounter for preoperative assessment  ? [place] Ladell Pier, MD  ? 1 year ago Essential hypertension  ? [place], RPH-CPP  ?  ?  ?Future Appointments   ?        ? In 2 months Ladell Pier, MD [place]  ?  ? ?  ?  ?  ? ?

## 2021-10-17 DIAGNOSIS — Z79899 Other long term (current) drug therapy: Secondary | ICD-10-CM | POA: Diagnosis not present

## 2021-10-17 DIAGNOSIS — I10 Essential (primary) hypertension: Secondary | ICD-10-CM | POA: Diagnosis not present

## 2021-10-17 DIAGNOSIS — G8929 Other chronic pain: Secondary | ICD-10-CM | POA: Diagnosis not present

## 2021-10-17 DIAGNOSIS — R030 Elevated blood-pressure reading, without diagnosis of hypertension: Secondary | ICD-10-CM | POA: Diagnosis not present

## 2021-10-19 DIAGNOSIS — Z79899 Other long term (current) drug therapy: Secondary | ICD-10-CM | POA: Diagnosis not present

## 2021-11-02 ENCOUNTER — Other Ambulatory Visit: Payer: Self-pay | Admitting: Internal Medicine

## 2021-11-02 DIAGNOSIS — I10 Essential (primary) hypertension: Secondary | ICD-10-CM

## 2021-11-02 NOTE — Telephone Encounter (Signed)
Requested Prescriptions  Pending Prescriptions Disp Refills  . amLODipine (NORVASC) 5 MG tablet [Pharmacy Med Name: AMLODIPINE BESYLATE 5 MG TAB] 180 tablet 0    Sig: TAKE 2 TABLETS DAILY (10MG  NOT AVAILABLE)     Cardiovascular: Calcium Channel Blockers 2 Passed - 11/02/2021  2:26 AM      Passed - Last BP in normal range    BP Readings from Last 1 Encounters:  03/16/21 132/82         Passed - Last Heart Rate in normal range    Pulse Readings from Last 1 Encounters:  03/16/21 82         Passed - Valid encounter within last 6 months    Recent Outpatient Visits          3 months ago Essential hypertension   [place], MD   7 months ago Essential hypertension   [place], Deborah B, MD   1 year ago Hospital discharge follow-up   [place], MD   1 year ago Encounter for preoperative assessment   [place], Deborah B, MD   1 year ago Essential hypertension   [place], RPH-CPP      Future Appointments            In 3 weeks Ladell Pier, MD [place]

## 2021-11-09 ENCOUNTER — Other Ambulatory Visit: Payer: Self-pay | Admitting: Internal Medicine

## 2021-11-09 DIAGNOSIS — E785 Hyperlipidemia, unspecified: Secondary | ICD-10-CM

## 2021-11-23 ENCOUNTER — Ambulatory Visit: Payer: PPO | Attending: Internal Medicine | Admitting: Internal Medicine

## 2021-11-23 ENCOUNTER — Encounter: Payer: Self-pay | Admitting: Internal Medicine

## 2021-11-23 VITALS — BP 130/70 | HR 91 | Wt 255.6 lb

## 2021-11-23 DIAGNOSIS — E669 Obesity, unspecified: Secondary | ICD-10-CM | POA: Diagnosis not present

## 2021-11-23 DIAGNOSIS — F1191 Opioid use, unspecified, in remission: Secondary | ICD-10-CM

## 2021-11-23 DIAGNOSIS — Z0000 Encounter for general adult medical examination without abnormal findings: Secondary | ICD-10-CM | POA: Diagnosis not present

## 2021-11-23 DIAGNOSIS — I10 Essential (primary) hypertension: Secondary | ICD-10-CM

## 2021-11-23 NOTE — Progress Notes (Unsigned)
Subjective:   Michael [name] is a 61 y.o. male who presents for a Welcome to Medicare exam.  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   Review of Systems: [facility]:  took meds already today. Checks BP daily.  Range 120/78, 130/83, 158/87  Feels BP elev today because in pain from RT knee.  Thinks he has torn meniscus MSK:  had x-ray and given cortisone shot in Feb.  Started flaring last wk again.       Objective:    Today's Vitals   11/23/21 1026  BP: (!) 153/86  Pulse: 91  SpO2: 90%  Weight: 255 lb 9.6 oz (115.9 kg)  PainSc: 7    Body mass index is 35.65 kg/m. Wt Readings from Last 3 Encounters:  11/23/21 255 lb 9.6 oz (115.9 kg)  03/16/21 249 lb (112.9 kg)  09/27/20 264 lb 15.9 oz (120.2 kg)   opioid Medications Outpatient Encounter Medications as of 11/23/2021  Medication Sig   amLODipine (NORVASC) 5 MG tablet TAKE 2 TABLETS DAILY (10MG  NOT AVAILABLE)   atorvastatin (LIPITOR) 20 MG tablet TAKE 1 TABLET BY MOUTH EVERY DAY   lisinopril-hydrochlorothiazide (ZESTORETIC) 20-25 MG tablet Take 1 tablet by mouth daily.   methadone (DOLOPHINE) 10 MG tablet Take 3 tablets (30 mg total) by mouth every 8 (eight) hours. (Patient taking differently: Take 60 mg by mouth daily.)   docusate sodium (COLACE) 100 MG capsule Take 1 capsule (100 mg total) by mouth 2 (two) times daily.   hydrALAZINE (APRESOLINE) 10 MG tablet TAKE 1 TABLET BY MOUTH TWICE A DAY   HYDROcodone-acetaminophen (NORCO) 7.5-325 MG tablet Take 1-2 tablets by mouth every 4 (four) hours as needed for severe pain (pain score 7-10). (Patient not taking: Reported on 03/16/2021)   ondansetron (ZOFRAN) 4 MG tablet Take 1 tablet (4 mg total) by mouth every 6 (six) hours as needed for nausea.   psyllium (METAMUCIL) 58.6 % packet Take 1 packet by mouth daily.   senna (SENOKOT) 8.6 MG TABS tablet Take 1  tablet (8.6 mg total) by mouth 2 (two) times daily.   tetrahydrozoline 0.05 % ophthalmic solution Place 1 drop into both eyes 2 (two) times daily as needed (dry eyes).   varenicline (CHANTIX) 0.5 MG tablet 1 tab PO daily x 3 days then increase to 1 tab BID x 4 days   varenicline (CHANTIX) 1 MG tablet TAKE 1 TABLET (1 MG TOTAL) BY MOUTH 2 (TWO) TIMES DAILY. START ONCE 0.5 MG DOSE COMPLETED (Patient not taking: Reported on 03/16/2021)   [DISCONTINUED] carvedilol (COREG) 6.25 MG tablet Take 1 tablet (6.25 mg total) by mouth 2 (two) times daily with a meal.   No facility-administered encounter medications on file as of 11/23/2021.     History: Past Medical History:  Diagnosis Date   Arthritis    Hepatitis    Hep C rx took treatment.   Hypertension    Pre-diabetes    Past Surgical History:  Procedure Laterality Date   COLONOSCOPY     left knee arthroscopy      left rotator cuff surgery      REVISION TOTAL KNEE ARTHROPLASTY Left 06/15/2018   SHOULDER OPEN ROTATOR CUFF REPAIR Right 01/29/2013   Procedure: RIGHT SHOULDER MINI ROTATOR CUFF REPAIR;  Surgeon: 03/31/2013, MD;  Location: [facility];  Service: Orthopedics;  Laterality: Right;  With [facility]  TOTAL HIP ARTHROPLASTY Left 06/08/2020   Procedure: TOTAL HIP ARTHROPLASTY ANTERIOR APPROACH;  Surgeon: Samson Frederic, MD;  Location: [facility];  Service: Orthopedics;  Laterality: Left;   TOTAL KNEE ARTHROPLASTY Left 02/12/2017   TOTAL KNEE ARTHROPLASTY Left 02/12/2017   Procedure: LEFT TOTAL KNEE ARTHROPLASTY;  Surgeon: Tarry Kos, MD;  Location: [facility];  Service: Orthopedics;  Laterality: Left;   TOTAL KNEE REVISION Left 06/15/2018   Procedure: LEFT TOTAL KNEE REVISION;  Surgeon: Tarry Kos, MD;  Location: [facility];  Service: Orthopedics;  Laterality: Left;   TOTAL KNEE REVISION Left 03/08/2019   Procedure: LEFT TOTAL KNEE REVISION;  Surgeon: Tarry Kos, MD;  Location: [facility];  Service: Orthopedics;  Laterality: Left;   TOTAL KNEE REVISION  Left 09/27/2020   Procedure: Left total knee arthroplasty revision;  Surgeon: Samson Frederic, MD;  Location: [facility];  Service: Orthopedics;  Laterality: Left;    Family History  Problem Relation Age of Onset   CAD Mother    Deep vein thrombosis Brother    Social History   Occupational History   Occupation: Chartered certified accountant  Tobacco Use   Smoking status: Former    Packs/day: 1.00    Years: 37.00    Total pack years: 37.00    Types: Cigarettes    Quit date: 04/20/2020    Years since quitting: 1.5   Smokeless tobacco: Never   Tobacco comments:    Occasionally will smoke one  Vaping Use   Vaping Use: Never used  Substance and Sexual Activity   Alcohol use: Yes    Comment: occational   Drug use: No    Comment: hx of marijuana and cocaine, LSD   Sexual activity: Yes    Partners: Female    Birth control/protection: None   Tobacco Counseling Smoke about 1 cigarette a mth when he is stress Last drink 2-3 mths ago Still on Methadone 50 mg daily through a treatment program.  Would like to get off of it.    Immunizations and Health Maintenance Immunization History  Administered Date(s) Administered   Influenza,inj,Quad PF,6+ Mos 01/20/2017, 03/02/2018, 05/10/2019, 03/30/2020, 03/16/2021   Influenza-Unspecified 01/20/2017, 03/02/2018, 04/15/2019   PFIZER(Purple Top)SARS-COV-2 Vaccination 08/21/2019, 09/11/2019, 05/12/2020   Pfizer Covid-19 Vaccine Bivalent Booster 8yrs & up 10/27/2020   Tdap 10/18/2016   Zoster Recombinat (Shingrix) 03/30/2021, 07/19/2021   There are no preventive care reminders to display for this patient.  Activities of Daily Living    11/23/2021   10:32 AM  In your present state of health, do you have any difficulty performing the following activities:  Hearing? 1  Vision? 1  Comment has reading glasses  Difficulty concentrating or making decisions? 0  Walking or climbing stairs? 1  Dressing or bathing? 1  Doing errands, shopping? 0  Preparing Food and  eating ? N  Using the Toilet? N  In the past six months, have you accidently leaked urine? N  Do you have problems with loss of bowel control? N  Managing your Medications? N  Managing your Finances? N  Housekeeping or managing your Housekeeping? N    Physical Exam  ***(optional), or other factors deemed appropriate based on the beneficiary's medical and social history and current clinical standards.  Advanced Directives: Does Patient Have a Medical Advance Directive?: No Would patient like information on creating a medical advance directive?: No - Patient declined    Assessment:    This is a routine wellness  examination for this patient . ***  Vision/Hearing  screen Hearing Screening   500Hz  1000Hz  2000Hz  4000Hz   Right ear 20 20 20 20   Left ear 20 20 20 20    Vision Screening   Right eye Left eye Both eyes  Without correction   20/20  With correction       Dietary issues and exercise activities discussed:      Goals   None     Depression Screen    11/23/2021   10:31 AM 03/16/2021    3:35 PM 09/07/2020    2:11 PM 08/03/2020   10:08 AM  PHQ 2/9 Scores  PHQ - 2 Score 0 0 0 0  PHQ- 9 Score   0      Fall Risk    11/23/2021   10:31 AM  Fall Risk   Falls in the past year? 0  Number falls in past yr: 0  Injury with Fall? 0  Risk for fall due to : Other (Comment)    Cognitive Function    11/23/2021   10:47 AM  MMSE - Mini Mental State Exam  Orientation to time 5  Orientation to Place 5  Registration 3  Attention/ Calculation 5  Recall 3  Language- name 2 objects 2  Language- repeat 1  Language- follow 3 step command 3  Language- read & follow direction 1  Write a sentence 1  Copy design 1  Total score 30        Patient Care Team: 03/18/2021, MD as PCP - General (Internal Medicine)     Plan:   ***  I have personally reviewed and noted the following in the patient's chart:   Medical and social history Use of alcohol, tobacco or  illicit drugs  Current medications and supplements Functional ability and status Nutritional status Physical activity Advanced directives List of other physicians Hospitalizations, surgeries, and ER visits in previous 12 months Vitals Screenings to include cognitive, depression, and falls Referrals and appointments  In addition, I have reviewed and discussed with patient certain preventive protocols, quality metrics, and best practice recommendations. A written personalized care plan for preventive services as well as general preventive health recommendations were provided to patient.    09/09/2020, MD 11/23/2021

## 2021-11-23 NOTE — Patient Instructions (Signed)
Continue trying to eat healthy and move as much as you can. You should speak with the methadone clinic provider about slowly tapering you off of methadone as you desire. Please come to lab on future visit for blood test prior to your next office visit with me.

## 2021-12-13 ENCOUNTER — Other Ambulatory Visit: Payer: Self-pay | Admitting: Internal Medicine

## 2021-12-13 DIAGNOSIS — I10 Essential (primary) hypertension: Secondary | ICD-10-CM

## 2021-12-13 NOTE — Telephone Encounter (Signed)
Requested medication (s) are due for refill today: yes  Requested medication (s) are on the active medication list yes  Last refill:  09/07/21  Future visit scheduled: no  Notes to clinic:  Unable to refill per protocol due to failed labs, no updated results.      Requested Prescriptions  Pending Prescriptions Disp Refills   hydrALAZINE (APRESOLINE) 10 MG tablet [Pharmacy Med Name: HYDRALAZINE 10 MG TABLET] 180 tablet 0    Sig: TAKE 1 TABLET BY MOUTH TWICE A DAY     Cardiovascular:  Vasodilators Failed - 12/13/2021  2:26 AM      Failed - HCT in normal range and within 360 days    Hematocrit  Date Value Ref Range Status  11/01/2020 36.0 (L) 37.5 - 51.0 % Final         Failed - HGB in normal range and within 360 days    Hemoglobin  Date Value Ref Range Status  11/01/2020 11.6 (L) 13.0 - 17.7 g/dL Final         Failed - RBC in normal range and within 360 days    RBC  Date Value Ref Range Status  11/01/2020 4.24 4.14 - 5.80 x10E6/uL Final  09/28/2020 3.85 (L) 4.22 - 5.81 MIL/uL Final         Failed - WBC in normal range and within 360 days    WBC  Date Value Ref Range Status  11/01/2020 8.5 3.4 - 10.8 x10E3/uL Final  09/28/2020 11.5 (H) 4.0 - 10.5 K/uL Final         Failed - PLT in normal range and within 360 days    Platelets  Date Value Ref Range Status  11/01/2020 404 150 - 450 x10E3/uL Final         Failed - ANA Screen, Ifa, Serum in normal range and within 360 days    No results found for: "ANA", "ANATITER", "LABANTI"       Passed - Last BP in normal range    BP Readings from Last 1 Encounters:  11/23/21 130/70         Passed - Valid encounter within last 12 months    Recent Outpatient Visits           2 weeks ago Encounter for [employer] annual wellness exam   [facility] Marcine Matar, MD   4 months ago Essential hypertension   [place] [facility] Marcine Matar, MD   9 months ago Essential  hypertension   [place] [facility] Marcine Matar, MD   1 year ago Hospital discharge follow-up   [facility] Marcine Matar, MD   1 year ago Encounter for preoperative assessment   [facility] Marcine Matar, MD

## 2022-01-01 ENCOUNTER — Other Ambulatory Visit: Payer: Self-pay | Admitting: Internal Medicine

## 2022-01-01 DIAGNOSIS — G8929 Other chronic pain: Secondary | ICD-10-CM | POA: Diagnosis not present

## 2022-01-01 DIAGNOSIS — I10 Essential (primary) hypertension: Secondary | ICD-10-CM | POA: Diagnosis not present

## 2022-01-01 DIAGNOSIS — Z87891 Personal history of nicotine dependence: Secondary | ICD-10-CM | POA: Diagnosis not present

## 2022-01-01 DIAGNOSIS — G63 Polyneuropathy in diseases classified elsewhere: Secondary | ICD-10-CM | POA: Diagnosis not present

## 2022-01-01 DIAGNOSIS — E785 Hyperlipidemia, unspecified: Secondary | ICD-10-CM

## 2022-01-01 DIAGNOSIS — F3341 Major depressive disorder, recurrent, in partial remission: Secondary | ICD-10-CM | POA: Diagnosis not present

## 2022-01-01 DIAGNOSIS — R7303 Prediabetes: Secondary | ICD-10-CM | POA: Diagnosis not present

## 2022-01-01 DIAGNOSIS — K5900 Constipation, unspecified: Secondary | ICD-10-CM | POA: Diagnosis not present

## 2022-01-02 MED ORDER — ATORVASTATIN CALCIUM 20 MG PO TABS: 20.0000 mg | ORAL_TABLET | Freq: Every day | ORAL | 4 refills | Status: DC

## 2022-01-31 ENCOUNTER — Ambulatory Visit: Payer: Self-pay | Admitting: Licensed Clinical Social Worker

## 2022-02-01 NOTE — Patient Outreach (Signed)
  Care Coordination   Initial Visit Note   02/01/2022 Name: Michael [name] MRN: [id number] DOB: 1961/04/10  Michael [name] is a 61 y.o. year old male who sees Marcine Matar, MD for primary care. I spoke with  Michael [name] by phone today.  What matters to the patients health and wellness today?  Management of Health Conditions    Goals Addressed             This Visit's Progress    COMPLETED: Care Coordination Activities-No LCSW f/up required       Care Coordination Interventions: Active listening / Reflection utilized  Emotional Support Provided Pt is concerned about management of health conditions due to ongoing pain in feet. He is interested in completing labwork. LCSW sent message to [employer],  Michael [name], to schedule PCP appt with pt Pt is interested in follow up with RNCM. LCSW sent message to [facility] for f/up Pt is knowledgeable of supportive resources in the community, such as, [employer]        SDOH assessments and interventions completed:  Yes  SDOH Interventions Today    Flowsheet Row Most Recent Value  SDOH Interventions   Food Insecurity Interventions Intervention Not Indicated  Housing Interventions Intervention Not Indicated  Transportation Interventions Intervention Not Indicated        Care Coordination Interventions Activated:  Yes  Care Coordination Interventions:  Yes, provided   Follow up plan: No further intervention required from LCSW  Encounter Outcome:  Pt. Visit Completed   Jenel Lucks, MSW, LCSW [facility] Care Management [facility]  [facility] [email or web address] Phone [phone] 2:29 PM

## 2022-02-01 NOTE — Patient Instructions (Signed)
Visit Information  Thank you for taking time to visit with me today. Please don't hesitate to contact me if I can be of assistance to you.   Following are the goals we discussed today:   Goals Addressed             This Visit's Progress    COMPLETED: Care Coordination Activities-No LCSW f/up required       Care Coordination Interventions: Active listening / Reflection utilized  Emotional Support Provided Pt is concerned about management of health conditions due to ongoing pain in feet. He is interested in completing labwork. LCSW sent message to [employer],  Sabino Snipes, to schedule PCP appt with pt Pt is interested in follow up with RNCM. LCSW sent message to [facility] for f/up Pt is knowledgeable of supportive resources in the community, such as, [employer]        If you are experiencing a Mental Health or Behavioral Health Crisis or need someone to talk to, please call the Suicide and Crisis Lifeline: 988 call 911   Patient verbalizes understanding of instructions and care plan provided today and agrees to view in [place]. Active MyChart status and patient understanding of how to access instructions and care plan via MyChart confirmed with patient.     The care management team will reach out to the patient again over the next 14 days.   Jenel Lucks, MSW, LCSW [facility] Care Management [place]  [facility] [email or web address] Phone [phone] 2:30 PM

## 2022-02-05 ENCOUNTER — Ambulatory Visit: Payer: Self-pay

## 2022-02-05 ENCOUNTER — Other Ambulatory Visit: Payer: Self-pay | Admitting: Internal Medicine

## 2022-02-05 DIAGNOSIS — I10 Essential (primary) hypertension: Secondary | ICD-10-CM

## 2022-02-05 NOTE — Telephone Encounter (Signed)
     Chief Complaint: Right foot pain. Asking to be worked in. Symptoms: Pain Frequency: weeks Pertinent Negatives: Patient denies swelling Disposition: [] ED /[] Urgent Care (no appt availability in office) / [] Appointment(In office/virtual)/ []  [place] [facility]/ [] Home Care/ [] Refused Recommended Disposition /[] [place] Mobile Bus/ [x]  Follow-up with PCP Additional Notes: Please advise pt  Answer Assessment - Initial Assessment Questions 1. ONSET: "When did the pain start?"      June 2. LOCATION: "Where is the pain located?"      Right 3. PAIN: "How bad is the pain?"    (Scale 1-10; or mild, moderate, severe)  - MILD (1-3): doesn't interfere with normal activities.   - MODERATE (4-7): interferes with normal activities (e.g., work or school) or awakens from sleep, limping.   - SEVERE (8-10): excruciating pain, unable to do any normal activities, unable to walk.      Now - not bad today 4. WORK OR EXERCISE: "Has there been any recent work or exercise that involved this part of the body?"      No 5. CAUSE: "What do you think is causing the foot pain?"     Maybe gout 6. OTHER SYMPTOMS: "Do you have any other symptoms?" (e.g., leg pain, rash, fever, numbness)     No 7. PREGNANCY: "Is there any chance you are pregnant?" "When was your last menstrual period?"     N/a  Protocols used: Foot Pain-A-AH

## 2022-02-06 ENCOUNTER — Telehealth: Payer: Self-pay | Admitting: Internal Medicine

## 2022-02-06 MED ORDER — AMLODIPINE BESYLATE 10 MG PO TABS: 10.0000 mg | ORAL_TABLET | Freq: Every day | ORAL | 1 refills | Status: DC

## 2022-02-06 NOTE — Telephone Encounter (Signed)
Copied from CRM [id number]. Topic: General - Other >> Feb 05, 2022  3:01 PM [place] B wrote: Reason for CRM: patient asking for lab orders for sugar, because he thinks he may have gout and was told by Dr Laural Benes.

## 2022-02-06 NOTE — Telephone Encounter (Signed)
Called spoke w/ pt in regards to being scheduled. Pt is scheduled appt Rt Foot pain on Mon 09/18@2 :50pm w/ Zelda. Pt expressed understanding. -------DD,RMA

## 2022-02-06 NOTE — Telephone Encounter (Signed)
Checked schedule & next available appt. is 09/29@9 :10am in 2wks. Wanted to check if ok w/ pcp to schedule pt, due to note per foot pain?  Or if pcp had any or suggestion/ instruction before placing on schedule. Please advise.-------DD,RMA

## 2022-02-11 ENCOUNTER — Ambulatory Visit: Payer: PPO | Attending: Nurse Practitioner | Admitting: Nurse Practitioner

## 2022-02-11 ENCOUNTER — Encounter: Payer: Self-pay | Admitting: Nurse Practitioner

## 2022-02-11 VITALS — BP 167/100 | HR 78 | Ht 71.0 in | Wt 261.0 lb

## 2022-02-11 DIAGNOSIS — M79671 Pain in right foot: Secondary | ICD-10-CM | POA: Diagnosis not present

## 2022-02-11 DIAGNOSIS — I10 Essential (primary) hypertension: Secondary | ICD-10-CM

## 2022-02-11 DIAGNOSIS — R7303 Prediabetes: Secondary | ICD-10-CM

## 2022-02-11 NOTE — Progress Notes (Signed)
Assessment & Plan:  Michael [name] was seen today for foot pain.  Diagnoses and all orders for this visit:  Right foot pain -     Uric Acid -     CBC with Differential -     DG Foot Complete Right; Future  Primary hypertension -     CMP14+EGFR Continue amlodipine 10 mg daily, hydralazine 10 mg twice daily and Zestoretic 20-25 mg daily as prescribed.  Reminded to bring in blood pressure log for follow  up appointment.  RECOMMENDATIONS: DASH/Mediterranean Diets are healthier choices for HTN.    Prediabetes -     Hemoglobin A1c -     CMP14+EGFR    Patient has been counseled on age-appropriate routine health concerns for screening and prevention. These are reviewed and up-to-date. Referrals have been placed accordingly. Immunizations are up-to-date or declined.    Subjective:   Chief Complaint  Patient presents with   Foot Pain   Foot Pain Pertinent negatives include no chest pain, coughing, fever, headaches, myalgias, nausea or vomiting.   Michael [name] 61 y.o. male presents to office today with complaints of previous right foot pain which has mostly resolved today.  He has a past medical history of Arthritis, Hepatitis, Hypertension, and Pre-diabetes.    Notes severe dorsal midfoot pain last week. Associated symptoms at that time: swelling, redness, pain He does not currently smoke tobacco. Denies any injury or trauma to the foot. Pain described as sharp.    HTN Elevated today. He has run out of amlodipine and will not be able to pick it up until Wednesday.  BP Readings from Last 3 Encounters:  02/11/22 (!) 167/100  11/23/21 130/70  03/16/21 132/82     Prediabetes Well controlled without any oral diabetic medications.  Lab Results  Component Value Date   HGBA1C 5.8 (H) 09/22/2020     Review of Systems  Constitutional:  Negative for fever, malaise/fatigue and weight loss.  HENT: Negative.  Negative for nosebleeds.   Eyes: Negative.  Negative for blurred vision,  double vision and photophobia.  Respiratory: Negative.  Negative for cough and shortness of breath.   Cardiovascular: Negative.  Negative for chest pain, palpitations and leg swelling.  Gastrointestinal: Negative.  Negative for heartburn, nausea and vomiting.  Musculoskeletal: Negative.  Negative for myalgias.  Neurological: Negative.  Negative for dizziness, focal weakness, seizures and headaches.  Psychiatric/Behavioral: Negative.  Negative for suicidal ideas.     Past Medical History:  Diagnosis Date   Arthritis    Hepatitis    Hep C rx took treatment.   Hypertension    Pre-diabetes     Past Surgical History:  Procedure Laterality Date   COLONOSCOPY     left knee arthroscopy      left rotator cuff surgery      REVISION TOTAL KNEE ARTHROPLASTY Left 06/15/2018   SHOULDER OPEN ROTATOR CUFF REPAIR Right 01/29/2013   Procedure: RIGHT SHOULDER MINI ROTATOR CUFF REPAIR;  Surgeon: Johnn Hai, MD;  Location: [facility];  Service: Orthopedics;  Laterality: Right;  With ANCHORS   TOTAL HIP ARTHROPLASTY Left 06/08/2020   Procedure: TOTAL HIP ARTHROPLASTY ANTERIOR APPROACH;  Surgeon: Rod Can, MD;  Location: [facility];  Service: Orthopedics;  Laterality: Left;   TOTAL KNEE ARTHROPLASTY Left 02/12/2017   TOTAL KNEE ARTHROPLASTY Left 02/12/2017   Procedure: LEFT TOTAL KNEE ARTHROPLASTY;  Surgeon: Leandrew Koyanagi, MD;  Location: [place];  Service: Orthopedics;  Laterality: Left;   TOTAL KNEE REVISION Left 06/15/2018  Procedure: LEFT TOTAL KNEE REVISION;  Surgeon: Leandrew Koyanagi, MD;  Location: [place];  Service: Orthopedics;  Laterality: Left;   TOTAL KNEE REVISION Left 03/08/2019   Procedure: LEFT TOTAL KNEE REVISION;  Surgeon: Leandrew Koyanagi, MD;  Location: [place];  Service: Orthopedics;  Laterality: Left;   TOTAL KNEE REVISION Left 09/27/2020   Procedure: Left total knee arthroplasty revision;  Surgeon: Rod Can, MD;  Location: [facility];  Service: Orthopedics;  Laterality: Left;    Family  History  Problem Relation Age of Onset   CAD Mother    Deep vein thrombosis Brother     Social History Reviewed with no changes to be made today.   Outpatient Medications Prior to Visit  Medication Sig Dispense Refill   amLODipine (NORVASC) 10 MG tablet Take 1 tablet (10 mg total) by mouth daily. 90 tablet 1   atorvastatin (LIPITOR) 20 MG tablet Take 1 tablet (20 mg total) by mouth daily. 30 tablet 4   hydrALAZINE (APRESOLINE) 10 MG tablet TAKE 1 TABLET BY MOUTH TWICE A DAY 180 tablet 0   lisinopril-hydrochlorothiazide (ZESTORETIC) 20-25 MG tablet Take 1 tablet by mouth daily. 90 tablet 2   methadone (DOLOPHINE) 10 MG tablet Take 3 tablets (30 mg total) by mouth every 8 (eight) hours. (Patient taking differently: Take 60 mg by mouth daily.) 30 tablet 0   varenicline (CHANTIX) 0.5 MG tablet 1 tab PO daily x 3 days then increase to 1 tab BID x 4 days (Patient not taking: Reported on 02/11/2022) 11 tablet 0   No facility-administered medications prior to visit.    No Known Allergies     Objective:    BP (!) 167/100   Pulse 78   Ht _0  (1.803 m)   Wt 261 lb (118.4 kg)   SpO2 96%   BMI 36.40 kg/m  Wt Readings from Last 3 Encounters:  02/11/22 261 lb (118.4 kg)  11/23/21 255 lb 9.6 oz (115.9 kg)  03/16/21 249 lb (112.9 kg)    Physical Exam Vitals and nursing note reviewed.  Constitutional:      Appearance: He is well-developed.  HENT:     Head: Normocephalic and atraumatic.  Cardiovascular:     Rate and Rhythm: Normal rate and regular rhythm.     Heart sounds: Normal heart sounds. No murmur heard.    No friction rub. No gallop.  Pulmonary:     Effort: Pulmonary effort is normal. No tachypnea or respiratory distress.     Breath sounds: Normal breath sounds. No decreased breath sounds, wheezing, rhonchi or rales.  Chest:     Chest wall: No tenderness.  Abdominal:     General: Bowel sounds are normal.     Palpations: Abdomen is soft.  Musculoskeletal:        General:  Normal range of motion.     Cervical back: Normal range of motion.  Skin:    General: Skin is warm and dry.  Neurological:     Mental Status: He is alert and oriented to person, place, and time.     Coordination: Coordination normal.  Psychiatric:        Behavior: Behavior normal. Behavior is cooperative.        Thought Content: Thought content normal.        Judgment: Judgment normal.          Patient has been counseled extensively about nutrition and exercise as well as the importance of adherence with medications and regular follow-up. The  patient was given clear instructions to go to ER or return to medical center if symptoms don't improve, worsen or new problems develop. The patient verbalized understanding.   Follow-up: Return in about 3 months (around 05/13/2022) for HTN.   Gildardo Pounds, FNP-BC [facility] and [place], [place]   02/11/2022, 2:57 PM

## 2022-02-12 ENCOUNTER — Other Ambulatory Visit: Payer: Self-pay | Admitting: Nurse Practitioner

## 2022-02-12 ENCOUNTER — Encounter: Payer: Self-pay | Admitting: Nurse Practitioner

## 2022-02-12 DIAGNOSIS — M79671 Pain in right foot: Secondary | ICD-10-CM

## 2022-02-12 LAB — CMP14+EGFR
ALT: 18 IU/L (ref 0–44)
AST: 17 IU/L (ref 0–40)
Albumin/Globulin Ratio: 1.7 (ref 1.2–2.2)
Albumin: 4.4 g/dL (ref 3.8–4.9)
Alkaline Phosphatase: 89 IU/L (ref 44–121)
BUN/Creatinine Ratio: 17 (ref 10–24)
BUN: 15 mg/dL (ref 8–27)
Bilirubin Total: <0.2 mg/dL (ref 0.0–1.2)
CO2: 24 mmol/L (ref 20–29)
Calcium: 10 mg/dL (ref 8.6–10.2)
Chloride: 97 mmol/L (ref 96–106)
Creatinine, Ser: 0.87 mg/dL (ref 0.76–1.27)
Globulin, Total: 2.6 g/dL (ref 1.5–4.5)
Glucose: 81 mg/dL (ref 70–99)
Potassium: 4.4 mmol/L (ref 3.5–5.2)
Sodium: 137 mmol/L (ref 134–144)
Total Protein: 7 g/dL (ref 6.0–8.5)
eGFR: 99 mL/min/{1.73_m2} (ref >59)

## 2022-02-12 LAB — CBC WITH DIFFERENTIAL/PLATELET
Basophils Absolute: 0.1 10*3/uL (ref 0.0–0.2)
Basos: 1 %
EOS (ABSOLUTE): 0.2 10*3/uL (ref 0.0–0.4)
Eos: 3 %
Hematocrit: 41 % (ref 37.5–51.0)
Hemoglobin: 14 g/dL (ref 13.0–17.7)
Immature Grans (Abs): 0 10*3/uL (ref 0.0–0.1)
Immature Granulocytes: 0 %
Lymphocytes Absolute: 2.6 10*3/uL (ref 0.7–3.1)
Lymphs: 32 %
MCH: 30 pg (ref 26.6–33.0)
MCHC: 34.1 g/dL (ref 31.5–35.7)
MCV: 88 fL (ref 79–97)
Monocytes Absolute: 0.7 10*3/uL (ref 0.1–0.9)
Monocytes: 8 %
Neutrophils Absolute: 4.4 10*3/uL (ref 1.4–7.0)
Neutrophils: 56 %
Platelets: 328 10*3/uL (ref 150–450)
RBC: 4.66 x10E6/uL (ref 4.14–5.80)
RDW: 12.9 % (ref 11.6–15.4)
WBC: 8 10*3/uL (ref 3.4–10.8)

## 2022-02-12 LAB — HEMOGLOBIN A1C
Est. average glucose Bld gHb Est-mCnc: 117 mg/dL
Hgb A1c MFr Bld: 5.7 % — ABNORMAL HIGH (ref 4.8–5.6)

## 2022-02-12 LAB — URIC ACID: Uric Acid: 7.2 mg/dL (ref 3.8–8.4)

## 2022-02-12 MED ORDER — DICLOFENAC SODIUM 1 % EX GEL: 2.0000 g | Freq: Four times a day (QID) | CUTANEOUS | 0 refills | Status: AC

## 2022-02-15 ENCOUNTER — Other Ambulatory Visit: Payer: Self-pay | Admitting: Nurse Practitioner

## 2022-02-15 DIAGNOSIS — M79671 Pain in right foot: Secondary | ICD-10-CM

## 2022-02-15 NOTE — Telephone Encounter (Signed)
Requested medication (s) are due for refill today: yes  Requested medication (s) are on the active medication list: yes  Last refill:  02/12/22  Future visit scheduled: yes  Notes to clinic:  Unable to refill per protocol, last refill by provider 02/12/22, pharmacy request: Product Backordered/Unavailable:ON BACKORDER     Requested Prescriptions  Pending Prescriptions Disp Refills   diclofenac Sodium (VOLTAREN) 1 % GEL [Pharmacy Med Name: DICLOFENAC SODIUM 1% GEL] 200 g 0    Sig: APPLY 2 GRAMS TO AFFECTED AREA 4 TIMES A DAY     Analgesics:  Topicals Failed - 02/15/2022 12:16 PM      Failed - Manual Review: Labs are only required if the patient has taken medication for more than 8 weeks.      Passed - PLT in normal range and within 360 days    Platelets  Date Value Ref Range Status  02/11/2022 328 150 - 450 x10E3/uL Final         Passed - HGB in normal range and within 360 days    Hemoglobin  Date Value Ref Range Status  02/11/2022 14.0 13.0 - 17.7 g/dL Final         Passed - HCT in normal range and within 360 days    Hematocrit  Date Value Ref Range Status  02/11/2022 41.0 37.5 - 51.0 % Final         Passed - Cr in normal range and within 360 days    Creatinine, Ser  Date Value Ref Range Status  02/11/2022 0.87 0.76 - 1.27 mg/dL Final         Passed - eGFR is 30 or above and within 360 days    GFR calc Af Amer  Date Value Ref Range Status  03/30/2020 114 >59 mL/min/1.73 Final    Comment:    **In accordance with recommendations from the NKF-ASN Task force,**   [facility] is in the process of updating its eGFR calculation to the   2021 CKD-EPI creatinine equation that estimates kidney function   without a race variable.    GFR, Estimated  Date Value Ref Range Status  09/28/2020 >60 >60 mL/min Final    Comment:    (NOTE) Calculated using the CKD-EPI Creatinine Equation (2021)    eGFR  Date Value Ref Range Status  02/11/2022 99 >59 mL/min/1.73 Final          Passed - Patient is not pregnant      Passed - Valid encounter within last 12 months    Recent Outpatient Visits           4 days ago Right foot pain   [facility] [place], Zelda W, NP   2 months ago Encounter for Medicare annual wellness exam   [facility] Johnson, Deborah B, MD   7 months ago Essential hypertension   [facility] Johnson, Deborah B, MD   11 months ago Essential hypertension   [facility] Johnson, Deborah B, MD   1 year ago Hospital discharge follow-up   [facility] Johnson, Deborah B, MD       Future Appointments             In 2 months Johnson, Deborah B, MD [facility]              

## 2022-03-17 ENCOUNTER — Other Ambulatory Visit: Payer: Self-pay | Admitting: Internal Medicine

## 2022-03-17 DIAGNOSIS — I10 Essential (primary) hypertension: Secondary | ICD-10-CM

## 2022-04-12 ENCOUNTER — Other Ambulatory Visit: Payer: Self-pay | Admitting: Internal Medicine

## 2022-04-12 DIAGNOSIS — I10 Essential (primary) hypertension: Secondary | ICD-10-CM

## 2022-04-12 NOTE — Telephone Encounter (Signed)
Requested Prescriptions  Pending Prescriptions Disp Refills   lisinopril-hydrochlorothiazide (ZESTORETIC) 20-25 MG tablet [Pharmacy Med Name: LISINOPRIL-HCTZ 20-25 MG TAB] 90 tablet 0    Sig: TAKE 1 TABLET BY MOUTH EVERY DAY     Cardiovascular:  ACEI + Diuretic Combos Failed - 04/12/2022  3:04 AM      Failed - Last BP in normal range    BP Readings from Last 1 Encounters:  02/11/22 (!) 167/100         Passed - Na in normal range and within 180 days    Sodium  Date Value Ref Range Status  02/11/2022 137 134 - 144 mmol/L Final         Passed - K in normal range and within 180 days    Potassium  Date Value Ref Range Status  02/11/2022 4.4 3.5 - 5.2 mmol/L Final         Passed - Cr in normal range and within 180 days    Creatinine, Ser  Date Value Ref Range Status  02/11/2022 0.87 0.76 - 1.27 mg/dL Final         Passed - eGFR is 30 or above and within 180 days    GFR calc Af Amer  Date Value Ref Range Status  03/30/2020 114 >59 mL/min/1.73 Final    Comment:    **In accordance with recommendations from the NKF-ASN Task force,**   [facility] is in the process of updating its eGFR calculation to the   2021 CKD-EPI creatinine equation that estimates kidney function   without a race variable.    GFR, Estimated  Date Value Ref Range Status  09/28/2020 >60 >60 mL/min Final    Comment:    (NOTE) Calculated using the CKD-EPI Creatinine Equation (2021)    eGFR  Date Value Ref Range Status  02/11/2022 99 >59 mL/min/1.73 Final         Passed - Patient is not pregnant      Passed - Valid encounter within last 6 months    Recent Outpatient Visits           2 months ago Right foot pain   [place], Vernia Buff, NP   4 months ago Encounter for [employer] annual wellness exam   [place] Ladell Pier, MD   8 months ago Essential hypertension   [place], Deborah B,  MD   1 year ago Essential hypertension   [place], Deborah B, MD   1 year ago Hospital discharge follow-up   [place], MD       Future Appointments             In 1 month Wynetta Emery Dalbert Batman, MD [place]

## 2022-04-13 ENCOUNTER — Other Ambulatory Visit: Payer: Self-pay | Admitting: Internal Medicine

## 2022-04-13 DIAGNOSIS — E785 Hyperlipidemia, unspecified: Secondary | ICD-10-CM

## 2022-04-15 NOTE — Telephone Encounter (Signed)
Unable to refill per protocol, Rx request is too soon, last refill 01/02/22 for 30 and 4 RF. Will refuse.  Requested Prescriptions  Pending Prescriptions Disp Refills   atorvastatin (LIPITOR) 20 MG tablet [Pharmacy Med Name: ATORVASTATIN 20 MG TABLET] 90 tablet 1    Sig: TAKE 1 TABLET BY MOUTH EVERY DAY     Cardiovascular:  Antilipid - Statins Failed - 04/13/2022  9:28 AM      Failed - Lipid Panel in normal range within the last 12 months    Cholesterol, Total  Date Value Ref Range Status  03/30/2020 197 100 - 199 mg/dL Final   LDL Chol Calc (NIH)  Date Value Ref Range Status  03/30/2020 99 0 - 99 mg/dL Final   HDL  Date Value Ref Range Status  03/30/2020 52 >39 mg/dL Final   Triglycerides  Date Value Ref Range Status  03/30/2020 272 (H) 0 - 149 mg/dL Final         Passed - Patient is not pregnant      Passed - Valid encounter within last 12 months    Recent Outpatient Visits           2 months ago Right foot pain   [facility] [place], Shea Stakes, NP   4 months ago 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

## 2022-04-27 ENCOUNTER — Ambulatory Visit
Admission: EM | Admit: 2022-04-27 | Discharge: 2022-04-27 | Disposition: A | Discharge status: Home / Self Care | Payer: PPO | Attending: Internal Medicine | Admitting: Internal Medicine

## 2022-04-27 ENCOUNTER — Ambulatory Visit (INDEPENDENT_AMBULATORY_CARE_PROVIDER_SITE_OTHER): Payer: PPO

## 2022-04-27 ENCOUNTER — Encounter: Payer: Self-pay | Admitting: Emergency Medicine

## 2022-04-27 DIAGNOSIS — J069 Acute upper respiratory infection, unspecified: Secondary | ICD-10-CM

## 2022-04-27 DIAGNOSIS — R509 Fever, unspecified: Secondary | ICD-10-CM

## 2022-04-27 LAB — POCT INFLUENZA A/B
Influenza A, POC: NEGATIVE
Influenza B, POC: NEGATIVE

## 2022-04-27 MED ORDER — AMOXICILLIN-POT CLAVULANATE 875-125 MG PO TABS: 1.0000 | ORAL_TABLET | Freq: Two times a day (BID) | ORAL | 0 refills | Status: DC

## 2022-04-27 NOTE — ED Provider Notes (Signed)
[facility]    CSN: [id number] Arrival date & time: 04/27/22  0809      History   Chief Complaint Chief Complaint  Patient presents with   Nasal Congestion   Sore Throat   Fever   Generalized Body Aches    HPI Michael [name] is a 61 y.o. male.   Patient presents with nasal congestion, fever, generalized body aches, sore throat.  Patient reports that nasal congestion has been present for approximately 1 month.  He states that he had cold-like symptoms approximately 1 month ago that have now improved and he was left with persistent nasal congestion.  He developed new onset fever cough, generalized body aches, sore throat yesterday.  Tmax at home was 100.9.  He reports that he had cough at start of symptoms 1 month ago but has not had a cough since.  Denies any new known sick contacts.  Denies chest pain, shortness of breath, ear pain, nausea, vomiting, diarrhea, abdominal pain.  Patient has taken Mucinex and Alka-Seltzer cold and flu with minimal improvement in symptoms.  Patient denies formal diagnosis of asthma or COPD and does not smoke cigarettes.   Sore Throat  Fever   Past Medical History:  Diagnosis Date   Arthritis    Hepatitis    Hep C rx took treatment.   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   Positive depression screening 05/22/2017   Hepatitis C virus infection cured after antiviral drug therapy 01/19/2017   Hyperlipidemia 10/23/2016   Hypertension 10/18/2016   Tobacco dependence 10/18/2016   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   Right rotator cuff tear 01/29/2013    Past  Surgical History:  Procedure Laterality Date   COLONOSCOPY     left knee arthroscopy      left rotator cuff surgery      REVISION TOTAL KNEE ARTHROPLASTY Left 06/15/2018   SHOULDER OPEN ROTATOR CUFF REPAIR Right 01/29/2013   Procedure: RIGHT SHOULDER MINI ROTATOR CUFF REPAIR;  Surgeon: Javier Docker, MD;  [facility];  Service: Orthopedics;  Laterality: Right;  With ANCHORS   TOTAL HIP ARTHROPLASTY Left 06/08/2020   Procedure: TOTAL HIP ARTHROPLASTY ANTERIOR APPROACH;  Surgeon: Samson Frederic, MD;  Location: [facility];  Service: Orthopedics;  Laterality: Left;   TOTAL KNEE ARTHROPLASTY Left 02/12/2017   TOTAL KNEE ARTHROPLASTY Left 02/12/2017   Procedure: LEFT TOTAL KNEE ARTHROPLASTY;  Surgeon: Tarry Kos, MD;  [facility];  Service: Orthopedics;  Laterality: Left;   TOTAL KNEE REVISION Left 06/15/2018   Procedure: LEFT TOTAL KNEE REVISION;  Surgeon: Tarry Kos, MD;  Location: [facility];  Service: Orthopedics;  Laterality: Left;   TOTAL KNEE REVISION Left 03/08/2019   Procedure: LEFT TOTAL KNEE REVISION;  Surgeon: Tarry Kos, MD;  [facility];  Service: Orthopedics;  Laterality: Left;   TOTAL KNEE REVISION Left 09/27/2020   Procedure: Left total knee arthroplasty revision;  Surgeon: Samson Frederic, MD;  Location[facility];  Service: Orthopedics;  Laterality: Left;       Home Medications    Prior to Admission medications   Medication Sig Start  Date End Date Taking? Authorizing Provider  amoxicillin-clavulanate (AUGMENTIN) 875-125 MG tablet Take 1 tablet by mouth every 12 (twelve) hours. 04/27/22  Yes Quinnton Bury, Rolly Salter E, FNP  amLODipine (NORVASC) 10 MG tablet Take 1 tablet (10 mg total) by mouth daily. 02/06/22   Marcine Matar, MD  atorvastatin (LIPITOR) 20 MG tablet Take 1 tablet (20 mg total) by mouth daily. 01/02/22   Marcine Matar, MD  hydrALAZINE (APRESOLINE) 10 MG tablet TAKE 1 TABLET BY MOUTH TWICE A DAY 03/18/22   Marcine Matar, MD   lisinopril-hydrochlorothiazide (ZESTORETIC) 20-25 MG tablet TAKE 1 TABLET BY MOUTH EVERY DAY 04/12/22   Marcine Matar, MD  methadone (DOLOPHINE) 10 MG tablet Take 3 tablets (30 mg total) by mouth every 8 (eight) hours. Patient taking differently: Take 60 mg by mouth daily. 07/05/20   Marcine Matar, MD  varenicline (CHANTIX) 0.5 MG tablet 1 tab PO daily x 3 days then increase to 1 tab BID x 4 days Patient not taking: Reported on 02/11/2022 07/19/21   Marcine Matar, MD  carvedilol (COREG) 6.25 MG tablet Take 1 tablet (6.25 mg total) by mouth 2 (two) times daily with a meal. 06/10/19 06/17/19  Marcine Matar, MD    Family History Family History  Problem Relation Age of Onset   CAD Mother    Deep vein thrombosis Brother     Social History Social History   Tobacco Use   Smoking status: Former    Packs/day: 1.00    Years: 37.00    Total pack years: 37.00    Types: Cigarettes    Quit date: 04/20/2020    Years since quitting: 2.0   Smokeless tobacco: Never   Tobacco comments:    Occasionally will smoke one  Vaping Use   Vaping Use: Never used  Substance Use Topics   Alcohol use: Yes    Comment: occational   Drug use: No    Comment: hx of marijuana and cocaine, LSD     Allergies   Patient has no known allergies.   Review of Systems Review of Systems Per HPI  Physical Exam Triage Vital Signs ED Triage Vitals  Enc Vitals Group     BP 04/27/22 0826 (!) 148/90     Pulse Rate 04/27/22 0826 88     Resp 04/27/22 0826 18     Temp 04/27/22 0826 98.6 F (37 C)     Temp src --      SpO2 04/27/22 0826 94 %     Weight --      Height --      Head Circumference --      Peak Flow --      Pain Score 04/27/22 0825 8     Pain Loc --      Pain Edu? --      Excl. in GC? --    No data found.  Updated Vital Signs BP (!) 148/90   Pulse 88   Temp 98.6 F (37 C)   Resp 18   SpO2 96%   Visual Acuity Right Eye Distance:   Left Eye Distance:   Bilateral  Distance:    Right Eye Near:   Left Eye Near:    Bilateral Near:     Physical Exam Constitutional:      General: He is not in acute distress.    Appearance: Normal appearance. He is not toxic-appearing or diaphoretic.  HENT:     Head: Normocephalic and atraumatic.  Right Ear: Tympanic membrane and ear canal normal.     Left Ear: Tympanic membrane and ear canal normal.     Nose: Congestion present.     Mouth/Throat:     Mouth: Mucous membranes are moist.     Pharynx: No posterior oropharyngeal erythema.  Eyes:     Extraocular Movements: Extraocular movements intact.     Conjunctiva/sclera: Conjunctivae normal.     Pupils: Pupils are equal, round, and reactive to light.  Cardiovascular:     Rate and Rhythm: Normal rate and regular rhythm.     Pulses: Normal pulses.     Heart sounds: Normal heart sounds.  Pulmonary:     Effort: Pulmonary effort is normal. No respiratory distress.     Breath sounds: Normal breath sounds. No stridor. No wheezing, rhonchi or rales.  Abdominal:     General: Abdomen is flat. Bowel sounds are normal.     Palpations: Abdomen is soft.  Musculoskeletal:        General: Normal range of motion.     Cervical back: Normal range of motion.  Skin:    General: Skin is warm and dry.  Neurological:     General: No focal deficit present.     Mental Status: He is alert and oriented to person, place, and time. Mental status is at baseline.  Psychiatric:        Mood and Affect: Mood normal.        Behavior: Behavior normal.      UC Treatments / Results  Labs (all labs ordered are listed, but only abnormal results are displayed) Labs Reviewed  POCT INFLUENZA A/B    EKG   Radiology DG Chest 2 View  Result Date: 04/27/2022 CLINICAL DATA:  Fever and body aches. EXAM: CHEST - 2 VIEW COMPARISON:  03/02/2019 FINDINGS: The heart size and mediastinal contours are within normal limits. Both lungs are clear. The visualized skeletal structures are  unremarkable. IMPRESSION: No active cardiopulmonary disease. Electronically Signed   By: Danae Orleans M.D.   On: 04/27/2022 09:17    Procedures Procedures (including critical care time)  Medications Ordered in UC Medications - No data to display  Initial Impression / Assessment and Plan / UC Course  I have reviewed the triage vital signs and the nursing notes.  Pertinent labs & imaging results that were available during my care of the patient were reviewed by me and considered in my medical decision making (see chart for details).     Given new onset fever and generalized body aches, I was concerned for new viral illness.  Rapid flu was completed due to this which was negative.  Given new onset fever and persistent upper respiratory symptoms, there was also concern for possible community-acquired pneumonia so chest x-ray was completed which was negative for any acute cardiopulmonary process.  Given duration of nasal congestion, there is still concern for secondary bacterial infection so will opt to treat with Augmentin antibiotic.  Discussed supportive care and symptom management with patient as well.  Fever monitoring and management discussed with patient.  Discussed return precautions.  Patient verbalized understanding and was agreeable with plan. Final Clinical Impressions(s) / UC Diagnoses   Final diagnoses:  Acute upper respiratory infection     Discharge Instructions      I have sent you an antibiotic to treat upper respiratory infection.  Please follow-up if symptoms persist or worsen.     ED Prescriptions     Medication Sig Dispense Auth. Provider  amoxicillin-clavulanate (AUGMENTIN) 875-125 MG tablet Take 1 tablet by mouth every 12 (twelve) hours. 14 tablet [place], Acie FredricksonHaley E, [place]NP      PDMP not reviewed this encounter.   Gustavus BryantMound, Krystin Keeven E, [place]NP 04/27/22 [phone]

## 2022-04-27 NOTE — ED Triage Notes (Signed)
Pt is present today with c/o congestion, fever, body aches, and sore throat. Pt states his fever and body aches started last night and the congestion and sore throat started one month ago.

## 2022-04-27 NOTE — Discharge Instructions (Signed)
I have sent you an antibiotic to treat upper respiratory infection.  Please follow-up if symptoms persist or worsen.

## 2022-05-13 ENCOUNTER — Encounter: Payer: Self-pay | Admitting: Internal Medicine

## 2022-05-13 ENCOUNTER — Ambulatory Visit: Payer: PPO | Attending: Internal Medicine | Admitting: Internal Medicine

## 2022-05-13 VITALS — BP 140/82 | HR 87 | Temp 98.1°F | Ht 71.0 in | Wt 259.0 lb

## 2022-05-13 DIAGNOSIS — Z2821 Immunization not carried out because of patient refusal: Secondary | ICD-10-CM | POA: Diagnosis not present

## 2022-05-13 DIAGNOSIS — F1120 Opioid dependence, uncomplicated: Secondary | ICD-10-CM | POA: Diagnosis not present

## 2022-05-13 DIAGNOSIS — M79671 Pain in right foot: Secondary | ICD-10-CM

## 2022-05-13 DIAGNOSIS — I10 Essential (primary) hypertension: Secondary | ICD-10-CM | POA: Diagnosis not present

## 2022-05-13 DIAGNOSIS — F17200 Nicotine dependence, unspecified, uncomplicated: Secondary | ICD-10-CM

## 2022-05-13 DIAGNOSIS — Z5320 Procedure and treatment not carried out because of patient's decision for unspecified reasons: Secondary | ICD-10-CM | POA: Diagnosis not present

## 2022-05-13 DIAGNOSIS — E669 Obesity, unspecified: Secondary | ICD-10-CM

## 2022-05-13 MED ORDER — HYDRALAZINE HCL 10 MG PO TABS: 20.0000 mg | ORAL_TABLET | Freq: Two times a day (BID) | ORAL | 1 refills | Status: DC

## 2022-05-13 NOTE — Patient Instructions (Signed)
Increase hydralazine to 20 mg twice a day.  An updated prescription has been sent to your pharmacy.

## 2022-05-13 NOTE — Progress Notes (Signed)
Patient ID: Michael [name], male    DOB: 12-Aug-1960  MRN: [id number]  CC: Hypertension (HTN f/u. Vickey Huger of the R foot, unable to get -x-ray. Franchot Erichsen flu vax.)   Subjective: Michael [name] is a 61 y.o. male who presents for chronic ds management His concerns today include:  Pt with hx of HTN, HL, preDM, obesity, OA LT hip and lumbar spine, Hep C treated with interferon/rib combo in [place] 2005, tobacco abuse, substance use disorder on Methadone.    Seen by our NP 01/2022 with right foot pain (dorsal midfoot) and swelling. Uric acid level was 7.2. X-ray ordered of the foot which he has not had done as yet. Swelling has resolve but pain comes and goes.  Notice it most when standing on his feet and at nights when he gets up to walk to bathroom at nights.  Thinks it is arthritis or poor circulation  HYPERTENSION Currently taking: see medication list.  He is on Norvasc 10 mg daily, lisinopril/hydrochlorothiazide 20/25 mg 1 tablet daily and hydralazine 10 mg one tablet twice a day. Med Adherence: [x]  Yes    []  No Medication side effects: []  Yes    [x]  No Adherence with salt restriction: [x]  Yes   Home Monitoring?: [x]  Yes    []  No Monitoring Frequency: with a wrist cuff. 3-4x/day Home BP results range:  120-140s/85-90 SOB? []  Yes    [x]  No Chest Pain?: []  Yes    [x]  No Leg swelling?: [x]  Yes -goes aay with elevation Headaches?: []  Yes    [x]  No Dizziness? []  Yes    [x]  No Comments: also taking and tolerating Lipitor 20 mg daily.  Last LDL 99  PreDM/Obesity: does some stretching exercises in a.m.  Walks 5x/wk for 30-60 mins for exercise -doing better with eating habits.  Wgh fluctuates.  Drinks 8-12 bottles water a day.  He has cut back on drinking Coca [employer], has cut back on white carbs, eating more veggies  Tob dep: Reports he had a few cigarettes and ETOH beverages drinks few wks ago at a .  Has not smoked since then Smoked since age 64 1/2 to 1 pk a day.  Has quit  x 1 yr with a few slip ups  SUD:  he has been weaning himself of Methadone.  Started at 55 mg; down to 20 mg. Trying to get into Suboxone program instead.      HM: declines flu shot, COVID booster and RSV today.  Declines lung cancer screen. 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   Status post revision of total knee replacement, 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   Unilateral primary osteoarthritis, left hip 09/08/2018   S/P revision of total knee, left 06/15/2018   Positive depression screening 05/22/2017   Hepatitis C virus infection cured after antiviral drug therapy 01/19/2017   Hyperlipidemia 10/23/2016   Hypertension 10/18/2016   Tobacco dependence 10/18/2016   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   Right rotator cuff tear 01/29/2013     Current Outpatient Medications on File Prior to Visit  Medication Sig Dispense Refill   amLODipine (NORVASC) 10 MG tablet Take 1 tablet (10 mg total) by mouth daily. 90 tablet 1   atorvastatin (LIPITOR) 20 MG tablet Take 1 tablet (20 mg total) by mouth daily. 30 tablet  4   lisinopril-hydrochlorothiazide (ZESTORETIC) 20-25 MG tablet TAKE 1 TABLET BY MOUTH EVERY DAY 90 tablet 0   methadone (DOLOPHINE) 10 MG tablet Take 3 tablets (30 mg total) by mouth every 8 (eight) hours. (Patient taking differently: Take 60 mg by mouth daily.) 30 tablet 0   varenicline (CHANTIX) 0.5 MG tablet 1 tab PO daily x 3 days then increase to 1 tab BID x 4 days (Patient not taking: Reported on 02/11/2022) 11 tablet 0   [DISCONTINUED] carvedilol (COREG) 6.25 MG tablet Take 1 tablet (6.25 mg total) by mouth 2 (two) times daily with a meal. 60 tablet 3   No current facility-administered medications on file prior to visit.    No Known Allergies  Social History    Socioeconomic History   Marital status: Single    Spouse name: Not on file   Number of children: 0   Years of education: 12   Highest education level: Not on file  Occupational History   Occupation: Chartered certified accountantmachinist  Tobacco Use   Smoking status: Former    Packs/day: 1.00    Years: 37.00    Total pack years: 37.00    Types: Cigarettes    Quit date: 04/20/2020    Years since quitting: 2.0   Smokeless tobacco: Never   Tobacco comments:    Occasionally will smoke one  Vaping Use   Vaping Use: Never used  Substance and Sexual Activity   Alcohol use: Yes    Comment: occational   Drug use: No    Comment: hx of marijuana and cocaine, LSD   Sexual activity: Yes    Partners: Female    Birth control/protection: None  Other Topics Concern   Not on file  Social History Narrative   Not on file   Social Determinants of Health   Financial Resource Strain: Not on file  Food Insecurity: No Food Insecurity (01/31/2022)   Hunger Vital Sign    Worried About Running Out of Food in the Last Year: Never true    Ran Out of Food in the Last Year: Never true  Transportation Needs: No Transportation Needs (01/31/2022)   PRAPARE - Administrator, Civil ServiceTransportation    Lack of Transportation (Medical): No    Lack of Transportation (Non-Medical): No  Physical Activity: Not on file  Stress: Not on file  Social Connections: Not on file  Intimate Partner Violence: Not on file    Family History  Problem Relation Age of Onset   CAD Mother    Deep vein thrombosis Brother     Past Surgical History:  Procedure Laterality Date   COLONOSCOPY     left knee arthroscopy      left rotator cuff surgery      REVISION TOTAL KNEE ARTHROPLASTY Left 06/15/2018   SHOULDER OPEN ROTATOR CUFF REPAIR Right 01/29/2013   Procedure: RIGHT SHOULDER MINI ROTATOR CUFF REPAIR;  Surgeon: Javier DockerJeffrey C Beane, MD;  Location: [facility];  Service: Orthopedics;  Laterality: Right;  With ANCHORS   TOTAL HIP ARTHROPLASTY Left 06/08/2020   Procedure: TOTAL  HIP ARTHROPLASTY ANTERIOR APPROACH;  Surgeon: Samson FredericSwinteck, Brian, MD;  Location: [facility];  Service: Orthopedics;  Laterality: Left;   TOTAL KNEE ARTHROPLASTY Left 02/12/2017   TOTAL KNEE ARTHROPLASTY Left 02/12/2017   Procedure: LEFT TOTAL KNEE ARTHROPLASTY;  Surgeon: Tarry KosXu, Naiping M, MD;  Location: [facility];  Service: Orthopedics;  Laterality: Left;   TOTAL KNEE REVISION Left 06/15/2018   Procedure: LEFT TOTAL KNEE REVISION;  Surgeon: Tarry KosXu, Naiping M, MD;  Location:  [facility];  Service: Orthopedics;  Laterality: Left;   TOTAL KNEE REVISION Left 03/08/2019   Procedure: LEFT TOTAL KNEE REVISION;  Surgeon: Tarry Kos, MD;  Location: [facility];  Service: Orthopedics;  Laterality: Left;   TOTAL KNEE REVISION Left 09/27/2020   Procedure: Left total knee arthroplasty revision;  Surgeon: Samson Frederic, MD;  Location: [facility];  Service: Orthopedics;  Laterality: Left;    ROS: Review of Systems Negative except as stated above  PHYSICAL EXAM: BP (!) 140/82   Pulse 87   Temp 98.1 F (36.7 C) (Oral)   Ht 5\' 11"  (1.803 m)   Wt 259 lb (117.5 kg)   SpO2 97%   BMI 36.12 kg/m   Wt Readings from Last 3 Encounters:  05/13/22 259 lb (117.5 kg)  02/11/22 261 lb (118.4 kg)  11/23/21 255 lb 9.6 oz (115.9 kg)    Physical Exam   General appearance - alert, well appearing, older Caucasian male and in no distress Mental status - normal mood, behavior, speech, dress, motor activity, and thought processes Neck - supple, no significant adenopathy Chest - clear to auscultation, no wheezes, rales or rhonchi, symmetric air entry Heart - normal rate, regular rhythm, normal S1, S2, no murmurs, rubs, clicks or gallops Breast: He has mild bilateral gynecomastia Extremities -trace edema of the lower extremities.  Dorsalis pedis, posterior tibialis and popliteal pulses are 3+ bilaterally     Latest Ref Rng & Units 02/11/2022    2:57 PM 09/28/2020    3:18 AM 09/22/2020    8:49 AM  CMP  Glucose 70 - 99 mg/dL 81  09/24/2020  90   BUN 8 -  27 mg/dL 15  18  17    Creatinine 0.76 - 1.27 mg/dL 378   5.88   Sodium 134 - 144 mmol/L 137  138  141   Potassium 3.5 - 5.2 mmol/L 4.4  4.6  3.8   Chloride 96 - 106 mmol/L 97  104  104   CO2 20 - 29 mmol/L 24  25  25    Calcium 8.6 - 10.2 mg/dL 5.02  9.0  9.4   Total Protein 6.0 - 8.5 g/dL 7.0   7.2   Total Bilirubin 0.0 - 1.2 mg/dL 7.74   0.2   Alkaline Phos 44 - 121 IU/L 89   77   AST 0 - 40 IU/L 17   21   ALT 0 - 44 IU/L 18   22    Lipid Panel     Component Value Date/Time   CHOL 197 03/30/2020 1456   TRIG 272 (H) 03/30/2020 1456   HDL 52 03/30/2020 1456   CHOLHDL 3.8 03/30/2020 1456   LDLCALC 99 03/30/2020 1456    CBC    Component Value Date/Time   WBC 8.0 02/11/2022 1457   WBC 11.5 (H) 09/28/2020 0318   RBC 4.66 02/11/2022 1457   RBC 3.85 (L) 09/28/2020 0318   HGB 14.0 02/11/2022 1457   HCT 41.0 02/11/2022 1457   PLT 328 02/11/2022 1457   MCV 88 02/11/2022 1457   MCH 30.0 02/11/2022 1457   MCH 29.4 09/28/2020 0318   MCHC 34.1 02/11/2022 1457   MCHC 32.9 09/28/2020 0318   RDW 12.9 02/11/2022 1457   LYMPHSABS 2.6 02/11/2022 1457   MONOABS 0.7 03/02/2019 0927   EOSABS 0.2 02/11/2022 1457   BASOSABS 0.1 02/11/2022 1457    ASSESSMENT AND PLAN: 1. Essential hypertension Not at goal. Increase hydralazine to 20 mg twice a day.  Continue Norvasc 10 mg daily and lisinopril/HCTZ 20/25 mg 1 tablet daily - hydrALAZINE (APRESOLINE) 10 MG tablet; Take 2 tablets (20 mg total) by mouth 2 (two) times daily.  Dispense: 360 tablet; Refill: 1  2. Tobacco dependence Patient has done well with his smoking cessation but has episodes where he smokes occasionally when around others or when he drinks alcoholic beverages.  Advised to try to avoid the circumstances is much as possible to prevent himself from slipping back into becoming an every day smoker again.  3. Obesity (BMI 35.0-39.9 without comorbidity) Commended him on changes that he is made in his eating habits so far.   Encouraged him to eliminate sugary drinks from the diet.  Continue regular exercise with goal of getting in about 150 minutes/week total of moderate intensity exercise.  4. Right foot pain Encouraged him to get the x-ray done to see if he has some arthritis of the midfoot.  5. Patient on methadone maintenance therapy [facility]) Printed information given to him for Suboxone program in the area.  6. Influenza vaccination declined Recommended.  Patient declined.  7. Lung cancer screening declined by patient Does qualify for screening but patient declines to be screened.     Patient was given the opportunity to ask questions.  Patient verbalized understanding of the plan and was able to repeat key elements of the plan.   This documentation was completed using Paediatric nurse.  Any transcriptional errors are unintentional.  No orders of the defined types were placed in this encounter.    Requested Prescriptions   Signed Prescriptions Disp Refills   hydrALAZINE (APRESOLINE) 10 MG tablet 360 tablet 1    Sig: Take 2 tablets (20 mg total) by mouth 2 (two) times daily.    Return in about 4 months (around 09/12/2022).  Jonah Blue, MD, FACP

## 2022-05-23 ENCOUNTER — Ambulatory Visit
Admission: RE | Admit: 2022-05-23 | Discharge: 2022-05-23 | Disposition: A | Discharge status: Home / Self Care | Payer: PPO | Source: Ambulatory Visit | Attending: Nurse Practitioner | Admitting: Nurse Practitioner

## 2022-05-23 DIAGNOSIS — M79671 Pain in right foot: Secondary | ICD-10-CM | POA: Diagnosis not present

## 2022-05-28 ENCOUNTER — Other Ambulatory Visit: Payer: Self-pay | Admitting: Nurse Practitioner

## 2022-05-28 DIAGNOSIS — M79671 Pain in right foot: Secondary | ICD-10-CM

## 2022-05-28 DIAGNOSIS — M7731 Calcaneal spur, right foot: Secondary | ICD-10-CM

## 2022-06-06 ENCOUNTER — Encounter: Payer: Medicare HMO | Admitting: Podiatry

## 2022-06-06 DIAGNOSIS — M7730 Calcaneal spur, unspecified foot: Secondary | ICD-10-CM

## 2022-06-06 NOTE — Progress Notes (Signed)
Had to reschedule This encounter was created in error - please disregard. 

## 2022-06-13 ENCOUNTER — Ambulatory Visit: Payer: Medicare HMO | Admitting: Podiatry

## 2022-06-13 ENCOUNTER — Encounter: Payer: Self-pay | Admitting: Podiatry

## 2022-06-13 VITALS — BP 161/83 | HR 90

## 2022-06-13 DIAGNOSIS — M778 Other enthesopathies, not elsewhere classified: Secondary | ICD-10-CM

## 2022-06-13 MED ORDER — TRIAMCINOLONE ACETONIDE 40 MG/ML IJ SUSP
20.0000 mg | Freq: Once | INTRAMUSCULAR | Status: AC
  Administered 2022-06-13: 20 mg

## 2022-06-13 NOTE — Progress Notes (Signed)
Subjective:  Patient ID: Michael [name], male    DOB: [birth year],  MRN: [id number] HPI Chief Complaint  Patient presents with   Foot Pain    Dorsal forefoot and midfoot right - aching x couple years, had knee replacements and noticed worsening since then, pain in the mornings and end of day after on it awhile, PCP xrayed- said had midfoot arthritis, does calf raises and stretches throughout the day   New Patient (Initial Visit)    62 y.o. male presents with the above complaint.   ROS: Denies fever chills nausea vomit muscle aches pains calf pain back pain chest pain shortness of breath.  History of total knee replacement x 4  Past Medical History:  Diagnosis Date   Arthritis    Hepatitis    Hep C rx took treatment.   Hypertension    Pre-diabetes    Past Surgical History:  Procedure Laterality Date   COLONOSCOPY     left knee arthroscopy      left rotator cuff surgery      REVISION TOTAL KNEE ARTHROPLASTY Left 06/15/2018   SHOULDER OPEN ROTATOR CUFF REPAIR Right 01/29/2013   Procedure: RIGHT SHOULDER MINI ROTATOR CUFF REPAIR;  Surgeon: Johnn Hai, MD;  Location: [facility];  Service: Orthopedics;  Laterality: Right;  With ANCHORS   TOTAL HIP ARTHROPLASTY Left 06/08/2020   Procedure: TOTAL HIP ARTHROPLASTY ANTERIOR APPROACH;  Surgeon: Rod Can, MD;  Location: [facility];  Service: Orthopedics;  Laterality: Left;   TOTAL KNEE ARTHROPLASTY Left 02/12/2017   TOTAL KNEE ARTHROPLASTY Left 02/12/2017   Procedure: LEFT TOTAL KNEE ARTHROPLASTY;  Surgeon: Leandrew Koyanagi, MD;  Location: [facility];  Service: Orthopedics;  Laterality: Left;   TOTAL KNEE REVISION Left 06/15/2018   Procedure: LEFT TOTAL KNEE REVISION;  Surgeon: Leandrew Koyanagi, MD;  Location: [facility];  Service: Orthopedics;  Laterality: Left;   TOTAL KNEE REVISION Left 03/08/2019   Procedure: LEFT TOTAL KNEE REVISION;  Surgeon: Leandrew Koyanagi, MD;  Location: [facility];  Service: Orthopedics;  Laterality: Left;   TOTAL KNEE REVISION  Left 09/27/2020   Procedure: Left total knee arthroplasty revision;  Surgeon: Rod Can, MD;  Location: [facility];  Service: Orthopedics;  Laterality: Left;    Current Outpatient Medications:    amLODipine (NORVASC) 10 MG tablet, Take 1 tablet (10 mg total) by mouth daily., Disp: 90 tablet, Rfl: 1   atorvastatin (LIPITOR) 20 MG tablet, Take 1 tablet (20 mg total) by mouth daily., Disp: 30 tablet, Rfl: 4   hydrALAZINE (APRESOLINE) 10 MG tablet, Take 2 tablets (20 mg total) by mouth 2 (two) times daily., Disp: 360 tablet, Rfl: 1   lisinopril-hydrochlorothiazide (ZESTORETIC) 20-25 MG tablet, TAKE 1 TABLET BY MOUTH EVERY DAY, Disp: 90 tablet, Rfl: 0   methadone (DOLOPHINE) 10 MG tablet, Take 3 tablets (30 mg total) by mouth every 8 (eight) hours. (Patient taking differently: Take 60 mg by mouth daily.), Disp: 30 tablet, Rfl: 0  No Known Allergies Review of Systems Objective:   Vitals:   06/13/22 0938  BP: (!) 161/83  Pulse: 90    General: Well developed, nourished, in no acute distress, alert and oriented x3   Dermatological: Skin is warm, dry and supple bilateral. Nails x 10 are well maintained; remaining integument appears unremarkable at this time. There are no open sores, no preulcerative lesions, no rash or signs of infection present.  Vascular: Dorsalis Pedis artery and Posterior Tibial artery pedal pulses are 2/4 bilateral with immedate capillary  fill time. Pedal hair growth present. No varicosities and no lower extremity edema present bilateral.   Neruologic: Grossly intact via light touch bilateral. Vibratory intact via tuning fork bilateral. Protective threshold with Semmes Wienstein monofilament intact to all pedal sites bilateral. Patellar and Achilles deep tendon reflexes 2+ bilateral. No Babinski or clonus noted bilateral.   Musculoskeletal: No gross boney pedal deformities bilateral. No pain, crepitus, or limitation noted with foot and ankle range of motion bilateral.  Muscular strength 5/5 in all groups tested bilateral.  Gait: Unassisted, Nonantalgic.    Radiographs:  Radiographs reviewed today previously taken at physicians office right foot demonstrating osteoarthritis first metatarsophalangeal joint and significant osteoarthritic changes to the tarsometatarsal joints as well as the talonavicular joint.    Assessment & Plan:   Assessment: Osteoarthritis midfoot right  Plan: 20 mg injection of Kenalog plus the dorsal aspect of the right foot at the tarsometatarsal joints.  Local anesthetic 5 mg mixed.  He will follow-up with [place] on an as-needed basis     Mavi Un T. Baldwin, [place]

## 2022-06-26 ENCOUNTER — Other Ambulatory Visit: Payer: Self-pay

## 2022-06-26 ENCOUNTER — Emergency Department (HOSPITAL_COMMUNITY)
Admission: EM | Admit: 2022-06-26 | Discharge: 2022-06-26 | Disposition: A | Discharge status: Home / Self Care | Payer: Medicare HMO | Attending: Emergency Medicine | Admitting: Emergency Medicine

## 2022-06-26 ENCOUNTER — Emergency Department (HOSPITAL_COMMUNITY): Payer: Medicare HMO

## 2022-06-26 ENCOUNTER — Encounter: Payer: Self-pay | Admitting: Internal Medicine

## 2022-06-26 DIAGNOSIS — Z79899 Other long term (current) drug therapy: Secondary | ICD-10-CM | POA: Insufficient documentation

## 2022-06-26 DIAGNOSIS — I10 Essential (primary) hypertension: Secondary | ICD-10-CM | POA: Diagnosis not present

## 2022-06-26 DIAGNOSIS — U071 COVID-19: Secondary | ICD-10-CM | POA: Insufficient documentation

## 2022-06-26 DIAGNOSIS — R059 Cough, unspecified: Secondary | ICD-10-CM | POA: Diagnosis present

## 2022-06-26 LAB — RESP PANEL BY RT-PCR (RSV, FLU A&B, COVID)  RVPGX2
Influenza A by PCR: NEGATIVE
Influenza B by PCR: NEGATIVE
Resp Syncytial Virus by PCR: NEGATIVE
SARS Coronavirus 2 by RT PCR: POSITIVE — AB

## 2022-06-26 MED ORDER — ALBUTEROL SULFATE HFA 108 (90 BASE) MCG/ACT IN AERS
2.0000 | INHALATION_SPRAY | Freq: Once | RESPIRATORY_TRACT | Status: AC
  Administered 2022-06-26: 2 via RESPIRATORY_TRACT
  Filled 2022-06-26: qty 6.7

## 2022-06-26 MED ORDER — PAXLOVID (300/100) 20 X 150 MG & 10 X 100MG PO TBPK: 3.0000 | ORAL_TABLET | Freq: Two times a day (BID) | ORAL | 0 refills | Status: AC

## 2022-06-26 MED ORDER — ALBUTEROL SULFATE HFA 108 (90 BASE) MCG/ACT IN AERS: 2.0000 | INHALATION_SPRAY | RESPIRATORY_TRACT | Status: DC | PRN

## 2022-06-26 NOTE — ED Notes (Signed)
Pt agrees with discharge, denies pain or discomfort, prescription reviewed, and follow up care also reviewed.

## 2022-06-26 NOTE — ED Provider Notes (Signed)
[facility] Provider Note   CSN: [id number] Arrival date & time: 06/26/22  1324     History  Chief Complaint  Patient presents with   Covid+     Michael [name] is a 62 y.o. male.  Patient is a 62 year old male with a history of hypertension on 4 different antihypertensives who is presenting today with complaints of 18 hours of cough, fever, congestion and reports overnight he started feeling short of breath.  He states yesterday during the day he felt fine at all seem to hit him last night.  He took a home COVID test that did come back positive however throughout the night he felt more short of breath and he did not know that he could wait to go to urgent care.  He denies a prior history of lung disease but has been a former smoker.  He reports now the shortness of breath has improved but he still feels some mild tightness.  He does not use inhalers at home.  He is otherwise compliant with his medications.  He has no nausea vomiting, abdominal pain or chest pain.  The history is provided by the patient.       Home Medications Prior to Admission medications   Medication Sig Start Date End Date Taking? Authorizing Provider  nirmatrelvir & ritonavir (PAXLOVID, 300/100,) 20 x 150 MG & 10 x 100MG  TBPK Take 3 tablets by mouth 2 (two) times daily for 5 days. 06/26/22 07/01/22 Yes Borna Wessinger, Loree Fee, MD  amLODipine (NORVASC) 10 MG tablet Take 1 tablet (10 mg total) by mouth daily. 02/06/22   Ladell Pier, MD  atorvastatin (LIPITOR) 20 MG tablet Take 1 tablet (20 mg total) by mouth daily. 01/02/22   Ladell Pier, MD  hydrALAZINE (APRESOLINE) 10 MG tablet Take 2 tablets (20 mg total) by mouth 2 (two) times daily. 05/13/22   Ladell Pier, MD  lisinopril-hydrochlorothiazide (ZESTORETIC) 20-25 MG tablet TAKE 1 TABLET BY MOUTH EVERY DAY 04/12/22   Ladell Pier, MD  methadone (DOLOPHINE) 10 MG tablet Take 3 tablets (30 mg total) by  mouth every 8 (eight) hours. Patient taking differently: Take 60 mg by mouth daily. 07/05/20   Ladell Pier, MD  carvedilol (COREG) 6.25 MG tablet Take 1 tablet (6.25 mg total) by mouth 2 (two) times daily with a meal. 06/10/19 06/17/19  Ladell Pier, MD      Allergies    Patient has no known allergies.    Review of Systems   Review of Systems  Physical Exam Updated Vital Signs BP 138/77 (BP Location: Right Arm)   Pulse 88   Temp 98.8 F (37.1 C) (Oral)   Resp 19   SpO2 98%  Physical Exam Vitals and nursing note reviewed.  Constitutional:      General: He is not in acute distress.    Appearance: He is well-developed.  HENT:     Head: Normocephalic and atraumatic.  Eyes:     Conjunctiva/sclera: Conjunctivae normal.     Pupils: Pupils are equal, round, and reactive to light.  Cardiovascular:     Rate and Rhythm: Normal rate and regular rhythm.     Heart sounds: No murmur heard. Pulmonary:     Effort: Pulmonary effort is normal. No respiratory distress.     Breath sounds: Wheezing present. No rales.     Comments: Scant wheezing in 3 lung fields Abdominal:     General: There is no distension.  Palpations: Abdomen is soft.     Tenderness: There is no abdominal tenderness. There is no guarding or rebound.  Musculoskeletal:        General: No tenderness. Normal range of motion.     Cervical back: Normal range of motion and neck supple.  Skin:    General: Skin is warm and dry.     Findings: No erythema or rash.  Neurological:     Mental Status: He is alert and oriented to person, place, and time. Mental status is at baseline.  Psychiatric:        Behavior: Behavior normal.     ED Results / Procedures / Treatments   Labs (all labs ordered are listed, but only abnormal results are displayed) Labs Reviewed  RESP PANEL BY RT-PCR (RSV, FLU A&B, COVID)  [id number] - Abnormal; Notable for the following components:      Result Value   SARS Coronavirus 2 by RT PCR  POSITIVE (*)    All other components within normal limits    EKG None  Radiology DG Chest 2 View  Result Date: 06/26/2022 CLINICAL DATA:  Shortness of breath. EXAM: CHEST - 2 VIEW COMPARISON:  04/27/2022 FINDINGS: The lungs are clear without focal pneumonia, edema, pneumothorax or pleural effusion. The cardiopericardial silhouette is within normal limits for size. The visualized bony structures of the thorax are unremarkable. IMPRESSION: No active cardiopulmonary disease. Electronically Signed   By: Misty Stanley M.D.   On: 06/26/2022 06:52    Procedures Procedures    Medications Ordered in ED Medications  albuterol (VENTOLIN HFA) 108 (90 Base) MCG/ACT inhaler 2 puff (has no administration in time range)    ED Course/ Medical Decision Making/ A&P                             Medical Decision Making Amount and/or Complexity of Data Reviewed Labs: ordered. Decision-making details documented in ED Course. Radiology: ordered and independent interpretation performed. Decision-making details documented in ED Course.  Risk Prescription drug management.   Pt with multiple medical problems and comorbidities and presenting today with a complaint that caries a high risk for morbidity and mortality.  Pt with symptoms consistent with viral URI/covid.  Well appearing here.  No signs of breathing difficulty but does have some mild wheezing on exam. No signs of pharyngitis, otitis or abnormal abdominal findings.   I have independently visualized and interpreted pt's images today. CXR wnl and pt to return with any further problems.  COVID is positive.  Patient sats are 97 to 98% on room air.  Will give patient an inhaler as he does have some scant wheezing.  Patient is also in the range for Paxlovid.  Pt's GFR last month was >59.  Discussed return precautions.          Final Clinical Impression(s) / ED Diagnoses Final diagnoses:  COVID    Rx / DC Orders ED Discharge Orders           Ordered    nirmatrelvir & ritonavir (PAXLOVID, 300/100,) 20 x 150 MG & 10 x 100MG  TBPK  2 times daily        06/26/22 0734              Blanchie Dessert, MD 06/26/22 [phone]

## 2022-06-26 NOTE — Discharge Instructions (Addendum)
If you are still taking Lipitor (atorvastatin) you need to hold taking it while you are taking the Paxlovid.  Continue to take all your other blood pressure medications.  Use the inhaler every 4-6 hours as needed if you are having wheezing or chest tightness with shortness of breath.  If the shortness of breath gets significantly worse return to the emergency room.  Also continue to use Tylenol as needed for fever make sure you are getting plenty of rest and staying hydrated.

## 2022-06-26 NOTE — ED Triage Notes (Signed)
Patient tested positive for Covid using home test kit last night with fever/SOB onset this week .

## 2022-07-12 ENCOUNTER — Other Ambulatory Visit: Payer: Self-pay | Admitting: Internal Medicine

## 2022-07-12 DIAGNOSIS — I10 Essential (primary) hypertension: Secondary | ICD-10-CM

## 2022-07-17 ENCOUNTER — Other Ambulatory Visit: Payer: Self-pay | Admitting: Internal Medicine

## 2022-07-17 ENCOUNTER — Encounter: Payer: Self-pay | Admitting: Internal Medicine

## 2022-07-17 DIAGNOSIS — E785 Hyperlipidemia, unspecified: Secondary | ICD-10-CM

## 2022-07-17 DIAGNOSIS — I10 Essential (primary) hypertension: Secondary | ICD-10-CM

## 2022-07-17 MED ORDER — AMLODIPINE BESYLATE 10 MG PO TABS: 10.0000 mg | ORAL_TABLET | Freq: Every day | ORAL | 0 refills | Status: DC

## 2022-07-17 MED ORDER — ATORVASTATIN CALCIUM 20 MG PO TABS: 20.0000 mg | ORAL_TABLET | Freq: Every day | ORAL | 6 refills | Status: DC

## 2022-07-25 ENCOUNTER — Ambulatory Visit
Admission: EM | Admit: 2022-07-25 | Discharge: 2022-07-25 | Disposition: A | Discharge status: Home / Self Care | Payer: Medicare HMO | Attending: Internal Medicine | Admitting: Internal Medicine

## 2022-07-25 DIAGNOSIS — J069 Acute upper respiratory infection, unspecified: Secondary | ICD-10-CM

## 2022-07-25 MED ORDER — CETIRIZINE HCL 10 MG PO TABS: 10.0000 mg | ORAL_TABLET | Freq: Every day | ORAL | 0 refills | Status: DC

## 2022-07-25 MED ORDER — AMOXICILLIN-POT CLAVULANATE 875-125 MG PO TABS: 1.0000 | ORAL_TABLET | Freq: Two times a day (BID) | ORAL | 0 refills | Status: DC

## 2022-07-25 NOTE — ED Provider Notes (Addendum)
[facility]    CSN: [id number] Arrival date & time: 07/25/22  0803      History   Chief Complaint Chief Complaint  Patient presents with   Nasal Congestion    HPI Michael [name] is a 62 y.o. male.   Patient presents with nasal congestion, headache, sinus pressure and pain that started about 1 month ago.  Patient reports that symptoms are present every other day but are consistent.  States that he had COVID on 06/26/2022 and symptoms of nasal congestion never resolved.  Patient was treated with an antibiotic for upper respiratory infection in December prior to covid as well and reports that these symptoms were similar. The respiratory infection in December resolved with antibiotics per patient.   Patient states that "he just needs an antibiotic because it helped last time".  Denies any associated fever or cough.  Denies chest pain or shortness of breath.  His blood pressure is elevated.  He states that he has taken his blood pressure medication today.  Patient states that he checked his blood pressure 3 times this morning and it was [id number] systolic.  Patient does not report chest pain, shortness of breath, dizziness, blurred vision, nausea, vomiting.     Past Medical History:  Diagnosis Date   Arthritis    Hepatitis    Hep C rx took treatment.   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 ([place]) 03/08/2019   Status post revision of total knee replacement, 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   Unilateral primary osteoarthritis, left hip 09/08/2018   S/P revision of total knee, left 06/15/2018   Positive depression screening 05/22/2017   Hepatitis C virus infection cured after antiviral drug therapy 01/19/2017   Hyperlipidemia 10/23/2016   Hypertension 10/18/2016    Substance abuse in remission ([place]) 10/18/2016   Right rotator cuff tear 01/29/2013    Past Surgical History:  Procedure Laterality Date   COLONOSCOPY     left knee arthroscopy      left rotator cuff surgery      REVISION TOTAL KNEE ARTHROPLASTY Left 06/15/2018   SHOULDER OPEN ROTATOR CUFF REPAIR Right 01/29/2013   Procedure: RIGHT SHOULDER MINI ROTATOR CUFF REPAIR;  Surgeon: Johnn Hai, MD;  Location: [facility];  Service: Orthopedics;  Laterality: Right;  With ANCHORS   TOTAL HIP ARTHROPLASTY Left 06/08/2020   Procedure: TOTAL HIP ARTHROPLASTY ANTERIOR APPROACH;  Surgeon: Rod Can, MD;  Location: [facility];  Service: Orthopedics;  Laterality: Left;   TOTAL KNEE ARTHROPLASTY Left 02/12/2017   TOTAL KNEE ARTHROPLASTY Left 02/12/2017   Procedure: LEFT TOTAL KNEE ARTHROPLASTY;  Surgeon: Leandrew Koyanagi, MD;  Location: [place];  Service: Orthopedics;  Laterality: Left;   TOTAL KNEE REVISION Left 06/15/2018   Procedure: LEFT TOTAL KNEE REVISION;  Surgeon: Leandrew Koyanagi, MD;  Location: [place];  Service: Orthopedics;  Laterality: Left;   TOTAL KNEE REVISION Left 03/08/2019   Procedure: LEFT TOTAL KNEE REVISION;  Surgeon: Leandrew Koyanagi, MD;  Location: [place];  Service: Orthopedics;  Laterality: Left;   TOTAL KNEE REVISION Left 09/27/2020   Procedure: Left total knee arthroplasty revision;  Surgeon: Rod Can, MD;  Location: [facility];  Service: Orthopedics;  Laterality: Left;       Home Medications  Prior to Admission medications   Medication Sig Start Date End Date Taking? Authorizing Provider  amoxicillin-clavulanate (AUGMENTIN) 875-125 MG tablet Take 1 tablet by mouth every 12 (twelve) hours. 07/25/22  Yes Tyrel Lex, Michele Rockers, FNP  cetirizine (ZYRTEC) 10 MG tablet Take 1 tablet (10 mg total) by mouth daily. 07/25/22  Yes Shima Compere, Hildred Alamin E, FNP  amLODipine (NORVASC) 10 MG tablet Take 1 tablet (10 mg total) by mouth daily. 07/17/22   Ladell Pier, MD  atorvastatin (LIPITOR) 20 MG tablet TAKE 1  TABLET BY MOUTH EVERY DAY 07/17/22   Ladell Pier, MD  hydrALAZINE (APRESOLINE) 10 MG tablet Take 2 tablets (20 mg total) by mouth 2 (two) times daily. 05/13/22   Ladell Pier, MD  lisinopril-hydrochlorothiazide (ZESTORETIC) 20-25 MG tablet TAKE 1 TABLET BY MOUTH EVERY DAY 07/17/22   Ladell Pier, MD  methadone (DOLOPHINE) 10 MG tablet Take 3 tablets (30 mg total) by mouth every 8 (eight) hours. Patient taking differently: Take 60 mg by mouth daily. 07/05/20   Ladell Pier, MD  carvedilol (COREG) 6.25 MG tablet Take 1 tablet (6.25 mg total) by mouth 2 (two) times daily with a meal. 06/10/19 06/17/19  Ladell Pier, MD    Family History Family History  Problem Relation Age of Onset   CAD Mother    Deep vein thrombosis Brother     Social History Social History   Tobacco Use   Smoking status: Former    Packs/day: 1.00    Years: 37.00    Total pack years: 37.00    Types: Cigarettes    Quit date: 04/20/2020    Years since quitting: 2.2   Smokeless tobacco: Never   Tobacco comments:    Occasionally will smoke one  Vaping Use   Vaping Use: Never used  Substance Use Topics   Alcohol use: Yes    Comment: occational   Drug use: No    Comment: hx of marijuana and cocaine, LSD     Allergies   Patient has no known allergies.   Review of Systems Review of Systems Per HPI  Physical Exam Triage Vital Signs ED Triage Vitals  Enc Vitals Group     BP 07/25/22 0814 (!) 178/94     Pulse Rate 07/25/22 0814 87     Resp 07/25/22 0814 20     Temp 07/25/22 0814 98.1 F (36.7 C)     Temp Source 07/25/22 0814 Oral     SpO2 07/25/22 0814 96 %     Weight --      Height --      Head Circumference --      Peak Flow --      Pain Score 07/25/22 0815 3     Pain Loc --      Pain Edu? --      Excl. in [place]? --    No data found.  Updated Vital Signs BP (!) 159/91 (BP Location: Left Arm)   Pulse 87   Temp 98.1 F (36.7 C) (Oral)   Resp 20   SpO2 96%   Visual  Acuity Right Eye Distance:   Left Eye Distance:   Bilateral Distance:    Right Eye Near:   Left Eye Near:    Bilateral Near:     Physical Exam Constitutional:      General: He is not in acute distress.    Appearance: Normal appearance. He is not toxic-appearing or diaphoretic.  HENT:     Head:  Normocephalic and atraumatic.     Right Ear: Tympanic membrane and ear canal normal.     Left Ear: Tympanic membrane and ear canal normal.     Nose: Congestion present.     Mouth/Throat:     Mouth: Mucous membranes are moist.     Pharynx: No posterior oropharyngeal erythema.  Eyes:     Extraocular Movements: Extraocular movements intact.     Conjunctiva/sclera: Conjunctivae normal.     Pupils: Pupils are equal, round, and reactive to light.  Cardiovascular:     Rate and Rhythm: Normal rate and regular rhythm.     Pulses: Normal pulses.     Heart sounds: Normal heart sounds.  Pulmonary:     Effort: Pulmonary effort is normal. No respiratory distress.     Breath sounds: Normal breath sounds. No stridor. No wheezing, rhonchi or rales.  Abdominal:     General: Abdomen is flat. Bowel sounds are normal.     Palpations: Abdomen is soft.  Musculoskeletal:        General: Normal range of motion.     Cervical back: Normal range of motion.  Skin:    General: Skin is warm and dry.  Neurological:     General: No focal deficit present.     Mental Status: He is alert and oriented to person, place, and time. Mental status is at baseline.  Psychiatric:        Mood and Affect: Mood normal.        Behavior: Behavior normal.      UC Treatments / Results  Labs (all labs ordered are listed, but only abnormal results are displayed) Labs Reviewed - No data to display  EKG   Radiology No results found.  Procedures Procedures (including critical care time)  Medications Ordered in UC Medications - No data to display  Initial Impression / Assessment and Plan / UC Course  I have reviewed  the triage vital signs and the nursing notes.  Pertinent labs & imaging results that were available during my care of the patient were reviewed by me and considered in my medical decision making (see chart for details).     Patient has nasal congestion that has been present for about 1 month since having COVID.  This concerning for secondary bacterial infection following a viral infection.  Therefore, will treat with Augmentin antibiotic.  Discussed with patient the need to see ENT specialty given recurrent upper respiratory infections recently.  Also discussed that allergic component could be a factor so will prescribe daily antihistamine as well.  Advised supportive care and management.  Patient's blood pressure recheck was improved.  Patient states that his blood pressure was normal this morning and he has been taking his medication as prescribed.  He is asymptomatic regarding blood pressure and no signs of endorgan damage, so do not think that emergent evaluation is necessary.  Advised patient to continue monitoring blood pressure and follow-up with urgent care or PCP if it remains elevated.  Discussed return precautions.  Patient verbalized understanding and was agreeable with plan. Final Clinical Impressions(s) / UC Diagnoses   Final diagnoses:  Acute upper respiratory infection     Discharge Instructions      I have prescribed an antibiotic and daily allergy medicine.  Please follow-up with ear, nose, throat specialist for further evaluation.    ED Prescriptions     Medication Sig Dispense Auth. Provider   amoxicillin-clavulanate (AUGMENTIN) 875-125 MG tablet Take 1 tablet by mouth every 12 (  twelve) hours. 14 tablet [place], [place] E, [place]   cetirizine (ZYRTEC) 10 MG tablet Take 1 tablet (10 mg total) by mouth daily. 30 tablet [place], Michele Rockers, [place]      PDMP not reviewed this encounter.   Teodora Medici, [place] 07/25/22 [place], [place], [place] 07/25/22 [phone]

## 2022-07-25 NOTE — Discharge Instructions (Signed)
I have prescribed an antibiotic and daily allergy medicine.  Please follow-up with ear, nose, throat specialist for further evaluation.

## 2022-07-25 NOTE — ED Triage Notes (Signed)
Patient with c/o nasal congestion and a headache. States "I just need some abx, yall gave me some last time".

## 2022-09-06 ENCOUNTER — Ambulatory Visit: Payer: PPO | Admitting: Internal Medicine

## 2022-09-13 ENCOUNTER — Ambulatory Visit: Payer: PPO | Admitting: Internal Medicine

## 2022-10-13 ENCOUNTER — Other Ambulatory Visit: Payer: Self-pay | Admitting: Internal Medicine

## 2022-10-13 DIAGNOSIS — I10 Essential (primary) hypertension: Secondary | ICD-10-CM

## 2022-11-12 ENCOUNTER — Other Ambulatory Visit: Payer: Self-pay | Admitting: Internal Medicine

## 2022-11-12 DIAGNOSIS — I10 Essential (primary) hypertension: Secondary | ICD-10-CM

## 2022-12-17 ENCOUNTER — Ambulatory Visit: Payer: Medicare HMO | Attending: Internal Medicine | Admitting: Internal Medicine

## 2022-12-17 ENCOUNTER — Encounter: Payer: Self-pay | Admitting: Internal Medicine

## 2022-12-17 VITALS — BP 136/88 | HR 87 | Temp 98.5°F | Ht 71.0 in | Wt 272.0 lb

## 2022-12-17 DIAGNOSIS — R7303 Prediabetes: Secondary | ICD-10-CM

## 2022-12-17 DIAGNOSIS — F17210 Nicotine dependence, cigarettes, uncomplicated: Secondary | ICD-10-CM | POA: Diagnosis not present

## 2022-12-17 DIAGNOSIS — F1911 Other psychoactive substance abuse, in remission: Secondary | ICD-10-CM

## 2022-12-17 DIAGNOSIS — I10 Essential (primary) hypertension: Secondary | ICD-10-CM

## 2022-12-17 DIAGNOSIS — R635 Abnormal weight gain: Secondary | ICD-10-CM

## 2022-12-17 DIAGNOSIS — Z6837 Body mass index (BMI) 37.0-37.9, adult: Secondary | ICD-10-CM

## 2022-12-17 DIAGNOSIS — E6609 Other obesity due to excess calories: Secondary | ICD-10-CM

## 2022-12-17 DIAGNOSIS — F17200 Nicotine dependence, unspecified, uncomplicated: Secondary | ICD-10-CM

## 2022-12-17 DIAGNOSIS — E785 Hyperlipidemia, unspecified: Secondary | ICD-10-CM

## 2022-12-17 LAB — GLUCOSE, POCT (MANUAL RESULT ENTRY): POC Glucose: 109 mg/dl — AB (ref 70–99)

## 2022-12-17 LAB — POCT GLYCOSYLATED HEMOGLOBIN (HGB A1C): HbA1c, POC (prediabetic range): 5.7 % (ref 5.7–6.4)

## 2022-12-17 MED ORDER — ATORVASTATIN CALCIUM 20 MG PO TABS: 20.0000 mg | ORAL_TABLET | Freq: Every day | ORAL | 1 refills | Status: AC

## 2022-12-17 MED ORDER — LISINOPRIL-HYDROCHLOROTHIAZIDE 20-25 MG PO TABS: 1.0000 | ORAL_TABLET | Freq: Every day | ORAL | 1 refills | Status: DC

## 2022-12-17 MED ORDER — HYDRALAZINE HCL 25 MG PO TABS: 25.0000 mg | ORAL_TABLET | Freq: Two times a day (BID) | ORAL | 1 refills | Status: DC

## 2022-12-17 MED ORDER — AMLODIPINE BESYLATE 10 MG PO TABS: 10.0000 mg | ORAL_TABLET | Freq: Every day | ORAL | 1 refills | Status: AC

## 2022-12-17 NOTE — Progress Notes (Signed)
Patient ID: ELAN BRAINERD, male    DOB: 11/19/1960  MRN: [id number]  CC: Follow-up (Follow-up. /Concerns about unable to achieve weight loss. Herold Harms blood work)   Subjective: Michael [name] is a 62 y.o. male who presents for chronic disease management His concerns today include:  Pt with hx of HTN, HL, preDM, obesity, OA LT hip and lumbar spine, Hep C treated with interferon/rib combo in [place] 2005, tobacco abuse, substance use disorder was  on Methadone.    HYPERTENSION Currently taking: see medication list.  He is on Norvasc 10 mg daily, lisinopril/hydrochlorothiazide 20/25 mg 1 tablet daily and hydralazine 10 mg one tablet twice a day. Took meds already for today Med Adherence: [x]  Yes    []  No Medication side effects: []  Yes    [x]  No Adherence with salt restriction: [x]  Yes   Home Monitoring?: [x]  Yes  every other day Last check last evning.  Was 126/83. Limits salt in foods No CP/SOB/  PreDM/Obesity: Results for orders placed or performed in visit on 12/17/22  POCT glucose (manual entry)  Result Value Ref Range   POC Glucose 109 (A) 70 - 99 mg/dl  POCT glycosylated hemoglobin (Hb A1C)  Result Value Ref Range   Hemoglobin A1C     HbA1c POC (<> result, manual entry)     HbA1c, POC (prediabetic range) 5.7 5.7 - 6.4 %   HbA1c, POC (controlled diabetic range)    He is trying to lose weight.  Weight is up 13 pounds since I last saw him 04/2022. -Eating habits: thinks he is doing well with eating habits.  Trying to stay away from fried and process foods, hardly drinks ETOH any more, not eating much bread and has cut back on sodas.  Eating a more veggies.  Thinks his portions are ok.  Never saw nutritionist.   -Exercise: going to gym 3-4 x a wk - TM and exercise bike -feels hot intermittently at times.  No feeling of being cold all the time  HL: Compliant with atorvastatin.  Needs refill.  Tobacco dependence: On last visit, he reports he had quit for 1 year but had some  slip ups over the Christmas holiday. Reports today that he will have a cigarette when he drinks but has not drink in last few mths.    SUD: On last visit, he expressed desire to get into a Suboxone program and get off Methadone.  Weaned himself off  5 mths ago.  Did get on Suboxone.  Stayed on it for about 1 mth and did not like it so weaned self off that as well.  "Reports good days and bad days." No desire to use any more.. 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   Status post revision of total knee replacement, 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   Unilateral primary osteoarthritis, left hip 09/08/2018   S/P revision of total knee, left 06/15/2018   Positive depression screening 05/22/2017   Hepatitis C virus infection cured after antiviral drug therapy 01/19/2017   Hyperlipidemia 10/23/2016   Hypertension 10/18/2016   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   Right rotator cuff tear 01/29/2013     Current Outpatient Medications on File Prior to Visit  Medication Sig Dispense Refill   [DISCONTINUED] carvedilol (COREG) 6.25 MG tablet Take 1 tablet (6.25 mg total) by mouth 2 (two) times daily  with a meal. 60 tablet 3   No current facility-administered medications on file prior to visit.    No Known Allergies  Social History   Socioeconomic History   Marital status: Single    Spouse name: Not on file   Number of children: 0   Years of education: 12   Highest education level: GED or equivalent  Occupational History   Occupation: Chartered certified accountant  Tobacco Use   Smoking status: Former    Current packs/day: 0.00    Average packs/day: 1 pack/day for 37.0 years (37.0 ttl pk-yrs)    Types: Cigarettes    Start date: 04/21/1983    Quit date: 04/20/2020    Years since quitting: 2.6   Smokeless tobacco: Never   Tobacco comments:    Occasionally will  smoke one  Vaping Use   Vaping status: Never Used  Substance and Sexual Activity   Alcohol use: Yes    Comment: occational   Drug use: No    Comment: hx of marijuana and cocaine, LSD   Sexual activity: Yes    Partners: Female    Birth control/protection: None  Other Topics Concern   Not on file  Social History Narrative   Not on file   Social Determinants of Health   Financial Resource Strain: Medium Risk (12/16/2022)   Overall Financial Resource Strain (CARDIA)    Difficulty of Paying Living Expenses: Somewhat hard  Food Insecurity: Food Insecurity Present (12/16/2022)   Hunger Vital Sign    Worried About Running Out of Food in the Last Year: Sometimes true    Ran Out of Food in the Last Year: Sometimes true  Transportation Needs: No Transportation Needs (12/16/2022)   PRAPARE - Administrator, Civil Service (Medical): No    Lack of Transportation (Non-Medical): No  Physical Activity: Sufficiently Active (12/16/2022)   Exercise Vital Sign    Days of Exercise per Week: 4 days    Minutes of Exercise per Session: 60 min  Stress: No Stress Concern Present (12/16/2022)   [employer] of Occupational Health - Occupational Stress Questionnaire    Feeling of Stress : Only a little  Social Connections: Moderately Isolated (12/16/2022)   Social Connection and Isolation Panel [NHANES]    Frequency of Communication with Friends and Family: More than three times a week    Frequency of Social Gatherings with Friends and Family: Three times a week    Attends Religious Services: 1 to 4 times per year    Active Member of Clubs or Organizations: No    Attends Engineer, structural: Not on file    Marital Status: Never married  Intimate Partner Violence: Not on file    Family History  Problem Relation Age of Onset   CAD Mother    Deep vein thrombosis Brother     Past Surgical History:  Procedure Laterality Date   COLONOSCOPY     left knee arthroscopy      left  rotator cuff surgery      REVISION TOTAL KNEE ARTHROPLASTY Left 06/15/2018   SHOULDER OPEN ROTATOR CUFF REPAIR Right 01/29/2013   Procedure: RIGHT SHOULDER MINI ROTATOR CUFF REPAIR;  Surgeon: Javier Docker, MD;  [facility];  Service: Orthopedics;  Laterality: Right;  With ANCHORS   TOTAL HIP ARTHROPLASTY Left 06/08/2020   Procedure: TOTAL HIP ARTHROPLASTY ANTERIOR APPROACH;  Surgeon: Samson Frederic, MD;  [facility];  Service: Orthopedics;  Laterality: Left;   TOTAL KNEE ARTHROPLASTY Left 02/12/2017  TOTAL KNEE ARTHROPLASTY Left 02/12/2017   Procedure: LEFT TOTAL KNEE ARTHROPLASTY;  Surgeon: Tarry Kos, MD;  [facility];  Service: Orthopedics;  Laterality: Left;   TOTAL KNEE REVISION Left 06/15/2018   Procedure: LEFT TOTAL KNEE REVISION;  Surgeon: Tarry Kos, MD;  [facility];  Service: Orthopedics;  Laterality: Left;   TOTAL KNEE REVISION Left 03/08/2019   Procedure: LEFT TOTAL KNEE REVISION;  Surgeon: Tarry Kos, MD;  [facility];  Service: Orthopedics;  Laterality: Left;   TOTAL KNEE REVISION Left 09/27/2020   Procedure: Left total knee arthroplasty revision;  Surgeon: Samson Frederic, MD;  [facility];  Service: Orthopedics;  Laterality: Left;    ROS: Review of Systems Negative except as stated above  PHYSICAL EXAM: BP 136/88 (BP Location: Left Arm, Patient Position: Sitting, Cuff Size: Normal)   Pulse 87   Temp 98.5 F (36.9 C) (Oral)   Ht 5\' 11"  (1.803 m)   Wt 272 lb (123.4 kg)   SpO2 96%   BMI 37.94 kg/m   Wt Readings from Last 3 Encounters:  12/17/22 272 lb (123.4 kg)  05/13/22 259 lb (117.5 kg)  02/11/22 261 lb (118.4 kg)    Physical Exam General appearance - alert, well appearing, older Caucasian male and in no distress Mental status - normal mood, behavior, speech, dress, motor activity, and thought processes Neck - supple, no significant adenopathy Chest - clear to auscultation, no wheezes, rales or rhonchi, symmetric air  entry Heart - normal rate, regular rhythm, normal S1, S2, no murmurs, rubs, clicks or gallops Extremities - peripheral pulses normal, no pedal edema, no clubbing or cyanosis     Latest Ref Rng & Units 02/11/2022    2:57 PM 09/28/2020    3:18 AM 09/22/2020    8:49 AM  CMP  Glucose 70 - 99 mg/dL 81  161  90   BUN 8 - 27 mg/dL 15  18  17    Creatinine 0.76 - 1.27 mg/dL 0.96  0.45  4.09   Sodium 134 - 144 mmol/L 137  138  141   Potassium 3.5 - 5.2 mmol/L 4.4  4.6  3.8   Chloride 96 - 106 mmol/L 97  104  104   CO2 20 - 29 mmol/L 24  25  25    Calcium 8.6 - 10.2 mg/dL 81.1  9.0  9.4   Total Protein 6.0 - 8.5 g/dL 7.0   7.2   Total Bilirubin 0.0 - 1.2 mg/dL <9.1   0.2   Alkaline Phos 44 - 121 IU/L 89   77   AST 0 - 40 IU/L 17   21   ALT 0 - 44 IU/L 18   22    Lipid Panel     Component Value Date/Time   CHOL 197 03/30/2020 1456   TRIG 272 (H) 03/30/2020 1456   HDL 52 03/30/2020 1456   CHOLHDL 3.8 03/30/2020 1456   LDLCALC 99 03/30/2020 1456    CBC    Component Value Date/Time   WBC 8.0 02/11/2022 1457   WBC 11.5 (H) 09/28/2020 0318   RBC 4.66 02/11/2022 1457   RBC 3.85 (L) 09/28/2020 0318   HGB 14.0 02/11/2022 1457   HCT 41.0 02/11/2022 1457   PLT 328 02/11/2022 1457   MCV 88 02/11/2022 1457   MCH 30.0 02/11/2022 1457   MCH 29.4 09/28/2020 0318   MCHC 34.1 02/11/2022 1457   MCHC 32.9 09/28/2020 0318   RDW 12.9 02/11/2022 1457  LYMPHSABS 2.6 02/11/2022 1457   MONOABS 0.7 03/02/2019 0927   EOSABS 0.2 02/11/2022 1457   BASOSABS 0.1 02/11/2022 1457    ASSESSMENT AND PLAN:  1. Essential hypertension Not at goal not at goal.  Increase hydralazine to 25 mg twice a day.  Continue current dose of Norvasc and lisinopril/HCTZ. - hydrALAZINE (APRESOLINE) 25 MG tablet; Take 1 tablet (25 mg total) by mouth 2 (two) times daily.  Dispense: 180 tablet; Refill: 1 - amLODipine (NORVASC) 10 MG tablet; Take 1 tablet (10 mg total) by mouth daily.  Dispense: 90 tablet; Refill: 1 -  lisinopril-hydrochlorothiazide (ZESTORETIC) 20-25 MG tablet; Take 1 tablet by mouth daily.  Dispense: 90 tablet; Refill: 1 - CBC - Comprehensive metabolic panel  2. Class 2 obesity due to excess calories with body mass index (BMI) of 37.0 to 37.9 in adult, unspecified whether serious comorbidity present Commended him on regular exercise.  Encouraged him to continue to do so. Commended him on trying to eat healthier.  He is agreeable to seeing a nutritionist. We discussed some of the newer medications available for weight loss including Wegovy and Zepbound.  However it does not look like his insurance cover these medications. - Amb ref to Medical Nutrition Therapy-MNT  3. Unexplained weight gain He would like to get his thyroid checked because of the unexplained weight gain despite exercise and dietary changes. - TSH+T4F+T3Free  4. Prediabetes Still in range for prediabetes.  Continue healthy eating habits and regular exercise. - POCT glucose (manual entry) - POCT glycosylated hemoglobin (Hb A1C)  5. Tobacco use disorder Strongly encouraged him to remain free of cigarettes completely to avoid slipping back into smoking daily.  6. Hyperlipidemia, unspecified hyperlipidemia type - atorvastatin (LIPITOR) 20 MG tablet; Take 1 tablet (20 mg total) by mouth daily.  Dispense: 90 tablet; Refill: 1 - Lipid panel    Patient was given the opportunity to ask questions.  Patient verbalized understanding of the plan and was able to repeat key elements of the plan.   This documentation was completed using Paediatric nurse.  Any transcriptional errors are unintentional.  Orders Placed This Encounter  Procedures   TSH+T4F+T3Free   CBC   Comprehensive metabolic panel   Lipid panel   Amb ref to Medical Nutrition Therapy-MNT   POCT glucose (manual entry)   POCT glycosylated hemoglobin (Hb A1C)     Requested Prescriptions   Signed Prescriptions Disp Refills   hydrALAZINE  (APRESOLINE) 25 MG tablet 180 tablet 1    Sig: Take 1 tablet (25 mg total) by mouth 2 (two) times daily.   amLODipine (NORVASC) 10 MG tablet 90 tablet 1    Sig: Take 1 tablet (10 mg total) by mouth daily.   atorvastatin (LIPITOR) 20 MG tablet 90 tablet 1    Sig: Take 1 tablet (20 mg total) by mouth daily.   lisinopril-hydrochlorothiazide (ZESTORETIC) 20-25 MG tablet 90 tablet 1    Sig: Take 1 tablet by mouth daily.    Return in about 4 months (around 04/19/2023) for AWV give telephone appt with CMA in 2 wks for this.Jonah Blue, MD, FACP

## 2022-12-18 LAB — TSH+T4F+T3FREE
Free T4: 1.17 ng/dL (ref 0.82–1.77)
T3, Free: 3.8 pg/mL (ref 2.0–4.4)
TSH: 2.56 u[IU]/mL (ref 0.450–4.500)

## 2022-12-19 ENCOUNTER — Ambulatory Visit: Payer: Medicare HMO | Attending: Internal Medicine

## 2022-12-19 ENCOUNTER — Other Ambulatory Visit: Payer: Self-pay | Admitting: Internal Medicine

## 2022-12-19 DIAGNOSIS — E782 Mixed hyperlipidemia: Secondary | ICD-10-CM

## 2022-12-19 DIAGNOSIS — I10 Essential (primary) hypertension: Secondary | ICD-10-CM

## 2022-12-20 LAB — LIPID PANEL: LDL Chol Calc (NIH): 79 mg/dL (ref 0–99)

## 2023-01-03 ENCOUNTER — Ambulatory Visit: Payer: Medicare HMO

## 2023-01-03 VITALS — Ht 71.0 in | Wt 272.0 lb

## 2023-01-03 DIAGNOSIS — Z0000 Encounter for general adult medical examination without abnormal findings: Secondary | ICD-10-CM | POA: Diagnosis not present

## 2023-01-03 NOTE — Patient Instructions (Addendum)
Mr. Michael [name] , Thank you for taking time to come for your Medicare Wellness Visit. I appreciate your ongoing commitment to your health goals. Please review the following plan we discussed and let me know if I can assist you in the future.   Referrals/Orders/Follow-Ups/Clinician Recommendations:   This is a list of the screening recommended for you and due dates:  Health Maintenance  Topic Date Due   Flu Shot  12/26/2022   COVID-19 Vaccine (5 - 2023-24 season) 02/25/2023*   Screening for Lung Cancer  05/14/2023*   Medicare Annual Wellness Visit  01/03/2024   Colon Cancer Screening  05/27/2024   DTaP/Tdap/Td vaccine (2 - Td or Tdap) 10/19/2026   Hepatitis C Screening  Completed   HIV Screening  Completed   Zoster (Shingles) Vaccine  Completed   HPV Vaccine  Aged Out  *Topic was postponed. The date shown is not the original due date.    Advanced directives: (Declined) Advance directive discussed with you today. Even though you declined this today, please call our office should you change your mind, and we can give you the proper paperwork for you to fill out.  Next Medicare Annual Wellness Visit scheduled for next year: Yes  Preventive Care 40-64 Years, Male Preventive care refers to lifestyle choices and visits with your health care provider that can promote health and wellness. What does preventive care include? A yearly physical exam. This is also called an annual well check. Dental exams once or twice a year. Routine eye exams. Ask your health care provider how often you should have your eyes checked. Personal lifestyle choices, including: Daily care of your teeth and gums. Regular physical activity. Eating a healthy diet. Avoiding tobacco and drug use. Limiting alcohol use. Practicing safe sex. Taking low-dose aspirin every day starting at age 68. What happens during an annual well check? The services and screenings done by your health care provider during your annual well  check will depend on your age, overall health, lifestyle risk factors, and family history of disease. Counseling  Your health care provider may ask you questions about your: Alcohol use. Tobacco use. Drug use. Emotional well-being. Home and relationship well-being. Sexual activity. Eating habits. Work and work Astronomer. Screening  You may have the following tests or measurements: Height, weight, and BMI. Blood pressure. Lipid and cholesterol levels. These may be checked every 5 years, or more frequently if you are over 57 years old. Skin check. Lung cancer screening. You may have this screening every year starting at age 46 if you have a 30-pack-year history of smoking and currently smoke or have quit within the past 15 years. Fecal occult blood test (FOBT) of the stool. You may have this test every year starting at age 32. Flexible sigmoidoscopy or colonoscopy. You may have a sigmoidoscopy every 5 years or a colonoscopy every 10 years starting at age 34. Prostate cancer screening. Recommendations will vary depending on your family history and other risks. Hepatitis C blood test. Hepatitis B blood test. Sexually transmitted disease (STD) testing. Diabetes screening. This is done by checking your blood sugar (glucose) after you have not eaten for a while (fasting). You may have this done every 1-3 years. Discuss your test results, treatment options, and if necessary, the need for more tests with your health care provider. Vaccines  Your health care provider may recommend certain vaccines, such as: Influenza vaccine. This is recommended every year. Tetanus, diphtheria, and acellular pertussis (Tdap, Td) vaccine. You may need a Td  booster every 10 years. Zoster vaccine. You may need this after age 58. Pneumococcal 13-valent conjugate (PCV13) vaccine. You may need this if you have certain conditions and have not been vaccinated. Pneumococcal polysaccharide (PPSV23) vaccine. You may  need one or two doses if you smoke cigarettes or if you have certain conditions. Talk to your health care provider about which screenings and vaccines you need and how often you need them. This information is not intended to replace advice given to you by your health care provider. Make sure you discuss any questions you have with your health care provider. Document Released: 06/09/2015 Document Revised: 01/31/2016 Document Reviewed: 03/14/2015 Elsevier Interactive Patient Education  2017 [employer].  Fall Prevention in the Home Falls can cause injuries. They can happen to people of all ages. There are many things you can do to make your home safe and to help prevent falls. What can I do on the outside of my home? Regularly fix the edges of walkways and driveways and fix any cracks. Remove anything that might make you trip as you walk through a door, such as a raised step or threshold. Trim any bushes or trees on the path to your home. Use bright outdoor lighting. Clear any walking paths of anything that might make someone trip, such as rocks or tools. Regularly check to see if handrails are loose or broken. Make sure that both sides of any steps have handrails. Any raised decks and porches should have guardrails on the edges. Have any leaves, snow, or ice cleared regularly. Use sand or salt on walking paths during winter. Clean up any spills in your garage right away. This includes oil or grease spills. What can I do in the bathroom? Use night lights. Install grab bars by the toilet and in the tub and shower. Do not use towel bars as grab bars. Use non-skid mats or decals in the tub or shower. If you need to sit down in the shower, use a plastic, non-slip stool. Keep the floor dry. Clean up any water that spills on the floor as soon as it happens. Remove soap buildup in the tub or shower regularly. Attach bath mats securely with double-sided non-slip rug tape. Do not have throw rugs  and other things on the floor that can make you trip. What can I do in the bedroom? Use night lights. Make sure that you have a light by your bed that is easy to reach. Do not use any sheets or blankets that are too big for your bed. They should not hang down onto the floor. Have a firm chair that has side arms. You can use this for support while you get dressed. Do not have throw rugs and other things on the floor that can make you trip. What can I do in the kitchen? Clean up any spills right away. Avoid walking on wet floors. Keep items that you use a lot in easy-to-reach places. If you need to reach something above you, use a strong step stool that has a grab bar. Keep electrical cords out of the way. Do not use floor polish or wax that makes floors slippery. If you must use wax, use non-skid floor wax. Do not have throw rugs and other things on the floor that can make you trip. What can I do with my stairs? Do not leave any items on the stairs. Make sure that there are handrails on both sides of the stairs and use them. Fix handrails that  are broken or loose. Make sure that handrails are as long as the stairways. Check any carpeting to make sure that it is firmly attached to the stairs. Fix any carpet that is loose or worn. Avoid having throw rugs at the top or bottom of the stairs. If you do have throw rugs, attach them to the floor with carpet tape. Make sure that you have a light switch at the top of the stairs and the bottom of the stairs. If you do not have them, ask someone to add them for you. What else can I do to help prevent falls? Wear shoes that: Do not have high heels. Have rubber bottoms. Are comfortable and fit you well. Are closed at the toe. Do not wear sandals. If you use a stepladder: Make sure that it is fully opened. Do not climb a closed stepladder. Make sure that both sides of the stepladder are locked into place. Ask someone to hold it for you, if  possible. Clearly mark and make sure that you can see: Any grab bars or handrails. First and last steps. Where the edge of each step is. Use tools that help you move around (mobility aids) if they are needed. These include: Canes. Walkers. Scooters. Crutches. Turn on the lights when you go into a dark area. Replace any light bulbs as soon as they burn out. Set up your furniture so you have a clear path. Avoid moving your furniture around. If any of your floors are uneven, fix them. If there are any pets around you, be aware of where they are. Review your medicines with your doctor. Some medicines can make you feel dizzy. This can increase your chance of falling. Ask your doctor what other things that you can do to help prevent falls. This information is not intended to replace advice given to you by your health care provider. Make sure you discuss any questions you have with your health care provider. Document Released: 03/09/2009 Document Revised: 10/19/2015 Document Reviewed: 06/17/2014 Elsevier Interactive Patient Education  2017 [employer].

## 2023-01-03 NOTE — Progress Notes (Signed)
Subjective:   Michael [name] is a 62 y.o. male who presents for Medicare Annual/Subsequent preventive examination.  Visit Complete: Virtual  I connected with  Michael [name] on 01/09/23 by a audio enabled telemedicine application and verified that I am speaking with the correct person using two identifiers.  Patient Location: Home  Provider Location: Home Office  I discussed the limitations of evaluation and management by telemedicine. The patient expressed understanding and agreed to proceed.  Patient Medicare AWV questionnaire was completed by the patient on 01/02/23; I have confirmed that all information answered by patient is correct and no changes since this date.  Review of Systems    Vital Signs: Unable to obtain new vitals due to this being a telehealth visit.  Cardiac Risk Factors include: advanced age (>86men, >10 women);male gender;hypertension     Objective:    Today's Vitals   01/03/23 0926  Weight: 272 lb (123.4 kg)  Height: 5\' 11"  (1.803 m)   Body mass index is 37.94 kg/m.     01/03/2023    9:34 AM 06/26/2022    6:32 AM 11/23/2021   10:31 AM 09/27/2020    6:00 PM 09/22/2020    8:34 AM 06/01/2020    8:13 AM 05/15/2020    8:10 AM  Advanced Directives  Does Patient Have a Medical Advance Directive? No No No No No No No  Would patient like information on creating a medical advance directive? No - Patient declined  No - Patient declined No - Patient declined No - Patient declined No - Patient declined     Current Medications (verified) Outpatient Encounter Medications as of 01/03/2023  Medication Sig   amLODipine (NORVASC) 10 MG tablet Take 1 tablet (10 mg total) by mouth daily.   atorvastatin (LIPITOR) 20 MG tablet Take 1 tablet (20 mg total) by mouth daily.   hydrALAZINE (APRESOLINE) 25 MG tablet Take 1 tablet (25 mg total) by mouth 2 (two) times daily.   lisinopril-hydrochlorothiazide (ZESTORETIC) 20-25 MG tablet Take 1 tablet by mouth daily.    [DISCONTINUED] carvedilol (COREG) 6.25 MG tablet Take 1 tablet (6.25 mg total) by mouth 2 (two) times daily with a meal.   No facility-administered encounter medications on file as of 01/03/2023.    Allergies (verified) Patient has no known allergies.   History: Past Medical History:  Diagnosis Date   Arthritis    Hepatitis    Hep C rx took treatment.   Hypertension    Pre-diabetes    Past Surgical History:  Procedure Laterality Date   COLONOSCOPY     left knee arthroscopy      left rotator cuff surgery      REVISION TOTAL KNEE ARTHROPLASTY Left 06/15/2018   SHOULDER OPEN ROTATOR CUFF REPAIR Right 01/29/2013   Procedure: RIGHT SHOULDER MINI ROTATOR CUFF REPAIR;  Surgeon: Javier Docker, MD;  Location: [facility];  Service: Orthopedics;  Laterality: Right;  With ANCHORS   TOTAL HIP ARTHROPLASTY Left 06/08/2020   Procedure: TOTAL HIP ARTHROPLASTY ANTERIOR APPROACH;  Surgeon: Samson Frederic, MD;  Location: [facility];  Service: Orthopedics;  Laterality: Left;   TOTAL KNEE ARTHROPLASTY Left 02/12/2017   TOTAL KNEE ARTHROPLASTY Left 02/12/2017   Procedure: LEFT TOTAL KNEE ARTHROPLASTY;  Surgeon: Tarry Kos, MD;  Location: [facility];  Service: Orthopedics;  Laterality: Left;   TOTAL KNEE REVISION Left 06/15/2018   Procedure: LEFT TOTAL KNEE REVISION;  Surgeon: Tarry Kos, MD;  Location: [facility];  Service: Orthopedics;  Laterality: Left;  TOTAL KNEE REVISION Left 03/08/2019   Procedure: LEFT TOTAL KNEE REVISION;  Surgeon: Tarry Kos, MD;  Location: [facility];  Service: Orthopedics;  Laterality: Left;   TOTAL KNEE REVISION Left 09/27/2020   Procedure: Left total knee arthroplasty revision;  Surgeon: Samson Frederic, MD;  Location: [facility];  Service: Orthopedics;  Laterality: Left;   Family History  Problem Relation Age of Onset   CAD Mother    Deep vein thrombosis Brother    Social History   Socioeconomic History   Marital status: Single    Spouse name: Not on file   Number of children: 0    Years of education: 12   Highest education level: GED or equivalent  Occupational History   Occupation: Chartered certified accountant  Tobacco Use   Smoking status: Former    Current packs/day: 0.00    Average packs/day: 1 pack/day for 37.0 years (37.0 ttl pk-yrs)    Types: Cigarettes    Start date: 04/21/1983    Quit date: 04/20/2020    Years since quitting: 2.7   Smokeless tobacco: Never   Tobacco comments:    Occasionally will smoke one  Vaping Use   Vaping status: Never Used  Substance and Sexual Activity   Alcohol use: Yes    Comment: occational   Drug use: No    Comment: hx of marijuana and cocaine, LSD   Sexual activity: Yes    Partners: Female    Birth control/protection: None  Other Topics Concern   Not on file  Social History Narrative   Not on file   Social Determinants of Health   Financial Resource Strain: Low Risk  (01/03/2023)   Overall Financial Resource Strain (CARDIA)    Difficulty of Paying Living Expenses: Not hard at all  Recent Concern: Financial Resource Strain - Medium Risk (12/16/2022)   Overall Financial Resource Strain (CARDIA)    Difficulty of Paying Living Expenses: Somewhat hard  Food Insecurity: No Food Insecurity (01/03/2023)   Hunger Vital Sign    Worried About Running Out of Food in the Last Year: Never true    Ran Out of Food in the Last Year: Never true  Recent Concern: Food Insecurity - Food Insecurity Present (12/16/2022)   Hunger Vital Sign    Worried About Running Out of Food in the Last Year: Sometimes true    Ran Out of Food in the Last Year: Sometimes true  Transportation Needs: No Transportation Needs (01/03/2023)   PRAPARE - Administrator, Civil Service (Medical): No    Lack of Transportation (Non-Medical): No  Physical Activity: Sufficiently Active (01/03/2023)   Exercise Vital Sign    Days of Exercise per Week: 4 days    Minutes of Exercise per Session: 60 min  Stress: No Stress Concern Present (01/03/2023)   [employer] of  Occupational Health - Occupational Stress Questionnaire    Feeling of Stress : Not at all  Social Connections: Moderately Isolated (01/03/2023)   Social Connection and Isolation Panel [NHANES]    Frequency of Communication with Friends and Family: More than three times a week    Frequency of Social Gatherings with Friends and Family: Never    Attends Religious Services: Never    Database administrator or Organizations: No    Attends Engineer, structural: More than 4 times per year    Marital Status: Never married    Tobacco Counseling Counseling given: Not Answered Tobacco comments: Occasionally will smoke one   Clinical Intake:  Pre-visit preparation completed: Yes  Pain : No/denies pain     BMI - recorded: 37.84 Nutritional Status: BMI > 30  Obese Nutritional Risks: None Diabetes: No  How often do you need to have someone help you when you read instructions, pamphlets, or other written materials from your doctor or pharmacy?: 1 - Never  Interpreter Needed?: No  Information entered by :: Theresa Mulligan LPN   Activities of Daily Living    01/03/2023    9:32 AM 01/02/2023    1:05 PM  In your present state of health, do you have any difficulty performing the following activities:  Hearing? 0 0  Vision? 0 0  Difficulty concentrating or making decisions? 0 0  Walking or climbing stairs? 0 1  Dressing or bathing? 0 0  Doing errands, shopping? 0 0  Preparing Food and eating ? N N  Using the Toilet? N N  In the past six months, have you accidently leaked urine? N N  Do you have problems with loss of bowel control? N N  Managing your Medications? N N  Managing your Finances? N N  Housekeeping or managing your Housekeeping? N N    Patient Care Team: Marcine Matar, MD as PCP - General (Internal Medicine)  Indicate any recent Medical Services you may have received from other than [facility] providers in the past year (date may be approximate).     Assessment:    This is a routine wellness examination for Akito.  Hearing/Vision screen Hearing Screening - Comments:: Denies hearing difficulties   Vision Screening - Comments:: Wears reading glasses -Not up to date with routine eye exams with  Pending  Dietary issues and exercise activities discussed:     Goals Addressed               This Visit's Progress     Lose weight (pt-stated)         Depression Screen    01/03/2023    9:32 AM 12/17/2022    8:45 AM 05/13/2022    9:46 AM 02/11/2022    2:42 PM 11/23/2021   10:31 AM 03/16/2021    3:35 PM 09/07/2020    2:11 PM  PHQ 2/9 Scores  PHQ - 2 Score 0 0 0 0 0 0 0  PHQ- 9 Score 0 2 2    0    Fall Risk    01/03/2023    9:33 AM 01/02/2023    1:05 PM 12/17/2022    8:39 AM 05/13/2022    9:42 AM 02/11/2022    2:41 PM  Fall Risk   Falls in the past year? 0 0 0 0 0  Number falls in past yr: 0 0 0 0 0  Injury with Fall? 0 0 0 0 0  Risk for fall due to : No Fall Risks  No Fall Risks No Fall Risks No Fall Risks  Follow up Falls prevention discussed        MEDICARE RISK AT HOME:    TIMED UP AND GO:  Was the test performed?  No    Cognitive Function:    11/23/2021   10:47 AM  MMSE - Mini Mental State Exam  Orientation to time 5  Orientation to Place 5  Registration 3  Attention/ Calculation 5  Recall 3  Language- name 2 objects 2  Language- repeat 1  Language- follow 3 step command 3  Language- read & follow direction 1  Write a sentence 1  Copy design 1  Total score 30        01/03/2023    9:34 AM  6CIT Screen  What Year? 0 points  What month? 0 points  What time? 0 points  Count back from 20 0 points  Months in reverse 0 points  Repeat phrase 0 points  Total Score 0 points    Immunizations Immunization History  Administered Date(s) Administered   Influenza,inj,Quad PF,6+ Mos 01/20/2017, 03/02/2018, 05/10/2019, 03/30/2020, 03/16/2021   Influenza-Unspecified 01/20/2017, 03/02/2018, 04/15/2019   PFIZER(Purple  Top)SARS-COV-2 Vaccination 08/21/2019, 09/11/2019, 05/12/2020   Pfizer Covid-19 Vaccine Bivalent Booster 66yrs & up 10/27/2020   Tdap 10/18/2016   Zoster Recombinant(Shingrix) 03/30/2021, 07/19/2021    TDAP status: Up to date  Flu Vaccine status: Due, Education has been provided regarding the importance of this vaccine. Advised may receive this vaccine at local pharmacy or Health Dept. Aware to provide a copy of the vaccination record if obtained from local pharmacy or Health Dept. Verbalized acceptance and understanding.    Covid-19 vaccine status: Completed vaccines  Qualifies for Shingles Vaccine? Yes   Zostavax completed Yes   Shingrix Completed?: Yes  Screening Tests Health Maintenance  Topic Date Due   INFLUENZA VACCINE  12/26/2022   COVID-19 Vaccine (5 - 2023-24 season) 02/25/2023 (Originally 01/25/2022)   Lung Cancer Screening  05/14/2023 (Originally 06/02/2016)   Medicare Annual Wellness (AWV)  01/03/2024   Colonoscopy  05/27/2024   DTaP/Tdap/Td (2 - Td or Tdap) 10/19/2026   Hepatitis C Screening  Completed   HIV Screening  Completed   Zoster Vaccines- Shingrix  Completed   HPV VACCINES  Aged Out    Health Maintenance  Health Maintenance Due  Topic Date Due   INFLUENZA VACCINE  12/26/2022    Colorectal cancer screening: Type of screening: Colonoscopy. Completed 05/27/14. Repeat every 10 years  Lung Cancer Screening: (Low Dose CT Chest recommended if Age 21-80 years, 20 pack-year currently smoking OR have quit w/in 15years.) does not qualify.     Additional Screening:  Hepatitis C Screening: does qualify; Completed 01/19/17  Vision Screening: Recommended annual ophthalmology exams for early detection of glaucoma and other disorders of the eye. Is the patient up to date with their annual eye exam?  No  Who is the provider or what is the name of the office in which the patient attends annual eye exams? Pending Appt If pt is not established with a provider, would  they like to be referred to a provider to establish care? No .   Dental Screening: Recommended annual dental exams for proper oral hygiene    Community Resource Referral / Chronic Care Management:  CRR required this visit?  No   CCM required this visit?  No     Plan:     I have personally reviewed and noted the following in the patient's chart:   Medical and social history Use of alcohol, tobacco or illicit drugs  Current medications and supplements including opioid prescriptions. Patient is not currently taking opioid prescriptions. Functional ability and status Nutritional status Physical activity Advanced directives List of other physicians Hospitalizations, surgeries, and ER visits in previous 12 months Vitals Screenings to include cognitive, depression, and falls Referrals and appointments  In addition, I have reviewed and discussed with patient certain preventive protocols, quality metrics, and best practice recommendations. A written personalized care plan for preventive services as well as general preventive health recommendations were provided to patient.     Tillie Rung, LPN   [id number]   After Visit  Summary: (MyChart) Due to this being a telephonic visit, the after visit summary with patients personalized plan was offered to patient via MyChart   Nurse Notes: None

## 2023-01-22 ENCOUNTER — Ambulatory Visit (HOSPITAL_COMMUNITY)
Admission: EM | Admit: 2023-01-22 | Discharge: 2023-01-22 | Disposition: A | Discharge status: Home / Self Care | Payer: Medicare HMO | Attending: Family | Admitting: Family

## 2023-01-22 DIAGNOSIS — F329 Major depressive disorder, single episode, unspecified: Secondary | ICD-10-CM | POA: Insufficient documentation

## 2023-01-22 DIAGNOSIS — F1911 Other psychoactive substance abuse, in remission: Secondary | ICD-10-CM | POA: Insufficient documentation
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
## 2023-01-22 NOTE — ED Provider Notes (Signed)
Behavioral Health Urgent Care Medical Screening Exam  Patient Name: Michael [name] MRN: [id number] Date of Evaluation: 01/22/23 Chief Complaint:   Diagnosis:  Final diagnoses:  Substance abuse in remission [facility])    History of Present illness: Michael [name] is a 62 y.o. male. Patient presents voluntarily to [facility] behavioral health for walk-in assessment.   Patient is assessed, face-to-face, by nurse practitioner. He is seated in assessment area, no acute distress.  He is alert and oriented, pleasant and cooperative during assessment. Patient  presents with euthymic mood, congruent affect.   Chart reviewed and patient discussed with Dr. Nelly Rout on 01/22/2023.  [name] reports recent stressors include "I have been using medicines like Suboxone and methadone on and off for 13 years, it feels like a life sentence, I want to try something new."  Patient reports frustration with chronic nature of Suboxone.  Patient stable on methadone for more than 10 years.  Transition to Suboxone 8mg  daily in early 2024. Suboxone managed by tele health provider.  Patient reports he recently "weaned myself off." Plans to follow up with previous telehealth provider to restart MAT. Reports decreased sleep for several days.  On Sunday patient used hydrocodone belonging to a friend.  He also had alcohol, 2 drinks.  He also used marijuana.  Patient uses marijuana rarely, less than once per year.  Uses alcohol an average of 2 times per year.  Has not used hydrocodone or other opioid medications for more than 1 year.  Patient states "could not be prescribed something like clonazepam?"  He reports difficulty sleeping.  Primary care provider has prescribed trazodone, patient reports this medication is not effective.  Marquett is not linked with outpatient psychiatry, no current medications to address mood.  No personal mental health history per his report.  No history of inpatient psychiatric hospitalization.   Family mental health history includes patient's mother diagnosed with major depressive disorder.  He  denies suicidal and homicidal ideations. Denies history of suicide attempts, denies history of non suicidal self-harm behavior.  Patient easily  contracts verbally for safety with this Clinical research associate.    Patient has normal speech and behavior.  He  denies auditory and visual hallucinations.  Patient is able to converse coherently with goal-directed thoughts and no distractibility or preoccupation.  Denies symptoms of paranoia.  Objectively there is no evidence of psychosis/mania or delusional thinking.  Michael [name] resides alone in [place], he denies access to weapons.  He reports he is retired and receives disability income.  Patient endorses average appetite.  Patient offered support and encouragement.  He declines any person contact for collateral information at this time.   Patient educated and verbalizes understanding of mental health resources and other crisis services in the community. They are instructed to call 911 and present to the nearest emergency room should patient experience any suicidal/homicidal ideation, auditory/visual/hallucinations, or detrimental worsening of mental health condition.      Flowsheet Row ED from 01/22/2023 in [facility] ED from 07/25/2022 in [facility] at [facility] ED from 06/26/2022 in [facility] Emergency Department at [facility]  C-SSRS RISK CATEGORY No Risk No Risk No Risk       Psychiatric Specialty Exam  Presentation  General Appearance:Appropriate for Environment; Casual  Eye Contact:Good  Speech:Clear and Coherent; Normal Rate  Speech Volume:Normal  Handedness:Right   Mood and Affect  Mood: Euthymic  Affect: Appropriate; Congruent   Thought Process  Thought Processes: Coherent; Goal  Directed; Linear  Descriptions of Associations:Intact  Orientation:Full (Time, Place  and Person)  Thought Content:Logical; WDL  Diagnosis of Schizophrenia or Schizoaffective disorder in past: No   Hallucinations:None  Ideas of Reference:None  Suicidal Thoughts:No  Homicidal Thoughts:No   Sensorium  Memory: Immediate Good; Recent Good  Judgment: Good  Insight: Fair   Executive Functions  Concentration: Good  Attention Span: Good  Recall: Good  Fund of Knowledge: Good  Language: Good   Psychomotor Activity  Psychomotor Activity: Normal   Assets  Assets: Communication Skills; Desire for Improvement; Financial Resources/Insurance; Housing; Physical Health; Resilience; Social Support   Sleep  Sleep: Poor  Number of hours: No data recorded  Physical Exam: Physical Exam Vitals and nursing note reviewed.  Constitutional:      Appearance: Normal appearance. He is well-developed.  HENT:     Head: Normocephalic and atraumatic.     Nose: Nose normal.  Cardiovascular:     Rate and Rhythm: Normal rate.  Pulmonary:     Effort: Pulmonary effort is normal.  Musculoskeletal:        General: Normal range of motion.     Cervical back: Normal range of motion.  Skin:    General: Skin is warm and dry.  Neurological:     Mental Status: He is alert and oriented to person, place, and time.  Psychiatric:        Attention and Perception: Attention and perception normal.        Mood and Affect: Mood and affect normal.        Speech: Speech normal.        Behavior: Behavior normal. Behavior is cooperative.        Thought Content: Thought content normal.        Cognition and Memory: Cognition and memory normal.    Review of Systems  Constitutional: Negative.   HENT: Negative.    Eyes: Negative.   Respiratory: Negative.    Cardiovascular: Negative.   Gastrointestinal: Negative.   Genitourinary: Negative.   Musculoskeletal: Negative.   Skin: Negative.   Neurological: Negative.   Psychiatric/Behavioral: Negative.     Blood pressure (!)  154/92, pulse 99, temperature 97.6 F (36.4 C), temperature source Oral, resp. rate 19, SpO2 100%. There is no height or weight on file to calculate BMI.  Musculoskeletal: Strength & Muscle Tone: within normal limits Gait & Station: normal Patient leans: N/A   [facility] MSE Discharge Disposition for Follow up and Recommendations: Based on my evaluation the patient does not appear to have an emergency medical condition and can be discharged with resources and follow up care in outpatient services for Medication Management and Individual Therapy Follow-up with outpatient resources provided.  Follow-up with established primary care provider.  Continue current medications.  Lenard Lance, FNP 01/22/2023, 11:06 AM

## 2023-01-22 NOTE — Discharge Instructions (Addendum)

## 2023-01-22 NOTE — Progress Notes (Signed)
   01/22/23 0805  [facility] Triage Screening (Walk-ins at [facility] only)  How Did You Hear About [place]? Self  What Is the Reason for Your Visit/Call Today? Pt presents to [facility] voluntarily by himself. Pt states, "I have not slept in three days and I am coming out of my skin." Pt reports that he believes he is experiencing withdrawls from Suboxone since Sunday. Pt states, "I am very uncomfortable and I feel sick from this withdrawl." Pt reports to be on high blood pressure medication. Pt reports to have body aches at this time and wishes to be "done with this pain". Pt reports hx of drug use, but reports nothing is currently in his system. Pt denies substance use, SI, HI and AVH at this time.  How Long Has This Been Causing You Problems? <Week  Have You Recently Had Any Thoughts About Hurting Yourself? No  Are You Planning to Commit Suicide/Harm Yourself At This time? No  Have you Recently Had Thoughts About Hurting Someone Karolee Ohs? No  Are You Planning To Harm Someone At This Time? No  Are you currently experiencing any auditory, visual or other hallucinations? No  Have You Used Any Alcohol or Drugs in the Past 24 Hours? No  Do you have any current medical co-morbidities that require immediate attention? No  Clinician description of patient physical appearance/behavior: anxious, cooperative, well groomed  What Do You Feel Would Help You the Most Today? Treatment for Depression or other mood problem;Medication(s)  If access to [facility] Urgent Care was not available, would you have sought care in the Emergency Department? No  Determination of Need Urgent (48 hours)  Options For Referral Facility-Based Crisis;Medication Management

## 2023-02-05 ENCOUNTER — Ambulatory Visit
Admission: EM | Admit: 2023-02-05 | Discharge: 2023-02-05 | Disposition: A | Discharge status: Home / Self Care | Payer: Medicare HMO

## 2023-02-05 ENCOUNTER — Encounter: Payer: Self-pay | Admitting: Internal Medicine

## 2023-02-05 ENCOUNTER — Encounter (HOSPITAL_COMMUNITY): Payer: Self-pay

## 2023-02-05 ENCOUNTER — Emergency Department (HOSPITAL_COMMUNITY): Payer: Medicare HMO

## 2023-02-05 ENCOUNTER — Other Ambulatory Visit: Payer: Self-pay

## 2023-02-05 ENCOUNTER — Emergency Department (HOSPITAL_COMMUNITY)
Admission: EM | Admit: 2023-02-05 | Discharge: 2023-02-06 | Discharge status: Against Medical Advice | Payer: Medicare HMO | Source: Home / Self Care | Attending: Emergency Medicine | Admitting: Emergency Medicine

## 2023-02-05 ENCOUNTER — Encounter (HOSPITAL_COMMUNITY): Payer: Self-pay | Admitting: Emergency Medicine

## 2023-02-05 ENCOUNTER — Emergency Department (HOSPITAL_COMMUNITY)
Admission: EM | Admit: 2023-02-05 | Discharge: 2023-02-05 | Discharge status: Against Medical Advice | Payer: Medicare HMO | Attending: Emergency Medicine | Admitting: Emergency Medicine

## 2023-02-05 DIAGNOSIS — Z5329 Procedure and treatment not carried out because of patient's decision for other reasons: Secondary | ICD-10-CM | POA: Insufficient documentation

## 2023-02-05 DIAGNOSIS — R519 Headache, unspecified: Secondary | ICD-10-CM

## 2023-02-05 DIAGNOSIS — R600 Localized edema: Secondary | ICD-10-CM | POA: Insufficient documentation

## 2023-02-05 DIAGNOSIS — Z79899 Other long term (current) drug therapy: Secondary | ICD-10-CM | POA: Insufficient documentation

## 2023-02-05 DIAGNOSIS — R1011 Right upper quadrant pain: Secondary | ICD-10-CM

## 2023-02-05 DIAGNOSIS — R791 Abnormal coagulation profile: Secondary | ICD-10-CM | POA: Insufficient documentation

## 2023-02-05 DIAGNOSIS — I10 Essential (primary) hypertension: Secondary | ICD-10-CM | POA: Insufficient documentation

## 2023-02-05 DIAGNOSIS — R140 Abdominal distension (gaseous): Secondary | ICD-10-CM | POA: Insufficient documentation

## 2023-02-05 DIAGNOSIS — R0602 Shortness of breath: Secondary | ICD-10-CM

## 2023-02-05 LAB — COMPREHENSIVE METABOLIC PANEL
ALT: 25 U/L (ref 0–44)
AST: 23 U/L (ref 15–41)
Albumin: 3.7 g/dL (ref 3.5–5.0)
Alkaline Phosphatase: 79 U/L (ref 38–126)
Anion gap: 9 (ref 5–15)
BUN: 15 mg/dL (ref 8–23)
CO2: 23 mmol/L (ref 22–32)
Calcium: 8.8 mg/dL — ABNORMAL LOW (ref 8.9–10.3)
Chloride: 100 mmol/L (ref 98–111)
Creatinine, Ser: 0.75 mg/dL (ref 0.61–1.24)
GFR, Estimated: >60 mL/min (ref >60)
Glucose, Bld: 97 mg/dL (ref 70–99)
Potassium: 3.9 mmol/L (ref 3.5–5.1)
Sodium: 132 mmol/L — ABNORMAL LOW (ref 135–145)
Total Bilirubin: 0.5 mg/dL (ref 0.3–1.2)
Total Protein: 6.7 g/dL (ref 6.5–8.1)

## 2023-02-05 LAB — URINALYSIS, ROUTINE W REFLEX MICROSCOPIC
Bacteria, UA: NONE SEEN
Bilirubin Urine: NEGATIVE
Glucose, UA: NEGATIVE mg/dL
Hgb urine dipstick: NEGATIVE
Ketones, ur: NEGATIVE mg/dL
Nitrite: NEGATIVE
Protein, ur: NEGATIVE mg/dL
Specific Gravity, Urine: 1.006 (ref 1.005–1.030)
pH: 5 (ref 5.0–8.0)

## 2023-02-05 LAB — CBC
HCT: 38.6 % — ABNORMAL LOW (ref 39.0–52.0)
Hemoglobin: 12.7 g/dL — ABNORMAL LOW (ref 13.0–17.0)
MCH: 29.7 pg (ref 26.0–34.0)
MCHC: 32.9 g/dL (ref 30.0–36.0)
MCV: 90.2 fL (ref 80.0–100.0)
Platelets: 279 10*3/uL (ref 150–400)
RBC: 4.28 MIL/uL (ref 4.22–5.81)
RDW: 13.4 % (ref 11.5–15.5)
WBC: 6.5 10*3/uL (ref 4.0–10.5)
nRBC: 0 % (ref 0.0–0.2)

## 2023-02-05 LAB — LIPASE, BLOOD: Lipase: 26 U/L (ref 11–51)

## 2023-02-05 LAB — BRAIN NATRIURETIC PEPTIDE: B Natriuretic Peptide: 18.5 pg/mL (ref 0.0–100.0)

## 2023-02-05 NOTE — ED Provider Notes (Signed)
  Physical Exam  BP 134/89 (BP Location: Right Arm)   Pulse 77   Temp 97.8 F (36.6 C) (Oral)   Resp 16   Ht 5\' 11"  (1.803 m)   Wt 120.2 kg   SpO2 98%   BMI 36.96 kg/m   Physical Exam  Procedures  Procedures  ED Course / MDM   Clinical Course as of 02/05/23 1733  Wed Feb 05, 2023  1515 Signout to Dr. Andria Meuse [RP]    Clinical Course User Index [RP] Rondel Baton, MD   Medical Decision Making Amount and/or Complexity of Data Reviewed Labs: ordered. Radiology: ordered.   I, Rosana Berger, assumed care for this patient.  In brief 62 year old male came to the emergency department for abdominal pain.  Patient was a signout to me pending ultrasound imaging.  I assessed the patient, ordered a CT scan of the abdomen pelvis.  I was then informed by nursing staff that the patient had eloped from the emergency department.       Anders Simmonds T, DO 02/05/23 1734

## 2023-02-05 NOTE — ED Notes (Signed)
IV team went into pt room to obtain IV for imaging, pt or pt belongings not found in room at this time. MD made aware.

## 2023-02-05 NOTE — Progress Notes (Signed)
Patient wasn't in the room. Informed patient's RN regarding this. HS [employer]

## 2023-02-05 NOTE — ED Notes (Signed)
Pt ambulated out of ED independently unwitnessed by staff due to prolonged waiting and no longer felt like being here. MD aware of pt elopement.

## 2023-02-05 NOTE — ED Notes (Signed)
Patient is being discharged from the Urgent Care and sent to the Emergency Department via POV . Per Ervin Knack, NP, patient is in need of higher level of care due to Abdominal pain, SOB, leg swelling. Patient is aware and verbalizes understanding of plan of care.  Vitals:   02/05/23 1102  BP: (!) 144/85  Pulse: 77  Resp: 20  Temp: 98 F (36.7 C)  SpO2: 97%

## 2023-02-05 NOTE — ED Triage Notes (Signed)
C/o sharp RUQ abd pain x 2 days with bloating and extremity swelling. Also c/o Shob with walking and right side of face is sensitive to touch

## 2023-02-05 NOTE — ED Provider Notes (Signed)
[place] EMERGENCY DEPARTMENT AT [facility] Provider Note   CSN: [id number] Arrival date & time: 02/05/23  2041     History {Add pertinent medical, surgical, social history, OB history to HPI:1} Chief Complaint  Patient presents with   Abdominal Pain    Michael [name] is a 62 y.o. male.  The history is provided by the patient and medical records.  Abdominal Pain Michael [name] is a 62 y.o. male who presents to the Emergency Department complaining of abdominal pain.  He presents to the emergency department for evaluation of 3 days of right upper quadrant pain.  Pain is sharp and stabbing.  He has associated bloating.  Pain radiates to the back at times.  He did have back pain several weeks ago.  He also reports bilateral lower extremity edema.  No shortness of breath.  No chest pain.  No nausea, vomiting, diarrhea.  He has a history of hypertension, hyperlipidemia.  He was evaluated in the emergency department at [facility] earlier today and had a right upper quadrant ultrasound performed.  The plan was to obtain a CT abdomen pelvis but he eloped from the emergency department.  He is a family history of clotting disorder in sister and brother.  He has no history personally of blood clots.    Home Medications Prior to Admission medications   Medication Sig Start Date End Date Taking? Authorizing Provider  amLODipine (NORVASC) 10 MG tablet Take 1 tablet (10 mg total) by mouth daily. 12/17/22   Marcine Matar, MD  atorvastatin (LIPITOR) 20 MG tablet Take 1 tablet (20 mg total) by mouth daily. 12/17/22   Marcine Matar, MD  hydrALAZINE (APRESOLINE) 25 MG tablet Take 1 tablet (25 mg total) by mouth 2 (two) times daily. 12/17/22   Marcine Matar, MD  lisinopril-hydrochlorothiazide (ZESTORETIC) 20-25 MG tablet Take 1 tablet by mouth daily. 12/17/22   Marcine Matar, MD  carvedilol (COREG) 6.25 MG tablet Take 1 tablet (6.25 mg total) by mouth 2 (two) times daily  with a meal. 06/10/19 06/17/19  Marcine Matar, MD      Allergies    Patient has no known allergies.    Review of Systems   Review of Systems  Gastrointestinal:  Positive for abdominal pain.  All other systems reviewed and are negative.   Physical Exam Updated Vital Signs BP (!) 156/76 (BP Location: Left Arm)   Pulse 100   Temp 98 F (36.7 C) (Oral)   Resp 17   SpO2 95%  Physical Exam Vitals and nursing note reviewed.  Constitutional:      Appearance: He is well-developed.  HENT:     Head: Normocephalic and atraumatic.  Cardiovascular:     Rate and Rhythm: Normal rate and regular rhythm.     Heart sounds: No murmur heard. Pulmonary:     Effort: Pulmonary effort is normal. No respiratory distress.     Breath sounds: Normal breath sounds.  Abdominal:     Palpations: Abdomen is soft.     Tenderness: There is no guarding or rebound.     Comments: Mild generalized abdominal tenderness  Musculoskeletal:        General: No tenderness.     Comments: Pitting edema to bilateral lower extremities.  Skin:    General: Skin is warm and dry.  Neurological:     Mental Status: He is alert and oriented to person, place, and time.  Psychiatric:  Behavior: Behavior normal.     ED Results / Procedures / Treatments   Labs (all labs ordered are listed, but only abnormal results are displayed) Labs Reviewed - No data to display  EKG None  Radiology US Abdomen Limited RUQ (LIVER/GB)  Result Date: 02/05/2023 CLINICAL DATA:  Right upper quadrant abdominal pain EXAM: ULTRASOUND ABDOMEN LIMITED RIGHT UPPER QUADRANT COMPARISON:  None Available. FINDINGS: Gallbladder: No gallstones or wall thickening visualized. No sonographic Murphy sign noted by sonographer. Common bile duct: Diameter: 4 mm, within normal limits. No intrahepatic biliary duct dilatation. Liver: No focal lesion identified. Increased parenchymal echogenicity. Portal vein is patent on color Doppler imaging with  normal direction of blood flow towards the liver. Other: None. IMPRESSION: 1. No acute findings. 2. Hepatic steatosis. Electronically Signed   By: Wiliam Ke M.D.   On: 02/05/2023 15:42   DG Chest 2 View  Result Date: 02/05/2023 CLINICAL DATA:  sob, ruq pain EXAM: CHEST - 2 VIEW COMPARISON:  Chest x-ray 06/26/2022 FINDINGS: The heart and mediastinal contours are within normal limits. No focal consolidation. No pulmonary edema. No pleural effusion. No pneumothorax. No acute osseous abnormality. IMPRESSION: No active cardiopulmonary disease. Electronically Signed   By: Tish Frederickson M.D.   On: 02/05/2023 14:28    Procedures Procedures  {Document cardiac monitor, telemetry assessment procedure when appropriate:1}  Medications Ordered in ED Medications - No data to display  ED Course/ Medical Decision Making/ A&P   {   Click here for ABCD2, HEART and other calculatorsREFRESH Note before signing :1}                              Medical Decision Making Amount and/or Complexity of Data Reviewed Labs: ordered.   ***  {Document critical care time when appropriate:1} {Document review of labs and clinical decision tools ie heart score, Chads2Vasc2 etc:1}  {Document your independent review of radiology images, and any outside records:1} {Document your discussion with family members, caretakers, and with consultants:1} {Document social determinants of health affecting pt's care:1} {Document your decision making why or why not admission, treatments were needed:1} Final Clinical Impression(s) / ED Diagnoses Final diagnoses:  None    Rx / DC Orders ED Discharge Orders     None

## 2023-02-05 NOTE — ED Triage Notes (Signed)
Pt arrives via POV. Pt reports right upper quadrant pain x 2 days. Denies n/v or urinary symptoms. Pt worsens after he eats.

## 2023-02-05 NOTE — ED Triage Notes (Signed)
Pt c/o continued RUQ abdominal pain. Pt was at [facility] ED, but left because he was tired of waiting for a CT scan.

## 2023-02-05 NOTE — ED Provider Notes (Signed)
[place] EMERGENCY DEPARTMENT AT [facility] Provider Note   CSN: [id number] Arrival date & time: 02/05/23  1135     History  Chief Complaint  Patient presents with   Abdominal Pain    Michael [name] is a 62 y.o. male.  62 year old male with a history of hypertension, hyperlipidemia, hepatitis C status post treatment, and opiate abuse disorder on methadone who presents to the emergency department with upper abdominal pain for 2 days.  Patient reports that he has had right upper quadrant abdominal pain for the past 2 days.  Says it is worsened with moving as well as eating.  Also reports mild bilateral lower extremity swelling and some shortness of breath.  No fevers.  No vomiting.  Has had some abdominal bloating today.       Home Medications Prior to Admission medications   Medication Sig Start Date End Date Taking? Authorizing Provider  amLODipine (NORVASC) 10 MG tablet Take 1 tablet (10 mg total) by mouth daily. 12/17/22   Marcine Matar, MD  atorvastatin (LIPITOR) 20 MG tablet Take 1 tablet (20 mg total) by mouth daily. 12/17/22   Marcine Matar, MD  hydrALAZINE (APRESOLINE) 25 MG tablet Take 1 tablet (25 mg total) by mouth 2 (two) times daily. 12/17/22   Marcine Matar, MD  lisinopril-hydrochlorothiazide (ZESTORETIC) 20-25 MG tablet Take 1 tablet by mouth daily. 12/17/22   Marcine Matar, MD  carvedilol (COREG) 6.25 MG tablet Take 1 tablet (6.25 mg total) by mouth 2 (two) times daily with a meal. 06/10/19 06/17/19  Marcine Matar, MD      Allergies    Patient has no known allergies.    Review of Systems   Review of Systems  Physical Exam Updated Vital Signs BP 134/89 (BP Location: Right Arm)   Pulse 77   Temp 97.8 F (36.6 C) (Oral)   Resp 16   Ht 5\' 11"  (1.803 m)   Wt 120.2 kg   SpO2 98%   BMI 36.96 kg/m  Physical Exam Vitals and nursing note reviewed.  Constitutional:      General: He is not in acute distress.    Appearance:  He is well-developed.  HENT:     Head: Normocephalic and atraumatic.     Right Ear: External ear normal.     Left Ear: External ear normal.     Nose: Nose normal.  Eyes:     Extraocular Movements: Extraocular movements intact.     Conjunctiva/sclera: Conjunctivae normal.     Pupils: Pupils are equal, round, and reactive to light.  Cardiovascular:     Rate and Rhythm: Normal rate and regular rhythm.     Heart sounds: Normal heart sounds.  Pulmonary:     Effort: Pulmonary effort is normal. No respiratory distress.     Breath sounds: Normal breath sounds.  Abdominal:     General: There is distension.     Palpations: Abdomen is soft. There is no mass.     Tenderness: There is abdominal tenderness (Right upper quadrant). There is no guarding.  Musculoskeletal:     Cervical back: Normal range of motion and neck supple.     Right lower leg: Edema present.     Left lower leg: Edema present.  Skin:    General: Skin is warm and dry.  Neurological:     Mental Status: He is alert. Mental status is at baseline.  Psychiatric:        Mood and Affect:  Mood normal.        Behavior: Behavior normal.     ED Results / Procedures / Treatments   Labs (all labs ordered are listed, but only abnormal results are displayed) Labs Reviewed  COMPREHENSIVE METABOLIC PANEL - Abnormal; Notable for the following components:      Result Value   Sodium 132 (*)    Calcium 8.8 (*)    All other components within normal limits  CBC - Abnormal; Notable for the following components:   Hemoglobin 12.7 (*)    HCT 38.6 (*)    All other components within normal limits  URINALYSIS, ROUTINE W REFLEX MICROSCOPIC - Abnormal; Notable for the following components:   Color, Urine STRAW (*)    Leukocytes,Ua TRACE (*)    All other components within normal limits  LIPASE, BLOOD  BRAIN NATRIURETIC PEPTIDE    EKG None  Radiology US Abdomen Limited RUQ (LIVER/GB)  Result Date: 02/05/2023 CLINICAL DATA:  Right  upper quadrant abdominal pain EXAM: ULTRASOUND ABDOMEN LIMITED RIGHT UPPER QUADRANT COMPARISON:  None Available. FINDINGS: Gallbladder: No gallstones or wall thickening visualized. No sonographic Murphy sign noted by sonographer. Common bile duct: Diameter: 4 mm, within normal limits. No intrahepatic biliary duct dilatation. Liver: No focal lesion identified. Increased parenchymal echogenicity. Portal vein is patent on color Doppler imaging with normal direction of blood flow towards the liver. Other: None. IMPRESSION: 1. No acute findings. 2. Hepatic steatosis. Electronically Signed   By: Wiliam Ke M.D.   On: 02/05/2023 15:42   DG Chest 2 View  Result Date: 02/05/2023 CLINICAL DATA:  sob, ruq pain EXAM: CHEST - 2 VIEW COMPARISON:  Chest x-ray 06/26/2022 FINDINGS: The heart and mediastinal contours are within normal limits. No focal consolidation. No pulmonary edema. No pleural effusion. No pneumothorax. No acute osseous abnormality. IMPRESSION: No active cardiopulmonary disease. Electronically Signed   By: Tish Frederickson M.D.   On: 02/05/2023 14:28    Procedures Procedures    Medications Ordered in ED Medications - No data to display  ED Course/ Medical Decision Making/ A&P Clinical Course as of 02/05/23 1726  Wed Feb 05, 2023  1515 Signout to Dr. Andria Meuse [RP]    Clinical Course User Index [RP] Rondel Baton, MD                                 Medical Decision Making Amount and/or Complexity of Data Reviewed Labs: ordered. Radiology: ordered.   Michael [name] is a 62 y.o. male with comorbidities that complicate the patient evaluation including hypertension, hyperlipidemia, hepatitis C status post treatment, and opiate abuse disorder on methadone who presents to the emergency department with upper abdominal pain for 2 days.   Initial Ddx:  Cholecystitis, pancreatitis, pneumonia, colitis, CHF, PE  MDM/Course:  Patient presents to the emergency department with right  upper quadrant and epigastric abdominal pain for 2 days.  Worsened with eating.  Does have some abdominal distention and tenderness to palpation in his epigastrium and right upper quadrant.  Initially suspecting possible cholecystitis so he had a right upper quadrant ultrasound that did not show any evidence of cholecystitis.  Also had a chest x-ray which did not show any infiltrates that would be concerning for pneumonia.  With his swelling also obtained a BNP which is pending at this time.  Did consider pulmonary embolism as well.  Signed out to the oncoming physician awaiting additional imaging and reevaluation.  This patient presents to the ED for concern of complaints listed in HPI, this involves an extensive number of treatment options, and is a complaint that carries with it a high risk of complications and morbidity. Disposition including potential need for admission considered.   Dispo: Pending remainder of workup  Records reviewed Outpatient Clinic Notes The following labs were independently interpreted: Chemistry and show no acute abnormality I independently reviewed the following imaging with scope of interpretation limited to determining acute life threatening conditions related to emergency care: CT Abdomen/Pelvis and agree with the radiologist interpretation with the following exceptions: none I personally reviewed and interpreted cardiac monitoring: normal sinus rhythm  I personally reviewed and interpreted the pt's EKG: see above for interpretation  I have reviewed the patients home medications and made adjustments as needed  Final Clinical Impression(s) / ED Diagnoses Final diagnoses:  Right upper quadrant abdominal pain  Lower extremity edema    Rx / DC Orders ED Discharge Orders     None         Rondel Baton, MD 02/05/23 1726

## 2023-02-05 NOTE — ED Provider Notes (Signed)
[facility]    CSN: [id number] Arrival date & time: 02/05/23  0946      History   Chief Complaint Chief Complaint  Patient presents with   Abdominal Pain    HPI Michael [name] is a 62 y.o. male.   Patient presents with approximately 2-day history of right sided upper abdominal pain and feeling of bloating.  Reports that it is sharp, intermittent pain and he rates it 8/10 on pain scale.  Also reports that he developed right-sided facial pain today.  He also has had some shortness of breath that is intermittent along with some lower extremity swelling that started around the same time as abdominal pain.  Denies nausea, vomiting, fever, chills.  Patient having normal bowel movements and denies blood in stool.   Abdominal Pain   Past Medical History:  Diagnosis Date   Arthritis    Hepatitis    Hep C rx took treatment.   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   Right rotator cuff tear 01/29/2013    Past Surgical History:  Procedure Laterality Date   COLONOSCOPY     left knee arthroscopy      left rotator cuff surgery      REVISION TOTAL KNEE ARTHROPLASTY Left 06/15/2018   SHOULDER OPEN ROTATOR CUFF REPAIR Right 01/29/2013   Procedure: RIGHT SHOULDER MINI ROTATOR CUFF REPAIR;  Surgeon: Javier Docker, MD;  Location: [facility];  Service: Orthopedics;  Laterality: Right;  With ANCHORS   TOTAL  HIP ARTHROPLASTY Left 06/08/2020   Procedure: TOTAL HIP ARTHROPLASTY ANTERIOR APPROACH;  Surgeon: Samson Frederic, MD;  Location: [facility];  Service: Orthopedics;  Laterality: Left;   TOTAL KNEE ARTHROPLASTY Left 02/12/2017   TOTAL KNEE ARTHROPLASTY Left 02/12/2017   Procedure: LEFT TOTAL KNEE ARTHROPLASTY;  Surgeon: Tarry Kos, MD;  Location: [facility] OR;  Service: Orthopedics;  Laterality: Left;   TOTAL KNEE REVISION Left 06/15/2018   Procedure: LEFT TOTAL KNEE REVISION;  Surgeon: Tarry Kos, MD;  Location: [facility] OR;  Service: Orthopedics;  Laterality: Left;   TOTAL KNEE REVISION Left 03/08/2019   Procedure: LEFT TOTAL KNEE REVISION;  Surgeon: Tarry Kos, MD;  Location: [facility] OR;  Service: Orthopedics;  Laterality: Left;   TOTAL KNEE REVISION Left 09/27/2020   Procedure: Left total knee arthroplasty revision;  Surgeon: Samson Frederic, MD;  Location: [facility];  Service: Orthopedics;  Laterality: Left;       Home Medications    Prior to Admission medications   Medication Sig Start Date End Date Taking? Authorizing Provider  amLODipine (NORVASC) 10 MG tablet Take 1 tablet (10 mg total) by mouth daily. 12/17/22   Marcine Matar, MD  atorvastatin (LIPITOR) 20 MG tablet Take 1 tablet (20 mg total) by mouth daily. 12/17/22   Marcine Matar, MD  hydrALAZINE (APRESOLINE) 25 MG tablet Take 1 tablet (25 mg total) by mouth 2 (two) times daily. 12/17/22  Marcine Matar, MD  lisinopril-hydrochlorothiazide (ZESTORETIC) 20-25 MG tablet Take 1 tablet by mouth daily. 12/17/22   Marcine Matar, MD  carvedilol (COREG) 6.25 MG tablet Take 1 tablet (6.25 mg total) by mouth 2 (two) times daily with a meal. 06/10/19 06/17/19  Marcine Matar, MD    Family History Family History  Problem Relation Age of Onset   CAD Mother    Deep vein thrombosis Brother     Social History Social History   Tobacco Use   Smoking status: Former    Current packs/day: 0.00    Average packs/day: 1 pack/day for 37.0  years (37.0 ttl pk-yrs)    Types: Cigarettes    Start date: 04/21/1983    Quit date: 04/20/2020    Years since quitting: 2.7   Smokeless tobacco: Never   Tobacco comments:    Occasionally will smoke one  Vaping Use   Vaping status: Never Used  Substance Use Topics   Alcohol use: Yes    Comment: occational   Drug use: No    Comment: hx of marijuana and cocaine, LSD     Allergies   Patient has no known allergies.   Review of Systems Review of Systems Per HPI  Physical Exam Triage Vital Signs ED Triage Vitals  Encounter Vitals Group     BP 02/05/23 1102 (!) 144/85     Systolic BP Percentile --      Diastolic BP Percentile --      Pulse Rate 02/05/23 1102 77     Resp 02/05/23 1102 20     Temp 02/05/23 1102 98 F (36.7 C)     Temp Source 02/05/23 1102 Oral     SpO2 02/05/23 1102 97 %     Weight --      Height --      Head Circumference --      Peak Flow --      Pain Score 02/05/23 1107 8     Pain Loc --      Pain Education --      Exclude from Growth Chart --    No data found.  Updated Vital Signs BP (!) 144/85 (BP Location: Left Arm)   Pulse 77   Temp 98 F (36.7 C) (Oral)   Resp 20   SpO2 97%   Visual Acuity Right Eye Distance:   Left Eye Distance:   Bilateral Distance:    Right Eye Near:   Left Eye Near:    Bilateral Near:     Physical Exam Constitutional:      General: He is not in acute distress.    Appearance: Normal appearance. He is not toxic-appearing or diaphoretic.     Comments: Patient is guarding stomach and is groaning a lot due to pain.  HENT:     Head: Normocephalic and atraumatic.     Comments: Facial evaluation deferred given priority of severe abdominal pain.  Eyes:     Extraocular Movements: Extraocular movements intact.     Conjunctiva/sclera: Conjunctivae normal.  Cardiovascular:     Rate and Rhythm: Normal rate and regular rhythm.     Pulses: Normal pulses.     Heart sounds: Normal heart sounds.  Pulmonary:      Effort: Pulmonary effort is normal. No respiratory distress.     Breath sounds: Normal breath sounds.  Abdominal:     General: Bowel sounds are normal.     Tenderness: There is abdominal tenderness in the right upper quadrant.  There is guarding.     Comments: Patient is significantly tender to palpation to right side of abdomen especially at the right upper quadrant.  Skin:    Comments: Notable lower extremity swelling up to the knees.  Neurological:     General: No focal deficit present.     Mental Status: He is alert and oriented to person, place, and time. Mental status is at baseline.  Psychiatric:        Mood and Affect: Mood normal.        Behavior: Behavior normal.        Thought Content: Thought content normal.        Judgment: Judgment normal.      UC Treatments / Results  Labs (all labs ordered are listed, but only abnormal results are displayed) Labs Reviewed - No data to display  EKG   Radiology No results found.  Procedures Procedures (including critical care time)  Medications Ordered in UC Medications - No data to display  Initial Impression / Assessment and Plan / UC Course  I have reviewed the triage vital signs and the nursing notes.  Pertinent labs & imaging results that were available during my care of the patient were reviewed by me and considered in my medical decision making (see chart for details).     Patient has severe abdominal pain with associated facial pain and lower extremity swelling.  I do think that emergent evaluation with stat blood work and CT imaging is necessary.,  Patient was advised to go to the emergency department as soon as possible.  He was agreeable with this plan.  Vital signs stable at discharge.  Agree with patient self transport to the ER. Final Clinical Impressions(s) / UC Diagnoses   Final diagnoses:  Right upper quadrant abdominal pain  Facial pain  Bilateral lower extremity edema  Shortness of breath    Discharge Instructions   None    ED Prescriptions   None    PDMP not reviewed this encounter.   Gustavus Bryant, [place] 02/05/23 [phone]

## 2023-02-06 ENCOUNTER — Telehealth: Payer: Self-pay | Admitting: Internal Medicine

## 2023-02-06 ENCOUNTER — Emergency Department (HOSPITAL_COMMUNITY): Payer: Medicare HMO

## 2023-02-06 ENCOUNTER — Encounter: Payer: Self-pay | Admitting: Internal Medicine

## 2023-02-06 ENCOUNTER — Encounter (HOSPITAL_COMMUNITY): Payer: Self-pay

## 2023-02-06 ENCOUNTER — Other Ambulatory Visit: Payer: Self-pay | Admitting: Internal Medicine

## 2023-02-06 ENCOUNTER — Emergency Department (HOSPITAL_COMMUNITY)
Admission: EM | Admit: 2023-02-06 | Discharge: 2023-02-06 | Disposition: A | Discharge status: Home / Self Care | Payer: Medicare HMO | Attending: Emergency Medicine | Admitting: Emergency Medicine

## 2023-02-06 DIAGNOSIS — R1011 Right upper quadrant pain: Secondary | ICD-10-CM | POA: Insufficient documentation

## 2023-02-06 DIAGNOSIS — I10 Essential (primary) hypertension: Secondary | ICD-10-CM | POA: Insufficient documentation

## 2023-02-06 DIAGNOSIS — K469 Unspecified abdominal hernia without obstruction or gangrene: Secondary | ICD-10-CM

## 2023-02-06 DIAGNOSIS — R600 Localized edema: Secondary | ICD-10-CM

## 2023-02-06 DIAGNOSIS — Z79899 Other long term (current) drug therapy: Secondary | ICD-10-CM | POA: Insufficient documentation

## 2023-02-06 DIAGNOSIS — K458 Other specified abdominal hernia without obstruction or gangrene: Secondary | ICD-10-CM | POA: Diagnosis not present

## 2023-02-06 DIAGNOSIS — R7989 Other specified abnormal findings of blood chemistry: Secondary | ICD-10-CM

## 2023-02-06 LAB — COMPREHENSIVE METABOLIC PANEL
ALT: 23 U/L (ref 0–44)
AST: 22 U/L (ref 15–41)
Albumin: 3.6 g/dL (ref 3.5–5.0)
Alkaline Phosphatase: 68 U/L (ref 38–126)
Anion gap: 14 (ref 5–15)
BUN: 9 mg/dL (ref 8–23)
CO2: 21 mmol/L — ABNORMAL LOW (ref 22–32)
Calcium: 8.8 mg/dL — ABNORMAL LOW (ref 8.9–10.3)
Chloride: 100 mmol/L (ref 98–111)
Creatinine, Ser: 0.69 mg/dL (ref 0.61–1.24)
GFR, Estimated: >60 mL/min (ref >60)
Glucose, Bld: 89 mg/dL (ref 70–99)
Potassium: 3.6 mmol/L (ref 3.5–5.1)
Sodium: 135 mmol/L (ref 135–145)
Total Bilirubin: 0.4 mg/dL (ref 0.3–1.2)
Total Protein: 6.5 g/dL (ref 6.5–8.1)

## 2023-02-06 LAB — URINALYSIS, ROUTINE W REFLEX MICROSCOPIC
Bilirubin Urine: NEGATIVE
Glucose, UA: NEGATIVE mg/dL
Hgb urine dipstick: NEGATIVE
Ketones, ur: NEGATIVE mg/dL
Leukocytes,Ua: NEGATIVE
Nitrite: NEGATIVE
Protein, ur: NEGATIVE mg/dL
Specific Gravity, Urine: 1.014 (ref 1.005–1.030)
pH: 6 (ref 5.0–8.0)

## 2023-02-06 LAB — CBC WITH DIFFERENTIAL/PLATELET
Abs Immature Granulocytes: 0.01 10*3/uL (ref 0.00–0.07)
Basophils Absolute: 0.1 10*3/uL (ref 0.0–0.1)
Basophils Relative: 1 %
Eosinophils Absolute: 0.2 10*3/uL (ref 0.0–0.5)
Eosinophils Relative: 2 %
HCT: 39.5 % (ref 39.0–52.0)
Hemoglobin: 13 g/dL (ref 13.0–17.0)
Immature Granulocytes: 0 %
Lymphocytes Relative: 26 %
Lymphs Abs: 1.7 10*3/uL (ref 0.7–4.0)
MCH: 29.7 pg (ref 26.0–34.0)
MCHC: 32.9 g/dL (ref 30.0–36.0)
MCV: 90.4 fL (ref 80.0–100.0)
Monocytes Absolute: 0.5 10*3/uL (ref 0.1–1.0)
Monocytes Relative: 8 %
Neutro Abs: 4.2 10*3/uL (ref 1.7–7.7)
Neutrophils Relative %: 63 %
Platelets: 284 10*3/uL (ref 150–400)
RBC: 4.37 MIL/uL (ref 4.22–5.81)
RDW: 13.4 % (ref 11.5–15.5)
WBC: 6.7 10*3/uL (ref 4.0–10.5)
nRBC: 0 % (ref 0.0–0.2)

## 2023-02-06 LAB — LIPASE, BLOOD: Lipase: 28 U/L (ref 11–51)

## 2023-02-06 LAB — D-DIMER, QUANTITATIVE: D-Dimer, Quant: 1.75 ug{FEU}/mL — ABNORMAL HIGH (ref 0.00–0.50)

## 2023-02-06 LAB — TROPONIN I (HIGH SENSITIVITY): Troponin I (High Sensitivity): 9 ng/L (ref <18)

## 2023-02-06 MED ORDER — IOHEXOL 350 MG/ML SOLN
75.0000 mL | Freq: Once | INTRAVENOUS | Status: AC | PRN
  Administered 2023-02-06: 75 mL via INTRAVENOUS

## 2023-02-06 MED ORDER — SODIUM CHLORIDE (PF) 0.9 % IJ SOLN
INTRAMUSCULAR | Status: AC
  Filled 2023-02-06: qty 50

## 2023-02-06 NOTE — Telephone Encounter (Signed)
Call placed to [facility] to schedule urgent CT Abdomen Pelvis W contrast and CT Angio chest PE W or WO contrast I spoke with the patient about this. With [facility]. Cathlean Cower confirmed they have both CT request however they are awaiting on PA from his insurance.

## 2023-02-06 NOTE — ED Triage Notes (Signed)
Sharp abdominal pain x 3 days. Denies nausea, vomiting or diarrhea. Reports feeling bloated. Seen here yesterday and [facility] for same complaints but pt left AMA. Reports his d-dimer was high yesterday but he got tired of waiting for CT.

## 2023-02-06 NOTE — ED Notes (Signed)
Pt care taken, here for pain in the upper right quadrant of abdomen. Awaiting md

## 2023-02-06 NOTE — Telephone Encounter (Signed)
Noted! Thank you

## 2023-02-06 NOTE — Telephone Encounter (Signed)
Noted that patient is in the ED at [facility]. CT is being performed there. Please see ED notes

## 2023-02-06 NOTE — ED Provider Notes (Signed)
[place] EMERGENCY DEPARTMENT AT [facility] Provider Note   CSN: [id number] Arrival date & time: 02/06/23  1046     History  Chief Complaint  Patient presents with   Abdominal Pain    Michael [name] is a 62 y.o. male.   Abdominal Pain  Patient reports that 4 days ago he started having sharp stabbing right upper quadrant pain.  He did have bloating associated with this as well as lower extremity edema.  He denies nausea, vomiting, diarrhea, constipation, fever, chills, chest pain, shortness of breath.  He was initially seen for the symptoms yesterday however left prior to completion of CT scan.  He reports that today his symptoms are significantly better however given abnormal D-dimer he was instructed to return to the emergency department.    Home Medications Prior to Admission medications   Medication Sig Start Date End Date Taking? Authorizing Provider  amLODipine (NORVASC) 10 MG tablet Take 1 tablet (10 mg total) by mouth daily. 12/17/22   Marcine Matar, MD  atorvastatin (LIPITOR) 20 MG tablet Take 1 tablet (20 mg total) by mouth daily. 12/17/22   Marcine Matar, MD  hydrALAZINE (APRESOLINE) 25 MG tablet Take 1 tablet (25 mg total) by mouth 2 (two) times daily. 12/17/22   Marcine Matar, MD  lisinopril-hydrochlorothiazide (ZESTORETIC) 20-25 MG tablet Take 1 tablet by mouth daily. 12/17/22   Marcine Matar, MD  carvedilol (COREG) 6.25 MG tablet Take 1 tablet (6.25 mg total) by mouth 2 (two) times daily with a meal. 06/10/19 06/17/19  Marcine Matar, MD      Allergies    Patient has no known allergies.    Review of Systems   Review of Systems  Gastrointestinal:  Positive for abdominal pain.    Physical Exam Updated Vital Signs BP 132/80   Pulse 74   Temp 97.6 F (36.4 C) (Oral)   Resp 17   Ht 5\' 11"  (1.803 m)   Wt 120.2 kg   SpO2 95%   BMI 36.96 kg/m  Physical Exam Vitals and nursing note reviewed.  Constitutional:      General:  He is not in acute distress.    Appearance: He is well-developed.  HENT:     Head: Normocephalic and atraumatic.  Eyes:     Conjunctiva/sclera: Conjunctivae normal.  Cardiovascular:     Rate and Rhythm: Normal rate and regular rhythm.     Heart sounds: No murmur heard. Pulmonary:     Effort: Pulmonary effort is normal. No respiratory distress.     Breath sounds: Normal breath sounds.  Abdominal:     Palpations: Abdomen is soft.     Tenderness: There is abdominal tenderness in the right upper quadrant.  Genitourinary:    Testes:        Right: Swelling present.        Left: Swelling present.  Musculoskeletal:        General: No swelling.     Cervical back: Neck supple.  Skin:    General: Skin is warm and dry.     Capillary Refill: Capillary refill takes less than 2 seconds.  Neurological:     Mental Status: He is alert.  Psychiatric:        Mood and Affect: Mood normal.     ED Results / Procedures / Treatments   Labs (all labs ordered are listed, but only abnormal results are displayed) Labs Reviewed  COMPREHENSIVE METABOLIC PANEL - Abnormal; Notable for the following  components:      Result Value   CO2 21 (*)    Calcium 8.8 (*)    All other components within normal limits  CBC WITH DIFFERENTIAL/PLATELET  LIPASE, BLOOD  URINALYSIS, ROUTINE W REFLEX MICROSCOPIC    EKG None  Radiology CT Angio Chest PE W and/or Wo Contrast  Result Date: 02/06/2023 CLINICAL DATA:  Acute abdominal pain, with shortness of breath and suspected pulmonary embolus. EXAM: CT ANGIOGRAPHY CHEST CT ABDOMEN AND PELVIS WITH CONTRAST TECHNIQUE: Multidetector CT imaging of the chest was performed using the standard protocol during bolus administration of intravenous contrast. Multiplanar CT image reconstructions and MIPs were obtained to evaluate the vascular anatomy. Multidetector CT imaging of the abdomen and pelvis was performed using the standard protocol during bolus administration of  intravenous contrast. RADIATION DOSE REDUCTION: This exam was performed according to the departmental dose-optimization program which includes automated exposure control, adjustment of the mA and/or kV according to patient size and/or use of iterative reconstruction technique. CONTRAST:  75mL OMNIPAQUE IOHEXOL 350 MG/ML SOLN COMPARISON:  Chest radiograph 02/05/2023 FINDINGS: CTA CHEST FINDINGS Cardiovascular: No filling defect is identified in the pulmonary arterial tree to suggest pulmonary embolus. Coronary, aortic arch, and branch vessel atherosclerotic vascular disease. Mediastinum/Nodes: Unremarkable Lungs/Pleura: Airway thickening is present, suggesting bronchitis or reactive airways disease. Musculoskeletal: Mild lower thoracic spondylosis. Review of the MIP images confirms the above findings. CT ABDOMEN and PELVIS FINDINGS Hepatobiliary: Unremarkable Pancreas: Unremarkable Spleen: Unremarkable Adrenals/Urinary Tract: Unremarkable Stomach/Bowel: Small periampullary duodenal diverticulum. Normal appendix. Sigmoid colon diverticulosis. No dilated bowel. Vascular/Lymphatic: Atherosclerosis is present, including aortoiliac atherosclerotic disease. Atheromatous plaque noted proximally in the superior mesenteric artery, without occlusion. Borderline prominent bilateral external iliac lymph nodes, 1.0 cm in short axis on each side. Reproductive: Unremarkable, prostate partially obscured by streak artifact from the left hip implant. Other: No supplemental non-categorized findings. Musculoskeletal: Left total hip prosthesis. Degenerative facet arthropathy at L4-5 with grade 1 anterolisthesis and mild bilateral foraminal impingement resulting at the L4-5 level. 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 Review of the MIP images confirms the above findings. IMPRESSION: 1. No pulmonary embolus is identified. 2. Airway thickening is present, suggesting bronchitis or reactive airways disease. 3. Aortic, coronary, and  systemic atherosclerosis. 4. Borderline prominent bilateral external iliac lymph nodes, 1.0 cm in short axis on each side, nonspecific but most likely benign/reactive. 5. Degenerative facet arthropathy at L4-5 with grade 1 anterolisthesis and mild bilateral foraminal impingement resulting at the L4-5 level. 6. Small Morgagni hernia. 7. Sigmoid colon diverticulosis. Aortic Atherosclerosis (ICD10-I70.0). Electronically Signed   By: Gaylyn Rong M.D.   On: 02/06/2023 17:52   CT ABDOMEN PELVIS W CONTRAST  Result Date: 02/06/2023 CLINICAL DATA:  Acute abdominal pain, with shortness of breath and suspected pulmonary embolus. EXAM: CT ANGIOGRAPHY CHEST CT ABDOMEN AND PELVIS WITH CONTRAST TECHNIQUE: Multidetector CT imaging of the chest was performed using the standard protocol during bolus administration of intravenous contrast. Multiplanar CT image reconstructions and MIPs were obtained to evaluate the vascular anatomy. Multidetector CT imaging of the abdomen and pelvis was performed using the standard protocol during bolus administration of intravenous contrast. RADIATION DOSE REDUCTION: This exam was performed according to the departmental dose-optimization program which includes automated exposure control, adjustment of the mA and/or kV according to patient size and/or use of iterative reconstruction technique. CONTRAST:  75mL OMNIPAQUE IOHEXOL 350 MG/ML SOLN COMPARISON:  Chest radiograph 02/05/2023 FINDINGS: CTA CHEST FINDINGS Cardiovascular: No filling defect is identified in the pulmonary arterial tree to suggest  pulmonary embolus. Coronary, aortic arch, and branch vessel atherosclerotic vascular disease. Mediastinum/Nodes: Unremarkable Lungs/Pleura: Airway thickening is present, suggesting bronchitis or reactive airways disease. Musculoskeletal: Mild lower thoracic spondylosis. Review of the MIP images confirms the above findings. CT ABDOMEN and PELVIS FINDINGS Hepatobiliary: Unremarkable Pancreas:  Unremarkable Spleen: Unremarkable Adrenals/Urinary Tract: Unremarkable Stomach/Bowel: Small periampullary duodenal diverticulum. Normal appendix. Sigmoid colon diverticulosis. No dilated bowel. Vascular/Lymphatic: Atherosclerosis is present, including aortoiliac atherosclerotic disease. Atheromatous plaque noted proximally in the superior mesenteric artery, without occlusion. Borderline prominent bilateral external iliac lymph nodes, 1.0 cm in short axis on each side. Reproductive: Unremarkable, prostate partially obscured by streak artifact from the left hip implant. Other: No supplemental non-categorized findings. Musculoskeletal: Left total hip prosthesis. Degenerative facet arthropathy at L4-5 with grade 1 anterolisthesis and mild bilateral foraminal impingement resulting at the L4-5 level. 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 Review of the MIP images confirms the above findings. IMPRESSION: 1. No pulmonary embolus is identified. 2. Airway thickening is present, suggesting bronchitis or reactive airways disease. 3. Aortic, coronary, and systemic atherosclerosis. 4. Borderline prominent bilateral external iliac lymph nodes, 1.0 cm in short axis on each side, nonspecific but most likely benign/reactive. 5. Degenerative facet arthropathy at L4-5 with grade 1 anterolisthesis and mild bilateral foraminal impingement resulting at the L4-5 level. 6. Small Morgagni hernia. 7. Sigmoid colon diverticulosis. Aortic Atherosclerosis (ICD10-I70.0). Electronically Signed   By: Gaylyn Rong M.D.   On: 02/06/2023 17:52   US Abdomen Limited RUQ (LIVER/GB)  Result Date: 02/05/2023 CLINICAL DATA:  Right upper quadrant abdominal pain EXAM: ULTRASOUND ABDOMEN LIMITED RIGHT UPPER QUADRANT COMPARISON:  None Available. FINDINGS: Gallbladder: No gallstones or wall thickening visualized. No sonographic Murphy sign noted by sonographer. Common bile duct: Diameter: 4 mm, within normal limits. No intrahepatic biliary duct  dilatation. Liver: No focal lesion identified. Increased parenchymal echogenicity. Portal vein is patent on color Doppler imaging with normal direction of blood flow towards the liver. Other: None. IMPRESSION: 1. No acute findings. 2. Hepatic steatosis. Electronically Signed   By: Wiliam Ke M.D.   On: 02/05/2023 15:42   DG Chest 2 View  Result Date: 02/05/2023 CLINICAL DATA:  sob, ruq pain EXAM: CHEST - 2 VIEW COMPARISON:  Chest x-ray 06/26/2022 FINDINGS: The heart and mediastinal contours are within normal limits. No focal consolidation. No pulmonary edema. No pleural effusion. No pneumothorax. No acute osseous abnormality. IMPRESSION: No active cardiopulmonary disease. Electronically Signed   By: Tish Frederickson M.D.   On: 02/05/2023 14:28    Procedures Procedures    Medications Ordered in ED Medications  iohexol (OMNIPAQUE) 350 MG/ML injection 75 mL (75 mLs Intravenous Contrast Given 02/06/23 1607)    ED Course/ Medical Decision Making/ A&P                                 Medical Decision Making  Patient is a 62 year old male with a history of hypertension, hyperlipidemia, prior hepatitis C presenting for abdominal pain.  On my initial evaluation, he is afebrile, hemodynamically stable, in no acute distress.  Reports 4-day history of right-sided abdominal pain with associated bloating that has begun to improve today.  On exam, he does have mild tenderness to palpation in the right upper and mid abdomen without peritoneal signs.  Reviewed patient's results from yesterday.  At that time right upper quadrant ultrasound was without acute findings, decreasing my concern for cholecystitis or cholelithiasis.  Presentation may still reflect appendicitis  or renal pathology, will obtain CT abdomen pelvis to further evaluate.  As he is still having normal bowel movements and exam is not peritonitic, I have less concern for obstruction or perforation.  He does report family history of blood clots,  given elevated dimer, CTA of the chest was ordered.  Results reviewed.  CBC without leukocytosis or anemia.  CMP with overall normal electrolytes, no AKI or anion gap.  UA noninfectious.  CTA of the chest without PE.  CT abdomen pelvis demonstrates small Morgagni hernia, likely reactive lymphadenopathy.  Results discussed with patient.  He does report that his symptoms are overall resolved.  Discussed findings of hernia and recommended outpatient follow-up with surgery, contact information provided for him to arrange this follow-up.  Return precautions including new or worsening pain or inability to tolerate p.o. intake.  Patient reports understanding.  Discharged without further acute event under my care in the emergency department.         Final Clinical Impression(s) / ED Diagnoses Final diagnoses:  Hernia of abdominal cavity  Right upper quadrant abdominal pain    Rx / DC Orders ED Discharge Orders     None         Claretha Cooper, DO 02/06/23 2356    Lorre Nick, MD 02/07/23 [phone]

## 2023-02-06 NOTE — ED Notes (Signed)
Went in to patient and he started ripping off all of his leads and wires. Asked him what he was doing and he said that he was leaving because things were taking too long. Helped him to the bathroom. He came back out and told me that on my chart it said that he was ready for the ct scan at 0030 and he could not understand why he has not been to scan.  Told him that it ment that he was ready for scan but not that ct was ready for him. The then started to rip out his iv which I assisted him with and notified DR Madilyn Hook of this. He would not stay long enough for her to talk to him.

## 2023-02-06 NOTE — ED Provider Notes (Signed)
I saw and evaluated the patient, reviewed the resident's note and I agree with the findings and plan.      62 year old male who presents with recurrent abdominal pain times several days.  Seen yesterday for same and left before his labs are resulted.  He has also been short of breath as well 2.  Return to have his workup completed.  On exam, his abdomen is nonsurgical.  Workup pending at this time.   Lorre Nick, MD 02/06/23 [phone]

## 2023-02-06 NOTE — Telephone Encounter (Signed)
Patient seen in the ER yesterday x 2 with right upper quadrant abdominal pain with some pleuritic component and lower extremity edema.  He had a positive D-dimer.  Plan was to do CAT scan of the abdomen/pelvis and CTA of the chest to evaluate for blood clot.  However patient left AMA both times due to the long wait.  He sent me a MyChart message this morning asking if I would go ahead and order those tests.  I replied to him that there may be some limitations with [place] getting it done urgently but we can try.  Can you please work on trying to get both studies done urgently through [facility] imaging?  Thanks

## 2023-02-07 ENCOUNTER — Encounter: Payer: Self-pay | Admitting: Internal Medicine

## 2023-02-12 ENCOUNTER — Telehealth: Payer: Self-pay

## 2023-02-12 NOTE — Telephone Encounter (Signed)
Spoke with Michael [name].  Informed her that pt already had CT of abdomen/pelvis and CTA of chest in ER.  Advised to cancel the orders that I entered for these studies 02/06/2023.

## 2023-02-12 NOTE — Telephone Encounter (Signed)
Copied from CRM [id number]. Topic: General - Other >> Feb 12, 2023  8:14 AM Everette C wrote: Reason for CRM: Kendal Hymen with [facility] has requested peer to peer contact with a member of clinical staff when possible regarding recent imaging of the patient   The patient's insurance has required additional information and discussion prior to 02/15/23  Reference ID #[id number]  Please contact further when possible [phone]

## 2023-03-03 ENCOUNTER — Other Ambulatory Visit (HOSPITAL_COMMUNITY): Payer: Self-pay | Admitting: General Surgery

## 2023-03-03 DIAGNOSIS — R1011 Right upper quadrant pain: Secondary | ICD-10-CM

## 2023-03-05 ENCOUNTER — Ambulatory Visit (HOSPITAL_COMMUNITY)
Admission: RE | Admit: 2023-03-05 | Discharge: 2023-03-05 | Disposition: A | Discharge status: Home / Self Care | Payer: Medicare HMO | Source: Ambulatory Visit | Attending: General Surgery | Admitting: General Surgery

## 2023-03-05 DIAGNOSIS — R1011 Right upper quadrant pain: Secondary | ICD-10-CM | POA: Diagnosis present

## 2023-03-05 MED ORDER — TECHNETIUM TC 99M MEBROFENIN IV KIT
5.2000 | PACK | Freq: Once | INTRAVENOUS | Status: AC | PRN
  Administered 2023-03-05: 5.2 via INTRAVENOUS

## 2023-04-09 NOTE — Addendum Note (Signed)
Encounter addended by: Merideth Abbey on: 04/09/2023 8:32 AM  Actions taken: Imaging Exam ended, Charge Capture section accepted

## 2023-04-22 ENCOUNTER — Ambulatory Visit: Payer: Self-pay | Admitting: Internal Medicine

## 2023-05-06 ENCOUNTER — Ambulatory Visit: Payer: Medicare HMO | Admitting: Podiatry

## 2023-05-06 ENCOUNTER — Encounter: Payer: Self-pay | Admitting: Podiatry

## 2023-05-06 DIAGNOSIS — M778 Other enthesopathies, not elsewhere classified: Secondary | ICD-10-CM

## 2023-05-06 DIAGNOSIS — M722 Plantar fascial fibromatosis: Secondary | ICD-10-CM | POA: Diagnosis not present

## 2023-05-06 MED ORDER — TRIAMCINOLONE ACETONIDE 40 MG/ML IJ SUSP
20.0000 mg | Freq: Once | INTRAMUSCULAR | Status: AC
Start: 2023-05-06 — End: 2023-05-06
  Administered 2023-05-06: 20 mg

## 2023-05-06 NOTE — Progress Notes (Signed)
He presents today states he is here for follow-up of capsulitis of the midfoot have not seen him since January of this year.  He states that his heel is hurting as well.  Objective: Vital signs are stable he is alert and oriented x 3 pulses are palpable.  He has pain on palpation dorsal aspect of the right foot at the TMT joints as well as on palpation and medial calcaneal tubercle right heel.  Assessment: Plantar fasciitis right heel.  Osteoarthritis capsulitis dorsal aspect right foot.  Plan: After sterile Betadine skin prep pad injected 10 mg of Kenalog to the dorsal aspect of the foot and 10 mg of Kenalog to the plantar aspect of the foot at the insertion of the plantar fascia on the heel.  Tolerated procedure well we will follow-up with me on as-needed basis.

## 2023-06-09 ENCOUNTER — Other Ambulatory Visit: Payer: Self-pay | Admitting: Internal Medicine

## 2023-06-09 DIAGNOSIS — I10 Essential (primary) hypertension: Secondary | ICD-10-CM

## 2023-06-20 ENCOUNTER — Encounter: Payer: Self-pay | Admitting: General Surgery

## 2023-08-28 ENCOUNTER — Ambulatory Visit: Admission: EM | Admit: 2023-08-28 | Discharge: 2023-08-28 | Disposition: A | Discharge status: Home / Self Care

## 2023-08-28 ENCOUNTER — Encounter: Payer: Self-pay | Admitting: Emergency Medicine

## 2023-08-28 DIAGNOSIS — L0291 Cutaneous abscess, unspecified: Secondary | ICD-10-CM | POA: Diagnosis not present

## 2023-08-28 MED ORDER — DOXYCYCLINE HYCLATE 100 MG PO CAPS
100.0000 mg | ORAL_CAPSULE | Freq: Two times a day (BID) | ORAL | 0 refills | Status: AC
Start: 2023-08-28 — End: 2023-09-04

## 2023-08-28 NOTE — Discharge Instructions (Signed)
  Use warm compresses to promote drainage. Follow up if no gradual improvement or with any further concerns or persistent symptoms.

## 2023-08-28 NOTE — ED Provider Notes (Signed)
 [facility]    CSN: [id number] Arrival date & time: 08/28/23  0804      History   Chief Complaint Chief Complaint  Patient presents with   Abscess   Diarrhea    fe   Fever    HPI Michael [name] is a 63 y.o. male.   Patient here today for evaluation of abscess that is been present to his left upper back for the last 2 to 3 weeks.  He reports that he is just started to not feel well over the last several days with fevers at times as well as diarrhea.  He reports that Tmax has been 100 and this was last week.  He does note that he was able to "pop" the abscess over the weekend and this helped with the pain somewhat.  The history is provided by the patient.  Abscess Associated symptoms: fever   Diarrhea Associated symptoms: fever   Associated symptoms: no chills   Fever Associated symptoms: diarrhea   Associated symptoms: no chills     Past Medical History:  Diagnosis Date   Arthritis    Hepatitis    Hep C rx took treatment.   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   Right rotator cuff tear 01/29/2013    Past Surgical History:  Procedure Laterality Date   COLONOSCOPY     left knee arthroscopy      left rotator cuff surgery      REVISION TOTAL KNEE ARTHROPLASTY Left 06/15/2018   SHOULDER OPEN ROTATOR CUFF REPAIR Right 01/29/2013   Procedure: RIGHT SHOULDER MINI ROTATOR CUFF  REPAIR;  Surgeon: Javier Docker, MD;  Location: [facility];  Service: Orthopedics;  Laterality: Right;  With ANCHORS   TOTAL HIP ARTHROPLASTY Left 06/08/2020   Procedure: TOTAL HIP ARTHROPLASTY ANTERIOR APPROACH;  Surgeon: Samson Frederic, MD;  Location: [facility];  Service: Orthopedics;  Laterality: Left;   TOTAL KNEE ARTHROPLASTY Left 02/12/2017   TOTAL KNEE ARTHROPLASTY Left 02/12/2017   Procedure: LEFT TOTAL KNEE ARTHROPLASTY;  Surgeon: Tarry Kos, MD;  [facility];  Service: Orthopedics;  Laterality: Left;   TOTAL KNEE REVISION Left 06/15/2018   Procedure: LEFT TOTAL KNEE REVISION;  Surgeon: Tarry Kos, MD;  [facility];  Service: Orthopedics;  Laterality: Left;   TOTAL KNEE REVISION Left 03/08/2019   Procedure: LEFT TOTAL KNEE REVISION;  Surgeon: Tarry Kos, MD;  [facility];  Service: Orthopedics;  Laterality: Left;   TOTAL KNEE REVISION Left 09/27/2020   Procedure: Left total knee arthroplasty revision;  Surgeon: Samson Frederic, MD;  Location: [facility];  Service: Orthopedics;  Laterality: Left;       Home Medications    Prior to Admission medications   Medication Sig Start Date End Date Taking? Authorizing Provider  amLODipine (NORVASC) 10 MG tablet Take 1 tablet (10 mg total) by mouth daily. 12/17/22  Yes Marcine Matar, MD  atorvastatin (  LIPITOR) 20 MG tablet Take 1 tablet (20 mg total) by mouth daily. 12/17/22  Yes Marcine Matar, MD  Buprenorphine HCl-Naloxone HCl 8-2 MG FILM Place under the tongue 2 (two) times daily. 06/05/23  Yes [provider]  doxycycline (VIBRAMYCIN) 100 MG capsule Take 1 capsule (100 mg total) by mouth 2 (two) times daily for 7 days. 08/28/23 09/04/23 Yes Tomi Bamberger, PA-C  hydrALAZINE (APRESOLINE) 25 MG tablet TAKE 1 TABLET BY MOUTH TWICE A DAY 06/09/23  Yes Marcine Matar, MD  lisinopril-hydrochlorothiazide (ZESTORETIC) 20-25 MG tablet Take 1 tablet by mouth daily. 12/17/22  Yes Marcine Matar, MD  pantoprazole  (PROTONIX) 20 MG tablet Take 20 mg by mouth daily. 03/12/23  Yes [provider]  pantoprazole (PROTONIX) 20 MG tablet Take 20 mg by mouth daily. 06/08/23  Yes [provider]  psyllium (REGULOID) 0.52 g capsule Take 0.52 g by mouth daily.   Yes [provider]  varenicline (CHANTIX) 0.5 MG tablet Take 0.5 mg by mouth as directed. 05/23/23  Yes [provider]  varenicline (CHANTIX) 1 MG tablet Take 1 mg by mouth 2 (two) times daily.   Yes [provider]  carvedilol (COREG) 6.25 MG tablet Take 1 tablet (6.25 mg total) by mouth 2 (two) times daily with a meal. 06/10/19 06/17/19  Marcine Matar, MD    Family History Family History  Problem Relation Age of Onset   CAD Mother    Deep vein thrombosis Brother     Social History Social History   Tobacco Use   Smoking status: Some Days    Current packs/day: 0.00    Average packs/day: 1 pack/day for 37.0 years (37.0 ttl pk-yrs)    Types: Cigarettes    Start date: 04/21/1983    Last attempt to quit: 04/20/2020    Years since quitting: 3.3   Smokeless tobacco: Never   Tobacco comments:    Occasionally will smoke one  Vaping Use   Vaping status: Never Used  Substance Use Topics   Alcohol use: Yes    Comment: occasional   Drug use: No    Comment: hx of marijuana and cocaine, LSD     Allergies   Patient has no known allergies.   Review of Systems Review of Systems  Constitutional:  Positive for fever. Negative for chills.  Eyes:  Negative for discharge and redness.  Gastrointestinal:  Positive for diarrhea.  Skin:  Positive for color change and wound.  Neurological:  Negative for numbness.     Physical Exam Triage Vital Signs ED Triage Vitals  Encounter Vitals Group     BP 08/28/23 0819 (!) 149/80     Systolic BP Percentile --      Diastolic BP Percentile --      Pulse Rate 08/28/23 0819 85     Resp 08/28/23 0819 18     Temp 08/28/23 0819 98.3 F (36.8 C)     Temp Source  08/28/23 0819 Oral     SpO2 08/28/23 0819 95 %     Weight --      Height --      Head Circumference --      Peak Flow --      Pain Score 08/28/23 0820 0     Pain Loc --      Pain Education --      Exclude from Growth Chart --    No data found.  Updated Vital Signs BP (!) 149/80 (BP Location:  Left Arm)   Pulse 85   Temp 98.3 F (36.8 C) (Oral)   Resp 18   SpO2 95%   Visual Acuity Right Eye Distance:   Left Eye Distance:   Bilateral Distance:    Right Eye Near:   Left Eye Near:    Bilateral Near:     Physical Exam Vitals and nursing note reviewed.  Constitutional:      General: He is not in acute distress.    Appearance: Normal appearance. He is not ill-appearing.  HENT:     Head: Normocephalic and atraumatic.  Eyes:     Conjunctiva/sclera: Conjunctivae normal.  Cardiovascular:     Rate and Rhythm: Normal rate.  Pulmonary:     Effort: Pulmonary effort is normal. No respiratory distress.  Skin:    Comments: Area of induration with moderate purulent drainage noted to left upper back  Neurological:     Mental Status: He is alert.  Psychiatric:        Mood and Affect: Mood normal.        Behavior: Behavior normal.        Thought Content: Thought content normal.      UC Treatments / Results  Labs (all labs ordered are listed, but only abnormal results are displayed) Labs Reviewed - No data to display  EKG   Radiology No results found.  Procedures Procedures (including critical care time)  Medications Ordered in UC Medications - No data to display  Initial Impression / Assessment and Plan / UC Course  I have reviewed the triage vital signs and the nursing notes.  Pertinent labs & imaging results that were available during my care of the patient were reviewed by me and considered in my medical decision making (see chart for details).    Given questionable systemic symptoms we will treat with doxycycline, no fever in office, normal vitals, less  suspicion for sepsis.  Advise further evaluation in the emergency department if no gradual improvement or with any worsening.  Final Clinical Impressions(s) / UC Diagnoses   Final diagnoses:  Abscess   Discharge Instructions   None    ED Prescriptions     Medication Sig Dispense Auth. Provider   doxycycline (VIBRAMYCIN) 100 MG capsule Take 1 capsule (100 mg total) by mouth 2 (two) times daily for 7 days. 14 capsule Tomi Bamberger, PA-C      PDMP not reviewed this encounter.   Tomi Bamberger, PA-C 08/28/23 [phone]

## 2023-08-28 NOTE — ED Triage Notes (Signed)
 Pt reports abscess to middle of upper back x2-3 weeks. Diarrhea x2-3 days. 2 loose stools in last 24hrs. Intermittent fevers x4-5 days. Max temp: 100, last Thurs. Pt reports messing with the abscess this weekend and trying to pop it. Still having pain and reports drainage to area. Bandaid covering site now.

## 2023-11-21 ENCOUNTER — Ambulatory Visit
Admission: EM | Admit: 2023-11-21 | Discharge: 2023-11-21 | Disposition: A | Discharge status: Home / Self Care | Attending: Family Medicine | Admitting: Family Medicine

## 2023-11-21 ENCOUNTER — Encounter: Payer: Self-pay | Admitting: Emergency Medicine

## 2023-11-21 DIAGNOSIS — S0010XA Contusion of unspecified eyelid and periocular area, initial encounter: Secondary | ICD-10-CM

## 2023-11-21 DIAGNOSIS — H2102 Hyphema, left eye: Secondary | ICD-10-CM | POA: Diagnosis not present

## 2023-11-21 DIAGNOSIS — S0502XA Injury of conjunctiva and corneal abrasion without foreign body, left eye, initial encounter: Secondary | ICD-10-CM | POA: Diagnosis not present

## 2023-11-21 DIAGNOSIS — H5712 Ocular pain, left eye: Secondary | ICD-10-CM | POA: Diagnosis not present

## 2023-11-21 MED ORDER — ERYTHROMYCIN 5 MG/GM OP OINT: TOPICAL_OINTMENT | Freq: Four times a day (QID) | OPHTHALMIC | 0 refills | Status: DC

## 2023-11-21 NOTE — Discharge Instructions (Addendum)
 Please start erythromycin ophthalmic ointment to help with antibiotic properties and eye protection of the left eye.  Follow-up with Dr. Maree, the eye doctor on-call, for recheck urgently especially given your hyphema.  If you develop severe eye pain, vision loss, worsening bleeding with the eye then present to the emergency room.

## 2023-11-21 NOTE — ED Provider Notes (Signed)
 [facility]  Note:  This document was prepared using Conservation officer, historic buildings and may include unintentional dictation errors.  MRN: [id number] DOB: 05/07/1961  Subjective:   Michael [name] is a 62 y.o. male presenting for an evaluation following an injury to the face and left eye.  Patient reports that he was trying to use 2 bungee cords and lost control of them.  They ended up retracting back and making impact against his face and in particular his eye on the left side.  He has since noted some blurred vision, swelling of the eyelid and abrasion to the face.  No headache, confusion, photophobia, pain with eye movement, active bleeding.  Tdap is up-to-date.  No current facility-administered medications for this encounter.  Current Outpatient Medications:    amLODipine  (NORVASC ) 10 MG tablet, Take 1 tablet (10 mg total) by mouth daily., Disp: 90 tablet, Rfl: 1   atorvastatin  (LIPITOR) 20 MG tablet, Take 1 tablet (20 mg total) by mouth daily., Disp: 90 tablet, Rfl: 1   hydrALAZINE  (APRESOLINE ) 25 MG tablet, TAKE 1 TABLET BY MOUTH TWICE A DAY, Disp: 60 tablet, Rfl: 0   lisinopril  (ZESTRIL ) 20 MG tablet, Take 20 mg by mouth., Disp: , Rfl:    psyllium (REGULOID) 0.52 g capsule, Take 0.52 g by mouth daily., Disp: , Rfl:    torsemide (DEMADEX) 10 MG tablet, Take 10 mg by mouth., Disp: , Rfl:    albuterol  (VENTOLIN  HFA) 108 (90 Base) MCG/ACT inhaler, Inhale 2 puffs into the lungs every 6 (six) hours as needed., Disp: , Rfl:    meloxicam  (MOBIC ) 15 MG tablet, Take 15 mg by mouth daily. (Patient not taking: Reported on 11/21/2023), Disp: , Rfl:    pantoprazole (PROTONIX) 20 MG tablet, Take 20 mg by mouth daily. (Patient not taking: Reported on 11/21/2023), Disp: , Rfl:    pantoprazole (PROTONIX) 20 MG tablet, Take 20 mg by mouth daily. (Patient not taking: Reported on 11/21/2023), Disp: , Rfl:    varenicline  (CHANTIX ) 0.5 MG tablet, Take 0.5 mg by mouth as directed.  (Patient not taking: Reported on 11/21/2023), Disp: , Rfl:    varenicline  (CHANTIX ) 1 MG tablet, Take 1 mg by mouth 2 (two) times daily. (Patient not taking: Reported on 11/21/2023), Disp: , Rfl:    No Known Allergies  Past Medical History:  Diagnosis Date   Arthritis    Hepatitis    Hep C rx took treatment.   Hypertension    Pre-diabetes      Past Surgical History:  Procedure Laterality Date   COLONOSCOPY     left knee arthroscopy      left rotator cuff surgery      REVISION TOTAL KNEE ARTHROPLASTY Left 06/15/2018   SHOULDER OPEN ROTATOR CUFF REPAIR Right 01/29/2013   Procedure: RIGHT SHOULDER MINI ROTATOR CUFF REPAIR;  Surgeon: Reyes JAYSON Billing, MD;  Location: [facility];  Service: Orthopedics;  Laterality: Right;  With ANCHORS   TOTAL HIP ARTHROPLASTY Left 06/08/2020   Procedure: TOTAL HIP ARTHROPLASTY ANTERIOR APPROACH;  Surgeon: Fidel Rogue, MD;  Location: [facility];  Service: Orthopedics;  Laterality: Left;   TOTAL KNEE ARTHROPLASTY Left 02/12/2017   TOTAL KNEE ARTHROPLASTY Left 02/12/2017   Procedure: LEFT TOTAL KNEE ARTHROPLASTY;  Surgeon: Jerri Kay HERO, MD;  [facility];  Service: Orthopedics;  Laterality: Left;   TOTAL KNEE REVISION Left 06/15/2018   Procedure: LEFT TOTAL KNEE REVISION;  Surgeon: Jerri Kay HERO, MD;  Location: [facility];  Service:  Orthopedics;  Laterality: Left;   TOTAL KNEE REVISION Left 03/08/2019   Procedure: LEFT TOTAL KNEE REVISION;  Surgeon: Jerri Kay HERO, MD;  [facility];  Service: Orthopedics;  Laterality: Left;   TOTAL KNEE REVISION Left 09/27/2020   Procedure: Left total knee arthroplasty revision;  Surgeon: Fidel Rogue, MD;  Location: [facility];  Service: Orthopedics;  Laterality: Left;    Family History  Problem Relation Age of Onset   CAD Mother    Deep vein thrombosis Brother     Social History   Tobacco Use   Smoking status: Every Day    Current packs/day: 0.00    Average packs/day: 1 pack/day for 37.0 years (37.0 ttl pk-yrs)    Types:  Cigarettes    Start date: 04/21/1983    Last attempt to quit: 04/20/2020    Years since quitting: 3.5    Passive exposure: Current   Smokeless tobacco: Never   Tobacco comments:    Occasionally will smoke one  Vaping Use   Vaping status: Never Used  Substance Use Topics   Alcohol  use: Yes    Comment: occasional   Drug use: Not Currently    Comment: hx of marijuana and cocaine, LSD    ROS   Objective:   Vitals: BP 135/88 (BP Location: Left Arm)   Pulse 85   Temp 97.7 F (36.5 C) (Oral)   Resp 18   SpO2 96%   Physical Exam Constitutional:      General: He is not in acute distress.    Appearance: Normal appearance. He is well-developed and normal weight. He is not ill-appearing, toxic-appearing or diaphoretic.  HENT:     Head: Normocephalic and atraumatic.     Right Ear: Tympanic membrane, ear canal and external ear normal. No drainage, swelling or tenderness. No middle ear effusion. There is no impacted cerumen. Tympanic membrane is not erythematous or bulging.     Left Ear: Tympanic membrane, ear canal and external ear normal. No drainage, swelling or tenderness.  No middle ear effusion. There is no impacted cerumen. Tympanic membrane is not erythematous or bulging.     Nose: Nose normal. No congestion or rhinorrhea.     Mouth/Throat:     Mouth: Mucous membranes are moist.     Pharynx: Oropharynx is clear. No oropharyngeal exudate or posterior oropharyngeal erythema.   Eyes:     General: Lids are everted, no foreign bodies appreciated. No scleral icterus.       Right eye: No foreign body, discharge or hordeolum.        Left eye: No foreign body, discharge or hordeolum.     Extraocular Movements: Extraocular movements intact.     Conjunctiva/sclera:     Right eye: Right conjunctiva is not injected. No chemosis, exudate or hemorrhage.    Left eye: Left conjunctiva is injected. Hemorrhage (light hyphema noted of the left eye) present. No chemosis or  exudate.    Cardiovascular:     Rate and Rhythm: Normal rate.  Pulmonary:     Effort: Pulmonary effort is normal.   Musculoskeletal:     Cervical back: Normal range of motion and neck supple. No rigidity. No muscular tenderness.   Neurological:     General: No focal deficit present.     Mental Status: He is alert and oriented to person, place, and time.   Psychiatric:        Mood and Affect: Mood normal.        Behavior: Behavior normal.  Thought Content: Thought content normal.        Judgment: Judgment normal.    Eye Exam: Eyelids everted and swept for foreign body. The eye was stained with fluorescein. Examination under woods lamp does reveal increased stain uptake as outlined on the diagram. The eye was then irrigated copiously with saline.   Assessment and Plan :   PDMP not reviewed this encounter.  1. Abrasion of left cornea, initial encounter   2. Contusion of eyelid and periocular area, initial encounter   3. Acute left eye pain   4. Hyphema of left eye    Recommended erythromycin ophthalmic ointment, eye protection and urgent follow-up with the ophthalmologist on-call through [facility], Dr. Maree.  Low suspicion for ocular fracture or an acute ophthalmologic emergency.  Counseled patient on potential for adverse effects with medications prescribed/recommended today, ER and return-to-clinic precautions discussed, patient verbalized understanding.    Christopher Savannah, [place] 11/21/23 [phone]

## 2023-11-21 NOTE — ED Triage Notes (Signed)
 Pt reports facial injury that occurred ~1hr ago when bungee cord snapped back and hit him in the face. Area is no longer bleeding, but L eyelid has significant swelling. Busted lip with abrasions to nose and L cheek. Has not applied ice or heat to area. Vision is blurred in L eye.

## 2024-01-06 ENCOUNTER — Ambulatory Visit: Payer: Medicare HMO

## 2024-01-17 ENCOUNTER — Encounter (INDEPENDENT_AMBULATORY_CARE_PROVIDER_SITE_OTHER): Payer: Self-pay

## 2024-01-25 NOTE — Progress Notes (Deleted)
 Cardiology Office Note:    Date:  01/25/2024   ID:  Michael [name], DOB 12-05-1960, MRN [id number]  PCP:  Patient, No Pcp Per  Cardiologist:  None  Electrophysiologist:  None   Referring MD: Venson Candis CROME, NP   No chief complaint on file. ***  History of Present Illness:    Michael [name] is a 63 y.o. male with a hx of hypertension, hyperlipidemia, tobacco use, HCV who is referred by Candis Venson, NP for evaluation of hypertension.  Past Medical History:  Diagnosis Date   Arthritis    Dermatitis    Fatigue    Hepatitis    Hep C rx took treatment.   HLD (hyperlipidemia)    HTN (hypertension)    Hypertension    Morgagni hernia    Osteoarthritis    Polyosteoarthritis    Pre-diabetes    Sebaceous cyst    Tobacco dependence     Past Surgical History:  Procedure Laterality Date   COLONOSCOPY     left knee arthroscopy      left rotator cuff surgery      REVISION TOTAL KNEE ARTHROPLASTY Left 06/15/2018   SHOULDER OPEN ROTATOR CUFF REPAIR Right 01/29/2013   Procedure: RIGHT SHOULDER MINI ROTATOR CUFF REPAIR;  Surgeon: Reyes JAYSON Billing, MD;  Location: [facility];  Service: Orthopedics;  Laterality: Right;  With ANCHORS   TOTAL HIP ARTHROPLASTY Left 06/08/2020   Procedure: TOTAL HIP ARTHROPLASTY ANTERIOR APPROACH;  Surgeon: Fidel Rogue, MD;  [facility];  Service: Orthopedics;  Laterality: Left;   TOTAL KNEE ARTHROPLASTY Left 02/12/2017   TOTAL KNEE ARTHROPLASTY Left 02/12/2017   Procedure: LEFT TOTAL KNEE ARTHROPLASTY;  Surgeon: Jerri Kay HERO, MD;  [facility];  Service: Orthopedics;  Laterality: Left;   TOTAL KNEE REVISION Left 06/15/2018   Procedure: LEFT TOTAL KNEE REVISION;  Surgeon: Jerri Kay HERO, MD;  [facility];  Service: Orthopedics;  Laterality: Left;   TOTAL KNEE REVISION Left 03/08/2019   Procedure: LEFT TOTAL KNEE REVISION;  Surgeon: Jerri Kay HERO, MD;  [facility];  Service: Orthopedics;  Laterality: Left;   TOTAL KNEE REVISION Left 09/27/2020    Procedure: Left total knee arthroplasty revision;  Surgeon: Fidel Rogue, MD;  Location: [facility];  Service: Orthopedics;  Laterality: Left;    Current Medications: No outpatient medications have been marked as taking for the 01/29/24 encounter (Appointment) with Kate Lonni CROME, MD.     Allergies:   Patient has no known allergies.   Social History   Socioeconomic History   Marital status: Single    Spouse name: Not on file   Number of children: 0   Years of education: 12   Highest education level: GED or equivalent  Occupational History   Occupation: Chartered certified accountant  Tobacco Use   Smoking status: Every Day    Current packs/day: 0.00    Average packs/day: 1 pack/day for 37.0 years (37.0 ttl pk-yrs)    Types: Cigarettes    Start date: 04/21/1983    Last attempt to quit: 04/20/2020    Years since quitting: 3.7    Passive exposure: Current   Smokeless tobacco: Never   Tobacco comments:    Occasionally will smoke one  Vaping Use   Vaping status: Never Used  Substance and Sexual Activity   Alcohol  use: Yes    Comment: occasional   Drug use: Not Currently    Comment: hx of marijuana and cocaine, LSD   Sexual activity: Yes  Partners: Female    Birth control/protection: None  Other Topics Concern   Not on file  Social History Narrative   Not on file   Social Drivers of Health   Financial Resource Strain: Low Risk  (01/03/2023)   Overall Financial Resource Strain (CARDIA)    Difficulty of Paying Living Expenses: Not hard at all  Recent Concern: Financial Resource Strain - Medium Risk (12/16/2022)   Overall Financial Resource Strain (CARDIA)    Difficulty of Paying Living Expenses: Somewhat hard  Food Insecurity: No Food Insecurity (01/03/2023)   Hunger Vital Sign    Worried About Running Out of Food in the Last Year: Never true    Ran Out of Food in the Last Year: Never true  Recent Concern: Food Insecurity - Food Insecurity Present (12/16/2022)   Hunger Vital Sign     Worried About Running Out of Food in the Last Year: Sometimes true    Ran Out of Food in the Last Year: Sometimes true  Transportation Needs: No Transportation Needs (01/03/2023)   PRAPARE - Administrator, Civil Service (Medical): No    Lack of Transportation (Non-Medical): No  Physical Activity: Sufficiently Active (01/03/2023)   Exercise Vital Sign    Days of Exercise per Week: 4 days    Minutes of Exercise per Session: 60 min  Stress: No Stress Concern Present (01/03/2023)   [employer] of Occupational Health - Occupational Stress Questionnaire    Feeling of Stress : Not at all  Social Connections: Moderately Isolated (01/03/2023)   Social Connection and Isolation Panel    Frequency of Communication with Friends and Family: More than three times a week    Frequency of Social Gatherings with Friends and Family: Never    Attends Religious Services: Never    Database administrator or Organizations: No    Attends Engineer, structural: More than 4 times per year    Marital Status: Never married     Family History: The patient's ***family history includes CAD in his mother; Deep vein thrombosis in his brother.  ROS:   Please see the history of present illness.    *** All other systems reviewed and are negative.  EKGs/Labs/Other Studies Reviewed:    The following studies were reviewed today: ***  EKG:  EKG is *** ordered today.  The ekg ordered today demonstrates ***  Recent Labs: 02/05/2023: B Natriuretic Peptide 18.5 02/06/2023: ALT 23; BUN 9; Creatinine, Ser 0.69; Hemoglobin 13.0; Platelets 284; Potassium 3.6; Sodium 135  Recent Lipid Panel    Component Value Date/Time   CHOL 166 12/19/2022 1349   TRIG 179 (H) 12/19/2022 1349   HDL 57 12/19/2022 1349   CHOLHDL 2.9 12/19/2022 1349   LDLCALC 79 12/19/2022 1349    Physical Exam:    VS:  There were no vitals taken for this visit.    Wt Readings from Last 3 Encounters:  02/06/23 264 lb 15.9 oz  (120.2 kg)  02/05/23 265 lb (120.2 kg)  01/03/23 272 lb (123.4 kg)     GEN: *** Well nourished, well developed in no acute distress HEENT: Normal NECK: No JVD; No carotid bruits LYMPHATICS: No lymphadenopathy CARDIAC: ***RRR, no murmurs, rubs, gallops RESPIRATORY:  Clear to auscultation without rales, wheezing or rhonchi  ABDOMEN: Soft, non-tender, non-distended MUSCULOSKELETAL:  No edema; No deformity  SKIN: Warm and dry NEUROLOGIC:  Alert and oriented x 3 PSYCHIATRIC:  Normal affect   ASSESSMENT:    No diagnosis found. PLAN:  Hypertension: On lisinopril  20 mg daily, amlodipine  10 mg daily, hydralazine  25 mg twice daily, torsemide 10 mg daily  Hyperlipidemia: On atorvastatin  10 mg daily  Tobacco use:  RTC in***   Medication Adjustments/Labs and Tests Ordered: Current medicines are reviewed at length with the patient today.  Concerns regarding medicines are outlined above.  No orders of the defined types were placed in this encounter.  No orders of the defined types were placed in this encounter.   There are no Patient Instructions on file for this visit.   Signed, Lonni LITTIE Nanas, MD  01/25/2024 4:32 PM    [facility]

## 2024-01-29 ENCOUNTER — Ambulatory Visit: Admitting: Cardiology

## 2024-01-30 ENCOUNTER — Ambulatory Visit (INDEPENDENT_AMBULATORY_CARE_PROVIDER_SITE_OTHER)

## 2024-01-30 ENCOUNTER — Encounter (HOSPITAL_COMMUNITY): Payer: Self-pay | Admitting: *Deleted

## 2024-01-30 ENCOUNTER — Encounter: Payer: Self-pay | Admitting: Cardiology

## 2024-01-30 ENCOUNTER — Ambulatory Visit: Attending: Cardiology | Admitting: Cardiology

## 2024-01-30 VITALS — BP 130/74 | HR 108 | Resp 16 | Ht 71.0 in | Wt 263.0 lb

## 2024-01-30 DIAGNOSIS — I499 Cardiac arrhythmia, unspecified: Secondary | ICD-10-CM

## 2024-01-30 DIAGNOSIS — F17210 Nicotine dependence, cigarettes, uncomplicated: Secondary | ICD-10-CM

## 2024-01-30 DIAGNOSIS — R0609 Other forms of dyspnea: Secondary | ICD-10-CM | POA: Diagnosis not present

## 2024-01-30 DIAGNOSIS — R000 Tachycardia, unspecified: Secondary | ICD-10-CM | POA: Diagnosis not present

## 2024-01-30 DIAGNOSIS — I10 Essential (primary) hypertension: Secondary | ICD-10-CM

## 2024-01-30 DIAGNOSIS — I2510 Atherosclerotic heart disease of native coronary artery without angina pectoris: Secondary | ICD-10-CM | POA: Insufficient documentation

## 2024-01-30 DIAGNOSIS — I25118 Atherosclerotic heart disease of native coronary artery with other forms of angina pectoris: Secondary | ICD-10-CM | POA: Diagnosis not present

## 2024-01-30 MED ORDER — NITROGLYCERIN 0.4 MG SL SUBL: 0.4000 mg | SUBLINGUAL_TABLET | SUBLINGUAL | 1 refills | Status: AC | PRN

## 2024-01-30 MED ORDER — ASPIRIN 81 MG PO TBEC: 81.0000 mg | DELAYED_RELEASE_TABLET | Freq: Every day | ORAL | 3 refills | Status: AC

## 2024-01-30 NOTE — Progress Notes (Signed)
 Cardiology Office Note:  .   Date:  01/30/2024  ID:  Michael [name], DOB 1960/06/29, MRN [id number] PCP: Patient, No Pcp Per  [facility] Providers Cardiologist:  Michael Lawrence, MD PCP: Patient, No Pcp Per  Chief Complaint  Patient presents with   Hypertension   New Patient (Initial Visit)     Michael [name] is a 63 y.o. male with hypertension, hyperlipidemia, tobacco use, HCV, coronary atherosclerosis, nicotine  dependence  Discussed the use of AI scribe software for clinical note transcription with the patient, who gave verbal consent to proceed.  History of Present Illness Michael [name] is a 63 year old male with hypertension who presents with fatigue and shortness of breath.  He experiences persistent fatigue for the past year and shortness of breath, particularly when walking, which improves with rest. He was prescribed an inhaler but finds it ineffective and rarely uses it. There is no orthopnea or paroxysmal nocturnal dyspnea.  He restarted smoking in February and currently smokes about half a pack a day. He has a history of high blood pressure, generally well-controlled with amlodipine , hydralazine , lisinopril , and torsemide.  He notes swelling in both legs, particularly noticeable during a recent trip. He has been checked for blood clots in the past due to leg swelling and pain.  He mentions occasional chest pain, attributed to indigestion, lasting only seconds. He has a family history of heart disease, with his mother dying of a heart attack at 62 and a brother with blood clots.  He has experienced rapid heartbeats and occasional palpitations. A CT scan last year showed some plaque buildup on his aorta and coronary arteries.      Vitals:   01/30/24 1116  BP: 130/74  Pulse: (!) 108  Resp: 16  SpO2: 95%      Review of Systems  Cardiovascular:  Positive for chest pain and dyspnea on exertion. Negative for leg swelling, palpitations and  syncope.        Studies Reviewed: SABRA        EKG 01/30/2024: Sinus tachycardia 109 bpm with Premature atrial complexes When compared with ECG of 05-Feb-2023 23:46,  rate is faster     CTA chest 01/2023: 1. No pulmonary embolus is identified. 2. Airway thickening is present, suggesting bronchitis or reactive airways disease. 3. Aortic, coronary, and systemic atherosclerosis. 4. Borderline prominent bilateral external iliac lymph nodes, 1.0 cm in short axis on each side, nonspecific but most likely benign/reactive. 5. Degenerative facet arthropathy at L4-5 with grade 1 anterolisthesis and mild bilateral foraminal impingement resulting at the L4-5 level. 6. Small Morgagni hernia. 7. Sigmoid colon diverticulosis.   Aortic Atherosclerosis  Physical Exam Vitals and nursing note reviewed.  Constitutional:      General: He is not in acute distress. Neck:     Vascular: No JVD.  Cardiovascular:     Rate and Rhythm: Regular rhythm. Tachycardia present.     Heart sounds: Normal heart sounds. No murmur heard. Pulmonary:     Effort: Pulmonary effort is normal.     Breath sounds: Normal breath sounds. No wheezing or rales.  Musculoskeletal:     Right lower leg: No edema.     Left lower leg: No edema.      VISIT DIAGNOSES:   ICD-10-CM   1. Primary hypertension  I10 EKG 12-Lead    2. Coronary artery disease involving native coronary artery of native heart with other form of angina pectoris (HCC)  I25.118 MYOCARDIAL PERFUSION IMAGING  Cardiac Stress Test: Informed Consent Details: Physician/Practitioner Attestation; Transcribe to consent form and obtain patient signature    3. Exertional dyspnea  R06.09 ECHOCARDIOGRAM COMPLETE    MYOCARDIAL PERFUSION IMAGING    4. Sinus tachycardia  R00.0 LONG TERM MONITOR (3-14 DAYS)    5. Irregular heart beat  I49.9 LONG TERM MONITOR (3-14 DAYS)    6. Cigarette nicotine  dependence without complication  F17.210        Michael [name] is a 63 y.o. male with hypertension, hyperlipidemia, tobacco use, HCV, coronary atherosclerosis, nicotine  dependence Assessment & Plan Coronary atherosclerosis: Intermittent chest pain of unclear etiology. Risk factors include smoking, hypertension, and family history.  Coronary and aortic atherosclerosis noted on CT chest in 11/2022.   Recommend echocardiogram, Lexiscan nuclear stress test.   Prescribe aspirin  81 mg daily, sublingual nitroglycerin  for as needed use.   Continue Lipitor 10 mg daily. Patient knows to call 911 in case of severe chest pain or shortness of breath symptoms.    Hypertension: Well-controlled with current medications. - Continue amlodipine , hydralazine , lisinopril , and torsemide.  Nicotine  dependence: Tobacco cessation counseling: - Currently smoking 1/2 packs/day   - Patient was informed of the dangers of tobacco abuse including stroke, cancer, and MI, as well as benefits of tobacco cessation. - Patient is willing to quit at this time. - Approximately 5 mins were spent counseling patient cessation techniques. We discussed various methods to help quit smoking, including deciding on a date to quit, joining a support group, pharmacological agents. Patient would like to use Vareniciline. - I will reassess his progress at the next follow-up visit  Sinus tachycardia: Noted on EKG today.  Exertional dyspnea raising suspicion of possible cardiomyopathy. Recommend 2-week ZIO monitor.    Informed Consent   Shared Decision Making/Informed Consent The risks [chest pain, shortness of breath, cardiac arrhythmias, dizziness, blood pressure fluctuations, myocardial infarction, stroke/transient ischemic attack, nausea, vomiting, allergic reaction, radiation exposure, metallic taste sensation and life-threatening complications (estimated to be 1 in 10,000)], benefits (risk stratification, diagnosing coronary artery disease, treatment guidance) and alternatives of a  nuclear stress test were discussed in detail with Michael [name] and he agrees to proceed.       Meds ordered this encounter  Medications   aspirin  EC 81 MG tablet    Sig: Take 1 tablet (81 mg total) by mouth daily. Swallow whole.    Dispense:  90 tablet    Refill:  3   nitroGLYCERIN  (NITROSTAT ) 0.4 MG SL tablet    Sig: Place 1 tablet (0.4 mg total) under the tongue every 5 (five) minutes as needed for chest pain.    Dispense:  25 tablet    Refill:  1     F/u in 4-6 weeks  Signed, Michael [name], MD

## 2024-01-30 NOTE — Progress Notes (Unsigned)
 Applied a 14 day Zio XT monitor to patient in the office ?

## 2024-01-30 NOTE — Patient Instructions (Addendum)
 4-5Medication Instructions:  START Asprin 81 mg daily  START Sublingual Nitroglycerin  as needed for chest pain  *If you need a refill on your cardiac medications before your next appointment, please call your pharmacy*  Lab Work: NONE If you have labs (blood work) drawn today and your tests are completely normal, you will receive your results only by: MyChart Message (if you have MyChart) OR A paper copy in the mail If you have any lab test that is abnormal or we need to change your treatment, we will call you to review the results.  Testing/Procedures: 2 Week Zio Heart Monitor  Your physician has requested that you wear a Zio heart monitor for 14 days. This will be mailed to your home with instructions on how to apply the monitor and how to return it when finished. Please allow 2 weeks after returning the heart monitor before our office calls you with the results.   Lexiscan Myoview Stress Test  Your physician has requested that you have a lexiscan myoview. For further information please visit [email or web address] Please follow instruction sheet, as given.  ECHOCARDIOGRAM Your physician has requested that you have an echocardiogram. Echocardiography is a painless test that uses sound waves to create images of your heart. It provides your doctor with information about the size and shape of your heart and how well your heart's chambers and valves are working. This procedure takes approximately one hour. There are no restrictions for this procedure. Please do NOT wear cologne, perfume, aftershave, or lotions (deodorant is allowed). Please arrive 15 minutes prior to your appointment time.  Please note: We ask at that you not bring children with you during ultrasound (echo/ vascular) testing. Due to room size and safety concerns, children are not allowed in the ultrasound rooms during exams. Our front office staff cannot provide observation of children in our lobby area while testing is being  conducted. An adult accompanying a patient to their appointment will only be allowed in the ultrasound room at the discretion of the ultrasound technician under special circumstances. We apologize for any inconvenience.    Follow-Up: At [facility], you and your health needs are our priority.  As part of our continuing mission to provide you with exceptional heart care, our providers are all part of one team.  This team includes your primary Cardiologist (physician) and Advanced Practice Providers or APPs (Physician Assistants and Nurse Practitioners) who all work together to provide you with the care you need, when you need it.  Your next appointment:   4-6 Weeks   Provider:   One of our Advanced Practice Providers (APPs): Morse Clause, PA-C  Lamarr Satterfield, NP Miriam Shams, NP  Olivia Pavy, PA-C Josefa Beauvais, NP  Leontine Salen, PA-C Orren Fabry, PA-C  [place], PA-C Ernest Dick, NP  Damien Braver, NP Jon Hails, PA-C  Waddell Donath, PA-C    Dayna Dunn, PA-C  Linnie Weaver, PA-C Lum Louis, NP Katlyn West, NP Callie Goodrich, PA-C  Evan Williams, PA-C Sheng Haley, PA-C  Xika Zhao, NP Kathleen Johnson, PA-C    or Dr. Elmira    We recommend signing up for the patient portal called MyChart.  Sign up information is provided on this After Visit Summary.  MyChart is used to connect with patients for Virtual Visits (Telemedicine).  Patients are able to view lab/test results, encounter notes, upcoming appointments, etc.  Non-urgent messages can be sent to your provider as well.   To learn more about what you can do  with MyChart, go to [email or web address].   Other Instructions Your physician has requested that you have a lexiscan myoview. For further information please visit [email or web address]   How to Prepare for Your Myoview Test:        A. Nothing to eat or drink 3 hours prior to arrival time, except you may drink water        B. No  Caffeine/Decaffeinated products or chocolate 12 hours prior to arrival.       C. No Cologne or Lotion       D. Wear comfortable walking shoes       E. Total time is 3 to 4 hours; you may want to bring reading material for the waiting time. If someone comes with you, they will need to remain in the lobby due to           limited space in the testing area.        F. Please report to [address] for your test.        G. Medication Instructions:   After You Arrive:  Once you arrive in the Nuclear Cardiology lab, an IV will be started, then the Technologist will inject a small amount of radioactive tracer. There will be a 1 hour waiting period after this injection. A series of pictures will be taken of your heart following this waiting period. You will be prepped for the stress portion of the test. During the stress portion of your test a small amount of radioactive tracer will be injected in your IV. After the stress portion, there is a short rest period during which time your heart and blood pressure will be monitored. After the short test period the Technologist will begin your second set of pictures. Your doctor will inform you of your test results within 7 days.  In preparation for your appointment, medication, and supplies will be purchased. Appointment availability is limited, so if you need to cancel or reschedule please call the Nuclear Department at [phone], 24 hours in advance to avoid a cancellation fee of $50.00

## 2024-02-04 ENCOUNTER — Other Ambulatory Visit: Payer: Self-pay | Admitting: Cardiology

## 2024-02-04 DIAGNOSIS — I25118 Atherosclerotic heart disease of native coronary artery with other forms of angina pectoris: Secondary | ICD-10-CM

## 2024-02-04 DIAGNOSIS — R0609 Other forms of dyspnea: Secondary | ICD-10-CM

## 2024-02-06 ENCOUNTER — Ambulatory Visit (HOSPITAL_COMMUNITY)
Admission: RE | Admit: 2024-02-06 | Discharge: 2024-02-06 | Disposition: A | Discharge status: Home / Self Care | Source: Ambulatory Visit | Attending: Cardiology | Admitting: Cardiology

## 2024-02-06 DIAGNOSIS — I25118 Atherosclerotic heart disease of native coronary artery with other forms of angina pectoris: Secondary | ICD-10-CM | POA: Diagnosis present

## 2024-02-06 DIAGNOSIS — R0609 Other forms of dyspnea: Secondary | ICD-10-CM | POA: Diagnosis present

## 2024-02-06 LAB — MYOCARDIAL PERFUSION IMAGING
LV dias vol: 119 mL (ref 62–150)
LV sys vol: 37 mL (ref 4.2–5.8)
Nuc Stress EF: 69 %
Peak HR: 117 {beats}/min
Rest HR: 106 {beats}/min
Rest Nuclear Isotope Dose: 12.9 mCi
SDS: 0
SRS: 0
SSS: 0
ST Depression (mm): 0 mm
Stress Nuclear Isotope Dose: 37 mCi
TID: 1.18

## 2024-02-06 MED ORDER — TECHNETIUM TC 99M TETROFOSMIN IV KIT
37.0000 | PACK | Freq: Once | INTRAVENOUS | Status: AC | PRN
  Administered 2024-02-06: 37 via INTRAVENOUS

## 2024-02-06 MED ORDER — REGADENOSON 0.4 MG/5ML IV SOLN
INTRAVENOUS | Status: AC
  Filled 2024-02-06: qty 5

## 2024-02-06 MED ORDER — TECHNETIUM TC 99M TETROFOSMIN IV KIT
12.9000 | PACK | Freq: Once | INTRAVENOUS | Status: AC | PRN
Start: 2024-02-06 — End: 2024-02-06
  Administered 2024-02-06: 12.9 via INTRAVENOUS

## 2024-02-06 MED ORDER — REGADENOSON 0.4 MG/5ML IV SOLN
0.4000 mg | Freq: Once | INTRAVENOUS | Status: AC
  Administered 2024-02-06: 0.4 mg via INTRAVENOUS

## 2024-02-11 ENCOUNTER — Ambulatory Visit: Payer: Self-pay | Admitting: Cardiology

## 2024-02-23 DIAGNOSIS — I499 Cardiac arrhythmia, unspecified: Secondary | ICD-10-CM | POA: Diagnosis not present

## 2024-02-23 DIAGNOSIS — R000 Tachycardia, unspecified: Secondary | ICD-10-CM | POA: Diagnosis not present

## 2024-02-23 NOTE — Progress Notes (Signed)
 Several episodes of rapid heartbeat, primarily from top chamber of the heart.  Recommend vagal maneuvers.  In addition, if symptoms are significant, recommend starting metoprolol tartrate 25 mg twice daily.  Will await echocardiogram results.  Thanks MJP

## 2024-03-03 ENCOUNTER — Ambulatory Visit (HOSPITAL_COMMUNITY)
Admission: RE | Admit: 2024-03-03 | Discharge: 2024-03-03 | Disposition: A | Discharge status: Home / Self Care | Source: Ambulatory Visit | Attending: Cardiology | Admitting: Cardiology

## 2024-03-03 DIAGNOSIS — R0609 Other forms of dyspnea: Secondary | ICD-10-CM | POA: Insufficient documentation

## 2024-03-03 DIAGNOSIS — R0600 Dyspnea, unspecified: Secondary | ICD-10-CM

## 2024-03-03 LAB — ECHOCARDIOGRAM COMPLETE
Area-P 1/2: 3.81 cm2
S' Lateral: 2.8 cm

## 2024-03-03 NOTE — Progress Notes (Unsigned)
 Cardiology Office Note:  .   Date:  03/03/2024  ID:  Michael [name], DOB December 19, 1960, MRN [id number] PCP: Patient, No Pcp Per  [facility] Providers Cardiologist:  Newman Lawrence, MD PCP: Patient, No Pcp Per  No chief complaint on file.    Michael [name] is a 63 y.o. male with hypertension, hyperlipidemia, tobacco use, HCV, coronary atherosclerosis, nicotine  dependence  Discussed the use of AI scribe software for clinical note transcription with the patient, who gave verbal consent to proceed.  History of Present Illness Michael [name] is a 63 year old male with hypertension who presents with fatigue and shortness of breath.  He experiences persistent fatigue for the past year and shortness of breath, particularly when walking, which improves with rest. He was prescribed an inhaler but finds it ineffective and rarely uses it. There is no orthopnea or paroxysmal nocturnal dyspnea.  He restarted smoking in February and currently smokes about half a pack a day. He has a history of high blood pressure, generally well-controlled with amlodipine , hydralazine , lisinopril , and torsemide.  He notes swelling in both legs, particularly noticeable during a recent trip. He has been checked for blood clots in the past due to leg swelling and pain.  He mentions occasional chest pain, attributed to indigestion, lasting only seconds. He has a family history of heart disease, with his mother dying of a heart attack at 37 and a brother with blood clots.  He has experienced rapid heartbeats and occasional palpitations. A CT scan last year showed some plaque buildup on his aorta and coronary arteries.      There were no vitals filed for this visit.     Review of Systems  Cardiovascular:  Positive for chest pain and dyspnea on exertion. Negative for leg swelling, palpitations and syncope.        Studies Reviewed: SABRA        EKG 01/30/2024: Sinus tachycardia 109 bpm with  Premature atrial complexes When compared with ECG of 05-Feb-2023 23:46,  rate is faster    Echocardiogram 02/2024: 1. Left ventricular ejection fraction, by estimation, is 60 to 65%. The left ventricle has normal function. The left ventricle has no regional wall motion abnormalities. Left ventricular diastolic parameters were normal. The average left ventricular  global longitudinal strain is -26.0 %. The global longitudinal strain is normal.  2. Right ventricular systolic function is normal. The right ventricular size is normal.  3. The mitral valve is normal in structure. Mild mitral valve regurgitation. No evidence of mitral stenosis.  4. The aortic valve is normal in structure. Aortic valve regurgitation is not visualized. No aortic stenosis is present.  5. The inferior vena cava is normal in size with greater than 50% respiratory variability, suggesting right atrial pressure of 3 mmHg.  Stress test 01/2024:   The study is normal. The study is low risk.   LV perfusion is normal. There is no evidence of ischemia. There is no evidence of infarction.   Left ventricular function is normal. Nuclear stress EF: 69%. The left ventricular ejection fraction is hyperdynamic (>65%). End diastolic cavity size is normal.   Coronary calcium  was present on the attenuation correction CT images. Moderate coronary calcifications were present. Coronary calcifications were present in the left circumflex artery and right coronary artery distribution(s).   Zio patch monitor 13 days 01/30/2024 - 02/13/2024: Dominant rhythm: Sinus. HR 44-136 bpm. Avg HR 88 bpm, in sinus rhythm. 249 episodes of SVT/atrial tachycardia, fastest at 245 bpm  for 4 beats, longest for 22.2 at 140 bpm. Supraventricular Tachycardia was detected within +/- 45 seconds of symptomatic patient event(s).  10.6% isolated SVE, 2.3% couplets, <1% triplets. 1 episode of VT, at fastest at 200 bpm for 6 beats. <1% isolated VE, couplets. No atrial  fibrillation/atrial flutter/VT/high grade AV block, sinus pause >3sec noted. 8 patient triggered events, correlated w/SVE.  CTA chest 01/2023: 1. No pulmonary embolus is identified. 2. Airway thickening is present, suggesting bronchitis or reactive airways disease. 3. Aortic, coronary, and systemic atherosclerosis. 4. Borderline prominent bilateral external iliac lymph nodes, 1.0 cm in short axis on each side, nonspecific but most likely benign/reactive. 5. Degenerative facet arthropathy at L4-5 with grade 1 anterolisthesis and mild bilateral foraminal impingement resulting at the L4-5 level. 6. Small Morgagni hernia. 7. Sigmoid colon diverticulosis.   Aortic Atherosclerosis  Physical Exam Vitals and nursing note reviewed.  Constitutional:      General: He is not in acute distress. Neck:     Vascular: No JVD.  Cardiovascular:     Rate and Rhythm: Regular rhythm. Tachycardia present.     Heart sounds: Normal heart sounds. No murmur heard. Pulmonary:     Effort: Pulmonary effort is normal.     Breath sounds: Normal breath sounds. No wheezing or rales.  Musculoskeletal:     Right lower leg: No edema.     Left lower leg: No edema.      VISIT DIAGNOSES: No diagnosis found.    Michael [name] is a 63 y.o. male with hypertension, hyperlipidemia, tobacco use, HCV, coronary atherosclerosis, nicotine  dependence Assessment & Plan Coronary atherosclerosis: Reassuring echocardiogram, Lexiscan  nuclear stress test.  (9-02/2024)/ Okay to discontinue Aspirin . Continue Lipitor 10 mg daily. ***  Hypertension: Well-controlled with current medications. - Continue amlodipine , hydralazine , lisinopril , and torsemide.  Nicotine  dependence: ***  Palpitations: Multiple episodes of atrial tachycardia/SVT. *** Noted on EKG today.  Exertional dyspnea raising suspicion of possible cardiomyopathy. Recommend 2-week ZIO monitor.    No orders of the defined types were placed in this  encounter.    F/u in 4-6 weeks  Signed, Newman [name], MD

## 2024-03-10 DIAGNOSIS — I4719 Other supraventricular tachycardia: Secondary | ICD-10-CM | POA: Insufficient documentation

## 2024-03-10 DIAGNOSIS — I471 Supraventricular tachycardia, unspecified: Secondary | ICD-10-CM | POA: Insufficient documentation

## 2024-03-11 ENCOUNTER — Encounter: Payer: Self-pay | Admitting: Cardiology

## 2024-03-11 ENCOUNTER — Ambulatory Visit: Attending: Cardiology | Admitting: Cardiology

## 2024-03-11 VITALS — BP 140/92 | HR 97 | Ht 71.0 in | Wt 268.0 lb

## 2024-03-11 DIAGNOSIS — I4719 Other supraventricular tachycardia: Secondary | ICD-10-CM | POA: Diagnosis not present

## 2024-03-11 DIAGNOSIS — I10 Essential (primary) hypertension: Secondary | ICD-10-CM | POA: Diagnosis not present

## 2024-03-11 DIAGNOSIS — I471 Supraventricular tachycardia, unspecified: Secondary | ICD-10-CM | POA: Diagnosis not present

## 2024-03-11 MED ORDER — METOPROLOL SUCCINATE ER 50 MG PO TB24: 50.0000 mg | ORAL_TABLET | Freq: Every day | ORAL | 2 refills | Status: AC

## 2024-03-11 NOTE — Patient Instructions (Signed)
 Medication Instructions:  Your physician has recommended you make the following change in your medication:  1.) start metoprolol succinate (TOPROL XL) 50 mg - take one tablet daily at bedtime  *If you need a refill on your cardiac medications before your next appointment, please call your pharmacy*  Lab Work: none  Testing/Procedures: none  Follow-Up: At [facility], you and your health needs are our priority.  As part of our continuing mission to provide you with exceptional heart care, our providers are all part of one team.  This team includes your primary Cardiologist (physician) and Advanced Practice Providers or APPs (Physician Assistants and Nurse Practitioners) who all work together to provide you with the care you need, when you need it.  Your next appointment:   3 month(s)  Provider:   One of our Advanced Practice Providers (APPs): Morse Clause, PA-C  Lamarr Satterfield, NP Miriam Shams, NP  Olivia Pavy, PA-C Josefa Beauvais, NP  Leontine Salen, PA-C Orren Fabry, PA-C  [place], PA-C Ernest Dick, NP  Damien Braver, NP Jon Hails, PA-C  Waddell Donath, PA-C    Dayna Dunn, PA-C  Braedan Weaver, PA-C Lum Louis, NP Katlyn West, NP Callie Goodrich, PA-C  Xika Zhao, NP Sheng Haley, PA-C    Kathleen Johnson, PA-C

## 2024-06-05 ENCOUNTER — Emergency Department (HOSPITAL_COMMUNITY)

## 2024-06-05 ENCOUNTER — Encounter (HOSPITAL_COMMUNITY): Payer: Self-pay

## 2024-06-05 ENCOUNTER — Other Ambulatory Visit: Payer: Self-pay

## 2024-06-05 ENCOUNTER — Emergency Department (HOSPITAL_COMMUNITY)
Admission: EM | Admit: 2024-06-05 | Discharge: 2024-06-05 | Disposition: A | Discharge status: Home / Self Care | Attending: Emergency Medicine | Admitting: Emergency Medicine

## 2024-06-05 DIAGNOSIS — L03113 Cellulitis of right upper limb: Secondary | ICD-10-CM | POA: Insufficient documentation

## 2024-06-05 DIAGNOSIS — Z7982 Long term (current) use of aspirin: Secondary | ICD-10-CM | POA: Diagnosis not present

## 2024-06-05 DIAGNOSIS — M79641 Pain in right hand: Secondary | ICD-10-CM | POA: Diagnosis present

## 2024-06-05 LAB — BASIC METABOLIC PANEL WITH GFR
Anion gap: 10 (ref 5–15)
BUN: 10 mg/dL (ref 8–23)
CO2: 29 mmol/L (ref 22–32)
Calcium: 9.1 mg/dL (ref 8.9–10.3)
Chloride: 100 mmol/L (ref 98–111)
Creatinine, Ser: 0.93 mg/dL (ref 0.61–1.24)
GFR, Estimated: >60 mL/min
Glucose, Bld: 120 mg/dL — ABNORMAL HIGH (ref 70–99)
Potassium: 3.7 mmol/L (ref 3.5–5.1)
Sodium: 139 mmol/L (ref 135–145)

## 2024-06-05 LAB — CBC WITH DIFFERENTIAL/PLATELET
Abs Immature Granulocytes: 0.05 K/uL (ref 0.00–0.07)
Basophils Absolute: 0.1 K/uL (ref 0.0–0.1)
Basophils Relative: 1 %
Eosinophils Absolute: 0.1 K/uL (ref 0.0–0.5)
Eosinophils Relative: 2 %
HCT: 46.4 % (ref 39.0–52.0)
Hemoglobin: 15.7 g/dL (ref 13.0–17.0)
Immature Granulocytes: 1 %
Lymphocytes Relative: 24 %
Lymphs Abs: 2.3 K/uL (ref 0.7–4.0)
MCH: 30.3 pg (ref 26.0–34.0)
MCHC: 33.8 g/dL (ref 30.0–36.0)
MCV: 89.4 fL (ref 80.0–100.0)
Monocytes Absolute: 0.7 K/uL (ref 0.1–1.0)
Monocytes Relative: 7 %
Neutro Abs: 6.2 K/uL (ref 1.7–7.7)
Neutrophils Relative %: 65 %
Platelets: 356 K/uL (ref 150–400)
RBC: 5.19 MIL/uL (ref 4.22–5.81)
RDW: 14.4 % (ref 11.5–15.5)
WBC: 9.4 K/uL (ref 4.0–10.5)
nRBC: 0 % (ref 0.0–0.2)

## 2024-06-05 MED ORDER — DOXYCYCLINE HYCLATE 100 MG PO CAPS: 100.0000 mg | ORAL_CAPSULE | Freq: Two times a day (BID) | ORAL | 0 refills | Status: AC

## 2024-06-05 NOTE — Discharge Instructions (Addendum)
 Keep your hand elevated is much as you are able to to help with swelling.  You can also use an Ace wrap to help decrease swelling.  Take pictures of your hand daily so you can track progression.  Schedule appoint with primary care doctor next week if you are able to see them Wednesday or Thursday that would be ideal.  Take Tylenol  1000 mg every 6 hours as needed for pain.  I have also written your prescription for doxycycline  to take as prescribed.

## 2024-06-05 NOTE — ED Provider Triage Note (Signed)
 Emergency Medicine Provider Triage Evaluation Note  JAVIEN TESCH , a 64 y.o. male  was evaluated in triage.  Pt complains of right hand swelling after superficial abrasion.  Review of Systems  Positive: Erythema, redness, swelling, pain, fatigue Negative: Fever, nausea, vomiting, numbness, weakness  Physical Exam  BP (!) 160/104   Pulse 83   Temp 98.5 F (36.9 C)   Resp 19   Ht 5' 11 (1.803 m)   Wt 117.9 kg   SpO2 95%   BMI 36.26 kg/m  Gen:   Awake, no distress   Resp:  Normal effort  MSK:   Moves extremities without difficulty, swelling and erythema noted to right dorsum of hand.  Patient is neurovascularly intact with +2 radial pulses. Other:    Medical Decision Making  Medically screening exam initiated at 11:28 AM.  Appropriate orders placed.  [name] was informed that the remainder of the evaluation will be completed by another provider, this initial triage assessment does not replace that evaluation, and the importance of remaining in the ED until their evaluation is complete.  Labs and imaging ordered   Francis Ileana LOISE DEVONNA 06/05/24 1129

## 2024-06-05 NOTE — ED Provider Notes (Signed)
 " [place] EMERGENCY DEPARTMENT AT [facility] Provider Note   CSN: [id number] Arrival date & time: 06/05/24  1117     Patient presents with: Hand Pain   Michael [name] is a 64 y.o. male.    Hand Pain   Patient with right hand pain and swelling for the past 3 to 4 days.  He remembers suffering an abrasion on the dorsum of his right hand and had some swelling and puffiness after that which has gradually worsened.  He denies any fall or trauma.  No fevers chills nausea vomiting denies any weakness or myalgias.      Prior to Admission medications  Medication Sig Start Date End Date Taking? Authorizing Provider  doxycycline  (VIBRAMYCIN ) 100 MG capsule Take 1 capsule (100 mg total) by mouth 2 (two) times daily for 7 days. 06/05/24 06/12/24 Yes Hero Kulish S, PA  albuterol  (VENTOLIN  HFA) 108 (90 Base) MCG/ACT inhaler Inhale 2 puffs into the lungs every 6 (six) hours as needed. 11/17/23   [provider]  amLODipine  (NORVASC ) 10 MG tablet Take 1 tablet (10 mg total) by mouth daily. 12/17/22   Vicci Barnie NOVAK, MD  aspirin  EC 81 MG tablet Take 1 tablet (81 mg total) by mouth daily. Swallow whole. 01/30/24   Patwardhan, Newman PARAS, MD  atorvastatin  (LIPITOR) 20 MG tablet Take 1 tablet (20 mg total) by mouth daily. 12/17/22   Vicci Barnie NOVAK, MD  hydrALAZINE  (APRESOLINE ) 25 MG tablet TAKE 1 TABLET BY MOUTH TWICE A DAY 06/09/23   Vicci Barnie NOVAK, MD  lisinopril  (ZESTRIL ) 20 MG tablet Take 20 mg by mouth. Patient taking differently: Take 20 mg by mouth daily. 09/05/23   [provider]  metoprolol  succinate (TOPROL -XL) 50 MG 24 hr tablet Take 1 tablet (50 mg total) by mouth at bedtime. Take with or immediately following a meal. 03/11/24   Patwardhan, Manish J, MD  nitroGLYCERIN  (NITROSTAT ) 0.4 MG SL tablet Place 1 tablet (0.4 mg total) under the tongue every 5 (five) minutes as needed for chest pain. 01/30/24   Patwardhan, Manish J, MD  psyllium (REGULOID) 0.52 g  capsule Take 0.52 g by mouth daily.    [provider]  torsemide (DEMADEX) 10 MG tablet Take 10 mg by mouth. 09/05/23   [provider]  carvedilol  (COREG ) 6.25 MG tablet Take 1 tablet (6.25 mg total) by mouth 2 (two) times daily with a meal. 06/10/19 06/17/19  Vicci Barnie NOVAK, MD    Allergies: Patient has no known allergies.    Review of Systems  Updated Vital Signs BP (!) 151/90 (BP Location: Left Arm)   Pulse 63   Temp 98 F (36.7 C) (Oral)   Resp 18   Ht 5' 11 (1.803 m)   Wt 117.9 kg   SpO2 96%   BMI 36.26 kg/m   Physical Exam Vitals and nursing note reviewed.  Constitutional:      General: He is not in acute distress. HENT:     Head: Normocephalic and atraumatic.     Nose: Nose normal.  Eyes:     General: No scleral icterus. Cardiovascular:     Rate and Rhythm: Normal rate and regular rhythm.     Pulses: Normal pulses.     Heart sounds: Normal heart sounds.  Pulmonary:     Effort: Pulmonary effort is normal. No respiratory distress.     Breath sounds: No wheezing.  Abdominal:     Palpations: Abdomen is soft.  Tenderness: There is no abdominal tenderness.  Musculoskeletal:     Cervical back: Normal range of motion.     Right lower leg: No edema.     Left lower leg: No edema.     Comments: Right upper extremity with edema to right upper extremity limited to the dorsum of the hand.  There is some swelling in the digits no fusiform swelling around finger or flexor tendon tenderness.  No significant erythema of arm.  No lacerations or wounds or abscesses  Skin:    General: Skin is warm and dry.     Capillary Refill: Capillary refill takes less than 2 seconds.  Neurological:     Mental Status: He is alert. Mental status is at baseline.  Psychiatric:        Mood and Affect: Mood normal.        Behavior: Behavior normal.     (all labs ordered are listed, but only abnormal results are displayed) Labs Reviewed  BASIC METABOLIC PANEL WITH GFR  - Abnormal; Notable for the following components:      Result Value   Glucose, Bld 120 (*)    All other components within normal limits  CBC WITH DIFFERENTIAL/PLATELET    EKG: None  Radiology: DG Hand Complete Right Result Date: 06/05/2024 CLINICAL DATA:  Injury while taking down a fence. Swelling along proximal posterior right hand with a lump. EXAM: RIGHT HAND - COMPLETE 3+ VIEW COMPARISON:  None. FINDINGS: Diffuse dorsal soft tissue swelling along the hand, wrist and proximal forearm. No radiopaque foreign body. Note is made that would may be fairly radiolucent. No soft tissue gas. No underlying osseous abnormality. IMPRESSION: Extensive dorsal soft tissue swelling. No radiopaque foreign body or soft tissue gas. Electronically Signed   By: Newell Eke M.D.   On: 06/05/2024 12:15     Procedures   Medications Ordered in the ED - No data to display                                  Medical Decision Making  Patient with right hand pain and swelling for the past 3 to 4 days.  He remembers suffering an abrasion on the dorsum of his right hand and had some swelling and puffiness after that which has gradually worsened.  He denies any fall or trauma.  No fevers chills nausea vomiting denies any weakness or myalgias.   Patient is exam is notable for edematous right hand.  Distally neurovascularly intact.  Will start on doxycycline  and recommend elevation of hand and follow-up closely with primary care/return to ER for any new or concerning symptoms would like to have him be seen at 4 to 5 days from now if things are gradually improving.  X-ray without any evidence of osteomyelitis or free air, CBC without leukocytosis or anemia.  Patient discharged home on doxycycline .   Final diagnoses:  Cellulitis of right hand    ED Discharge Orders          Ordered    doxycycline  (VIBRAMYCIN ) 100 MG capsule  2 times daily        06/05/24 1317               Neldon Inoue Alma,  [place] 06/05/24 1352  "

## 2024-06-05 NOTE — ED Triage Notes (Signed)
 Pt POV d/t right hand swelling and pian d/t working outside last week and possibly getting a staple puncture.  No redness noted.

## 2024-06-07 ENCOUNTER — Encounter: Payer: Self-pay | Admitting: Emergency Medicine

## 2024-06-07 ENCOUNTER — Ambulatory Visit: Attending: Cardiology | Admitting: Emergency Medicine

## 2024-06-07 VITALS — BP 132/80 | HR 79 | Ht 71.0 in | Wt 265.0 lb

## 2024-06-07 DIAGNOSIS — I10 Essential (primary) hypertension: Secondary | ICD-10-CM

## 2024-06-07 DIAGNOSIS — F17210 Nicotine dependence, cigarettes, uncomplicated: Secondary | ICD-10-CM | POA: Diagnosis not present

## 2024-06-07 DIAGNOSIS — I25118 Atherosclerotic heart disease of native coronary artery with other forms of angina pectoris: Secondary | ICD-10-CM | POA: Diagnosis not present

## 2024-06-07 DIAGNOSIS — G4730 Sleep apnea, unspecified: Secondary | ICD-10-CM

## 2024-06-07 DIAGNOSIS — E785 Hyperlipidemia, unspecified: Secondary | ICD-10-CM | POA: Diagnosis not present

## 2024-06-07 DIAGNOSIS — I4719 Other supraventricular tachycardia: Secondary | ICD-10-CM | POA: Diagnosis not present

## 2024-06-07 NOTE — Progress Notes (Signed)
 " Cardiology Office Note:    Date:  06/07/2024  ID:  Michael [name], DOB 10-Jun-1960, MRN [id number] PCP: Venson Candis CROME, NP  [facility] Providers Cardiologist:  Newman [name], MD Cardiology APP:  Rana Lum CROME, NP       Patient Profile:       Chief Complaint: 29-month follow-up History of Present Illness:  Michael [name] is a 64 y.o. male with visit-pertinent history of hypertension, hyperlipidemia, tobacco use, HCV, coronary atherosclerosis, nicotine  dependence  Patient established with cardiology service on 01/30/2024 with Dr. Lawrence for fatigue, shortness of breath, chest pains.  Lexiscan  Myoview  02/06/2024 was normal low risk study without evidence of ischemia or infarction.  Zio 01/2024 showed 249 episodes of SVT/atrial tachycardia, fastest at 245 bpm for 4 beats and longest for 22.2 seconds at 140 bpm, 10% isolated SVE.  Echocardiogram today 25 showed LVEF 60 to 65%, no RWMA, normal diastolic parameters, RV function and size normal, mild mitral valve regurgitation.  Last seen in clinic on 03/11/2024.  Metoprolol  was added for management of atrial tachycardia/PSVT.   Discussed the use of AI scribe software for clinical note transcription with the patient, who gave verbal consent to proceed.  History of Present Illness Michael [name] is a 64 year old male with coronary disease and hypertension who presents for a three-month follow-up.  Today he is doing well without cardiovascular complaints.  He is without chest discomfort or dyspnea on exertion.  Remains fairly active without exertional symptoms.  He has hypertension treated with amlodipine , hydralazine , and lisinopril . He checks his blood pressure at home and notes fluctuations but overall stable and normal.  He has atrial tachycardia, he had palpitations previously but reports no recent episodes since starting metoprolol .  He reports his palpitations are well-controlled.  He smokes 5 to 10  cigarettes per day and is trying to quit. He previously quit for a year and is considering medication to assist with cessation.  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 Pain limits his ability to exercise at prior levels.  He has not been evaluated for sleep apnea but notes loud snoring and waking up tired.  He denies chest pains, dyspnea, syncope, presyncope, lightheadedness, dizziness   Review of systems:  Please see the history of present illness. All other systems are reviewed and otherwise negative.      Studies Reviewed:    EKG Interpretation Date/Time:  Monday June 07 2024 08:07:32 EST Ventricular Rate:  81 PR Interval:  168 QRS Duration:  76 QT Interval:  350 QTC Calculation: 406 R Axis:   24  Text Interpretation: Sinus rhythm with Premature atrial complexes When compared with ECG of 30-Jan-2024 11:19, No significant change since last tracing Confirmed by Rana Lum [phone]) on 06/07/2024 9:15:49 AM    Echocardiogram 03/03/2024  1. Left ventricular ejection fraction, by estimation, is 60 to 65%. The  left ventricle has normal function. The left ventricle has no regional  wall motion abnormalities. Left ventricular diastolic parameters were  normal. The average left ventricular  global longitudinal strain is -26.0 %. The global longitudinal strain is  normal.   2. Right ventricular systolic function is normal. The right ventricular  size is normal.   3. The mitral valve is normal in structure. Mild mitral valve  regurgitation. No evidence of mitral stenosis.   4. The aortic valve is normal in structure. Aortic valve regurgitation is  not visualized. No aortic stenosis is present.   5. The inferior vena cava is normal in size with greater than 50%  respiratory variability, suggesting right atrial pressure of 3 mmHg.   Zio 01/30/2024 Zio patch monitor 13 days 01/30/2024 -  02/13/2024: Dominant rhythm: Sinus. HR 44-136 bpm. Avg HR 88 bpm, in sinus rhythm. 249 episodes of SVT/atrial tachycardia, fastest at 245 bpm for 4 beats, longest for 22.2 at 140 bpm. Supraventricular Tachycardia was detected within +/- 45 seconds of symptomatic patient event(s).  10.6% isolated SVE, 2.3% couplets, <1% triplets. 1 episode of VT, at fastest at 200 bpm for 6 beats. <1% isolated VE, couplets. No atrial fibrillation/atrial flutter/VT/high grade AV block, sinus pause >3sec noted. 8 patient triggered events, correlated w/SVE.  Lexiscan  Myoview  02/06/2024   The study is normal. The study is low risk.   LV perfusion is normal. There is no evidence of ischemia. There is no evidence of infarction.   Left ventricular function is normal. Nuclear stress EF: 69%. The left ventricular ejection fraction is hyperdynamic (>65%). End diastolic cavity size is normal.   Coronary calcium  was present on the attenuation correction CT images. Moderate coronary calcifications were present. Coronary calcifications were present in the left circumflex artery and right coronary artery distribution(s).   Risk Assessment/Calculations:         STOP-Bang Score:  6       Physical Exam:   VS:  BP 132/80 (BP Location: Left Arm, Patient Position: Sitting, Cuff Size: Normal)   Pulse 79   Ht 5' 11 (1.803 m)   Wt 265 lb (120.2 kg)   BMI 36.96 kg/m    Wt Readings from Last 3 Encounters:  06/07/24 265 lb (120.2 kg)  06/05/24 260 lb (117.9 kg)  03/11/24 268 lb (121.6 kg)    GEN: Well nourished, well developed in no acute distress NECK: No JVD; No carotid bruits CARDIAC: RRR, no murmurs, rubs, gallops RESPIRATORY:  Clear to auscultation without rales, wheezing or rhonchi  ABDOMEN: Soft, non-tender, non-distended EXTREMITIES: No lower extremity edema; No acute deformity      Assessment and Plan:  Coronary artery disease Lexiscan  Myoview  01/2024 was a low risk study without evidence of ischemia or  infarction with moderate coronary calcifications present in the LCx and RCA - EKG today without acute ischemic features - Today patient is stable without exertional symptoms.  He is without symptoms suggestive of angina.  No indication for ischemic evaluation at this time - Continue atorvastatin  20 mg daily  Hypertension Blood pressure today is 132/80 and well-controlled - Continue amlodipine  10 mg daily, hydralazine  25 mg twice daily, lisinopril  20 mg daily, and metoprolol  succinate 50 mg daily - Continue monitoring BP at home  Atrial tachycardia Zio 01/2024 showed 249 episodes of SVT/atrial tachycardia, fastest at 245 bpm for 4 beats, longest 22.2 seconds at 140 bpm with 10% isolated SVE burden - Quiescent and well-controlled since starting on beta-blocking therapy - Encouraged weight loss, stress reduction, and decreasing caffeine use - Will screen for obstructive sleep apnea - Continue metoprolol  succinate 50 mg daily  Nicotine  dependence Currently smoking 5 to 10 cigarettes/day - Encouraged full tobacco cessation  Hyperlipidemia, LDL goal <70 LDL 79 on 11/2022 - Plan for repeat fasting lipid panel today.  Close to goal can consider increasing atorvastatin  if needed - Continue atorvastatin  20 mg daily   Sleep disordered breathing STOP-BANG score of 6 - Plan for itamar home sleep study       Dispo:  Return  in about 6 months (around 12/05/2024).  Signed, Lum LITTIE Louis, NP  "

## 2024-06-07 NOTE — Patient Instructions (Signed)
 Medication Instructions:  NO CHANGES   Lab Work: FASTING LIPID PANEL AND CMET TO BE DONE TODAY.  Testing/Procedures: WatchPAT? is an FDA-cleared portable home sleep study test that uses a watch and 3 points of contact to monitor 7 different channels, including your heart rate, oxygen saturation, body position, snoring, and chest motion.  The study is easy to use from the comfort of your own home and accurately detect sleep apnea.  Before bed, you attach the chest sensor, attached the sleep apnea bracelet to your nondominant hand, and attach the finger probe.  After the study, the raw data is downloaded from the watch and scored for apnea events.   For more information: [email or web address]  Patient Testing Instructions:  Once our office has received insurance approval for you to complete this test, we will contact you with a PIN to activate the device.  This typically takes 2-3 weeks.  Please do not open the box until approved.   Do not put battery into the device until bedtime when you are ready to begin the test. Please call the support number if you need assistance after following the instructions below: 24 hour support line- [phone] or [facility] support at [phone] (option 2)  Download the [facility] WatchPAT One app through the [employer] or [employer]. Be sure to turn on or enable access to Bluetooth in settings on your smartphone. Make sure no other Bluetooth devices are on and within the vicinity of your smartphone and WatchPAT watch during testing.  Make sure to leave your smart phone plugged in and charging all night.  When ready for bed:  Follow the instructions step by step in the WatchPAT One app to activate the testing device. For additional instructions, including video instruction, visit the WatchPAT One video on Youtube. You can search for WatchPAT One within Youtube (video is 4 minutes and 18 seconds) or enter:  https://youtube/[email or web address]?[email or web address]_vbiwxE Please note: You will be prompted to enter a PIN to connect via Bluetooth when starting the test. The PIN will be assigned to you after insurance has approved the test.  The device is disposable, but it recommended that you retain the device until you receive a call letting you know the study has been received and the results have been interpreted.  We will let you know if the study did not transmit to us  properly after the test is completed. You do not need to call us  to confirm the receipt of the test.  Please complete the test within 48 hours of receiving PIN.   Frequently Asked Questions:  What is Watch PAT One?  A single use, fully disposable home sleep apnea testing device and will not need to be returned after completion.  What are the requirements to use WatchPAT One?  A successful WatchPAT One sleep study requires a WatchPAT One device, your smart phone, WatchPAT One app, your PIN number, and internet access. What type of phone do I need?  You should have a smart phone that uses Android 5.1 and above or any iPhone with IOS 10 and above. How can I download the WatchPAT one app?  Based on your device type search for WatchPAT One app either in [employer] for [employer] or [employer] for [employer]. Where will I get my PIN for the study?  Your PIN will be provided by your physician's office after insurance has approved the test. This process typically takes 2-3 weeks. It is used for authentication and if you lose/forget  your PIN, please reach out to your provider's office.  I do not have internet at home. Can I still complete a WatchPAT One study?  WatchPAT One needs internet connection throughout the night to be able to transmit the sleep data. You can use your home/local internet or your cellular data package. However, it is always recommended to use home/local internet. It is estimated that between 20MB-30MB of data will be used with each  study, but the application will be looking for space in the phone to start the study.  What happens if I lose internet or Bluetooth connection?  During the internet disconnection, your phone will not be able to transmit the sleep data.  All the data, will be stored in your phone.  As soon as the internet connection is back on, the phone will resume sending the sleep data. During the Bluetooth disconnection, WatchPAT One will not be able to to send the sleep data to your phone.  Data will be kept in the WatchPAT One until both devices have Bluetooth connection back on.  As soon as the connection is back on, WatchPAT one will send the sleep data to the phone.  How long do I need to wear the WatchPAT one?  After you start the study, you should wear the device at least 6 hours.  How far should I keep my phone from the device?  During the night, your phone should remain within 15 feet of where you sleep.  What happens if I leave the room for restroom or other reasons?  Leaving the room for any reason will not cause any problem. As soon as your get back to the room, both devices will reconnect and will continue to send the sleep data. Can I use my phone during the sleep study?  Yes, you can use your phone as usual during the study. But it is recommended to put your WatchPAT One on when you are ready to go to bed.  How will I get my study results?  A soon as you completed your study, your sleep data will be sent to the provider. They will then share the results with you when they are ready.    Follow-Up: At [facility], you and your health needs are our priority.  As part of our continuing mission to provide you with exceptional heart care, our providers are all part of one team.  This team includes your primary Cardiologist (physician) and Advanced Practice Providers or APPs (Physician Assistants and Nurse Practitioners) who all work together to provide you with the care you need, when you  need it.  Your next appointment:   6 MONTHS  Provider:   Newman [name], MD OR Lum Louis, NP   We recommend signing up for the patient portal called MyChart.  Sign up information is provided on this After Visit Summary.  MyChart is used to connect with patients for Virtual Visits (Telemedicine).  Patients are able to view lab/test results, encounter notes, upcoming appointments, etc.  Non-urgent messages can be sent to your provider as well.   To learn more about what you can do with MyChart, go to [email or web address].   Other Instructions:

## 2024-06-08 LAB — COMPREHENSIVE METABOLIC PANEL WITH GFR
ALT: 19 IU/L (ref 0–44)
AST: 19 IU/L (ref 0–40)
Albumin: 4.6 g/dL (ref 3.9–4.9)
Alkaline Phosphatase: 83 IU/L (ref 47–123)
BUN/Creatinine Ratio: 16 (ref 10–24)
BUN: 13 mg/dL (ref 8–27)
Bilirubin Total: 0.5 mg/dL (ref 0.0–1.2)
CO2: 24 mmol/L (ref 20–29)
Calcium: 9.8 mg/dL (ref 8.6–10.2)
Chloride: 99 mmol/L (ref 96–106)
Creatinine, Ser: 0.82 mg/dL (ref 0.76–1.27)
Globulin, Total: 2.6 g/dL (ref 1.5–4.5)
Glucose: 86 mg/dL (ref 70–99)
Potassium: 4.2 mmol/L (ref 3.5–5.2)
Sodium: 140 mmol/L (ref 134–144)
Total Protein: 7.2 g/dL (ref 6.0–8.5)
eGFR: 99 mL/min/1.73

## 2024-06-08 LAB — LIPID PANEL
Chol/HDL Ratio: 3.1 ratio (ref 0.0–5.0)
Cholesterol, Total: 143 mg/dL (ref 100–199)
HDL: 46 mg/dL
LDL Chol Calc (NIH): 72 mg/dL (ref 0–99)
Triglycerides: 146 mg/dL (ref 0–149)
VLDL Cholesterol Cal: 25 mg/dL (ref 5–40)

## 2024-06-09 ENCOUNTER — Ambulatory Visit: Payer: Self-pay | Admitting: Emergency Medicine
